# Patient Record
Sex: Female | Born: 1950
Health system: Southern US, Community
[De-identification: ages and names within clinical notes are randomized; demographics above are authoritative.]

## PROBLEM LIST (undated history)

## (undated) DIAGNOSIS — I340 Nonrheumatic mitral (valve) insufficiency: Secondary | ICD-10-CM

## (undated) DIAGNOSIS — J449 Chronic obstructive pulmonary disease, unspecified: Secondary | ICD-10-CM

## (undated) DIAGNOSIS — I10 Essential (primary) hypertension: Secondary | ICD-10-CM

## (undated) DIAGNOSIS — M25519 Pain in unspecified shoulder: Secondary | ICD-10-CM

## (undated) DIAGNOSIS — I358 Other nonrheumatic aortic valve disorders: Secondary | ICD-10-CM

## (undated) DIAGNOSIS — I6529 Occlusion and stenosis of unspecified carotid artery: Secondary | ICD-10-CM

## (undated) DIAGNOSIS — G479 Sleep disorder, unspecified: Secondary | ICD-10-CM

## (undated) DIAGNOSIS — E782 Mixed hyperlipidemia: Secondary | ICD-10-CM

## (undated) DIAGNOSIS — R0989 Other specified symptoms and signs involving the circulatory and respiratory systems: Secondary | ICD-10-CM

## (undated) DIAGNOSIS — F419 Anxiety disorder, unspecified: Secondary | ICD-10-CM

## (undated) HISTORY — DX: Sleep disorder, unspecified: G47.9

## (undated) HISTORY — DX: Essential (primary) hypertension: I10

## (undated) HISTORY — DX: Occlusion and stenosis of unspecified carotid artery: I65.29

## (undated) HISTORY — DX: Pain in unspecified shoulder: M25.519

## (undated) HISTORY — DX: Other specified symptoms and signs involving the circulatory and respiratory systems: R09.89

## (undated) HISTORY — DX: Anxiety disorder, unspecified: F41.9

## (undated) HISTORY — DX: Other nonrheumatic aortic valve disorders: I35.8

## (undated) HISTORY — DX: Mixed hyperlipidemia: E78.2

## (undated) HISTORY — PX: CARDIAC CATHETERIZATION: SHX172

## (undated) HISTORY — PX: EYE SURGERY: SHX253

## (undated) HISTORY — PX: CATARACT EXTRACTION: SUR2

## (undated) HISTORY — DX: Nonrheumatic mitral (valve) insufficiency: I34.0

## (undated) HISTORY — PX: CHOLECYSTECTOMY: SHX55

---

## 2004-09-18 ENCOUNTER — Ambulatory Visit: Payer: Self-pay

## 2007-12-12 ENCOUNTER — Ambulatory Visit: Payer: Self-pay | Admitting: Surgery

## 2008-07-29 ENCOUNTER — Emergency Department: Payer: Self-pay | Admitting: Emergency Medicine

## 2009-01-04 ENCOUNTER — Encounter: Payer: Self-pay | Admitting: General Practice

## 2009-01-27 ENCOUNTER — Encounter: Payer: Self-pay | Admitting: General Practice

## 2009-11-03 ENCOUNTER — Ambulatory Visit: Payer: Self-pay | Admitting: Gastroenterology

## 2010-10-29 HISTORY — PX: COLONOSCOPY: SHX174

## 2014-08-11 DIAGNOSIS — R6889 Other general symptoms and signs: Secondary | ICD-10-CM | POA: Diagnosis not present

## 2014-08-11 DIAGNOSIS — F329 Major depressive disorder, single episode, unspecified: Secondary | ICD-10-CM | POA: Diagnosis not present

## 2014-08-11 DIAGNOSIS — Z23 Encounter for immunization: Secondary | ICD-10-CM | POA: Diagnosis not present

## 2014-08-11 DIAGNOSIS — F419 Anxiety disorder, unspecified: Secondary | ICD-10-CM | POA: Diagnosis not present

## 2014-08-11 DIAGNOSIS — Z1389 Encounter for screening for other disorder: Secondary | ICD-10-CM | POA: Diagnosis not present

## 2014-08-11 DIAGNOSIS — G479 Sleep disorder, unspecified: Secondary | ICD-10-CM | POA: Diagnosis not present

## 2014-08-11 DIAGNOSIS — J309 Allergic rhinitis, unspecified: Secondary | ICD-10-CM | POA: Diagnosis not present

## 2014-08-11 DIAGNOSIS — Z9181 History of falling: Secondary | ICD-10-CM | POA: Diagnosis not present

## 2014-09-01 DIAGNOSIS — Z1389 Encounter for screening for other disorder: Secondary | ICD-10-CM | POA: Diagnosis not present

## 2014-09-01 DIAGNOSIS — R6889 Other general symptoms and signs: Secondary | ICD-10-CM | POA: Diagnosis not present

## 2014-09-01 DIAGNOSIS — M25511 Pain in right shoulder: Secondary | ICD-10-CM | POA: Diagnosis not present

## 2014-09-01 DIAGNOSIS — F419 Anxiety disorder, unspecified: Secondary | ICD-10-CM | POA: Diagnosis not present

## 2014-09-01 DIAGNOSIS — F329 Major depressive disorder, single episode, unspecified: Secondary | ICD-10-CM | POA: Diagnosis not present

## 2014-09-01 DIAGNOSIS — I10 Essential (primary) hypertension: Secondary | ICD-10-CM | POA: Diagnosis not present

## 2014-09-01 DIAGNOSIS — G479 Sleep disorder, unspecified: Secondary | ICD-10-CM | POA: Diagnosis not present

## 2014-09-01 DIAGNOSIS — J069 Acute upper respiratory infection, unspecified: Secondary | ICD-10-CM | POA: Diagnosis not present

## 2014-09-21 DIAGNOSIS — R0989 Other specified symptoms and signs involving the circulatory and respiratory systems: Secondary | ICD-10-CM | POA: Diagnosis not present

## 2014-09-21 DIAGNOSIS — G479 Sleep disorder, unspecified: Secondary | ICD-10-CM | POA: Diagnosis not present

## 2014-09-21 DIAGNOSIS — F329 Major depressive disorder, single episode, unspecified: Secondary | ICD-10-CM | POA: Diagnosis not present

## 2014-09-21 DIAGNOSIS — M47812 Spondylosis without myelopathy or radiculopathy, cervical region: Secondary | ICD-10-CM | POA: Diagnosis not present

## 2014-09-21 DIAGNOSIS — I10 Essential (primary) hypertension: Secondary | ICD-10-CM | POA: Diagnosis not present

## 2014-09-21 DIAGNOSIS — K121 Other forms of stomatitis: Secondary | ICD-10-CM | POA: Diagnosis not present

## 2014-09-21 DIAGNOSIS — F419 Anxiety disorder, unspecified: Secondary | ICD-10-CM | POA: Diagnosis not present

## 2014-11-09 DIAGNOSIS — K121 Other forms of stomatitis: Secondary | ICD-10-CM | POA: Diagnosis not present

## 2014-11-09 DIAGNOSIS — I10 Essential (primary) hypertension: Secondary | ICD-10-CM | POA: Diagnosis not present

## 2014-11-16 DIAGNOSIS — M25511 Pain in right shoulder: Secondary | ICD-10-CM | POA: Diagnosis not present

## 2014-11-16 DIAGNOSIS — E782 Mixed hyperlipidemia: Secondary | ICD-10-CM | POA: Diagnosis not present

## 2014-11-16 DIAGNOSIS — F329 Major depressive disorder, single episode, unspecified: Secondary | ICD-10-CM | POA: Diagnosis not present

## 2014-11-16 DIAGNOSIS — F419 Anxiety disorder, unspecified: Secondary | ICD-10-CM | POA: Diagnosis not present

## 2014-11-16 DIAGNOSIS — I10 Essential (primary) hypertension: Secondary | ICD-10-CM | POA: Diagnosis not present

## 2014-11-16 DIAGNOSIS — R0989 Other specified symptoms and signs involving the circulatory and respiratory systems: Secondary | ICD-10-CM | POA: Diagnosis not present

## 2014-11-16 DIAGNOSIS — G479 Sleep disorder, unspecified: Secondary | ICD-10-CM | POA: Diagnosis not present

## 2014-11-16 DIAGNOSIS — Z1389 Encounter for screening for other disorder: Secondary | ICD-10-CM | POA: Diagnosis not present

## 2014-12-14 DIAGNOSIS — R300 Dysuria: Secondary | ICD-10-CM | POA: Diagnosis not present

## 2015-01-19 DIAGNOSIS — F419 Anxiety disorder, unspecified: Secondary | ICD-10-CM | POA: Diagnosis not present

## 2015-01-19 DIAGNOSIS — M62830 Muscle spasm of back: Secondary | ICD-10-CM | POA: Diagnosis not present

## 2015-01-19 DIAGNOSIS — E782 Mixed hyperlipidemia: Secondary | ICD-10-CM | POA: Diagnosis not present

## 2015-01-19 DIAGNOSIS — G479 Sleep disorder, unspecified: Secondary | ICD-10-CM | POA: Diagnosis not present

## 2015-01-19 DIAGNOSIS — I10 Essential (primary) hypertension: Secondary | ICD-10-CM | POA: Diagnosis not present

## 2015-01-19 DIAGNOSIS — Z9181 History of falling: Secondary | ICD-10-CM | POA: Diagnosis not present

## 2015-01-19 DIAGNOSIS — F329 Major depressive disorder, single episode, unspecified: Secondary | ICD-10-CM | POA: Diagnosis not present

## 2015-01-19 DIAGNOSIS — J309 Allergic rhinitis, unspecified: Secondary | ICD-10-CM | POA: Diagnosis not present

## 2015-04-13 ENCOUNTER — Other Ambulatory Visit: Payer: Self-pay | Admitting: Family Medicine

## 2015-04-13 MED ORDER — IBUPROFEN 600 MG PO TABS: 600.0000 mg | ORAL_TABLET | Freq: Four times a day (QID) | ORAL | Status: DC | PRN

## 2015-04-13 MED ORDER — SERTRALINE HCL 50 MG PO TABS: 50.0000 mg | ORAL_TABLET | Freq: Every day | ORAL | Status: DC

## 2015-04-13 MED ORDER — METOPROLOL TARTRATE 25 MG PO TABS: 25.0000 mg | ORAL_TABLET | Freq: Two times a day (BID) | ORAL | Status: DC

## 2015-05-31 ENCOUNTER — Other Ambulatory Visit: Payer: Self-pay | Admitting: Family Medicine

## 2015-05-31 MED ORDER — SERTRALINE HCL 50 MG PO TABS: ORAL_TABLET | ORAL | Status: DC

## 2015-06-12 ENCOUNTER — Other Ambulatory Visit: Payer: Self-pay | Admitting: Family Medicine

## 2015-07-07 ENCOUNTER — Encounter: Payer: Self-pay | Admitting: *Deleted

## 2015-07-07 ENCOUNTER — Other Ambulatory Visit: Payer: Self-pay | Admitting: *Deleted

## 2015-07-08 ENCOUNTER — Ambulatory Visit (INDEPENDENT_AMBULATORY_CARE_PROVIDER_SITE_OTHER): Payer: Commercial Managed Care - HMO | Admitting: Family Medicine

## 2015-07-08 ENCOUNTER — Encounter: Payer: Self-pay | Admitting: Family Medicine

## 2015-07-08 ENCOUNTER — Encounter (INDEPENDENT_AMBULATORY_CARE_PROVIDER_SITE_OTHER): Payer: Self-pay

## 2015-07-08 VITALS — BP 160/70 | HR 54 | Temp 98.2°F | Resp 16 | Ht 67.0 in | Wt 148.8 lb

## 2015-07-08 DIAGNOSIS — F329 Major depressive disorder, single episode, unspecified: Secondary | ICD-10-CM

## 2015-07-08 DIAGNOSIS — E785 Hyperlipidemia, unspecified: Secondary | ICD-10-CM

## 2015-07-08 DIAGNOSIS — I10 Essential (primary) hypertension: Secondary | ICD-10-CM | POA: Diagnosis not present

## 2015-07-08 DIAGNOSIS — J302 Other seasonal allergic rhinitis: Secondary | ICD-10-CM | POA: Diagnosis not present

## 2015-07-08 DIAGNOSIS — F32A Depression, unspecified: Secondary | ICD-10-CM

## 2015-07-08 MED ORDER — FLUTICASONE PROPIONATE 50 MCG/ACT NA SUSP: 1.0000 | Freq: Every day | NASAL | Status: DC

## 2015-07-08 MED ORDER — SERTRALINE HCL 100 MG PO TABS: 100.0000 mg | ORAL_TABLET | Freq: Every day | ORAL | Status: DC

## 2015-07-08 MED ORDER — LOSARTAN POTASSIUM 100 MG PO TABS: 100.0000 mg | ORAL_TABLET | Freq: Every day | ORAL | Status: DC

## 2015-07-08 NOTE — Progress Notes (Signed)
Name: Kristin Parker   MRN: [id number]    DOB: 1950/12/17   Date:07/08/2015       Progress Note  Subjective  Chief Complaint  Chief Complaint  Patient presents with  . Hypertension    HPI  Here to f/u HBP.  C/o tongue tingling in the evening that she relates to Simvastatin.  Feels that depression is  Not as well controlled as in the past.  Wishes higher dose.  Feels 6-7/10 overall.  Esp. Has trouble sleeping falling asleep.  Needs refill of  Flonase NS.  No problem-specific assessment & plan notes found for this encounter.   Past Medical History  Diagnosis Date  . Depression   . Anxiety   . Mixed hyperlipidemia   . Shoulder pain   . Bilateral carotid bruits   . Sleeping difficulties     Social History  Substance Use Topics  . Smoking status: Current Every Day Smoker -- 0.50 packs/day for 30 years    Types: Cigarettes  . Smokeless tobacco: Never Used  . Alcohol Use: No     Current outpatient prescriptions:  .  baclofen (LIORESAL) 10 MG tablet, Take 10 mg by mouth as needed., Disp: , Rfl: 0 .  fluticasone (FLONASE) 50 MCG/ACT nasal spray, Place 1 spray into both nostrils daily., Disp: , Rfl:  .  ibuprofen (ADVIL,MOTRIN) 600 MG tablet, Take 1 tablet (600 mg total) by mouth every 6 (six) hours as needed., Disp: 120 tablet, Rfl: 3 .  losartan (COZAAR) 50 MG tablet, Take 50 mg by mouth daily., Disp: , Rfl: 0 .  metoprolol tartrate (LOPRESSOR) 25 MG tablet, Take 1 tablet (25 mg total) by mouth 2 (two) times daily., Disp: 180 tablet, Rfl: 3 .  sertraline (ZOLOFT) 50 MG tablet, Take  1.5 tablets daily, Disp: 60 tablet, Rfl: 0 .  simvastatin (ZOCOR) 20 MG tablet, take 1 tablet by mouth at bedtime, Disp: 30 tablet, Rfl: 6  Allergies  Allergen Reactions  . Aleve [Naproxen Sodium] Swelling    Lips     Review of Systems  Constitutional: Positive for malaise/fatigue. Negative for fever and chills.  HENT: Negative for hearing loss.   Eyes: Negative for blurred vision and  double vision.  Respiratory: Negative for cough, sputum production, shortness of breath and wheezing.   Cardiovascular: Negative for chest pain, palpitations, orthopnea and leg swelling.  Gastrointestinal: Negative for heartburn, abdominal pain and blood in stool.  Genitourinary: Negative for dysuria, urgency and frequency.  Musculoskeletal: Negative for myalgias and joint pain.  Skin: Negative for rash.  Neurological: Negative for dizziness, sensory change, focal weakness and headaches.  Psychiatric/Behavioral: Positive for depression.      Objective  Filed Vitals:   07/08/15 0948  BP: 166/73  Pulse: 53  Temp: 98.2 F (36.8 C)  TempSrc: Oral  Resp: 16  Height: 5\' 7"  (1.702 m)  Weight: 148 lb 12.8 oz (67.495 kg)     Physical Exam  Constitutional: She is oriented to person, place, and time and well-developed, well-nourished, and in no distress. No distress.  HENT:  Head: Normocephalic and atraumatic.  Eyes: Conjunctivae and EOM are normal. Pupils are equal, round, and reactive to light. No scleral icterus.  Neck: Normal range of motion. Neck supple. Carotid bruit is not present. No thyromegaly present.  Cardiovascular: Normal rate, regular rhythm, normal heart sounds and intact distal pulses.  Exam reveals no gallop and no friction rub.   No murmur heard. Pulmonary/Chest: Effort normal and breath sounds normal. No respiratory  distress. She has no wheezes. She has no rales.  Abdominal: Soft. Bowel sounds are normal. She exhibits no distension, no abdominal bruit and no mass. There is no tenderness.  Musculoskeletal: She exhibits no edema.  Lymphadenopathy:    She has no cervical adenopathy.  Neurological: She is alert and oriented to person, place, and time.  Psychiatric:  Affect sl. Depressed/  Vitals reviewed.     No results found for this or any previous visit (from the past 2160 hour(s)).   Assessment & Plan  1. Essential hypertension  - losartan (COZAAR) 100  MG tablet; Take 1 tablet (100 mg total) by mouth daily.  Dispense: 30 tablet; Refill: 12  2. Depression  - sertraline (ZOLOFT) 100 MG tablet; Take 1 tablet (100 mg total) by mouth daily.  Dispense: 30 tablet; Refill: 6  3. Hyperlipidemia -stop Simvastatin for 1-2 weeks and then restart to see if tingling of tongue returns  4. Seasonal allergies  - fluticasone (FLONASE) 50 MCG/ACT nasal spray; Place 1 spray into both nostrils daily.  Dispense: 16 g; Refill: 12

## 2015-07-08 NOTE — Patient Instructions (Signed)
Patient declines flu shot.  Patient declines mammogram.  In crease dose of meds as indicated and con\t. other meds at currret doses.

## 2015-08-10 ENCOUNTER — Other Ambulatory Visit: Payer: Self-pay | Admitting: Family Medicine

## 2015-08-18 ENCOUNTER — Ambulatory Visit (INDEPENDENT_AMBULATORY_CARE_PROVIDER_SITE_OTHER): Payer: Commercial Managed Care - HMO | Admitting: Family Medicine

## 2015-08-18 ENCOUNTER — Encounter: Payer: Self-pay | Admitting: Family Medicine

## 2015-08-18 VITALS — BP 135/65 | HR 57 | Temp 98.1°F | Resp 16 | Ht 67.0 in | Wt 152.2 lb

## 2015-08-18 DIAGNOSIS — I10 Essential (primary) hypertension: Secondary | ICD-10-CM | POA: Diagnosis not present

## 2015-08-18 DIAGNOSIS — R0989 Other specified symptoms and signs involving the circulatory and respiratory systems: Secondary | ICD-10-CM | POA: Diagnosis not present

## 2015-08-18 DIAGNOSIS — F419 Anxiety disorder, unspecified: Secondary | ICD-10-CM

## 2015-08-18 DIAGNOSIS — E785 Hyperlipidemia, unspecified: Secondary | ICD-10-CM | POA: Diagnosis not present

## 2015-08-18 DIAGNOSIS — J302 Other seasonal allergic rhinitis: Secondary | ICD-10-CM | POA: Diagnosis not present

## 2015-08-18 NOTE — Progress Notes (Signed)
Name: Kristin Parker   MRN: [id number]    DOB: 1951/10/26   Date:08/18/2015       Progress Note  Subjective  Chief Complaint  Chief Complaint  Patient presents with  . Hypertension    HPI Here to f/u HBP.  Taking meds. Has restarted lipid meds.  Feeling pretty good overall.  Stiull having some burning of tongue and salty taaste in mouth.  No problem-specific assessment & plan notes found for this encounter.   Past Medical History  Diagnosis Date  . Depression   . Anxiety   . Mixed hyperlipidemia   . Shoulder pain   . Bilateral carotid bruits   . Sleeping difficulties     Social History  Substance Use Topics  . Smoking status: Current Every Day Smoker -- 0.50 packs/day for 30 years    Types: Cigarettes  . Smokeless tobacco: Never Used  . Alcohol Use: No     Current outpatient prescriptions:  .  baclofen (LIORESAL) 10 MG tablet, take 1 tablet by mouth three times a day if needed, Disp: 60 tablet, Rfl: 0 .  fluticasone (FLONASE) 50 MCG/ACT nasal spray, Place 1 spray into both nostrils daily., Disp: 16 g, Rfl: 12 .  ibuprofen (ADVIL,MOTRIN) 600 MG tablet, take 1 tablet by mouth every 6 hours if needed, Disp: 120 tablet, Rfl: 0 .  losartan (COZAAR) 100 MG tablet, Take 1 tablet (100 mg total) by mouth daily., Disp: 30 tablet, Rfl: 12 .  metoprolol tartrate (LOPRESSOR) 25 MG tablet, Take 1 tablet (25 mg total) by mouth 2 (two) times daily., Disp: 180 tablet, Rfl: 3 .  sertraline (ZOLOFT) 100 MG tablet, Take 1 tablet (100 mg total) by mouth daily., Disp: 30 tablet, Rfl: 6 .  simvastatin (ZOCOR) 20 MG tablet, take 1 tablet by mouth at bedtime, Disp: 30 tablet, Rfl: 6  Allergies  Allergen Reactions  . Aleve [Naproxen Sodium] Swelling    Lips     Review of Systems  Constitutional: Negative for fever, chills, weight loss and malaise/fatigue.  HENT: Negative for hearing loss.   Eyes: Negative for blurred vision and double vision.  Respiratory: Negative for cough, shortness  of breath and wheezing.   Cardiovascular: Negative for chest pain, palpitations, orthopnea and leg swelling.  Gastrointestinal: Negative for heartburn, abdominal pain and blood in stool.  Genitourinary: Positive for dysuria, urgency and frequency.  Neurological: Negative for weakness and headaches.      Objective  Filed Vitals:   08/18/15 1049  BP: 151/80  Pulse: 57  Temp: 98.1 F (36.7 C)  TempSrc: Oral  Resp: 16  Height: 5\' 7"  (1.702 m)  Weight: 152 lb 3.2 oz (69.037 kg)     Physical Exam  Constitutional: She is oriented to person, place, and time and well-developed, well-nourished, and in no distress. No distress.  HENT:  Head: Normocephalic and atraumatic.  Eyes: Conjunctivae and EOM are normal. Pupils are equal, round, and reactive to light. No scleral icterus.  Neck: Normal range of motion. Neck supple. Carotid bruit is present (bilateral mild bruits). No thyromegaly present.  Cardiovascular: Normal rate and regular rhythm.  Exam reveals no gallop and no friction rub.   Murmur heard.  Systolic murmur is present with a grade of 2/6  URSB  Pulmonary/Chest: Effort normal and breath sounds normal. No respiratory distress. She has no wheezes. She has no rales.  Abdominal: Soft. Bowel sounds are normal. She exhibits no distension and no mass. There is no tenderness.  Musculoskeletal: She exhibits  no edema.  Lymphadenopathy:    She has no cervical adenopathy.  Neurological: She is alert and oriented to person, place, and time.  Vitals reviewed.     No results found for this or any previous visit (from the past 2160 hour(s)).   Assessment & Plan  1. Essential hypertension   2. Hyperlipidemia   3. Chronic anxiety   4. Seasonal allergies   5. Bilateral carotid bruits

## 2015-08-18 NOTE — Patient Instructions (Addendum)
Plan CBC, CMP. Lipid panel and TSH on return.  Continue all current meds.  Patient declines flu shot.

## 2015-08-23 ENCOUNTER — Other Ambulatory Visit: Payer: Self-pay | Admitting: Family Medicine

## 2015-12-22 ENCOUNTER — Encounter: Payer: Self-pay | Admitting: Family Medicine

## 2015-12-22 ENCOUNTER — Ambulatory Visit (INDEPENDENT_AMBULATORY_CARE_PROVIDER_SITE_OTHER): Payer: Medicare HMO | Admitting: Family Medicine

## 2015-12-22 VITALS — BP 124/63 | HR 58 | Resp 16 | Ht 72.0 in | Wt 153.6 lb

## 2015-12-22 DIAGNOSIS — R350 Frequency of micturition: Secondary | ICD-10-CM

## 2015-12-22 DIAGNOSIS — N3000 Acute cystitis without hematuria: Secondary | ICD-10-CM | POA: Insufficient documentation

## 2015-12-22 LAB — POCT URINALYSIS DIPSTICK
Bilirubin, UA: NEGATIVE
Glucose, UA: NEGATIVE
KETONES UA: NEGATIVE
Nitrite, UA: POSITIVE
PH UA: 6.5
PROTEIN UA: NEGATIVE
SPEC GRAV UA: 1.025
UROBILINOGEN UA: 0.2

## 2015-12-22 MED ORDER — SULFAMETHOXAZOLE-TRIMETHOPRIM 800-160 MG PO TABS: 1.0000 | ORAL_TABLET | Freq: Two times a day (BID) | ORAL | Status: AC

## 2015-12-22 NOTE — Progress Notes (Signed)
Name: Kristin Parker   MRN: [id number]    DOB: 1951/02/09   Date:12/22/2015       Progress Note  Subjective  Chief Complaint  Chief Complaint  Patient presents with  . Nocturia  . Hypertension    HPI C/o urinary frequency and incomplete emptying.  No dysuria.  Sx. X 2 weeks.   No feverf, abdominal pain, N/V.  No problem-specific assessment & plan notes found for this encounter.   Past Medical History  Diagnosis Date  . Depression   . Anxiety   . Mixed hyperlipidemia   . Shoulder pain   . Bilateral carotid bruits   . Sleeping difficulties     Social History  Substance Use Topics  . Smoking status: Current Every Day Smoker -- 0.50 packs/day for 30 years    Types: Cigarettes  . Smokeless tobacco: Never Used  . Alcohol Use: No     Current outpatient prescriptions:  .  baclofen (LIORESAL) 10 MG tablet, take 1 tablet by mouth three times a day if needed, Disp: 60 tablet, Rfl: 3 .  fluticasone (FLONASE) 50 MCG/ACT nasal spray, Place 1 spray into both nostrils daily., Disp: 16 g, Rfl: 12 .  ibuprofen (ADVIL,MOTRIN) 600 MG tablet, take 1 tablet by mouth every 6 hours if needed, Disp: 120 tablet, Rfl: 3 .  losartan (COZAAR) 100 MG tablet, Take 1 tablet (100 mg total) by mouth daily., Disp: 30 tablet, Rfl: 12 .  metoprolol tartrate (LOPRESSOR) 25 MG tablet, Take 1 tablet (25 mg total) by mouth 2 (two) times daily., Disp: 180 tablet, Rfl: 3 .  sertraline (ZOLOFT) 100 MG tablet, Take 1 tablet (100 mg total) by mouth daily., Disp: 30 tablet, Rfl: 6 .  simvastatin (ZOCOR) 20 MG tablet, take 1 tablet by mouth at bedtime, Disp: 30 tablet, Rfl: 6 .  sulfamethoxazole-trimethoprim (BACTRIM DS,SEPTRA DS) 800-160 MG tablet, Take 1 tablet by mouth 2 (two) times daily., Disp: 14 tablet, Rfl: 0  Allergies  Allergen Reactions  . Aleve [Naproxen Sodium] Swelling    Lips     Review of Systems  Constitutional: Negative for fever, chills, weight loss and malaise/fatigue.  HENT: Negative for  hearing loss.   Eyes: Negative for blurred vision and double vision.  Respiratory: Negative for cough, shortness of breath and wheezing.   Cardiovascular: Negative for chest pain, palpitations and leg swelling.  Gastrointestinal: Negative for heartburn, abdominal pain and blood in stool.  Genitourinary: Positive for urgency and frequency. Negative for dysuria.  Musculoskeletal: Negative for myalgias and joint pain.  Skin: Negative for rash.  Neurological: Negative for weakness and headaches.      Objective  Filed Vitals:   12/22/15 1121  BP: 124/63  Pulse: 58  Resp: 16  Height: 6' (1.829 m)  Weight: 153 lb 9.6 oz (69.673 kg)  SpO2: 95%     Physical Exam  Constitutional: She is oriented to person, place, and time and well-developed, well-nourished, and in no distress. No distress.  HENT:  Head: Normocephalic and atraumatic.  Abdominal: Soft. Bowel sounds are normal. She exhibits no distension and no mass. There is no tenderness. There is no rebound and no guarding.  No CVA tenderness  Neurological: She is alert and oriented to person, place, and time.  Vitals reviewed.     Recent Results (from the past 2160 hour(s))  POCT urinalysis dipstick     Status: Abnormal   Collection Time: 12/22/15 11:16 AM  Result Value Ref Range   Color, UA yellow  Clarity, UA cloudy    Glucose, UA neg    Bilirubin, UA neg    Ketones, UA neg    Spec Grav, UA 1.025    Blood, UA smalll    pH, UA 6.5    Protein, UA neg    Urobilinogen, UA 0.2    Nitrite, UA pos    Leukocytes, UA moderate (2+) (A) Negative     Assessment & Plan  1. Urine frequency  - POCT urinalysis dipstick-++ leukocytes and mod Nitrites 2. Acute cystitis without hematuria  - sulfamethoxazole-trimethoprim (BACTRIM DS,SEPTRA DS) 800-160 MG tablet; Take 1 tablet by mouth 2 (two) times daily.  Dispense: 14 tablet; Refill: 0

## 2016-01-09 ENCOUNTER — Other Ambulatory Visit: Payer: Self-pay | Admitting: Family Medicine

## 2016-01-09 DIAGNOSIS — F32A Depression, unspecified: Secondary | ICD-10-CM

## 2016-01-09 DIAGNOSIS — F329 Major depressive disorder, single episode, unspecified: Secondary | ICD-10-CM

## 2016-01-09 MED ORDER — SERTRALINE HCL 100 MG PO TABS: 100.0000 mg | ORAL_TABLET | Freq: Every day | ORAL | Status: DC

## 2016-01-09 MED ORDER — SIMVASTATIN 20 MG PO TABS: 20.0000 mg | ORAL_TABLET | Freq: Every day | ORAL | Status: DC

## 2016-01-09 MED ORDER — IBUPROFEN 600 MG PO TABS: 600.0000 mg | ORAL_TABLET | Freq: Three times a day (TID) | ORAL | Status: DC

## 2016-01-10 ENCOUNTER — Ambulatory Visit (INDEPENDENT_AMBULATORY_CARE_PROVIDER_SITE_OTHER): Payer: Commercial Managed Care - HMO | Admitting: Family Medicine

## 2016-01-10 ENCOUNTER — Encounter: Payer: Self-pay | Admitting: Family Medicine

## 2016-01-10 VITALS — BP 124/76 | HR 51 | Temp 98.1°F | Resp 16 | Ht 67.0 in | Wt 156.0 lb

## 2016-01-10 DIAGNOSIS — R350 Frequency of micturition: Secondary | ICD-10-CM

## 2016-01-10 LAB — POCT URINALYSIS DIPSTICK
BILIRUBIN UA: NEGATIVE
Glucose, UA: NEGATIVE
KETONES UA: NEGATIVE
LEUKOCYTES UA: NEGATIVE
Nitrite, UA: NEGATIVE
PH UA: 6.5
PROTEIN UA: NEGATIVE
Urobilinogen, UA: NEGATIVE

## 2016-01-10 MED ORDER — CIPROFLOXACIN HCL 500 MG PO TABS: 500.0000 mg | ORAL_TABLET | Freq: Two times a day (BID) | ORAL | Status: DC

## 2016-01-10 NOTE — Addendum Note (Signed)
Addended by: Frederich Cha D on: 01/10/2016 11:00 AM   Modules accepted: Miquel Dunn

## 2016-01-10 NOTE — Assessment & Plan Note (Signed)
UA dipstick not overwhelming for infection, however given incomplete resolution of symptoms will treat with Cipro. Culture urine. Alarm symptoms reviewed. Consider urology referral for microscopic hematuria if not resolved.

## 2016-01-10 NOTE — Progress Notes (Signed)
Subjective:    Patient ID: Kristin Parker, female    DOB: June 21, 1951, 65 y.o.   MRN: [id number]  HPI: Kristin Parker is a 65 y.o. female presenting on 01/10/2016 for Urinary Tract Infection   HPI  Pt presents for possible UTI. Seen on 2/23 for possible UTI. Treated with bactrim x 7 days. Symptoms at that time were urinary frequency. Symptoms never resolved with abx. Now reporting low back pain radiating to front 4/10. No blood in the urine.  Still having urinary frequency- not completely emptying. Burning after she voids. No vaginal discharge or bleeding. Occasional nausea. No fever. No chills. No hematuria. Urine culture was not done with previous UTI.   Past Medical History  Diagnosis Date  . Depression   . Anxiety   . Mixed hyperlipidemia   . Shoulder pain   . Bilateral carotid bruits   . Sleeping difficulties     Current Outpatient Prescriptions on File Prior to Visit  Medication Sig  . baclofen (LIORESAL) 10 MG tablet take 1 tablet by mouth three times a day if needed  . fluticasone (FLONASE) 50 MCG/ACT nasal spray Place 1 spray into both nostrils daily.  Marland Kitchen ibuprofen (ADVIL,MOTRIN) 600 MG tablet Take 1 tablet (600 mg total) by mouth 3 (three) times daily.  Marland Kitchen losartan (COZAAR) 100 MG tablet Take 1 tablet (100 mg total) by mouth daily.  . metoprolol tartrate (LOPRESSOR) 25 MG tablet Take 1 tablet (25 mg total) by mouth 2 (two) times daily.  . sertraline (ZOLOFT) 100 MG tablet Take 1 tablet (100 mg total) by mouth daily.  . simvastatin (ZOCOR) 20 MG tablet Take 1 tablet (20 mg total) by mouth at bedtime.   No current facility-administered medications on file prior to visit.    Review of Systems  Constitutional: Negative for fever and chills.  HENT: Negative.   Respiratory: Negative for cough, chest tightness and wheezing.   Cardiovascular: Negative for chest pain and leg swelling.  Gastrointestinal: Positive for nausea. Negative for vomiting, abdominal pain, diarrhea  and constipation.  Endocrine: Negative.  Negative for cold intolerance, heat intolerance, polydipsia, polyphagia and polyuria.  Genitourinary: Positive for dysuria, frequency, flank pain and pelvic pain. Negative for urgency, hematuria, decreased urine volume, vaginal bleeding, vaginal discharge, difficulty urinating and vaginal pain.  Musculoskeletal: Negative.   Neurological: Negative for dizziness, light-headedness and numbness.  Psychiatric/Behavioral: Negative.    Per HPI unless specifically indicated above     Objective:    BP 124/76 mmHg  Pulse 51  Temp(Src) 98.1 F (36.7 C) (Oral)  Resp 16  Ht 5\' 7"  (1.702 m)  Wt 156 lb (70.761 kg)  BMI 24.43 kg/m2  Wt Readings from Last 3 Encounters:  01/10/16 156 lb (70.761 kg)  12/22/15 153 lb 9.6 oz (69.673 kg)  08/18/15 152 lb 3.2 oz (69.037 kg)    Physical Exam  Constitutional: She is oriented to person, place, and time. She appears well-developed and well-nourished. No distress.  HENT:  Head: Normocephalic and atraumatic.  Cardiovascular: Normal rate and regular rhythm.  Exam reveals no gallop and no friction rub.   No murmur heard. Pulmonary/Chest: Effort normal and breath sounds normal. No respiratory distress.  Abdominal: Soft. Normal appearance. There is no hepatosplenomegaly, splenomegaly or hepatomegaly. There is tenderness in the suprapubic area. There is no CVA tenderness.  Neurological: She is alert and oriented to person, place, and time. No cranial nerve deficit. Coordination normal.  Skin: She is not diaphoretic.  Psychiatric: Her behavior is normal.  Results for orders placed or performed in visit on 01/10/16  POCT Urinalysis Dipstick  Result Value Ref Range   Color, UA yellow    Clarity, UA clear    Glucose, UA neg    Bilirubin, UA neg    Ketones, UA neg    Spec Grav, UA <=1.005    Blood, UA trace    pH, UA 6.5    Protein, UA neg    Urobilinogen, UA negative    Nitrite, UA neg    Leukocytes, UA Negative  Negative      Assessment & Plan:   Problem List Items Addressed This Visit      Other   Urine frequency - Primary    UA dipstick not overwhelming for infection, however given incomplete resolution of symptoms will treat with Cipro. Culture urine. Alarm symptoms reviewed. Consider urology referral for microscopic hematuria if not resolved.       Relevant Medications   ciprofloxacin (CIPRO) 500 MG tablet   Other Relevant Orders   POCT Urinalysis Dipstick (Completed)   CULTURE, URINE COMPREHENSIVE      Meds ordered this encounter  Medications  . ciprofloxacin (CIPRO) 500 MG tablet    Sig: Take 1 tablet (500 mg total) by mouth 2 (two) times daily.    Dispense:  10 tablet    Refill:  0    Order Specific Question:  Supervising Provider    Answer:  Arlis Porta [id number]      Follow up plan: Return if symptoms worsen or fail to improve.

## 2016-01-10 NOTE — Patient Instructions (Signed)
I think your UTI never completely resolved. We will treat for a urinary tract infection today. Please take your antibiotic as directed. If you develop severe flank pain, blood in the urine, fever, nausea or vomiting, please seek immediate medical attention in the ER.   Please let us know if you symptoms don't resolve.

## 2016-01-12 ENCOUNTER — Other Ambulatory Visit: Payer: Self-pay

## 2016-01-12 DIAGNOSIS — I10 Essential (primary) hypertension: Secondary | ICD-10-CM

## 2016-01-12 MED ORDER — METOPROLOL TARTRATE 25 MG PO TABS: 25.0000 mg | ORAL_TABLET | Freq: Two times a day (BID) | ORAL | Status: DC

## 2016-01-13 LAB — CULTURE, URINE COMPREHENSIVE

## 2016-03-09 ENCOUNTER — Other Ambulatory Visit: Payer: Self-pay | Admitting: Family Medicine

## 2016-03-09 DIAGNOSIS — F32A Depression, unspecified: Secondary | ICD-10-CM

## 2016-03-09 DIAGNOSIS — F329 Major depressive disorder, single episode, unspecified: Secondary | ICD-10-CM

## 2016-03-09 MED ORDER — SERTRALINE HCL 100 MG PO TABS: 100.0000 mg | ORAL_TABLET | Freq: Every day | ORAL | Status: DC

## 2016-03-09 MED ORDER — SIMVASTATIN 20 MG PO TABS: 20.0000 mg | ORAL_TABLET | Freq: Every day | ORAL | Status: DC

## 2016-03-09 MED ORDER — IBUPROFEN 600 MG PO TABS: 600.0000 mg | ORAL_TABLET | Freq: Three times a day (TID) | ORAL | Status: DC

## 2016-03-23 DIAGNOSIS — Z01818 Encounter for other preprocedural examination: Secondary | ICD-10-CM | POA: Diagnosis not present

## 2016-03-23 DIAGNOSIS — H2512 Age-related nuclear cataract, left eye: Secondary | ICD-10-CM | POA: Diagnosis not present

## 2016-03-23 DIAGNOSIS — H2511 Age-related nuclear cataract, right eye: Secondary | ICD-10-CM | POA: Diagnosis not present

## 2016-04-05 DIAGNOSIS — H2512 Age-related nuclear cataract, left eye: Secondary | ICD-10-CM | POA: Diagnosis not present

## 2016-04-05 DIAGNOSIS — Z888 Allergy status to other drugs, medicaments and biological substances status: Secondary | ICD-10-CM | POA: Diagnosis not present

## 2016-04-05 DIAGNOSIS — F17200 Nicotine dependence, unspecified, uncomplicated: Secondary | ICD-10-CM | POA: Diagnosis not present

## 2016-05-08 ENCOUNTER — Other Ambulatory Visit: Payer: Self-pay | Admitting: Family Medicine

## 2016-05-15 MED ORDER — IBUPROFEN 600 MG PO TABS: 600.0000 mg | ORAL_TABLET | Freq: Three times a day (TID) | ORAL | Status: DC

## 2016-06-12 ENCOUNTER — Ambulatory Visit
Admission: RE | Admit: 2016-06-12 | Discharge: 2016-06-12 | Disposition: A | Discharge status: Home / Self Care | Payer: Commercial Managed Care - HMO | Source: Ambulatory Visit | Attending: Family Medicine | Admitting: Family Medicine

## 2016-06-12 ENCOUNTER — Encounter: Payer: Self-pay | Admitting: Family Medicine

## 2016-06-12 ENCOUNTER — Ambulatory Visit (INDEPENDENT_AMBULATORY_CARE_PROVIDER_SITE_OTHER): Payer: Medicare HMO | Admitting: Family Medicine

## 2016-06-12 DIAGNOSIS — R229 Localized swelling, mass and lump, unspecified: Secondary | ICD-10-CM | POA: Insufficient documentation

## 2016-06-12 DIAGNOSIS — M179 Osteoarthritis of knee, unspecified: Secondary | ICD-10-CM | POA: Diagnosis not present

## 2016-06-12 DIAGNOSIS — M1712 Unilateral primary osteoarthritis, left knee: Secondary | ICD-10-CM | POA: Diagnosis not present

## 2016-06-12 DIAGNOSIS — M25562 Pain in left knee: Secondary | ICD-10-CM | POA: Diagnosis not present

## 2016-06-12 MED ORDER — MELOXICAM 15 MG PO TABS: 15.0000 mg | ORAL_TABLET | Freq: Every day | ORAL | 1 refills | Status: DC

## 2016-06-12 NOTE — Progress Notes (Signed)
Name: Kristin Parker   MRN: [id number]    DOB: [birth year]   Date:06/12/2016       Progress Note  Subjective  Chief Complaint  Chief Complaint  Patient presents with  . Leg Pain    also swelling x 1 day.    HPI C/o sudden onset of L leg pain yesterday that starts in post knee and hurts around to Lateral and inf knee area.  Also some pain in L calf.  Knee itself is not tender or swollen.  Has a superficial "knot" in L lateral knee area that is tender. Feels about 80% better today, but has been off it for past 24 hrs.  No problem-specific Assessment & Plan notes found for this encounter.   Past Medical History:  Diagnosis Date  . Anxiety   . Bilateral carotid bruits   . Depression   . Mixed hyperlipidemia   . Shoulder pain   . Sleeping difficulties     Social History  Substance Use Topics  . Smoking status: Current Every Day Smoker    Packs/day: 0.50    Years: 30.00    Types: Cigarettes  . Smokeless tobacco: Never Used  . Alcohol use No     Current Outpatient Prescriptions:  .  baclofen (LIORESAL) 10 MG tablet, take 1 tablet by mouth three times a day if needed, Disp: 60 tablet, Rfl: 3 .  fluticasone (FLONASE) 50 MCG/ACT nasal spray, Place 1 spray into both nostrils daily., Disp: 16 g, Rfl: 12 .  ibuprofen (ADVIL,MOTRIN) 600 MG tablet, Take 1 tablet (600 mg total) by mouth 3 (three) times daily., Disp: 120 tablet, Rfl: 1 .  losartan (COZAAR) 100 MG tablet, Take 1 tablet (100 mg total) by mouth daily., Disp: 30 tablet, Rfl: 12 .  metoprolol tartrate (LOPRESSOR) 25 MG tablet, Take 1 tablet (25 mg total) by mouth 2 (two) times daily., Disp: 180 tablet, Rfl: 3 .  sertraline (ZOLOFT) 100 MG tablet, Take 1 tablet (100 mg total) by mouth daily., Disp: 30 tablet, Rfl: 5 .  simvastatin (ZOCOR) 20 MG tablet, Take 1 tablet (20 mg total) by mouth at bedtime., Disp: 30 tablet, Rfl: 5  Allergies  Allergen Reactions  . Aleve [Naproxen Sodium] Swelling    Lips     Review of  Systems  Constitutional: Negative for chills, fever, malaise/fatigue and weight loss.  HENT: Negative.   Eyes: Negative.   Cardiovascular: Negative.   Gastrointestinal: Negative.   Genitourinary: Negative.   Musculoskeletal: Positive for joint pain and myalgias (L calf).  Skin: Negative.   Neurological: Negative.  Negative for weakness.      Objective  Vitals:   06/12/16 1038  BP: (!) 153/73  Pulse: (!) 59  Resp: 16  Temp: 98.7 F (37.1 C)  TempSrc: Oral  Weight: 150 lb (68 kg)  Height: 5\' 7"  (1.702 m)     Physical Exam  Constitutional: She is oriented to person, place, and time and well-developed, well-nourished, and in no distress. No distress.  HENT:  Head: Normocephalic and atraumatic.  Musculoskeletal:  L knee sl. Swollen compared to R.  Tenderness along medial and lateral joint lines and into bony structures.  Superficial tender nodule under skin of Lat L knee area.   OBTW-Has mass on R ant. Lower thigh .  Soft and non-tender.  Neurological: She is alert and oriented to person, place, and time.  Vitals reviewed.     No results found for this or any previous visit (from the past 2160  hour(s)).   Assessment & Plan  1. Left knee pain  - DG Knee Complete 4 Views Left; Future - meloxicam (MOBIC) 15 MG tablet; Take 1 tablet (15 mg total) by mouth daily.  Dispense: 30 tablet; Refill: 1  2. Skin mass  - Ambulatory referral to General Surgery

## 2016-06-15 ENCOUNTER — Encounter: Payer: Self-pay | Admitting: *Deleted

## 2016-06-28 ENCOUNTER — Ambulatory Visit (INDEPENDENT_AMBULATORY_CARE_PROVIDER_SITE_OTHER): Payer: Commercial Managed Care - HMO | Admitting: General Surgery

## 2016-06-28 ENCOUNTER — Encounter: Payer: Self-pay | Admitting: General Surgery

## 2016-06-28 VITALS — BP 142/82 | HR 72 | Resp 12 | Ht 67.0 in | Wt 152.0 lb

## 2016-06-28 DIAGNOSIS — D1739 Benign lipomatous neoplasm of skin and subcutaneous tissue of other sites: Secondary | ICD-10-CM | POA: Diagnosis not present

## 2016-06-28 DIAGNOSIS — L729 Follicular cyst of the skin and subcutaneous tissue, unspecified: Secondary | ICD-10-CM | POA: Diagnosis not present

## 2016-06-28 DIAGNOSIS — D1720 Benign lipomatous neoplasm of skin and subcutaneous tissue of unspecified limb: Secondary | ICD-10-CM

## 2016-06-28 NOTE — Patient Instructions (Signed)
The patient is aware to call back for any questions or concerns.  

## 2016-06-28 NOTE — Progress Notes (Signed)
Patient ID: Kristin Parker, female   DOB: 02-14-1951, 66 y.o.   MRN: [id number]  Chief Complaint  Patient presents with  . Mass    left leg    HPI Kristin Parker is a 65 y.o. female.  Here for evaluation of a mass on her left leg behind the knee. She states it has been there about 5 months. She states it is tender to touch "like a briar". She also has a knot on top of her right knee that has been there 20 years and states it is getting larger. 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 HPI  Past Medical History:  Diagnosis Date  . Anxiety   . Bilateral carotid bruits   . Depression   . Mixed hyperlipidemia   . Shoulder pain   . Sleeping difficulties     Past Surgical History:  Procedure Laterality Date  . CATARACT EXTRACTION Left    2017  . COLONOSCOPY  2012   Dr Bluford Kaufmann    Family History  Problem Relation Age of Onset  . Cancer Mother     bladder cancer  . Cancer Father     lung    Social History Social History  Substance Use Topics  . Smoking status: Current Every Day Smoker    Packs/day: 1.00    Years: 30.00    Types: Cigarettes  . Smokeless tobacco: Never Used  . Alcohol use No    Allergies  Allergen Reactions  . Aleve [Naproxen Sodium] Swelling    Lips. But patient can take Ibuprofen without problems    Current Outpatient Prescriptions  Medication Sig Dispense Refill  . diphenhydrAMINE (BENADRYL) 25 MG tablet Take 25 mg by mouth daily.    . fluticasone (FLONASE) 50 MCG/ACT nasal spray Place 1 spray into both nostrils daily. 16 g 12  . ibuprofen (ADVIL,MOTRIN) 600 MG tablet Take 1 tablet (600 mg total) by mouth 3 (three) times daily. 120 tablet 1  . losartan (COZAAR) 100 MG tablet Take 1 tablet (100 mg total) by mouth daily. 30 tablet 12  . meloxicam (MOBIC) 15 MG tablet Take 1 tablet (15 mg total) by mouth daily. 30 tablet 1  . metoprolol tartrate (LOPRESSOR) 25 MG tablet Take 1 tablet (25 mg total) by mouth 2 (two) times daily. 180 tablet 3  . sertraline  (ZOLOFT) 100 MG tablet Take 1 tablet (100 mg total) by mouth daily. 30 tablet 5  . simvastatin (ZOCOR) 20 MG tablet Take 1 tablet (20 mg total) by mouth at bedtime. 30 tablet 5   No current facility-administered medications for this visit.     Review of Systems Review of Systems  Constitutional: Negative.   Respiratory: Negative.   Cardiovascular: Negative.     Blood pressure (!) 142/82, pulse 72, resp. rate 12, height 5\' 7"  (1.702 m), weight 152 lb (68.9 kg).  Physical Exam Physical Exam  Constitutional: She is oriented to person, place, and time. She appears well-developed and well-nourished.  Eyes: Conjunctivae are normal. No scleral icterus.  Neurological: She is alert and oriented to person, place, and time.  Skin: Skin is warm and dry.  6 mm skin cyst lateral left knee. 4 x 3 cm lipoma anterior superior to the right knee. Skin cyst on her back that are not inflamed.  Psychiatric: Her behavior is normal.    Data Reviewed  Progress notes.  Assessment    Left knee skin cyst and right knee lipoma-both with off and on symptoms. Excision recommended  Plan    Recommend excision right thigh lipoma and left skin cyst at her convenience.Pt agreeable.   The patient is aware to call back for any questions or concerns.      This information has been scribed by Dorathy Daft RN, BSN,BC.   Francenia Chimenti G 06/28/2016, 10:52 AM

## 2016-07-05 ENCOUNTER — Other Ambulatory Visit: Payer: Self-pay | Admitting: Family Medicine

## 2016-07-05 DIAGNOSIS — I10 Essential (primary) hypertension: Secondary | ICD-10-CM

## 2016-07-12 ENCOUNTER — Ambulatory Visit (INDEPENDENT_AMBULATORY_CARE_PROVIDER_SITE_OTHER): Payer: Commercial Managed Care - HMO | Admitting: General Surgery

## 2016-07-12 ENCOUNTER — Encounter: Payer: Self-pay | Admitting: General Surgery

## 2016-07-12 VITALS — BP 138/78 | HR 76 | Resp 12 | Ht 67.0 in | Wt 152.0 lb

## 2016-07-12 DIAGNOSIS — D3613 Benign neoplasm of peripheral nerves and autonomic nervous system of lower limb, including hip: Secondary | ICD-10-CM | POA: Diagnosis not present

## 2016-07-12 DIAGNOSIS — D2122 Benign neoplasm of connective and other soft tissue of left lower limb, including hip: Secondary | ICD-10-CM

## 2016-07-12 DIAGNOSIS — R2242 Localized swelling, mass and lump, left lower limb: Secondary | ICD-10-CM

## 2016-07-12 DIAGNOSIS — L729 Follicular cyst of the skin and subcutaneous tissue, unspecified: Secondary | ICD-10-CM

## 2016-07-12 DIAGNOSIS — D1720 Benign lipomatous neoplasm of skin and subcutaneous tissue of unspecified limb: Secondary | ICD-10-CM

## 2016-07-12 DIAGNOSIS — R2241 Localized swelling, mass and lump, right lower limb: Secondary | ICD-10-CM

## 2016-07-12 DIAGNOSIS — D2121 Benign neoplasm of connective and other soft tissue of right lower limb, including hip: Secondary | ICD-10-CM | POA: Diagnosis not present

## 2016-07-12 DIAGNOSIS — L720 Epidermal cyst: Secondary | ICD-10-CM | POA: Diagnosis not present

## 2016-07-12 NOTE — Patient Instructions (Signed)
Return in one week nurse 

## 2016-07-12 NOTE — Progress Notes (Signed)
Patient ID: Kristin Parker, female   DOB: 30-Dec-1950, 65 y.o.   MRN: [id number]  Chief Complaint  Patient presents with  . Procedure    rigth thiugh and left knee excision    HPI Kristin Parker is a 65 y.o. female here today for a rigth knee mass and left thigh excision HPI  Past Medical History:  Diagnosis Date  . Anxiety   . Bilateral carotid bruits   . Depression   . Mixed hyperlipidemia   . Shoulder pain   . Sleeping difficulties     Past Surgical History:  Procedure Laterality Date  . CATARACT EXTRACTION Left    2017  . COLONOSCOPY  2012   Dr Bluford Kaufmann    Family History  Problem Relation Age of Onset  . Cancer Mother     bladder cancer  . Cancer Father     lung    Social History Social History  Substance Use Topics  . Smoking status: Current Every Day Smoker    Packs/day: 1.00    Years: 30.00    Types: Cigarettes  . Smokeless tobacco: Never Used  . Alcohol use No    Allergies  Allergen Reactions  . Aleve [Naproxen Sodium] Swelling    Lips. But patient can take Ibuprofen without problems    Current Outpatient Prescriptions  Medication Sig Dispense Refill  . diphenhydrAMINE (BENADRYL) 25 MG tablet Take 25 mg by mouth daily.    . fluticasone (FLONASE) 50 MCG/ACT nasal spray Place 1 spray into both nostrils daily. 16 g 12  . ibuprofen (ADVIL,MOTRIN) 600 MG tablet take 1 tablet by mouth three times a day 120 tablet 1  . losartan (COZAAR) 100 MG tablet take 1 tablet by mouth once daily 30 tablet 12  . meloxicam (MOBIC) 15 MG tablet Take 1 tablet (15 mg total) by mouth daily. 30 tablet 1  . metoprolol tartrate (LOPRESSOR) 25 MG tablet Take 1 tablet (25 mg total) by mouth 2 (two) times daily. 180 tablet 3  . sertraline (ZOLOFT) 100 MG tablet Take 1 tablet (100 mg total) by mouth daily. 30 tablet 5  . simvastatin (ZOCOR) 20 MG tablet Take 1 tablet (20 mg total) by mouth at bedtime. 30 tablet 5   No current facility-administered medications for this visit.      Review of Systems Review of Systems  Constitutional: Negative.   Respiratory: Negative.   Cardiovascular: Negative.     Blood pressure 138/78, pulse 76, resp. rate 12, height 5\' 7"  (1.702 m), weight 152 lb (68.9 kg).  Physical Exam Physical Exam  Data Reviewed Prior note  Assessment    Cutaneous cysts, right knee and left lower thigh    Plan   Procedure note  Procedure: Excision of cutaneous cysts of right knee and left lower lateral thigh with skin closure  Anesthetic: Mixture of 0.5% marcaine and 1% xylocaine, 10 ml on the right and 4 ml on the left  Prep: Skin was cleansed with chloroprep, the area was prepped and draped in sterile fashion  Description: Cutaneous cyst of right knee measuring 3.5-4 cm was excised by making a transverse incision in the center of the mass. Depth of the incision was made to the subcutaneous  tissue.  A bilobed cyst was revealed and excised by freeing from the subcutaneous skin and fat. The incision was closed in a subcuticular fashion using 3-0 vicryl sutures following with 5, simple-interrupted 4-0 nylon sutures. The cyst on the left lower lateral thigh measuring 1 cm  in size was excised by making an elliptical incision about the mass and then freeing it from the subcutaneous skin and fat. The incision was closed with 2, 4-0 nylon simple-interrupted sutures. No immediate problems from procedure. Advised pt on wound care. Rx given -Tramadol 50mg   #10. One po q6h prn  Return in two week-suture removal    This information has been scribed by Ples Specter CMA.    Gurvir Schrom G 07/12/2016, 2:41 PM

## 2016-07-17 ENCOUNTER — Ambulatory Visit (INDEPENDENT_AMBULATORY_CARE_PROVIDER_SITE_OTHER): Payer: Medicare HMO | Admitting: Family Medicine

## 2016-07-17 ENCOUNTER — Encounter: Payer: Self-pay | Admitting: Family Medicine

## 2016-07-17 VITALS — BP 160/70 | HR 55 | Temp 98.2°F | Ht 67.0 in | Wt 150.0 lb

## 2016-07-17 DIAGNOSIS — R229 Localized swelling, mass and lump, unspecified: Secondary | ICD-10-CM | POA: Diagnosis not present

## 2016-07-17 DIAGNOSIS — I10 Essential (primary) hypertension: Secondary | ICD-10-CM

## 2016-07-17 DIAGNOSIS — M25562 Pain in left knee: Secondary | ICD-10-CM | POA: Diagnosis not present

## 2016-07-17 NOTE — Progress Notes (Signed)
Name: Kristin Parker   MRN: [id number]    DOB: 1951-05-01   Date:07/17/2016       Progress Note  Subjective  Chief Complaint  Chief Complaint  Patient presents with  . Knee Pain    left f/u    HPI  Here for f/u of HBP.  Had cysts and lipoma removed from legs and knee pain has resolved.   Takes meds as directed.  Reports BP readings in 130s when checked at other offices and at home. No problem-specific Assessment & Plan notes found for this encounter.   Past Medical History:  Diagnosis Date  . Anxiety   . Bilateral carotid bruits   . Depression   . Mixed hyperlipidemia   . Shoulder pain   . Sleeping difficulties     Past Surgical History:  Procedure Laterality Date  . CATARACT EXTRACTION Left    2017  . COLONOSCOPY  2012   Dr Candace Cruise    Family History  Problem Relation Age of Onset  . Cancer Mother     bladder cancer  . Cancer Father     lung    Social History   Social History  . Marital status: Divorced    Spouse name: N/A  . Number of children: N/A  . Years of education: N/A   Occupational History  . Not on file.   Social History Main Topics  . Smoking status: Current Every Day Smoker    Packs/day: 1.00    Years: 30.00    Types: Cigarettes  . Smokeless tobacco: Never Used  . Alcohol use No  . Drug use: No  . Sexual activity: Not on file   Other Topics Concern  . Not on file   Social History Narrative  . No narrative on file     Current Outpatient Prescriptions:  .  diphenhydrAMINE (BENADRYL) 25 MG tablet, Take 25 mg by mouth daily., Disp: , Rfl:  .  fluticasone (FLONASE) 50 MCG/ACT nasal spray, Place 1 spray into both nostrils daily., Disp: 16 g, Rfl: 12 .  ibuprofen (ADVIL,MOTRIN) 600 MG tablet, take 1 tablet by mouth three times a day (Patient taking differently: take 1 tablet by mouth three times a day prn), Disp: 120 tablet, Rfl: 1 .  losartan (COZAAR) 100 MG tablet, take 1 tablet by mouth once daily, Disp: 30 tablet, Rfl: 12 .   meloxicam (MOBIC) 15 MG tablet, Take 1 tablet (15 mg total) by mouth daily. (Patient taking differently: Take 15 mg by mouth daily as needed. ), Disp: 30 tablet, Rfl: 1 .  metoprolol tartrate (LOPRESSOR) 25 MG tablet, Take 1 tablet (25 mg total) by mouth 2 (two) times daily., Disp: 180 tablet, Rfl: 3 .  sertraline (ZOLOFT) 100 MG tablet, Take 1 tablet (100 mg total) by mouth daily., Disp: 30 tablet, Rfl: 5 .  simvastatin (ZOCOR) 20 MG tablet, Take 1 tablet (20 mg total) by mouth at bedtime., Disp: 30 tablet, Rfl: 5 .  traMADol (ULTRAM) 50 MG tablet, Take 50 mg by mouth as needed., Disp: , Rfl: 0  Allergies  Allergen Reactions  . Aleve [Naproxen Sodium] Swelling    Lips. But patient can take Ibuprofen without problems     Review of Systems  Constitutional: Negative for chills, fever, malaise/fatigue and weight loss.  HENT: Negative for hearing loss.   Eyes: Negative for blurred vision and double vision.  Respiratory: Negative for cough, shortness of breath and wheezing.   Cardiovascular: Negative for chest pain, palpitations and leg  swelling.  Gastrointestinal: Negative for abdominal pain, blood in stool and heartburn.  Genitourinary: Negative for dysuria, frequency and urgency.  Musculoskeletal: Negative for joint pain and myalgias.  Skin: Negative for rash.  Neurological: Negative for dizziness, tremors, weakness and headaches.      Objective  Vitals:   07/17/16 1110 07/17/16 1143  BP: (!) 169/77 (!) 160/70  Pulse: (!) 55   Temp: 98.2 F (36.8 C)   TempSrc: Oral   Weight: 150 lb (68 kg)   Height: 5\' 7"  (1.702 m)     Physical Exam  Constitutional: She is well-developed, well-nourished, and in no distress. No distress.  HENT:  Head: Normocephalic and atraumatic.  Eyes: Conjunctivae and EOM are normal. Pupils are equal, round, and reactive to light. No scleral icterus.  Neck: Normal range of motion. Neck supple. Carotid bruit is not present. No thyromegaly present.   Cardiovascular: Normal rate, regular rhythm and normal heart sounds.  Exam reveals no gallop and no friction rub.   No murmur heard. Pulmonary/Chest: Effort normal and breath sounds normal. No respiratory distress. She has no wheezes. She exhibits no tenderness.  Musculoskeletal: She exhibits no edema.  Lymphadenopathy:    She has no cervical adenopathy.  Skin:  Skin incision sites all healing well and not tender to palpation.  Vitals reviewed.      No results found for this or any previous visit (from the past 2160 hour(s)).   Assessment & Plan  Problem List Items Addressed This Visit      Cardiovascular and Mediastinum   HBP (high blood pressure) - Primary     Other   Left knee pain   Skin mass    Other Visit Diagnoses   None.     Meds ordered this encounter  Medications  . traMADol (ULTRAM) 50 MG tablet    Sig: Take 50 mg by mouth as needed.    Refill:  0   1. Essential hypertension Cont meds . Try to reduce smoking.  2. Left knee pain   3. Skin mass

## 2016-07-17 NOTE — Patient Instructions (Signed)
Patient declines flu shot

## 2016-07-19 ENCOUNTER — Telehealth: Payer: Self-pay | Admitting: *Deleted

## 2016-07-19 NOTE — Telephone Encounter (Signed)
Notified patient as instructed, patient pleased. Discussed follow-up appointments, patient agrees  

## 2016-07-19 NOTE — Telephone Encounter (Signed)
-----   Message from Christene Lye, MD sent at 07/19/2016 10:36 AM EDT ----- Rosann Auerbach, please let pt pt know the pathology was normal.

## 2016-07-25 ENCOUNTER — Encounter: Payer: Self-pay | Admitting: General Surgery

## 2016-07-25 ENCOUNTER — Ambulatory Visit (INDEPENDENT_AMBULATORY_CARE_PROVIDER_SITE_OTHER): Payer: Commercial Managed Care - HMO | Admitting: General Surgery

## 2016-07-25 VITALS — BP 128/74 | HR 82 | Resp 14 | Ht 67.0 in | Wt 150.0 lb

## 2016-07-25 DIAGNOSIS — D3613 Benign neoplasm of peripheral nerves and autonomic nervous system of lower limb, including hip: Secondary | ICD-10-CM

## 2016-07-25 DIAGNOSIS — D2122 Benign neoplasm of connective and other soft tissue of left lower limb, including hip: Secondary | ICD-10-CM

## 2016-07-25 DIAGNOSIS — L729 Follicular cyst of the skin and subcutaneous tissue, unspecified: Secondary | ICD-10-CM

## 2016-07-25 NOTE — Progress Notes (Signed)
Patient ID: Kristin Parker, female   DOB: 1951-02-07, 65 y.o.   MRN: [id number]  Chief Complaint  Patient presents with  . Follow-up    HPI Kristin Parker is a 65 y.o. female here today for follow up and suture removal post excision right thigh and left thigh masses. Denies any complaints. No redness or drainage reported at incision sites.  I have reviewed the history of present illness with the patient.  HPI  Past Medical History:  Diagnosis Date  . Anxiety   . Bilateral carotid bruits   . Depression   . Mixed hyperlipidemia   . Shoulder pain   . Sleeping difficulties     Past Surgical History:  Procedure Laterality Date  . CATARACT EXTRACTION Left    2017  . COLONOSCOPY  2012   Dr Candace Cruise    Family History  Problem Relation Age of Onset  . Cancer Mother     bladder cancer  . Cancer Father     lung    Social History Social History  Substance Use Topics  . Smoking status: Current Every Day Smoker    Packs/day: 1.00    Years: 30.00    Types: Cigarettes  . Smokeless tobacco: Never Used  . Alcohol use No    Allergies  Allergen Reactions  . Aleve [Naproxen Sodium] Swelling    Lips. But patient can take Ibuprofen without problems    Current Outpatient Prescriptions  Medication Sig Dispense Refill  . diphenhydrAMINE (BENADRYL) 25 MG tablet Take 25 mg by mouth daily.    . fluticasone (FLONASE) 50 MCG/ACT nasal spray Place 1 spray into both nostrils daily. 16 g 12  . ibuprofen (ADVIL,MOTRIN) 600 MG tablet take 1 tablet by mouth three times a day (Patient taking differently: take 1 tablet by mouth three times a day prn) 120 tablet 1  . losartan (COZAAR) 100 MG tablet take 1 tablet by mouth once daily 30 tablet 12  . meloxicam (MOBIC) 15 MG tablet Take 1 tablet (15 mg total) by mouth daily. (Patient taking differently: Take 15 mg by mouth daily as needed. ) 30 tablet 1  . metoprolol tartrate (LOPRESSOR) 25 MG tablet Take 1 tablet (25 mg total) by mouth 2 (two)  times daily. 180 tablet 3  . sertraline (ZOLOFT) 100 MG tablet Take 1 tablet (100 mg total) by mouth daily. 30 tablet 5  . simvastatin (ZOCOR) 20 MG tablet Take 1 tablet (20 mg total) by mouth at bedtime. 30 tablet 5  . traMADol (ULTRAM) 50 MG tablet Take 50 mg by mouth as needed.  0   No current facility-administered medications for this visit.     Review of Systems Review of Systems  Constitutional: Negative.   Respiratory: Negative.   Cardiovascular: Negative.     Blood pressure 128/74, pulse 82, resp. rate 14, height 5\' 7"  (1.702 m), weight 150 lb (68 kg).  Physical Exam Physical Exam  Constitutional: She is oriented to person, place, and time. She appears well-developed and well-nourished.  Musculoskeletal:       Legs: Neurological: She is alert and oriented to person, place, and time.  Skin: Skin is warm and dry.  Psychiatric: Her behavior is normal.    Data Reviewed Progress notes Pathology - Right thigh benign epidermal cyst. Left thigh neuroma  Assessment Right thigh - benign epidermal cyst Left thigh - neuroma  Incision sites well healed without evidence of infection. Patient denies any complaints.  Plan   Sutures removed. Follow up  as needed.      This information has been scribed by Karie Fetch RN, BSN,BC. The patient is aware to call back for any questions or concerns.  Amya Hlad G 07/25/2016, 3:29 PM

## 2016-07-25 NOTE — Patient Instructions (Signed)
The patient is aware to call back for any questions or concerns.  

## 2016-08-14 ENCOUNTER — Ambulatory Visit (INDEPENDENT_AMBULATORY_CARE_PROVIDER_SITE_OTHER): Payer: Medicare HMO | Admitting: Family Medicine

## 2016-08-14 ENCOUNTER — Encounter: Payer: Self-pay | Admitting: Family Medicine

## 2016-08-14 VITALS — BP 160/80 | HR 60 | Temp 98.2°F | Resp 16 | Ht 67.0 in | Wt 152.0 lb

## 2016-08-14 DIAGNOSIS — G8929 Other chronic pain: Secondary | ICD-10-CM | POA: Diagnosis not present

## 2016-08-14 DIAGNOSIS — I10 Essential (primary) hypertension: Secondary | ICD-10-CM | POA: Diagnosis not present

## 2016-08-14 DIAGNOSIS — F419 Anxiety disorder, unspecified: Secondary | ICD-10-CM | POA: Diagnosis not present

## 2016-08-14 DIAGNOSIS — M25562 Pain in left knee: Secondary | ICD-10-CM

## 2016-08-14 DIAGNOSIS — Z720 Tobacco use: Secondary | ICD-10-CM

## 2016-08-14 DIAGNOSIS — E785 Hyperlipidemia, unspecified: Secondary | ICD-10-CM | POA: Diagnosis not present

## 2016-08-14 MED ORDER — CHLORTHALIDONE 25 MG PO TABS: 25.0000 mg | ORAL_TABLET | Freq: Every day | ORAL | 6 refills | Status: DC

## 2016-08-14 NOTE — Patient Instructions (Signed)
Patient refuses flu shot today.

## 2016-08-14 NOTE — Progress Notes (Signed)
Name: Kristin Parker   MRN: [id number]    DOB: 10/16/1951   Date:08/14/2016       Progress Note  Subjective  Chief Complaint  Chief Complaint  Patient presents with  . Hypertension    HPI Here for f/u of HBP.  She still smokes.  Taking her meds.  She c/o some vivid dreams, but no nightmares.  No problem-specific Assessment & Plan notes found for this encounter.   Past Medical History:  Diagnosis Date  . Anxiety   . Bilateral carotid bruits   . Depression   . Mixed hyperlipidemia   . Shoulder pain   . Sleeping difficulties     Past Surgical History:  Procedure Laterality Date  . CATARACT EXTRACTION Left    2017  . COLONOSCOPY  2012   Dr Candace Cruise    Family History  Problem Relation Age of Onset  . Cancer Mother     bladder cancer  . Cancer Father     lung    Social History   Social History  . Marital status: Divorced    Spouse name: N/A  . Number of children: N/A  . Years of education: N/A   Occupational History  . Not on file.   Social History Main Topics  . Smoking status: Current Every Day Smoker    Packs/day: 1.00    Years: 30.00    Types: Cigarettes  . Smokeless tobacco: Never Used  . Alcohol use No  . Drug use: No  . Sexual activity: Not on file   Other Topics Concern  . Not on file   Social History Narrative  . No narrative on file     Current Outpatient Prescriptions:  .  diphenhydrAMINE (BENADRYL) 25 MG tablet, Take 25 mg by mouth daily., Disp: , Rfl:  .  fluticasone (FLONASE) 50 MCG/ACT nasal spray, Place 1 spray into both nostrils daily., Disp: 16 g, Rfl: 12 .  ibuprofen (ADVIL,MOTRIN) 600 MG tablet, take 1 tablet by mouth three times a day (Patient taking differently: take 1 tablet by mouth three times a day prn), Disp: 120 tablet, Rfl: 1 .  losartan (COZAAR) 100 MG tablet, take 1 tablet by mouth once daily, Disp: 30 tablet, Rfl: 12 .  meloxicam (MOBIC) 15 MG tablet, Take 1 tablet (15 mg total) by mouth daily. (Patient taking  differently: Take 15 mg by mouth daily as needed. ), Disp: 30 tablet, Rfl: 1 .  metoprolol tartrate (LOPRESSOR) 25 MG tablet, Take 1 tablet (25 mg total) by mouth 2 (two) times daily., Disp: 180 tablet, Rfl: 3 .  sertraline (ZOLOFT) 100 MG tablet, Take 1 tablet (100 mg total) by mouth daily., Disp: 30 tablet, Rfl: 5 .  simvastatin (ZOCOR) 20 MG tablet, Take 1 tablet (20 mg total) by mouth at bedtime., Disp: 30 tablet, Rfl: 5 .  traMADol (ULTRAM) 50 MG tablet, Take 50 mg by mouth as needed., Disp: , Rfl: 0 .  chlorthalidone (HYGROTON) 25 MG tablet, Take 1 tablet (25 mg total) by mouth daily., Disp: 30 tablet, Rfl: 6  Allergies  Allergen Reactions  . Aleve [Naproxen Sodium] Swelling    Lips. But patient can take Ibuprofen without problems     Review of Systems  Constitutional: Negative for chills, fever, malaise/fatigue and weight loss.  HENT: Negative for hearing loss.   Eyes: Negative for blurred vision and double vision.  Respiratory: Positive for cough and sputum production. Negative for shortness of breath and wheezing.   Cardiovascular: Negative for chest  pain, palpitations, leg swelling and PND.  Gastrointestinal: Negative for abdominal pain, blood in stool and heartburn.  Genitourinary: Negative for dysuria, frequency and urgency.  Musculoskeletal: Positive for joint pain. Negative for myalgias.  Skin: Negative for rash.  Neurological: Negative for dizziness, tremors, weakness and headaches.      Objective  Vitals:   08/14/16 1319 08/14/16 1346 08/14/16 1352  BP: (!) 166/72 (!) 160/80   Pulse: 60  60  Resp: 16    Temp: 98.2 F (36.8 C)    TempSrc: Oral    Weight: 152 lb (68.9 kg)    Height: 5\' 7"  (1.702 m)      Physical Exam  Constitutional: She is oriented to person, place, and time and well-developed, well-nourished, and in no distress. No distress.  HENT:  Head: Normocephalic and atraumatic.  Eyes: Conjunctivae and EOM are normal. Pupils are equal, round, and  reactive to light. No scleral icterus.  Neck: Normal range of motion. Neck supple. Carotid bruit is not present. No thyromegaly present.  Cardiovascular: Normal rate, regular rhythm and normal heart sounds.  Exam reveals no gallop and no friction rub.   No murmur heard. Pulmonary/Chest: Effort normal and breath sounds normal. No respiratory distress. She has no wheezes. She has no rales.  Abdominal: Soft. Bowel sounds are normal. She exhibits no distension and no mass. There is no tenderness.  Musculoskeletal: She exhibits no edema.  Lymphadenopathy:    She has no cervical adenopathy.  Neurological: She is alert and oriented to person, place, and time.  Vitals reviewed.      No results found for this or any previous visit (from the past 2160 hour(s)).   Assessment & Plan  Problem List Items Addressed This Visit      Cardiovascular and Mediastinum   HBP (high blood pressure) - Primary   Relevant Medications   chlorthalidone (HYGROTON) 25 MG tablet   Other Relevant Orders   COMPLETE METABOLIC PANEL WITH GFR   CBC with Differential     Other   Hyperlipidemia   Relevant Medications   chlorthalidone (HYGROTON) 25 MG tablet   Other Relevant Orders   Lipid Profile   Chronic anxiety   Left knee pain   Tobacco abuse    Other Visit Diagnoses   None.     Meds ordered this encounter  Medications  . chlorthalidone (HYGROTON) 25 MG tablet    Sig: Take 1 tablet (25 mg total) by mouth daily.    Dispense:  30 tablet    Refill:  6   1. Essential hypertension Cont Losartan and Metoprolol - COMPLETE METABOLIC PANEL WITH GFR - CBC with Differential - chlorthalidone (HYGROTON) 25 MG tablet; Take 1 tablet (25 mg total) by mouth daily.  Dispense: 30 tablet; Refill: 6  2. Chronic anxiety cont Zoloft  3. Tobacco abuse Discussed stopping 4. Hyperlipidemia, unspecified hyperlipidemia type Cont Zocor - Lipid Profile  5. Chronic pain of left knee

## 2016-08-15 ENCOUNTER — Other Ambulatory Visit: Payer: Medicare HMO

## 2016-08-16 LAB — COMPLETE METABOLIC PANEL WITH GFR
ALT: 8 U/L (ref 6–29)
AST: 12 U/L (ref 10–35)
Albumin: 3.9 g/dL (ref 3.6–5.1)
Alkaline Phosphatase: 98 U/L (ref 33–130)
BUN: 16 mg/dL (ref 7–25)
CALCIUM: 9 mg/dL (ref 8.6–10.4)
CHLORIDE: 108 mmol/L (ref 98–110)
CO2: 26 mmol/L (ref 20–31)
Creat: 0.83 mg/dL (ref 0.50–0.99)
GFR, EST AFRICAN AMERICAN: 84 mL/min (ref >=60)
GFR, Est Non African American: 73 mL/min (ref >=60)
Glucose, Bld: 95 mg/dL (ref 65–99)
POTASSIUM: 4.3 mmol/L (ref 3.5–5.3)
Sodium: 142 mmol/L (ref 135–146)
Total Bilirubin: 0.3 mg/dL (ref 0.2–1.2)
Total Protein: 6.5 g/dL (ref 6.1–8.1)

## 2016-08-16 LAB — CBC WITH DIFFERENTIAL/PLATELET
BASOS ABS: 0 {cells}/uL (ref 0–200)
Basophils Relative: 0 %
EOS ABS: 73 {cells}/uL (ref 15–500)
Eosinophils Relative: 1 %
HCT: 43 % (ref 35.0–45.0)
Hemoglobin: 14.6 g/dL (ref 11.7–15.5)
LYMPHS PCT: 39 %
Lymphs Abs: 2847 cells/uL (ref 850–3900)
MCH: 30.9 pg (ref 27.0–33.0)
MCHC: 34 g/dL (ref 32.0–36.0)
MCV: 90.9 fL (ref 80.0–100.0)
MONOS PCT: 8 %
MPV: 9.8 fL (ref 7.5–12.5)
Monocytes Absolute: 584 cells/uL (ref 200–950)
Neutro Abs: 3796 cells/uL (ref 1500–7800)
Neutrophils Relative %: 52 %
PLATELETS: 260 10*3/uL (ref 140–400)
RBC: 4.73 MIL/uL (ref 3.80–5.10)
RDW: 13.9 % (ref 11.0–15.0)
WBC: 7.3 10*3/uL (ref 3.8–10.8)

## 2016-08-16 LAB — LIPID PANEL
CHOL/HDL RATIO: 6.8 ratio — AB (ref <=5.0)
CHOLESTEROL: 212 mg/dL — AB (ref 125–200)
HDL: 31 mg/dL — ABNORMAL LOW (ref >=46)
LDL Cholesterol: 126 mg/dL (ref <130)
TRIGLYCERIDES: 275 mg/dL — AB (ref <150)
VLDL: 55 mg/dL — AB (ref <30)

## 2016-08-17 MED ORDER — SIMVASTATIN 40 MG PO TABS: 40.0000 mg | ORAL_TABLET | Freq: Every day | ORAL | 3 refills | Status: DC

## 2016-08-17 NOTE — Addendum Note (Signed)
Addended by: Devona Konig on: 08/17/2016 10:52 AM   Modules accepted: Orders

## 2016-08-28 ENCOUNTER — Telehealth: Payer: Self-pay | Admitting: General Surgery

## 2016-08-28 NOTE — Telephone Encounter (Signed)
08-28-16 L/M FOR PT TO CALL & SCHEDULE AN APPOINTMENT WITH DR Jamal Collin REF DR J HAWKINS FOR SKIN MASS ON LT LEG.PT HAS SEEN DR Jamal Collin 2017.HAS [facility] THN(NEED REF!!!)

## 2016-09-04 ENCOUNTER — Other Ambulatory Visit: Payer: Self-pay | Admitting: Family Medicine

## 2016-09-04 DIAGNOSIS — F32A Depression, unspecified: Secondary | ICD-10-CM

## 2016-09-04 DIAGNOSIS — F329 Major depressive disorder, single episode, unspecified: Secondary | ICD-10-CM

## 2016-09-04 MED ORDER — SIMVASTATIN 40 MG PO TABS: 40.0000 mg | ORAL_TABLET | Freq: Every day | ORAL | 3 refills | Status: DC

## 2016-09-04 MED ORDER — SERTRALINE HCL 100 MG PO TABS: 100.0000 mg | ORAL_TABLET | Freq: Every day | ORAL | 3 refills | Status: DC

## 2016-09-17 ENCOUNTER — Other Ambulatory Visit: Payer: Self-pay | Admitting: *Deleted

## 2016-09-18 ENCOUNTER — Ambulatory Visit (INDEPENDENT_AMBULATORY_CARE_PROVIDER_SITE_OTHER): Payer: Medicare HMO | Admitting: Family Medicine

## 2016-09-18 ENCOUNTER — Encounter: Payer: Self-pay | Admitting: Family Medicine

## 2016-09-18 ENCOUNTER — Other Ambulatory Visit: Payer: Self-pay | Admitting: *Deleted

## 2016-09-18 VITALS — BP 124/69 | HR 61 | Temp 98.8°F | Resp 16 | Ht 67.0 in | Wt 157.0 lb

## 2016-09-18 DIAGNOSIS — I10 Essential (primary) hypertension: Secondary | ICD-10-CM | POA: Diagnosis not present

## 2016-09-18 DIAGNOSIS — E7849 Other hyperlipidemia: Secondary | ICD-10-CM

## 2016-09-18 DIAGNOSIS — E784 Other hyperlipidemia: Secondary | ICD-10-CM | POA: Diagnosis not present

## 2016-09-18 DIAGNOSIS — F419 Anxiety disorder, unspecified: Secondary | ICD-10-CM | POA: Diagnosis not present

## 2016-09-18 NOTE — Progress Notes (Signed)
Name: Kristin Parker   MRN: [id number]    DOB: Jun 09, 1951   Date:09/18/2016       Progress Note  Subjective  Chief Complaint  Chief Complaint  Patient presents with  . Follow-up    hyperlipidemia anixiety, and hypertension     HPI Here to follow up on BP and depression/anxiety, elevated lipids.  She feels well on Zoloft. But still having some problems going to sleep because her brain is going to fast.  No problem-specific Assessment & Plan notes found for this encounter.   Past Medical History:  Diagnosis Date  . Anxiety   . Bilateral carotid bruits   . Depression   . Mixed hyperlipidemia   . Shoulder pain   . Sleeping difficulties     Past Surgical History:  Procedure Laterality Date  . CATARACT EXTRACTION Left    2017  . COLONOSCOPY  2012   Dr Candace Cruise    Family History  Problem Relation Age of Onset  . Cancer Mother     bladder cancer  . Cancer Father     lung    Social History   Social History  . Marital status: Divorced    Spouse name: N/A  . Number of children: N/A  . Years of education: N/A   Occupational History  . Not on file.   Social History Main Topics  . Smoking status: Current Every Day Smoker    Packs/day: 1.00    Years: 30.00    Types: Cigarettes  . Smokeless tobacco: Never Used     Comment: 8 cigarettes day   . Alcohol use No  . Drug use: No  . Sexual activity: Not on file   Other Topics Concern  . Not on file   Social History Narrative  . No narrative on file     Current Outpatient Prescriptions:  .  chlorthalidone (HYGROTON) 25 MG tablet, Take 1 tablet (25 mg total) by mouth daily., Disp: 30 tablet, Rfl: 6 .  diphenhydrAMINE (BENADRYL) 25 MG tablet, Take 25 mg by mouth daily., Disp: , Rfl:  .  fluticasone (FLONASE) 50 MCG/ACT nasal spray, Place 1 spray into both nostrils daily., Disp: 16 g, Rfl: 12 .  losartan (COZAAR) 100 MG tablet, take 1 tablet by mouth once daily, Disp: 30 tablet, Rfl: 12 .  meloxicam (MOBIC) 15 MG  tablet, Take 1 tablet (15 mg total) by mouth daily. (Patient taking differently: Take 15 mg by mouth daily as needed. ), Disp: 30 tablet, Rfl: 1 .  metoprolol tartrate (LOPRESSOR) 25 MG tablet, Take 1 tablet (25 mg total) by mouth 2 (two) times daily., Disp: 180 tablet, Rfl: 3 .  sertraline (ZOLOFT) 100 MG tablet, Take 1 tablet (100 mg total) by mouth daily., Disp: 90 tablet, Rfl: 3 .  simvastatin (ZOCOR) 40 MG tablet, Take 1 tablet (40 mg total) by mouth at bedtime., Disp: 90 tablet, Rfl: 3 .  ibuprofen (ADVIL,MOTRIN) 600 MG tablet, take 1 tablet by mouth three times a day (Patient not taking: Reported on 09/18/2016), Disp: 120 tablet, Rfl: 1 .  traMADol (ULTRAM) 50 MG tablet, Take 50 mg by mouth as needed., Disp: , Rfl: 0  Allergies  Allergen Reactions  . Aleve [Naproxen Sodium] Swelling    Lips. But patient can take Ibuprofen without problems     Review of Systems  Constitutional: Negative for chills, fever, malaise/fatigue and weight loss.  HENT: Negative for hearing loss and tinnitus.   Eyes: Negative for blurred vision and double vision.  Respiratory: Negative for cough, shortness of breath and wheezing.   Cardiovascular: Negative for chest pain, palpitations and leg swelling.  Gastrointestinal: Negative for abdominal pain, blood in stool and heartburn.  Genitourinary: Negative for dysuria, frequency and urgency.  Musculoskeletal: Negative for joint pain and myalgias.  Skin: Negative for rash.  Neurological: Negative for dizziness, tingling, tremors, weakness and headaches.      Objective  Vitals:   09/18/16 1327  BP: 124/69  Pulse: 61  Resp: 16  Temp: 98.8 F (37.1 C)  TempSrc: Oral  Weight: 71.2 kg (157 lb)  Height: '5\' 7"'$  (1.702 m)    Physical Exam  Constitutional: She is oriented to person, place, and time and well-developed, well-nourished, and in no distress. No distress.  HENT:  Head: Normocephalic and atraumatic.  Eyes: Conjunctivae and EOM are normal.  Pupils are equal, round, and reactive to light. No scleral icterus.  Neck: Normal range of motion. Neck supple. Carotid bruit is not present. No thyromegaly present.  Cardiovascular: Normal rate, regular rhythm and normal heart sounds.  Exam reveals no gallop and no friction rub.   No murmur heard. Pulmonary/Chest: Effort normal and breath sounds normal. No respiratory distress. She has no wheezes. She has no rales.  Musculoskeletal: She exhibits no edema.  Lymphadenopathy:    She has no cervical adenopathy.  Neurological: She is alert and oriented to person, place, and time.  Psychiatric: Mood, memory, affect and judgment normal.  Vitals reviewed.      Recent Results (from the past 2160 hour(s))  COMPLETE METABOLIC PANEL WITH GFR     Status: None   Collection Time: 08/14/16  8:05 AM  Result Value Ref Range   Sodium 142 135 - 146 mmol/L   Potassium 4.3 3.5 - 5.3 mmol/L   Chloride 108 98 - 110 mmol/L   CO2 26 20 - 31 mmol/L   Glucose, Bld 95 65 - 99 mg/dL   BUN 16 7 - 25 mg/dL   Creat 0.83 0.50 - 0.99 mg/dL    Comment:   For patients > or = 67 years of age: The upper reference limit for Creatinine is approximately 13% higher for people identified as African-American.      Total Bilirubin 0.3 0.2 - 1.2 mg/dL   Alkaline Phosphatase 98 33 - 130 U/L   AST 12 10 - 35 U/L   ALT 8 6 - 29 U/L   Total Protein 6.5 6.1 - 8.1 g/dL   Albumin 3.9 3.6 - 5.1 g/dL   Calcium 9.0 8.6 - 10.4 mg/dL   GFR, Est African American 84 >=60 mL/min   GFR, Est Non African American 73 >=60 mL/min  Lipid Profile     Status: Abnormal   Collection Time: 08/14/16  8:05 AM  Result Value Ref Range   Cholesterol 212 (H) 125 - 200 mg/dL   Triglycerides 275 (H) <150 mg/dL   HDL 31 (L) >=46 mg/dL   Total CHOL/HDL Ratio 6.8 (H) <=5.0 Ratio   VLDL 55 (H) <30 mg/dL   LDL Cholesterol 126 <130 mg/dL    Comment:   Total Cholesterol/HDL Ratio:CHD Risk                        Coronary Heart Disease Risk Table  Men       Women          1/2 Average Risk              3.4        3.3              Average Risk              5.0        4.4           2X Average Risk              9.6        7.1           3X Average Risk             23.4       11.0 Use the calculated Patient Ratio above and the CHD Risk table  to determine the patient's CHD Risk.   CBC with Differential     Status: None   Collection Time: 08/14/16  8:05 AM  Result Value Ref Range   WBC 7.3 3.8 - 10.8 K/uL   RBC 4.73 3.80 - 5.10 MIL/uL   Hemoglobin 14.6 11.7 - 15.5 g/dL   HCT 43.0 35.0 - 45.0 %   MCV 90.9 80.0 - 100.0 fL   MCH 30.9 27.0 - 33.0 pg   MCHC 34.0 32.0 - 36.0 g/dL   RDW 13.9 11.0 - 15.0 %   Platelets 260 140 - 400 K/uL   MPV 9.8 7.5 - 12.5 fL   Neutro Abs 3,796 1,500 - 7,800 cells/uL   Lymphs Abs 2,847 850 - 3,900 cells/uL   Monocytes Absolute 584 200 - 950 cells/uL   Eosinophils Absolute 73 15 - 500 cells/uL   Basophils Absolute 0 0 - 200 cells/uL   Neutrophils Relative % 52 %   Lymphocytes Relative 39 %   Monocytes Relative 8 %   Eosinophils Relative 1 %   Basophils Relative 0 %   Smear Review Criteria for review not met      Assessment & Plan  Problem List Items Addressed This Visit      Cardiovascular and Mediastinum   HBP (high blood pressure) - Primary     Other   Hyperlipidemia   Chronic anxiety      No orders of the defined types were placed in this encounter.  1. Essential hypertension Cont Chlorthaladone, Losartan, and Metoprolol  2. Chronic anxiety Cont Zoloft  3. Other hyperlipidemia Cont Simvasatin

## 2016-10-01 ENCOUNTER — Other Ambulatory Visit: Payer: Self-pay | Admitting: Family Medicine

## 2016-10-03 ENCOUNTER — Other Ambulatory Visit: Payer: Self-pay | Admitting: Family Medicine

## 2016-12-18 ENCOUNTER — Ambulatory Visit (INDEPENDENT_AMBULATORY_CARE_PROVIDER_SITE_OTHER): Payer: Medicare HMO | Admitting: Family Medicine

## 2016-12-18 ENCOUNTER — Encounter: Payer: Self-pay | Admitting: Family Medicine

## 2016-12-18 VITALS — BP 115/55 | HR 60 | Temp 98.3°F | Resp 16 | Ht 67.0 in | Wt 158.0 lb

## 2016-12-18 DIAGNOSIS — R0989 Other specified symptoms and signs involving the circulatory and respiratory systems: Secondary | ICD-10-CM

## 2016-12-18 DIAGNOSIS — F419 Anxiety disorder, unspecified: Secondary | ICD-10-CM | POA: Diagnosis not present

## 2016-12-18 DIAGNOSIS — I10 Essential (primary) hypertension: Secondary | ICD-10-CM | POA: Diagnosis not present

## 2016-12-18 DIAGNOSIS — Z720 Tobacco use: Secondary | ICD-10-CM | POA: Diagnosis not present

## 2016-12-18 DIAGNOSIS — J0140 Acute pansinusitis, unspecified: Secondary | ICD-10-CM

## 2016-12-18 MED ORDER — AMOXICILLIN-POT CLAVULANATE 875-125 MG PO TABS: 1.0000 | ORAL_TABLET | Freq: Two times a day (BID) | ORAL | 0 refills | Status: AC

## 2016-12-18 MED ORDER — METOPROLOL TARTRATE 25 MG PO TABS: ORAL_TABLET | ORAL | 3 refills | Status: DC

## 2016-12-18 NOTE — Progress Notes (Signed)
Name: Kristin Parker   MRN: [id number]    DOB: 09-Jun-1951   Date:12/18/2016       Progress Note  Subjective  Chief Complaint  Chief Complaint  Patient presents with  . Hypertension  . Sinusitis    HPI Here for f/u of HBP.  She reports feeling fatigued a lot. She had a cold several weeks ago.  Now with extra pressure in face.  Lots of pnd.  She feels stopped up "all the time".  Mucus seems clear most of the time.   No problem-specific Assessment & Plan notes found for this encounter.   Past Medical History:  Diagnosis Date  . Anxiety   . Bilateral carotid bruits   . Depression   . Mixed hyperlipidemia   . Shoulder pain   . Sleeping difficulties     Past Surgical History:  Procedure Laterality Date  . CATARACT EXTRACTION Left    2017  . COLONOSCOPY  2012   Dr Kristin Parker    Family History  Problem Relation Age of Onset  . Cancer Mother     bladder cancer  . Cancer Father     lung    Social History   Social History  . Marital status: Divorced    Spouse name: N/A  . Number of children: N/A  . Years of education: N/A   Occupational History  . Not on file.   Social History Main Topics  . Smoking status: Current Every Day Smoker    Packs/day: 1.00    Years: 30.00    Types: Cigarettes  . Smokeless tobacco: Never Used     Comment: 8 cigarettes day   . Alcohol use No  . Drug use: No  . Sexual activity: Not on file   Other Topics Concern  . Not on file   Social History Narrative  . No narrative on file     Current Outpatient Prescriptions:  .  chlorthalidone (HYGROTON) 25 MG tablet, Take 1 tablet (25 mg total) by mouth daily., Disp: 30 tablet, Rfl: 6 .  diphenhydrAMINE (BENADRYL) 25 MG tablet, Take 25 mg by mouth at bedtime. , Disp: , Rfl:  .  fluticasone (FLONASE) 50 MCG/ACT nasal spray, Place 1 spray into both nostrils daily., Disp: 16 g, Rfl: 12 .  ibuprofen (ADVIL,MOTRIN) 600 MG tablet, take 1 tablet by mouth three times a day, Disp: 120 tablet, Rfl:  6 .  losartan (COZAAR) 100 MG tablet, take 1 tablet by mouth once daily, Disp: 30 tablet, Rfl: 12 .  meloxicam (MOBIC) 15 MG tablet, Take 1 tablet (15 mg total) by mouth daily. (Patient taking differently: Take 15 mg by mouth daily as needed. ), Disp: 30 tablet, Rfl: 1 .  metoprolol tartrate (LOPRESSOR) 25 MG tablet, Take 1/2 tablet twice a day, Disp: 90 tablet, Rfl: 3 .  sertraline (ZOLOFT) 100 MG tablet, Take 1 tablet (100 mg total) by mouth daily., Disp: 90 tablet, Rfl: 3 .  simvastatin (ZOCOR) 40 MG tablet, Take 1 tablet (40 mg total) by mouth at bedtime., Disp: 90 tablet, Rfl: 3 .  amoxicillin-clavulanate (AUGMENTIN) 875-125 MG tablet, Take 1 tablet by mouth 2 (two) times daily. Take for 10 days., Disp: 20 tablet, Rfl: 0  Allergies  Allergen Reactions  . Aleve [Naproxen Sodium] Swelling    Lips. But patient can take Ibuprofen without problems     Review of Systems  Constitutional: Positive for malaise/fatigue. Negative for chills, fever and weight loss.  HENT: Positive for congestion and sinus pain.  Negative for hearing loss and tinnitus.   Eyes: Negative for blurred vision and double vision.  Respiratory: Negative for cough, shortness of breath and wheezing.   Cardiovascular: Negative for chest pain, palpitations and leg swelling.  Gastrointestinal: Negative for abdominal pain, blood in stool and heartburn.  Genitourinary: Negative for dysuria, frequency and urgency.  Skin: Negative for rash.  Neurological: Positive for headaches. Negative for dizziness, tingling, tremors and weakness.      Objective  Vitals:   12/18/16 1334 12/18/16 1412  BP: (!) 114/45 (!) 115/55  Pulse: (!) 56 60  Resp: 16   Temp: 98.3 F (36.8 C)   TempSrc: Oral   Weight: 158 lb (71.7 kg)   Height: 5\' 7"  (1.702 m)     Physical Exam  Constitutional: She is oriented to person, place, and time and well-developed, well-nourished, and in no distress. No distress.  HENT:  Head: Normocephalic and  atraumatic.  Right Ear: External ear normal.  Left Ear: External ear normal.  Nose: Mucosal edema and rhinorrhea present. Right sinus exhibits maxillary sinus tenderness and frontal sinus tenderness. Left sinus exhibits maxillary sinus tenderness and frontal sinus tenderness.  Mouth/Throat: Oropharynx is clear and moist.  Eyes: Conjunctivae and EOM are normal. Pupils are equal, round, and reactive to light. No scleral icterus.  Neck: Normal range of motion. Neck supple. Carotid bruit is present (faint L sided bruit). No thyromegaly present.  Cardiovascular: Normal rate, regular rhythm and normal heart sounds.  Exam reveals no gallop and no friction rub.   No murmur heard. Pulmonary/Chest: Effort normal and breath sounds normal. No respiratory distress. She has no wheezes. She has no rales.  Musculoskeletal: She exhibits no edema.  Lymphadenopathy:    She has no cervical adenopathy.  Neurological: She is alert and oriented to person, place, and time.  Vitals reviewed.      No results found for this or any previous visit (from the past 2160 hour(s)).   Assessment & Plan  Problem List Items Addressed This Visit      Cardiovascular and Mediastinum   HBP (high blood pressure) - Primary   Relevant Medications   metoprolol tartrate (LOPRESSOR) 25 MG tablet     Other   Chronic anxiety   Carotid bruit present   Tobacco abuse    Other Visit Diagnoses    Acute pansinusitis, recurrence not specified       Relevant Medications   amoxicillin-clavulanate (AUGMENTIN) 875-125 MG tablet   Other Relevant Orders   Ambulatory referral to ENT      Meds ordered this encounter  Medications  . metoprolol tartrate (LOPRESSOR) 25 MG tablet    Sig: Take 1/2 tablet twice a day    Dispense:  90 tablet    Refill:  3  . amoxicillin-clavulanate (AUGMENTIN) 875-125 MG tablet    Sig: Take 1 tablet by mouth 2 (two) times daily. Take for 10 days.    Dispense:  20 tablet    Refill:  0   1.  Essential hypertension Cont chlorthalidone and Losartan. - metoprolol tartrate (LOPRESSOR) 25 MG tablet; Take 1/2 tablet twice a day  Dispense: 90 tablet; Refill: 3- decreased from 1 tablet twice a day. 2. Chronic anxiety Cont Zoloft  3. Bilateral carotid bruits   4. Tobacco abuse Discussed stopping smoking  5. Acute pansinusitis, recurrence not specified  - amoxicillin-clavulanate (AUGMENTIN) 875-125 MG tablet; Take 1 tablet by mouth 2 (two) times daily. Take for 10 days.  Dispense: 20 tablet; Refill: 0 - Ambulatory  referral to ENT

## 2017-01-15 ENCOUNTER — Ambulatory Visit (INDEPENDENT_AMBULATORY_CARE_PROVIDER_SITE_OTHER): Payer: Medicare HMO | Admitting: Family Medicine

## 2017-01-15 ENCOUNTER — Encounter: Payer: Self-pay | Admitting: Family Medicine

## 2017-01-15 VITALS — BP 125/40 | HR 66 | Temp 98.2°F | Resp 16 | Ht 67.0 in | Wt 159.0 lb

## 2017-01-15 DIAGNOSIS — F419 Anxiety disorder, unspecified: Secondary | ICD-10-CM | POA: Diagnosis not present

## 2017-01-15 DIAGNOSIS — I10 Essential (primary) hypertension: Secondary | ICD-10-CM

## 2017-01-15 DIAGNOSIS — R0989 Other specified symptoms and signs involving the circulatory and respiratory systems: Secondary | ICD-10-CM

## 2017-01-15 MED ORDER — HYDROCHLOROTHIAZIDE 12.5 MG PO TABS: 12.5000 mg | ORAL_TABLET | Freq: Every day | ORAL | 3 refills | Status: DC

## 2017-01-15 NOTE — Progress Notes (Signed)
Name: Kristin Parker   MRN: [id number]    DOB: January 23, 1951   Date:01/15/2017       Progress Note  Subjective  Chief Complaint  Chief Complaint  Patient presents with  . Hypertension    HPI Here for f/u of HBP and depression.  Feels that she is very tired and gives out easily.  No motivation.  This has been going on for several months.  Chemistries and CBC are wnl.  Her lipids  Were elevated, but she is on higher dose of statin now.  No problem-specific Assessment & Plan notes found for this encounter.   Past Medical History:  Diagnosis Date  . Anxiety   . Bilateral carotid bruits   . Depression   . Mixed hyperlipidemia   . Shoulder pain   . Sleeping difficulties     Past Surgical History:  Procedure Laterality Date  . CATARACT EXTRACTION Left    2017  . COLONOSCOPY  2012   Dr Candace Cruise    Family History  Problem Relation Age of Onset  . Cancer Mother     bladder cancer  . Cancer Father     lung    Social History   Social History  . Marital status: Divorced    Spouse name: N/A  . Number of children: N/A  . Years of education: N/A   Occupational History  . Not on file.   Social History Main Topics  . Smoking status: Current Every Day Smoker    Packs/day: 1.00    Years: 30.00    Types: Cigarettes  . Smokeless tobacco: Never Used     Comment: 8 cigarettes day   . Alcohol use No  . Drug use: No  . Sexual activity: Not on file   Other Topics Concern  . Not on file   Social History Narrative  . No narrative on file     Current Outpatient Prescriptions:  .  diphenhydrAMINE (BENADRYL) 25 MG tablet, Take 25 mg by mouth at bedtime. , Disp: , Rfl:  .  fluticasone (FLONASE) 50 MCG/ACT nasal spray, Place 1 spray into both nostrils daily., Disp: 16 g, Rfl: 12 .  hydrochlorothiazide (HYDRODIURIL) 12.5 MG tablet, Take 1 tablet (12.5 mg total) by mouth daily., Disp: 90 tablet, Rfl: 3 .  ibuprofen (ADVIL,MOTRIN) 600 MG tablet, take 1 tablet by mouth three times a  day, Disp: 120 tablet, Rfl: 6 .  losartan (COZAAR) 100 MG tablet, take 1 tablet by mouth once daily, Disp: 30 tablet, Rfl: 12 .  meloxicam (MOBIC) 15 MG tablet, Take 1 tablet (15 mg total) by mouth daily. (Patient taking differently: Take 15 mg by mouth daily as needed. ), Disp: 30 tablet, Rfl: 1 .  metoprolol tartrate (LOPRESSOR) 25 MG tablet, Take 1/2 tablet twice a day, Disp: 90 tablet, Rfl: 3 .  sertraline (ZOLOFT) 100 MG tablet, Take 1 tablet (100 mg total) by mouth daily., Disp: 90 tablet, Rfl: 3 .  simvastatin (ZOCOR) 40 MG tablet, Take 1 tablet (40 mg total) by mouth at bedtime., Disp: 90 tablet, Rfl: 3  Allergies  Allergen Reactions  . Aleve [Naproxen Sodium] Swelling    Lips. But patient can take Ibuprofen without problems     Review of Systems  Constitutional: Negative for fever, malaise/fatigue and weight loss.  HENT: Negative for hearing loss and tinnitus.   Eyes: Negative for blurred vision and double vision.  Respiratory: Negative for cough, shortness of breath and wheezing.   Cardiovascular: Negative for chest pain,  palpitations and leg swelling.  Gastrointestinal: Negative for heartburn and nausea.  Genitourinary: Negative for dysuria, frequency and urgency.  Musculoskeletal: Negative for joint pain and myalgias.  Skin: Negative for rash.  Neurological: Negative for dizziness, tingling, tremors, weakness and headaches.      Objective  Vitals:   01/15/17 1518 01/15/17 1557  BP: (!) 125/44 (!) 125/40  Pulse: 66   Resp: 16   Temp: 98.2 F (36.8 C)   TempSrc: Oral   SpO2: 96%   Weight: 159 lb (72.1 kg)   Height: 5\' 7"  (1.702 m)     Physical Exam  Constitutional: She is oriented to person, place, and time and well-developed, well-nourished, and in no distress. No distress.  HENT:  Head: Normocephalic and atraumatic.  Eyes: Conjunctivae and EOM are normal. Pupils are equal, round, and reactive to light. No scleral icterus.  Neck: Normal range of motion.  Neck supple. Carotid bruit is present (bilateral). No thyromegaly present.  Cardiovascular: Normal rate and regular rhythm.  Exam reveals no gallop and no friction rub.   Murmur heard.  Systolic murmur is present with a grade of 2/6  throughout  Pulmonary/Chest: Effort normal and breath sounds normal. No respiratory distress. She has no wheezes. She has no rales.  Abdominal: Soft. Bowel sounds are normal. She exhibits no distension, no abdominal bruit and no mass. There is no tenderness. There is no rebound.  Musculoskeletal: She exhibits no edema.  Lymphadenopathy:    She has no cervical adenopathy.  Neurological: She is alert and oriented to person, place, and time.  Vitals reviewed.      No results found for this or any previous visit (from the past 2160 hour(s)).   Assessment & Plan  Problem List Items Addressed This Visit      Cardiovascular and Mediastinum   HBP (high blood pressure) - Primary   Relevant Medications   hydrochlorothiazide (HYDRODIURIL) 12.5 MG tablet     Other   Chronic anxiety   Carotid bruit present   Relevant Orders   Ambulatory referral to Cardiology      Meds ordered this encounter  Medications  . hydrochlorothiazide (HYDRODIURIL) 12.5 MG tablet    Sig: Take 1 tablet (12.5 mg total) by mouth daily.    Dispense:  90 tablet    Refill:  3   1. Essential hypertension Cont Metoprolol - hydrochlorothiazide (HYDRODIURIL) 12.5 MG tablet; Take 1 tablet (12.5 mg total) by mouth daily.  Dispense: 90 tablet; Refill: 3 Stop Chlorthalodine   2. Bilateral carotid bruits - Ambulatory referral to Cardiology  3. Chronic anxiety Cont Zoloft

## 2017-01-24 ENCOUNTER — Encounter: Payer: Self-pay | Admitting: Internal Medicine

## 2017-01-24 ENCOUNTER — Ambulatory Visit (INDEPENDENT_AMBULATORY_CARE_PROVIDER_SITE_OTHER): Payer: Medicare HMO | Admitting: Internal Medicine

## 2017-01-24 VITALS — BP 148/80 | HR 74 | Ht 67.0 in | Wt 162.2 lb

## 2017-01-24 DIAGNOSIS — R0989 Other specified symptoms and signs involving the circulatory and respiratory systems: Secondary | ICD-10-CM

## 2017-01-24 DIAGNOSIS — I10 Essential (primary) hypertension: Secondary | ICD-10-CM

## 2017-01-24 DIAGNOSIS — R0609 Other forms of dyspnea: Secondary | ICD-10-CM

## 2017-01-24 DIAGNOSIS — R011 Cardiac murmur, unspecified: Secondary | ICD-10-CM | POA: Diagnosis not present

## 2017-01-24 DIAGNOSIS — E785 Hyperlipidemia, unspecified: Secondary | ICD-10-CM

## 2017-01-24 DIAGNOSIS — Z720 Tobacco use: Secondary | ICD-10-CM | POA: Diagnosis not present

## 2017-01-24 NOTE — Progress Notes (Signed)
New Outpatient Visit Date: 01/24/2017  Referring Provider: Arlis Porta., MD [address], [place] [address]  Chief Complaint: Heart murmur and carotid bruits  HPI:  Kristin Parker is a 66 y.o. year-old female with history of hypertension, hyperlipidemia, depression, and anxiety, who has been referred by Dr. Luan Pulling for evaluation of carotid bruits and heart murmur noted recently on exam. The patient reports exertional dyspnea for the last several months, most pronounced when she walks up a hill. She does not have any symptoms at rest. She denies chest pain, orthopnea, PND, leg edema, and claudication. She has rare palpitations that she describes as a skipped beat. She has also had very sporadic episodes of "wooziness" that she attributes to medications. She was recently found to be bradycardic with a heart rate in the 40s, prompting de-escalation of metoprolol. With this change, her fatigue and wooziness have improved. She notes occasional tingling in both hands, predominantly at night. She otherwise denies paresthesias and focal weakness, as well as vision changes including amaurosis fugax.  The patient denies a prior history of cardiovascular disease. She has not undergone cardiac testing in the past..  --------------------------------------------------------------------------------------------------  Cardiovascular History & Procedures: Cardiovascular Problems:  Dyspnea on exertion  Heart murmur  Carotid bruit  Risk Factors:  Hypertension, hyperlipidemia, tobacco use, and age greater than 55  Cath/PCI:  None  CV Surgery:  None  EP Procedures and Devices:  None  Non-Invasive Evaluation(s):  None  Recent CV Pertinent Labs: Lab Results  Component Value Date   CHOL 212 (H) 08/14/2016   HDL 31 (L) 08/14/2016   LDLCALC 126 08/14/2016   TRIG 275 (H) 08/14/2016   CHOLHDL 6.8 (H) 08/14/2016   K 4.3 08/14/2016   BUN 16 08/14/2016   CREATININE 0.83 08/14/2016     --------------------------------------------------------------------------------------------------  Past Medical History:  Diagnosis Date  . Anxiety   . Bilateral carotid bruits   . Depression   . Mixed hyperlipidemia   . Shoulder pain   . Sleeping difficulties     Past Surgical History:  Procedure Laterality Date  . CATARACT EXTRACTION Left    2017  . COLONOSCOPY  2012   Dr Candace Cruise    Outpatient Encounter Prescriptions as of 01/24/2017  Medication Sig  . diphenhydrAMINE (BENADRYL) 25 MG tablet Take 25 mg by mouth at bedtime.   . fluticasone (FLONASE) 50 MCG/ACT nasal spray Place 1 spray into both nostrils daily.  . hydrochlorothiazide (HYDRODIURIL) 12.5 MG tablet Take 1 tablet (12.5 mg total) by mouth daily.  Marland Kitchen ibuprofen (ADVIL,MOTRIN) 600 MG tablet take 1 tablet by mouth three times a day  . losartan (COZAAR) 100 MG tablet take 1 tablet by mouth once daily  . meloxicam (MOBIC) 15 MG tablet Take 1 tablet (15 mg total) by mouth daily. (Patient taking differently: Take 15 mg by mouth daily as needed. )  . metoprolol tartrate (LOPRESSOR) 25 MG tablet Take 1/2 tablet twice a day  . sertraline (ZOLOFT) 100 MG tablet Take 1 tablet (100 mg total) by mouth daily.  . simvastatin (ZOCOR) 40 MG tablet Take 1 tablet (40 mg total) by mouth at bedtime.   No facility-administered encounter medications on file as of 01/24/2017.     Allergies: Aleve [naproxen sodium]  Social History   Social History  . Marital status: Divorced    Spouse name: N/A  . Number of children: N/A  . Years of education: N/A   Occupational History  . Not on file.   Social  History Main Topics  . Smoking status: Current Every Day Smoker    Packs/day: 1.00    Years: 30.00    Types: Cigarettes  . Smokeless tobacco: Never Used     Comment: 8 cigarettes day   . Alcohol use No  . Drug use: No  . Sexual activity: Not on file   Other Topics Concern  . Not on file   Social History Narrative  . No narrative  on file    Family History  Problem Relation Age of Onset  . Cancer Mother     bladder cancer  . Cancer Father     lung    Review of Systems: A 12-system review of systems was performed and was negative except as noted in the HPI.  --------------------------------------------------------------------------------------------------  Physical Exam: BP (!) 148/80 (BP Location: Right Arm, Patient Position: Sitting, Cuff Size: Normal)   Pulse 74   Ht 5\' 7"  (1.702 m)   Wt 162 lb 4 oz (73.6 kg)   BMI 25.41 kg/m   General:  Well-developed, well-nourished woman, seated comfortably in the exam room. HEENT: No conjunctival pallor or scleral icterus.  Moist mucous membranes.  OP clear. Neck: Supple without lymphadenopathy, thyromegaly, JVD, or HJR.  Left carotid bruit noted. Lungs: Normal work of breathing.  Clear to auscultation bilaterally without wheezes or crackles. Heart: Regular rate and rhythm S1 and S2 appreciated. 2/6 crescendo-decrescendo systolic murmur loudest at the right upper sternal border. No rubs or gallops.  Non-displaced PMI. Abd: Bowel sounds present.  Soft, NT/ND without hepatosplenomegaly Ext: No lower extremity edema.  Radial, PT, and DP pulses are 2+ bilaterally. Trace right DP Skin: warm and dry without rash Neuro: CNIII-XII intact.  Strength and fine-touch sensation intact in upper and lower extremities bilaterally. Psych: Normal mood and affect.  EKG:  Normal sinus rhythm with nonspecific ST changes. No prior tracing available for comparison.  Lab Results  Component Value Date   WBC 7.3 08/14/2016   HGB 14.6 08/14/2016   HCT 43.0 08/14/2016   MCV 90.9 08/14/2016   PLT 260 08/14/2016    Lab Results  Component Value Date   NA 142 08/14/2016   K 4.3 08/14/2016   CL 108 08/14/2016   CO2 26 08/14/2016   BUN 16 08/14/2016   CREATININE 0.83 08/14/2016   GLUCOSE 95 08/14/2016   ALT 8 08/14/2016    Lab Results  Component Value Date   CHOL 212 (H)  08/14/2016   HDL 31 (L) 08/14/2016   LDLCALC 126 08/14/2016   TRIG 275 (H) 08/14/2016   CHOLHDL 6.8 (H) 08/14/2016    --------------------------------------------------------------------------------------------------  ASSESSMENT AND PLAN: Dyspnea on exertion This is likely multifactorial, including long history of tobacco use and possible underlying heart disease. Exam today is notable for a 2/6 systolic murmur. The patient appears euvolemic and well compensated with NYHA class II symptoms. She does not have any chest pain. EKG demonstrates only nonspecific ST changes. We have agreed to obtain a transthoracic echocardiogram for further characterization. If no significant structural abnormalities are identified to explain her exertional dyspnea, we will consider further noninvasive testing to exclude underlying ischemia.  Heart murmur Murmur most suggestive of aortic sclerosis or stenosis. S2 is audible; I therefore do not believe this represents critical aortic stenosis. We will proceed with transthoracic echocardiogram for further characterization.  Carotid bruit Left carotid bruit is appreciated on exam today, though Dr. Luan Pulling has appreciated bilateral bruits in the past. The patient does not have any neurologic symptoms of  stroke or TIA. Bilateral hand paresthesias are most likely due to carpal tunnel syndrome. We will obtain bilateral carotid artery Doppler studies for further characterization.  Hypertension Blood pressure modestly elevated today. We will defer making any changes at this time pending aforementioned workup.  Dyslipidemia Lipid panel in 07/2016 noted below for borderline LDL, elevated triglycerides, and low HDL. Lifestyle modifications discussed with the patient. Continue statin therapy.  Tobacco use Importance of smoking cessation discussed with the patient. She is trying to cut down.  Follow-up: Return to clinic in 6 weeks.   [name], MD 01/26/2017 11:41  AM

## 2017-01-24 NOTE — Patient Instructions (Addendum)
Testing/Procedures: Your physician has requested that you have an echocardiogram. Echocardiography is a painless test that uses sound waves to create images of your heart. It provides your doctor with information about the size and shape of your heart and how well your heart's chambers and valves are working. This procedure takes approximately one hour. There are no restrictions for this procedure.  Your physician has requested that you have a carotid duplex. This test is an ultrasound of the carotid arteries in your neck. It looks at blood flow through these arteries that supply the brain with blood. Allow one hour for this exam. There are no restrictions or special instructions.    Follow-Up: Your physician recommends that you schedule a follow-up appointment in: 6 weeks with Dr. Saunders Revel.  It was a pleasure seeing you today here in the office. Please do not hesitate to give [place] a call back if you have any further questions. [place], BSN    Echocardiogram An echocardiogram, or echocardiography, uses sound waves (ultrasound) to produce an image of your heart. The echocardiogram is simple, painless, obtained within a short period of time, and offers valuable information to your health care provider. The images from an echocardiogram can provide information such as:  Evidence of coronary artery disease (CAD).  Heart size.  Heart muscle function.  Heart valve function.  Aneurysm detection.  Evidence of a past heart attack.  Fluid buildup around the heart.  Heart muscle thickening.  Assess heart valve function. Tell a health care provider about:  Any allergies you have.  All medicines you are taking, including vitamins, herbs, eye drops, creams, and over-the-counter medicines.  Any problems you or family members have had with anesthetic medicines.  Any blood disorders you have.  Any surgeries you have had.  Any medical conditions you have.  Whether you are  pregnant or may be pregnant. What happens before the procedure? No special preparation is needed. Eat and drink normally. What happens during the procedure?  In order to produce an image of your heart, gel will be applied to your chest and a wand-like tool (transducer) will be moved over your chest. The gel will help transmit the sound waves from the transducer. The sound waves will harmlessly bounce off your heart to allow the heart images to be captured in real-time motion. These images will then be recorded.  You may need an IV to receive a medicine that improves the quality of the pictures. What happens after the procedure? You may return to your normal schedule including diet, activities, and medicines, unless your health care provider tells you otherwise. This information is not intended to replace advice given to you by your health care provider. Make sure you discuss any questions you have with your health care provider. Document Released: 10/12/2000 Document Revised: 06/02/2016 Document Reviewed: 06/22/2013 Elsevier Interactive Patient Education  2017 [employer]  Carotid Artery Disease The carotid arteries are arteries on both sides of the neck. They carry blood to the brain. Carotid artery disease is when the arteries get smaller (narrow) or get blocked. If these arteries get smaller or get blocked, you are more likely to have a stroke or warning stroke (transient ischemic attack). Follow these instructions at home:  Take medicines as told by your doctor. Make sure you understand all your medicine instructions. Do not stop your medicines without talking to your doctor first.  Follow your doctor's diet instructions. It is important to eat a healthy diet that includes  plenty of:  Fresh fruits.  Vegetables.  Lean meats.  Avoid:  High-fat foods.  High-sodium foods.  Foods that are fried, overly processed, or have poor nutritional value.  Stay a healthy weight.  Stay  active. Get at least 30 minutes of activity every day.  Do not smoke.  Limit alcohol use to:  No more than 2 drinks a day for men.  No more than 1 drink a day for women who are not pregnant.  Do not use illegal drugs.  Keep all doctor visits as told. Get help right away if:  You have sudden weakness or loss of feeling (numbness) on one side of the body, such as the face, arm, or leg.  You have sudden confusion.  You have trouble speaking (aphasia) or understanding.  You have sudden trouble seeing out of one or both eyes.  You have sudden trouble walking.  You have dizziness or feel like you might pass out (faint).  You have a loss of balance or your movements are not steady (uncoordinated).  You have a sudden, severe headache with no known cause.  You have trouble swallowing (dysphagia). Call your local emergency services (911 in [place] Do notdrive yourself to the clinic or hospital. This information is not intended to replace advice given to you by your health care provider. Make sure you discuss any questions you have with your health care provider. Document Released: 10/01/2012 Document Revised: 03/22/2016 Document Reviewed: 04/15/2013 Elsevier Interactive Patient Education  2017 [employer].

## 2017-01-26 DIAGNOSIS — R0600 Dyspnea, unspecified: Secondary | ICD-10-CM | POA: Insufficient documentation

## 2017-01-26 DIAGNOSIS — R0609 Other forms of dyspnea: Principal | ICD-10-CM

## 2017-01-26 DIAGNOSIS — R0602 Shortness of breath: Secondary | ICD-10-CM | POA: Insufficient documentation

## 2017-01-26 DIAGNOSIS — R011 Cardiac murmur, unspecified: Secondary | ICD-10-CM | POA: Insufficient documentation

## 2017-01-27 ENCOUNTER — Emergency Department
Admission: EM | Admit: 2017-01-27 | Discharge: 2017-01-27 | Disposition: A | Discharge status: Home / Self Care | Payer: Medicare HMO | Attending: Emergency Medicine | Admitting: Emergency Medicine

## 2017-01-27 ENCOUNTER — Encounter: Payer: Self-pay | Admitting: Emergency Medicine

## 2017-01-27 DIAGNOSIS — F17210 Nicotine dependence, cigarettes, uncomplicated: Secondary | ICD-10-CM | POA: Diagnosis not present

## 2017-01-27 DIAGNOSIS — Z791 Long term (current) use of non-steroidal anti-inflammatories (NSAID): Secondary | ICD-10-CM | POA: Diagnosis not present

## 2017-01-27 DIAGNOSIS — I10 Essential (primary) hypertension: Secondary | ICD-10-CM | POA: Diagnosis not present

## 2017-01-27 DIAGNOSIS — R600 Localized edema: Secondary | ICD-10-CM | POA: Diagnosis not present

## 2017-01-27 DIAGNOSIS — R0602 Shortness of breath: Secondary | ICD-10-CM | POA: Diagnosis not present

## 2017-01-27 DIAGNOSIS — Z79899 Other long term (current) drug therapy: Secondary | ICD-10-CM | POA: Insufficient documentation

## 2017-01-27 DIAGNOSIS — R609 Edema, unspecified: Secondary | ICD-10-CM | POA: Diagnosis not present

## 2017-01-27 DIAGNOSIS — M7989 Other specified soft tissue disorders: Secondary | ICD-10-CM | POA: Diagnosis present

## 2017-01-27 LAB — BASIC METABOLIC PANEL
Anion gap: 10 (ref 5–15)
BUN: 15 mg/dL (ref 6–20)
CALCIUM: 9.3 mg/dL (ref 8.9–10.3)
CO2: 26 mmol/L (ref 22–32)
Chloride: 104 mmol/L (ref 101–111)
Creatinine, Ser: 0.86 mg/dL (ref 0.44–1.00)
GLUCOSE: 107 mg/dL — AB (ref 65–99)
POTASSIUM: 3.5 mmol/L (ref 3.5–5.1)
Sodium: 140 mmol/L (ref 135–145)

## 2017-01-27 LAB — CBC
HEMATOCRIT: 39.5 % (ref 35.0–47.0)
Hemoglobin: 13.9 g/dL (ref 12.0–16.0)
MCH: 31.7 pg (ref 26.0–34.0)
MCHC: 35.3 g/dL (ref 32.0–36.0)
MCV: 89.8 fL (ref 80.0–100.0)
PLATELETS: 277 10*3/uL (ref 150–440)
RBC: 4.39 MIL/uL (ref 3.80–5.20)
RDW: 12.6 % (ref 11.5–14.5)
WBC: 9.5 10*3/uL (ref 3.6–11.0)

## 2017-01-27 LAB — BRAIN NATRIURETIC PEPTIDE: B Natriuretic Peptide: 53 pg/mL (ref 0.0–100.0)

## 2017-01-27 LAB — TROPONIN I

## 2017-01-27 MED ORDER — FUROSEMIDE 20 MG PO TABS: 20.0000 mg | ORAL_TABLET | Freq: Every day | ORAL | 0 refills | Status: DC

## 2017-01-27 NOTE — ED Provider Notes (Signed)
[facility] Emergency Department Provider Note  Time seen: 6:55 PM  I have reviewed the triage vital signs and the nursing notes.   HISTORY  Chief Complaint Leg Swelling    HPI Kristin Parker is a 66 y.o. female with a past medical history of anxiety, hypertension, presents to the emergency department for peripheral edema. According to the patient actually 2 weeks ago her chlorthalidone was discontinued and instead replaced with 12.5 mg of hydrochlorothiazide. She states since that time she has noticed intermittent peripheral edema. She states is most pronounced when she is up on her feet throughout the day but appears to go down most evenings while lying flat. Patient saw her cardiologist 2 days ago and he has her scheduled for an echocardiogram. Patient denies any chest pain or shortness of breath.   Past Medical History:  Diagnosis Date  . Anxiety   . Bilateral carotid bruits   . Depression   . Hypertension   . Mixed hyperlipidemia   . Shoulder pain   . Sleeping difficulties     Patient Active Problem List   Diagnosis Date Noted  . Murmur 01/26/2017  . Dyspnea on exertion 01/26/2017  . Tobacco abuse 08/14/2016  . Left knee pain 06/12/2016  . Skin mass 06/12/2016  . Acute cystitis without hematuria 12/22/2015  . Urine frequency 12/22/2015  . HBP (high blood pressure) 08/18/2015  . Hyperlipidemia 08/18/2015  . Chronic anxiety 08/18/2015  . Seasonal allergies 08/18/2015  . Carotid bruit present 08/18/2015    Past Surgical History:  Procedure Laterality Date  . CATARACT EXTRACTION Left    2017  . COLONOSCOPY  2012   Dr Candace Cruise    Prior to Admission medications   Medication Sig Start Date End Date Taking? Authorizing Provider  diphenhydrAMINE (BENADRYL) 25 MG tablet Take 25 mg by mouth at bedtime.     Historical Provider, MD  fluticasone (FLONASE) 50 MCG/ACT nasal spray Place 1 spray into both nostrils daily. 07/08/15   Arlis Porta., MD   hydrochlorothiazide (HYDRODIURIL) 12.5 MG tablet Take 1 tablet (12.5 mg total) by mouth daily. 01/15/17   Arlis Porta., MD  ibuprofen (ADVIL,MOTRIN) 600 MG tablet take 1 tablet by mouth three times a day Patient not taking: Reported on 01/24/2017 10/02/16   Arlis Porta., MD  losartan (COZAAR) 100 MG tablet take 1 tablet by mouth once daily 07/05/16   Arlis Porta., MD  meloxicam (MOBIC) 15 MG tablet Take 1 tablet (15 mg total) by mouth daily. Patient taking differently: Take 15 mg by mouth daily as needed.  06/12/16   Arlis Porta., MD  metoprolol tartrate (LOPRESSOR) 25 MG tablet Take 1/2 tablet twice a day 12/18/16   Arlis Porta., MD  sertraline (ZOLOFT) 100 MG tablet Take 1 tablet (100 mg total) by mouth daily. 09/04/16   Arlis Porta., MD  simvastatin (ZOCOR) 40 MG tablet Take 1 tablet (40 mg total) by mouth at bedtime. 09/04/16   Arlis Porta., MD    Allergies  Allergen Reactions  . Aleve [Naproxen Sodium] Swelling    Lips. But patient can take Ibuprofen without problems    Family History  Problem Relation Age of Onset  . Cancer Mother     bladder cancer  . Heart disease Mother 59    Pacemaker  . Cancer Father     lung  . Multiple sclerosis Sister   . Valvular heart disease Brother  18    s/p bioprosthetic valve replacement at [facility]    Social History Social History  Substance Use Topics  . Smoking status: Current Every Day Smoker    Packs/day: 0.75    Years: 30.00    Types: Cigarettes  . Smokeless tobacco: Never Used  . Alcohol use No    Review of Systems Constitutional: Negative for fever. Cardiovascular: Negative for chest pain. Respiratory: Negative for shortness of breath. Gastrointestinal: Negative for abdominal pain Neurological: Negative for headaches, focal weakness or numbness. 10-point ROS otherwise negative.  ____________________________________________   PHYSICAL EXAM:  VITAL SIGNS: ED Triage Vitals  Enc  Vitals Group     BP 01/27/17 1449 118/62     Pulse Rate 01/27/17 1449 63     Resp 01/27/17 1449 18     Temp 01/27/17 1449 98.4 F (36.9 C)     Temp Source 01/27/17 1449 Oral     SpO2 01/27/17 1449 95 %     Weight 01/27/17 1450 159 lb 8 oz (72.3 kg)     Height 01/27/17 1450 5\' 7"  (1.702 m)     Head Circumference --      Peak Flow --      Pain Score --      Pain Loc --      Pain Edu? --      Excl. in [place]? --     Constitutional: Alert and oriented. Well appearing and in no distress. Eyes: Normal exam ENT   Head: Normocephalic and atraumatic.   Mouth/Throat: Mucous membranes are moist. Cardiovascular: Normal rate, regular rhythm. No murmur Respiratory: Normal respiratory effort without tachypnea nor retractions. Breath sounds are clear  Gastrointestinal: Soft and nontender. No distention. Musculoskeletal: Nontender with normal range of motion in all extremities. Minimal pedal edema equal bilaterally. No pitting edema over the tibia. No calf tenderness. Neurologic:  Normal speech and language. No gross focal neurologic deficits  Skin:  Skin is warm, dry and intact.  Psychiatric: Mood and affect are normal  ____________________________________________   INITIAL IMPRESSION / ASSESSMENT AND PLAN / ED COURSE  Pertinent labs & imaging results that were available during my care of the patient were reviewed by me and considered in my medical decision making (see chart for details).  The patient presents to the emergency department intermittent peripheral edema over the past 2 weeks since stopping chlorthalidone. Overall the patient appears well, labs are normal including BNP and troponin. Patient denies any chest pain or shortness of breath now or at any time. Given the intermittent peripheral edema I discussed starting the patient on a very low-dose of Lasix to be taken as needed for no more than 3 days in a row. Patient will follow-up with her cardiologist as scheduled for her  echocardiogram. patient is asking to be discharged home.  ____________________________________________   FINAL CLINICAL IMPRESSION(S) / ED DIAGNOSES  Peripheral edema    Harvest Dark, MD 01/27/17 1858

## 2017-01-27 NOTE — ED Triage Notes (Signed)
Has seen her cardiologist for fluid in her stomach, states has appts for echo, carotid US, etc to find the cause per pt. Medicines changed to hctz. Does not take lasix. States bp has been low with her new medications.

## 2017-01-27 NOTE — ED Notes (Signed)
AAOx3.  Skin warm and dry.  No SOB/ DOE.  Continue to monitor.

## 2017-01-30 DIAGNOSIS — J329 Chronic sinusitis, unspecified: Secondary | ICD-10-CM | POA: Diagnosis not present

## 2017-01-30 DIAGNOSIS — M950 Acquired deformity of nose: Secondary | ICD-10-CM | POA: Diagnosis not present

## 2017-01-30 DIAGNOSIS — J343 Hypertrophy of nasal turbinates: Secondary | ICD-10-CM | POA: Diagnosis not present

## 2017-01-30 DIAGNOSIS — J342 Deviated nasal septum: Secondary | ICD-10-CM | POA: Diagnosis not present

## 2017-02-12 DIAGNOSIS — J342 Deviated nasal septum: Secondary | ICD-10-CM | POA: Diagnosis not present

## 2017-02-19 ENCOUNTER — Ambulatory Visit (INDEPENDENT_AMBULATORY_CARE_PROVIDER_SITE_OTHER): Payer: Medicare HMO | Admitting: Family Medicine

## 2017-02-19 ENCOUNTER — Encounter: Payer: Self-pay | Admitting: Family Medicine

## 2017-02-19 VITALS — BP 112/50 | HR 66 | Temp 98.4°F | Resp 16 | Ht 67.0 in | Wt 162.0 lb

## 2017-02-19 DIAGNOSIS — I10 Essential (primary) hypertension: Secondary | ICD-10-CM | POA: Diagnosis not present

## 2017-02-19 NOTE — Patient Instructions (Signed)
Thank you for coming in to clinic today.  1. Your BP is in normal range today. Lower number is 40-50 which is lower, pulse is appropriate. - I think there is a chance we can stop one of your BP medications, either the HCTZ 12.5mg  or Metoprolol 25 (Half tab 12.5mg ) twice daily. - As discussed, I would defer this decision until AFTER you have your heart and vascular testing done with ECHO and Carotids, and discuss this further with Dr End (Cardiology)  If you get symptoms of low blood pressure, dizziness, lightheadedness, nearly passing out or passing out, then may need to stop one or both meds immediately and may contact our office or Dr Darnelle Bos office or go to the hospital ED for further evaluation  Please schedule a follow-up appointment with Dr. Parks Ranger in 3 months for HTN  If you have any other questions or concerns, please feel free to call the clinic or send a message through [place]. You may also schedule an earlier appointment if necessary.  Nobie Putnam, DO [place]

## 2017-02-19 NOTE — Progress Notes (Signed)
Subjective:    Patient ID: Kristin Parker, female    DOB: Jul 16, 1951, 66 y.o.   MRN: [id number]  Kristin Parker is a 66 y.o. female presenting on 02/19/2017 for Hypertension   HPI   CHRONIC HTN: Reports has had difficult to control BP for over 1 year. She states previously never had dx HTN in past, but did not follow-up regularly with doctor before. Recent changes with visit in 11/2016 PCP reduced Metoprolol from 25mg  BID to half tab 12.5mg  BID, and last visit 12/2016, PCP changed from Chlorthalidone 25mg  to HCTZ 12.5mg  due to concerns of BP too low.  Martin Majestic to ED 01/27/17 with concern of peripheral edema, given Lasix but never filled this rx, does not have any further problems with swelling in LE Reports good compliance, took meds today. Tolerating well, w/o complaints. Lifestyle - limited exercise, has had problem with exertional dyspnea, see below - Additionally states she voided better before taking fluid pills, hard to describe feels like emptied bladder better, but also admits concern maybe not staying as well hydrated now Denies CP, dyspnea, HA, edema, dizziness / lightheadedness  Additional history - Followed by [facility] Cardiology Dr Harrell Gave End, last saw 01/24/17 for exertional dyspnea and episodes of lightheadedness among other symptoms attributed to BP meds. She is scheduled for upcoming ECHO and Carotid US, after referred to Cardiology with heart murmur and carotid bruit. Awaiting tests and follow-up within 1-2 weeks.  Social History  Substance Use Topics  . Smoking status: Current Every Day Smoker    Packs/day: 0.75    Years: 30.00    Types: Cigarettes  . Smokeless tobacco: Never Used  . Alcohol use No    Review of Systems Per HPI unless specifically indicated above     Objective:    BP (!) 112/50   Pulse 66   Temp 98.4 F (36.9 C) (Oral)   Resp 16   Ht 5\' 7"  (1.702 m)   Wt 162 lb (73.5 kg)   SpO2 96%   BMI 25.37 kg/m   Wt Readings from Last 3 Encounters:    02/19/17 162 lb (73.5 kg)  01/27/17 159 lb 8 oz (72.3 kg)  01/24/17 162 lb 4 oz (73.6 kg)    Physical Exam  Constitutional: She is oriented to person, place, and time. She appears well-developed and well-nourished. No distress.  Well-appearing, comfortable, cooperative  HENT:  Head: Normocephalic and atraumatic.  Mouth/Throat: Oropharynx is clear and moist.  Neck: Normal range of motion. Neck supple. No thyromegaly present.  Bilateral carotid bruits  Cardiovascular: Normal rate, regular rhythm and intact distal pulses.   Murmur (2/6 systolic murmur R sternal border) heard. Pulmonary/Chest: Breath sounds normal. No respiratory distress. She has no wheezes. She has no rales.  Musculoskeletal: She exhibits no edema (Resolved).  Lymphadenopathy:    She has no cervical adenopathy.  Neurological: She is alert and oriented to person, place, and time.  Skin: Skin is warm and dry. No rash noted. She is not diaphoretic. No erythema.  Psychiatric: She has a normal mood and affect. Her behavior is normal.  Well groomed, good eye contact, normal speech and thoughts  Nursing note and vitals reviewed.   Results for orders placed or performed during the hospital encounter of 01/27/17  CBC  Result Value Ref Range   WBC 9.5 3.6 - 11.0 K/uL   RBC 4.39 3.80 - 5.20 MIL/uL   Hemoglobin 13.9 12.0 - 16.0 g/dL   HCT 39.5 35.0 - 47.0 %  MCV 89.8 80.0 - 100.0 fL   MCH 31.7 26.0 - 34.0 pg   MCHC 35.3 32.0 - 36.0 g/dL   RDW 12.6 11.5 - 14.5 %   Platelets 277 150 - 440 K/uL  Basic metabolic panel  Result Value Ref Range   Sodium 140 135 - 145 mmol/L   Potassium 3.5 3.5 - 5.1 mmol/L   Chloride 104 101 - 111 mmol/L   CO2 26 22 - 32 mmol/L   Glucose, Bld 107 (H) 65 - 99 mg/dL   BUN 15 6 - 20 mg/dL   Creatinine, Ser 0.86 0.44 - 1.00 mg/dL   Calcium 9.3 8.9 - 10.3 mg/dL   GFR calc non Af Amer >60 >60 mL/min   GFR calc Af Amer >60 >60 mL/min   Anion gap 10 5 - 15  Brain natriuretic peptide (order  ONLY if patient c/o SOB)  Result Value Ref Range   B Natriuretic Peptide 53.0 0.0 - 100.0 pg/mL  Troponin I  Result Value Ref Range   Troponin I <0.03 <0.03 ng/mL      Assessment & Plan:   Problem List Items Addressed This Visit    Essential hypertension - Primary    BP improved today on lower BP med regimen, low normal with DBP 50. Pulse is 66, normal range. Clinically asymptomatic recently. Murmur/carotid bruits noted.  Plan: 1. No change to anti-HTN meds today. Continue Metoprolol 12.5mg  (Half 25 tabs) BID, HCTZ 12.5mg  for now 2. Monitor BP outside office, and keep track of symptomatic episodes 3. Will defer decision on changing anti-HTN meds for now until after Cardiology testing is complete. Also will appreciate input from Cardiology regarding if patient needs BB based on cardiac testing, or can consider discontinuing one of her meds, or even both and switch to alternative agent such as ACEi/ARB 4. Follow-up as needed within 6 weeks to 3 months after Cardiology         No orders of the defined types were placed in this encounter.     Follow up plan: Return in about 3 months (around 05/21/2017) for blood pressure.  Nobie Putnam, [facility] 02/20/2017, 6:24 AM

## 2017-02-20 NOTE — Assessment & Plan Note (Signed)
BP improved today on lower BP med regimen, low normal with DBP 50. Pulse is 66, normal range. Clinically asymptomatic recently. Murmur/carotid bruits noted.  Plan: 1. No change to anti-HTN meds today. Continue Metoprolol 12.5mg  (Half 25 tabs) BID, HCTZ 12.5mg  for now 2. Monitor BP outside office, and keep track of symptomatic episodes 3. Will defer decision on changing anti-HTN meds for now until after Cardiology testing is complete. Also will appreciate input from Cardiology regarding if patient needs BB based on cardiac testing, or can consider discontinuing one of her meds, or even both and switch to alternative agent such as ACEi/ARB 4. Follow-up as needed within 6 weeks to 3 months after Cardiology

## 2017-02-21 DIAGNOSIS — J342 Deviated nasal septum: Secondary | ICD-10-CM | POA: Diagnosis not present

## 2017-02-21 DIAGNOSIS — J3489 Other specified disorders of nose and nasal sinuses: Secondary | ICD-10-CM | POA: Diagnosis not present

## 2017-02-21 DIAGNOSIS — J329 Chronic sinusitis, unspecified: Secondary | ICD-10-CM | POA: Diagnosis not present

## 2017-03-01 ENCOUNTER — Ambulatory Visit (INDEPENDENT_AMBULATORY_CARE_PROVIDER_SITE_OTHER): Payer: Medicare HMO

## 2017-03-01 ENCOUNTER — Ambulatory Visit: Payer: Medicare HMO

## 2017-03-01 ENCOUNTER — Other Ambulatory Visit: Payer: Self-pay | Admitting: Internal Medicine

## 2017-03-01 ENCOUNTER — Other Ambulatory Visit: Payer: Self-pay

## 2017-03-01 DIAGNOSIS — R011 Cardiac murmur, unspecified: Secondary | ICD-10-CM

## 2017-03-01 DIAGNOSIS — R0989 Other specified symptoms and signs involving the circulatory and respiratory systems: Secondary | ICD-10-CM

## 2017-03-01 DIAGNOSIS — R0609 Other forms of dyspnea: Secondary | ICD-10-CM | POA: Diagnosis not present

## 2017-03-01 LAB — VAS US CAROTID
LCCAPSYS: 87 cm/s
LEFT ECA DIAS: -18 cm/s
LEFT VERTEBRAL DIAS: -17 cm/s
LICADDIAS: -18 cm/s
LICAPSYS: -109 cm/s
Left CCA dist dias: -19 cm/s
Left CCA dist sys: -67 cm/s
Left CCA prox dias: 22 cm/s
Left ICA dist sys: -113 cm/s
Left ICA prox dias: -38 cm/s
RIGHT ECA DIAS: 12 cm/s
RIGHT VERTEBRAL DIAS: -10 cm/s
Right CCA prox dias: 10 cm/s
Right CCA prox sys: 82 cm/s
Right cca dist sys: 148 cm/s

## 2017-03-05 ENCOUNTER — Ambulatory Visit (INDEPENDENT_AMBULATORY_CARE_PROVIDER_SITE_OTHER): Payer: Medicare HMO | Admitting: Family Medicine

## 2017-03-05 ENCOUNTER — Ambulatory Visit: Payer: Medicare HMO | Admitting: Family Medicine

## 2017-03-05 ENCOUNTER — Encounter: Payer: Self-pay | Admitting: Family Medicine

## 2017-03-05 VITALS — BP 123/63 | HR 68 | Temp 98.6°F | Resp 16 | Ht 67.0 in | Wt 157.0 lb

## 2017-03-05 DIAGNOSIS — L239 Allergic contact dermatitis, unspecified cause: Secondary | ICD-10-CM

## 2017-03-05 DIAGNOSIS — J3089 Other allergic rhinitis: Secondary | ICD-10-CM

## 2017-03-05 MED ORDER — FLUTICASONE PROPIONATE 50 MCG/ACT NA SUSP: 2.0000 | Freq: Every day | NASAL | 5 refills | Status: DC

## 2017-03-05 MED ORDER — FEXOFENADINE HCL 180 MG PO TABS: 180.0000 mg | ORAL_TABLET | Freq: Every day | ORAL | 11 refills | Status: DC

## 2017-03-05 MED ORDER — HYDROXYZINE HCL 10 MG PO TABS: 10.0000 mg | ORAL_TABLET | Freq: Three times a day (TID) | ORAL | 1 refills | Status: DC | PRN

## 2017-03-05 MED ORDER — TRIAMCINOLONE ACETONIDE 0.5 % EX CREA: 1.0000 "application " | TOPICAL_CREAM | Freq: Two times a day (BID) | CUTANEOUS | 1 refills | Status: DC

## 2017-03-05 MED ORDER — PREDNISONE 20 MG PO TABS: ORAL_TABLET | ORAL | 0 refills | Status: DC

## 2017-03-05 NOTE — Progress Notes (Signed)
Subjective:    Patient ID: Kristin Parker, female    DOB: 06-10-1951, 66 y.o.   MRN: [id number]  Kristin Parker is a 66 y.o. female presenting on 03/05/2017 for Rash (onset 4 days )  Patient presents for a same day appointment.  HPI   ALLERGIC REACTION / PRURITIC RASH: Reports symptoms started about 1-3 weeks ago with generalized itching (trunk and mid body to upper extremities, less involving legs), she often tried scratching it but no improvement, seems that itching is not improving and is significantly worsened if goes outside due to sun and outdoor exposure with known seasonal allergens. She does admit to taking Benadryl OTC 1 pill (thinks 25mg ) nightly for about 1 year, sometimes does not always take it, did not use any other topicals or treatments for past 1-2 weeks, but did try last night CEVA ointment for dry skin OTC. - Now worsening with raised red rash, seemed to worsening in past 24 hours, previously just had dry irritated skin without a distinct rash. No prior similar rash - Normally does not have problems with dry skin or flaking irritation. Has never followed with Dermatology. Has not used other OTC moisturizers or anti itch creams. Not tried OTC hydrocortisone - Denies any pustules or drainage of pus, bleeding, throat swelling, dyspnea, nausea, vomiting, abdominal pain, headaches, fevers, chills sweats  Social History  Substance Use Topics  . Smoking status: Current Every Day Smoker    Packs/day: 0.75    Years: 30.00    Types: Cigarettes  . Smokeless tobacco: Never Used  . Alcohol use No    Review of Systems Per HPI unless specifically indicated above     Objective:    BP 123/63   Pulse 68   Temp 98.6 F (37 C) (Oral)   Resp 16   Ht 5\' 7"  (1.702 m)   Wt 157 lb (71.2 kg)   BMI 24.59 kg/m   Wt Readings from Last 3 Encounters:  03/05/17 157 lb (71.2 kg)  02/19/17 162 lb (73.5 kg)  01/27/17 159 lb 8 oz (72.3 kg)    Physical Exam  Constitutional: She is  oriented to person, place, and time. She appears well-developed and well-nourished. No distress.  Well-appearing, comfortable, cooperative  HENT:  Head: Normocephalic and atraumatic.  Mouth/Throat: Oropharynx is clear and moist.  No oral lesions or ulceration  Eyes: Conjunctivae are normal. Right eye exhibits no discharge. Left eye exhibits no discharge.  Cardiovascular: Normal rate, regular rhythm, normal heart sounds and intact distal pulses.   No murmur heard. Pulmonary/Chest: Effort normal and breath sounds normal. No respiratory distress. She has no wheezes. She has no rales.  Good air movement  Musculoskeletal: She exhibits no edema.  Neurological: She is alert and oriented to person, place, and time.  Skin: Skin is warm and dry. Rash (Maculopapular rash bilateral arms and upper trunk and scalp, some evidence of linear scratching and scabs) noted. She is not diaphoretic. No erythema.  No significant extending erythema or swelling, no focal induration or fluctuance.  Significant chronic sun damage to skin with tan dry some cracking and fissuring appearance to skin overall.  Psychiatric: She has a normal mood and affect. Her behavior is normal.  Nursing note and vitals reviewed.    Bilateral forearms   Right Neck/Clavicular region       Assessment & Plan:   Problem List Items Addressed This Visit    Seasonal allergies    Likely contributing to current allergic response -  Refill Flonase - Start Fexofenadine 180mg  daily, had not been on regular anti-histamine, prior claritin / zyrtec failure reported in past      Relevant Medications   fluticasone (FLONASE) 50 MCG/ACT nasal spray   Allergic dermatitis - Primary    Recent itching few weeks now to worsening extensive allergic dermatitis rash arms, upper body/back, unclear etiology since no medication or exposure changes. - Hemodynamically stable, no respiratory symptoms, no concern for anaphylaxis >few weeks out  now  Plan: 1. Trial on oral prednisone taper over 7 days for itching, rash, allergy 2. Rx Hydroxyzine 10mg  TID PRN for itching 3. New rx Triamcinolone 0.5% cream - BID up to 1-2 weeks then PRN, use on arms and key areas, not all over entire rash due to extensive area, not use on face, may help heal and help itch 4. Treat underlying allergies - Fexofenadine, Flonase 5. If significant worsening, return criteria reviewed may go to Hospital ED if needed, or future consider allergy referral vs derm, most likely will take time to resolve rash may be several days to weeks      Relevant Medications   triamcinolone cream (KENALOG) 0.5 %   hydrOXYzine (ATARAX/VISTARIL) 10 MG tablet   predniSONE (DELTASONE) 20 MG tablet    Other Visit Diagnoses    Environmental and seasonal allergies       Relevant Medications   fexofenadine (ALLEGRA) 180 MG tablet      Meds ordered this encounter  Medications  . triamcinolone cream (KENALOG) 0.5 %    Sig: Apply 1 application topically 2 (two) times daily. To affected areas, for up to 2 weeks. Do not use on face.    Dispense:  30 g    Refill:  1  . hydrOXYzine (ATARAX/VISTARIL) 10 MG tablet    Sig: Take 1 tablet (10 mg total) by mouth 3 (three) times daily as needed for itching.    Dispense:  30 tablet    Refill:  1  . predniSONE (DELTASONE) 20 MG tablet    Sig: Take daily with food. Start with 60mg  (3 pills) x 2 days, then reduce to 40mg  (2 pills) x 2 days, then 20mg  (1 pill) x 3 days    Dispense:  13 tablet    Refill:  0  . fluticasone (FLONASE) 50 MCG/ACT nasal spray    Sig: Place 2 sprays into both nostrils daily.    Dispense:  16 g    Refill:  5  . fexofenadine (ALLEGRA) 180 MG tablet    Sig: Take 1 tablet (180 mg total) by mouth daily.    Dispense:  30 tablet    Refill:  11    Follow up plan: Return in about 3 weeks (around 03/26/2017), or if symptoms worsen or fail to improve, for allergic reaction / rash.  Nobie Putnam, [facility] 03/05/2017, 10:33 PM

## 2017-03-05 NOTE — Patient Instructions (Signed)
Thank you for coming to the clinic today.  1. I am not exactly sure cause of your symptoms, but it seems more like significant allergic reaction, possibly environmental, and with some chronic dry skin   Now with the allergic reaction, I am concerned that this is becoming more severe and extensive.  - Start taking Prednisone tablets daily with food, reduce dose every few days per bottle, completed in 7 days  - Also sent new rx Triamcinolone cream, this is topical steroid, DO NOT use on face   - Start Hydroxyzine anti itch pill, take as needed up to 3 times daily - if absolutely needed can take 2 pills at bedtime  STOP benadryl for now  Sent rx Fexofenadine (generic Allegra) may not be covered, start taking 180mg  daily after your symptoms improve within 1-2 weeks  The rash looks most consistent with eczema / allergic dermatitis, this can flare up and get worse due to a variety of factors (excessive dry skin from bathing/showering, soaps, cold weather / indoor heaters, outdoor exposures).  Use the topical steroid creams twice a day for up to 1 week, maximum duration of use per one flare is 10 to 14 days, then STOP using it and allow skin to recover. Caution with over-use may cause lightening of the skin.   For baths/showers, limit bathing to every other day if you can (max 1 x daily)  Use a gentle, unscented soap and lukewarm water (hot water is most irritating to skin) Never scrub skin with too much pressure, this causes more irritation. Pat skin dry, then leave it slightly damp. DO NOT scrub it dry. Apply steroid cream to skin and rub in all the way, wait 15 min, then apply a daily moisturizer (Vaseline, Eucerin, Aveeno). Continue daily moisturizer every day of the year (even after flare is resolved)  If develops redness, honey colored crust oozing, drainage of pus, bleeding, or redness / swelling, pain, please return for re-evaluation, may have become infected after scratching.  If  fever, sweats, chills, worsening rash, difficulty breathing, nausea, vomiting, can't take medicine, may need to go directly to hospital ED for evaluation  Please schedule a follow-up appointment with Dr. Parks Ranger in 1-2 weeks as needed for allergic reaction  If you have any other questions or concerns, please feel free to call the clinic or send a message through [place]. You may also schedule an earlier appointment if necessary.  Nobie Putnam, DO [place]

## 2017-03-05 NOTE — Assessment & Plan Note (Signed)
Recent itching few weeks now to worsening extensive allergic dermatitis rash arms, upper body/back, unclear etiology since no medication or exposure changes. - Hemodynamically stable, no respiratory symptoms, no concern for anaphylaxis >few weeks out now  Plan: 1. Trial on oral prednisone taper over 7 days for itching, rash, allergy 2. Rx Hydroxyzine 10mg  TID PRN for itching 3. New rx Triamcinolone 0.5% cream - BID up to 1-2 weeks then PRN, use on arms and key areas, not all over entire rash due to extensive area, not use on face, may help heal and help itch 4. Treat underlying allergies - Fexofenadine, Flonase 5. If significant worsening, return criteria reviewed may go to Hospital ED if needed, or future consider allergy referral vs derm, most likely will take time to resolve rash may be several days to weeks

## 2017-03-05 NOTE — Assessment & Plan Note (Addendum)
Likely contributing to current allergic response - Refill Flonase - Start Fexofenadine 180mg  daily, had not been on regular anti-histamine, prior claritin / zyrtec failure reported in past

## 2017-03-06 ENCOUNTER — Other Ambulatory Visit: Payer: Self-pay | Admitting: *Deleted

## 2017-03-06 DIAGNOSIS — I6529 Occlusion and stenosis of unspecified carotid artery: Secondary | ICD-10-CM

## 2017-03-06 DIAGNOSIS — Z79899 Other long term (current) drug therapy: Secondary | ICD-10-CM

## 2017-03-06 DIAGNOSIS — E7849 Other hyperlipidemia: Secondary | ICD-10-CM

## 2017-03-08 ENCOUNTER — Other Ambulatory Visit
Admission: RE | Admit: 2017-03-08 | Discharge: 2017-03-08 | Disposition: A | Discharge status: Home / Self Care | Payer: Medicare HMO | Source: Ambulatory Visit | Attending: Internal Medicine | Admitting: Internal Medicine

## 2017-03-08 DIAGNOSIS — E784 Other hyperlipidemia: Secondary | ICD-10-CM | POA: Insufficient documentation

## 2017-03-08 DIAGNOSIS — E7849 Other hyperlipidemia: Secondary | ICD-10-CM

## 2017-03-08 DIAGNOSIS — Z79899 Other long term (current) drug therapy: Secondary | ICD-10-CM | POA: Insufficient documentation

## 2017-03-08 LAB — LIPID PANEL
CHOLESTEROL: 204 mg/dL — AB (ref 0–200)
HDL: 40 mg/dL — AB (ref >40)
LDL Cholesterol: 125 mg/dL — ABNORMAL HIGH (ref 0–99)
TRIGLYCERIDES: 196 mg/dL — AB (ref <150)
Total CHOL/HDL Ratio: 5.1 RATIO
VLDL: 39 mg/dL (ref 0–40)

## 2017-03-12 ENCOUNTER — Ambulatory Visit (INDEPENDENT_AMBULATORY_CARE_PROVIDER_SITE_OTHER): Payer: Medicare HMO | Admitting: Internal Medicine

## 2017-03-12 ENCOUNTER — Encounter: Payer: Self-pay | Admitting: Internal Medicine

## 2017-03-12 VITALS — BP 126/60 | HR 62 | Ht 67.0 in | Wt 161.8 lb

## 2017-03-12 DIAGNOSIS — E782 Mixed hyperlipidemia: Secondary | ICD-10-CM | POA: Diagnosis not present

## 2017-03-12 DIAGNOSIS — R5383 Other fatigue: Secondary | ICD-10-CM

## 2017-03-12 DIAGNOSIS — I6523 Occlusion and stenosis of bilateral carotid arteries: Secondary | ICD-10-CM

## 2017-03-12 DIAGNOSIS — Z79899 Other long term (current) drug therapy: Secondary | ICD-10-CM | POA: Diagnosis not present

## 2017-03-12 DIAGNOSIS — R0609 Other forms of dyspnea: Secondary | ICD-10-CM | POA: Diagnosis not present

## 2017-03-12 MED ORDER — ASPIRIN EC 81 MG PO TBEC: 81.0000 mg | DELAYED_RELEASE_TABLET | Freq: Every day | ORAL | Status: AC

## 2017-03-12 MED ORDER — ROSUVASTATIN CALCIUM 40 MG PO TABS: 40.0000 mg | ORAL_TABLET | Freq: Every day | ORAL | 3 refills | Status: DC

## 2017-03-12 NOTE — Patient Instructions (Addendum)
Medication Instructions:  Your physician has recommended you make the following change in your medication:  1- STOP taking Simvastatin. 2- START taking Rosuvastatin 40 mg (1 tablet) by mouth once a day.   Labwork: Your physician recommends that you return for lab work in: 2 MONTHS (LIPID, ALT).   - Week of May 13, 2017. - You will need to be fasting. DO NOT EAT OR DRINK after midnight the morning of your lab work. - Please go to the [facility]. You will check in at the front desk to the right as you walk into the atrium. Valet Parking is offered if needed.     Testing/Procedures: none  Follow-Up: Your physician recommends that you schedule a follow-up appointment in: 3 MONTHS WITH DR END.    If you need a refill on your cardiac medications before your next appointment, please call your pharmacy.    Preventing High Cholesterol Cholesterol is a waxy, fat-like substance that your body needs in small amounts. Your liver makes all the cholesterol that your body needs. Having high cholesterol (hypercholesterolemia) increases your risk for heart disease and stroke. Extra (excess) cholesterol comes from the food you eat, such as animal-based fat (saturated fat) from meat and some dairy products. High cholesterol can often be prevented with diet and lifestyle changes. If you already have high cholesterol, you can control it with diet and lifestyle changes, as well as medicine. What nutrition changes can be made?  Eat less saturated fat. Foods that contain saturated fat include red meat and some dairy products.  Avoid processed meats, like bacon and lunch meats.  Avoid trans fats, which are found in margarine and some baked goods.  Avoid foods and beverages that have added sugars.  Eat more fruits, vegetables, and whole grains.  Choose healthy sources of protein, such as fish, poultry, and nuts.  Choose healthy sources of fat, such as:  Nuts.  Vegetable oils, especially  olive oil.  Fish that have healthy fats (omega-3 fatty acids), such as mackerel or salmon. What lifestyle changes can be made?  Lose weight if you are overweight. Losing 5-10 lb (2.3-4.5 kg) can help prevent or control high cholesterol and reduce your risk for diabetes and high blood pressure. Ask your health care provider to help you with a diet and exercise plan to safely lose weight.  Get enough exercise. Do at least 150 minutes of moderate-intensity exercise each week.  You could do this in short exercise sessions several times a day, or you could do longer exercise sessions a few times a week. For example, you could take a brisk 10-minute walk or bike ride, 3 times a day, for 5 days a week.  Do not smoke. If you need help quitting, ask your health care provider.  Limit your alcohol intake. If you drink alcohol, limit alcohol intake to no more than 1 drink a day for nonpregnant women and 2 drinks a day for men. One drink equals 12 oz of beer, 5 oz of wine, or 1 oz of hard liquor. Why are these changes important? If you have high cholesterol, deposits (plaques) may build up on the walls of your blood vessels. Plaques make the arteries narrower and stiffer, which can restrict or block blood flow and cause blood clots to form. This greatly increases your risk for heart attack and stroke. Making diet and lifestyle changes can reduce your risk for these life-threatening conditions. What can I do to lower my risk?  Manage your risk  factors for high cholesterol. Talk with your health care provider about all of your risk factors and how to lower your risk.  Manage other conditions that you have, such as diabetes or high blood pressure (hypertension).  Have your cholesterol checked at regular intervals.  Keep all follow-up visits as told by your health care provider. This is important. How is this treated? In addition to diet and lifestyle changes, your health care provider may recommend  medicines to help lower cholesterol, such as a medicine to reduce the amount of cholesterol made in your liver. You may need medicine if:  Diet and lifestyle changes do not lower your cholesterol enough.  You have high cholesterol and other risk factors for heart disease or stroke. Take over-the-counter and prescription medicines only as told by your health care provider. Where to find more information:  American Heart Association: ThisTune.com.pt.jsp  National Heart, Lung, and Blood Institute: FrenchToiletries.com.cy Summary  High cholesterol increases your risk for heart disease and stroke. By keeping your cholesterol level low, you can reduce your risk for these conditions.  Diet and lifestyle changes are the most important steps in preventing high cholesterol.  Work with your health care provider to manage your risk factors, and have your blood tested regularly. This information is not intended to replace advice given to you by your health care provider. Make sure you discuss any questions you have with your health care provider. Document Released: 10/30/2015 Document Revised: 06/23/2016 Document Reviewed: 06/23/2016 Elsevier Interactive Patient Education  2017 [place] DASH stands for "Dietary Approaches to Stop Hypertension." The DASH eating plan is a healthy eating plan that has been shown to reduce high blood pressure (hypertension). It may also reduce your risk for type 2 diabetes, heart disease, and stroke. The DASH eating plan may also help with weight loss. What are tips for following this plan? General guidelines   Avoid eating more than 2,300 mg (milligrams) of salt (sodium) a day. If you have hypertension, you may need to reduce your sodium intake to 1,500 mg a day.  Limit alcohol intake to no more than 1 drink a day for nonpregnant  women and 2 drinks a day for men. One drink equals 12 oz of beer, 5 oz of wine, or 1 oz of hard liquor.  Work with your health care provider to maintain a healthy body weight or to lose weight. Ask what an ideal weight is for you.  Get at least 30 minutes of exercise that causes your heart to beat faster (aerobic exercise) most days of the week. Activities may include walking, swimming, or biking.  Work with your health care provider or diet and nutrition specialist (dietitian) to adjust your eating plan to your individual calorie needs. Reading food labels   Check food labels for the amount of sodium per serving. Choose foods with less than 5 percent of the Daily Value of sodium. Generally, foods with less than 300 mg of sodium per serving fit into this eating plan.  To find whole grains, look for the word "whole" as the first word in the ingredient list. Shopping   Buy products labeled as "low-sodium" or "no salt added."  Buy fresh foods. Avoid canned foods and premade or frozen meals. Cooking   Avoid adding salt when cooking. Use salt-free seasonings or herbs instead of table salt or sea salt. Check with your health care provider or pharmacist before using salt substitutes.  Do not fry foods. Cook foods using  healthy methods such as baking, boiling, grilling, and broiling instead.  Cook with heart-healthy oils, such as olive, canola, soybean, or sunflower oil. Meal planning    Eat a balanced diet that includes:  5 or more servings of fruits and vegetables each day. At each meal, try to fill half of your plate with fruits and vegetables.  Up to 6-8 servings of whole grains each day.  Less than 6 oz of lean meat, poultry, or fish each day. A 3-oz serving of meat is about the same size as a deck of cards. One egg equals 1 oz.  2 servings of low-fat dairy each day.  A serving of nuts, seeds, or beans 5 times each week.  Heart-healthy fats. Healthy fats called Omega-3 fatty  acids are found in foods such as flaxseeds and coldwater fish, like sardines, salmon, and mackerel.  Limit how much you eat of the following:  Canned or prepackaged foods.  Food that is high in trans fat, such as fried foods.  Food that is high in saturated fat, such as fatty meat.  Sweets, desserts, sugary drinks, and other foods with added sugar.  Full-fat dairy products.  Do not salt foods before eating.  Try to eat at least 2 vegetarian meals each week.  Eat more home-cooked food and less restaurant, buffet, and fast food.  When eating at a restaurant, ask that your food be prepared with less salt or no salt, if possible. What foods are recommended? The items listed may not be a complete list. Talk with your dietitian about what dietary choices are best for you. Grains  Whole-grain or whole-wheat bread. Whole-grain or whole-wheat pasta. Brown rice. [place]. Bulgur. Whole-grain and low-sodium cereals. Pita bread. Low-fat, low-sodium crackers. Whole-wheat flour tortillas. Vegetables  Fresh or frozen vegetables (raw, steamed, roasted, or grilled). Low-sodium or reduced-sodium tomato and vegetable juice. Low-sodium or reduced-sodium tomato sauce and tomato paste. Low-sodium or reduced-sodium canned vegetables. Fruits  All fresh, dried, or frozen fruit. Canned fruit in natural juice (without added sugar). Meat and other protein foods  Skinless chicken or [place]. Ground chicken or [place]. Pork with fat trimmed off. Fish and seafood. Egg whites. Dried beans, peas, or lentils. Unsalted nuts, nut butters, and seeds. Unsalted canned beans. Lean cuts of beef with fat trimmed off. Low-sodium, lean deli meat. Dairy  Low-fat (1%) or fat-free (skim) milk. Fat-free, low-fat, or reduced-fat cheeses. Nonfat, low-sodium ricotta or cottage cheese. Low-fat or nonfat yogurt. Low-fat, low-sodium cheese. Fats and oils  Soft margarine without trans fats. Vegetable oil. Low-fat, reduced-fat, or  light mayonnaise and salad dressings (reduced-sodium). Canola, safflower, olive, soybean, and sunflower oils. Avocado. Seasoning and other foods  Herbs. Spices. Seasoning mixes without salt. Unsalted popcorn and pretzels. Fat-free sweets. What foods are not recommended? The items listed may not be a complete list. Talk with your dietitian about what dietary choices are best for you. Grains  Baked goods made with fat, such as croissants, muffins, or some breads. Dry pasta or rice meal packs. Vegetables  Creamed or fried vegetables. Vegetables in a cheese sauce. Regular canned vegetables (not low-sodium or reduced-sodium). Regular canned tomato sauce and paste (not low-sodium or reduced-sodium). Regular tomato and vegetable juice (not low-sodium or reduced-sodium). Angie Fava. Olives. Fruits  Canned fruit in a light or heavy syrup. Fried fruit. Fruit in cream or butter sauce. Meat and other protein foods  Fatty cuts of meat. Ribs. Fried meat. Berniece Salines. Sausage. Bologna and other processed lunch meats. Salami. Fatback. Hotdogs. Bratwurst. Salted  nuts and seeds. Canned beans with added salt. Canned or smoked fish. Whole eggs or egg yolks. Chicken or [place] with skin. Dairy  Whole or 2% milk, cream, and half-and-half. Whole or full-fat cream cheese. Whole-fat or sweetened yogurt. Full-fat cheese. Nondairy creamers. Whipped toppings. Processed cheese and cheese spreads. Fats and oils  Butter. Stick margarine. Lard. Shortening. Ghee. Bacon fat. Tropical oils, such as coconut, palm kernel, or palm oil. Seasoning and other foods  Salted popcorn and pretzels. Onion salt, garlic salt, seasoned salt, table salt, and sea salt. Worcestershire sauce. Tartar sauce. Barbecue sauce. Teriyaki sauce. Soy sauce, including reduced-sodium. Steak sauce. Canned and packaged gravies. Fish sauce. Oyster sauce. Cocktail sauce. Horseradish that you find on the shelf. Ketchup. Mustard. Meat flavorings and tenderizers. Bouillon cubes.  Hot sauce and Tabasco sauce. Premade or packaged marinades. Premade or packaged taco seasonings. Relishes. Regular salad dressings. Where to find more information:  National Heart, Lung, and [place]: https://wilson-eaton.com/  American Heart Association: www.heart.org Summary  The DASH eating plan is a healthy eating plan that has been shown to reduce high blood pressure (hypertension). It may also reduce your risk for type 2 diabetes, heart disease, and stroke.  With the DASH eating plan, you should limit salt (sodium) intake to 2,300 mg a day. If you have hypertension, you may need to reduce your sodium intake to 1,500 mg a day.  When on the DASH eating plan, aim to eat more fresh fruits and vegetables, whole grains, lean proteins, low-fat dairy, and heart-healthy fats.  Work with your health care provider or diet and nutrition specialist (dietitian) to adjust your eating plan to your individual calorie needs. This information is not intended to replace advice given to you by your health care provider. Make sure you discuss any questions you have with your health care provider. Document Released: 10/04/2011 Document Revised: 10/08/2016 Document Reviewed: 10/08/2016 Elsevier Interactive Patient Education  2017 [employer].

## 2017-03-12 NOTE — Progress Notes (Signed)
Follow-up Outpatient Visit Date: 03/12/2017  Primary Care Provider: [place] [address]  Chief Complaint: Fatigue and rash  HPI:  Kristin Parker is a 66 y.o. year-old female with history of  hypertension, hyperlipidemia, depression, and anxiety, who presents for follow-up. I last saw her on 01/24/17, at which time she noted exertional dyspnea for several months. She had been found to have a heart murmur as well as carotid bruits by her PCP, prompting referral to [place]. Subsequent echo showed aortic sclerosis and mild MR. Carotid Doppler showed mild to moderate bilateral carotid artery stenosis. Since our last visit, Kristin Parker's exertional fatigue has been unchanged. She typically feels well in the morning but becomes "run down" in the afternoon. She denies chest pain, shortness of breath at rest, orthopnea, PND, edema, and claudication. She has not had any palpitations since our last visit. She notes a pruritic rash involving her arms, neck, and torso a few weeks ago, for which she took prednisone and antihistamines with some relief. There were no recent medication changes or new exposures to explain the development of the rash. Overall, it is improving, though she continues to have some itching and "prickly" sensation.  --------------------------------------------------------------------------------------------------  Cardiovascular History & Procedures: Cardiovascular Problems:  Aortic sclerosis  Carotid artery stenosis (mild to moderate)  Risk Factors:  Cerebrovascular disease, hypertension, hyperlipidemia, tobacco use, and age greater than 45  Cath/PCI:  None  CV Surgery:  None  EP Procedures and Devices:  None  Non-Invasive Evaluation(s):  Carotid artery Duplex (03/01/17): Smooth plaque, bilaterally. 40-59% RICA stenosis. 3-54% LICA stenosis. Patent vertebral arteries with antegrade flow. Normal subclavian arteries, bilaterally.  TTE  (03/01/17): Normal LV size and function with LVEF 60-65% and grade 1 diastolic dysfunction. Mild MR. Normal RV size and function. Normal PA pressure.  Recent CV Pertinent Labs: Lab Results  Component Value Date   CHOL 204 (H) 03/08/2017   HDL 40 (L) 03/08/2017   LDLCALC 125 (H) 03/08/2017   TRIG 196 (H) 03/08/2017   CHOLHDL 5.1 03/08/2017   BNP 53.0 01/27/2017   K 3.5 01/27/2017   BUN 15 01/27/2017   CREATININE 0.86 01/27/2017   CREATININE 0.83 08/14/2016    Past medical and surgical history were reviewed and updated in EPIC.  Outpatient Encounter Prescriptions as of 03/12/2017  Medication Sig  . diphenhydrAMINE (BENADRYL) 25 MG tablet Take 25 mg by mouth at bedtime.   . fexofenadine (ALLEGRA) 180 MG tablet Take 1 tablet (180 mg total) by mouth daily.  . fluticasone (FLONASE) 50 MCG/ACT nasal spray Place 2 sprays into both nostrils daily.  . hydrochlorothiazide (HYDRODIURIL) 12.5 MG tablet Take 1 tablet (12.5 mg total) by mouth daily.  . hydrOXYzine (ATARAX/VISTARIL) 10 MG tablet Take 1 tablet (10 mg total) by mouth 3 (three) times daily as needed for itching.  Marland Kitchen ibuprofen (ADVIL,MOTRIN) 600 MG tablet take 1 tablet by mouth three times a day  . losartan (COZAAR) 100 MG tablet take 1 tablet by mouth once daily  . meloxicam (MOBIC) 15 MG tablet Take 1 tablet (15 mg total) by mouth daily. (Patient taking differently: Take 15 mg by mouth daily as needed. )  . metoprolol tartrate (LOPRESSOR) 25 MG tablet Take 1/2 tablet twice a day  . sertraline (ZOLOFT) 100 MG tablet Take 1 tablet (100 mg total) by mouth daily.  . simvastatin (ZOCOR) 40 MG tablet Take 1 tablet (40 mg total) by mouth at bedtime.  . triamcinolone cream (KENALOG) 0.5 %  Apply 1 application topically 2 (two) times daily. To affected areas, for up to 2 weeks. Do not use on face.  . [DISCONTINUED] predniSONE (DELTASONE) 20 MG tablet Take daily with food. Start with 60mg  (3 pills) x 2 days, then reduce to 40mg  (2 pills) x 2 days,  then 20mg  (1 pill) x 3 days (Patient not taking: Reported on 03/12/2017)   No facility-administered encounter medications on file as of 03/12/2017.     Allergies: Aleve [naproxen sodium]  Social History   Social History  . Marital status: Divorced    Spouse name: N/A  . Number of children: N/A  . Years of education: N/A   Occupational History  . Not on file.   Social History Main Topics  . Smoking status: Current Every Day Smoker    Packs/day: 0.75    Years: 30.00    Types: Cigarettes  . Smokeless tobacco: Never Used  . Alcohol use No  . Drug use: No  . Sexual activity: Not on file   Other Topics Concern  . Not on file   Social History Narrative  . No narrative on file    Family History  Problem Relation Age of Onset  . Cancer Mother        bladder cancer  . Heart disease Mother 28       Pacemaker  . Cancer Father        lung  . Multiple sclerosis Sister   . Valvular heart disease Brother 42       s/p bioprosthetic valve replacement at [facility]    Review of Systems: A 12-system review of systems was performed and was negative except as noted in the HPI.  --------------------------------------------------------------------------------------------------  Physical Exam: BP 126/60 (BP Location: Left Arm, Patient Position: Sitting, Cuff Size: Normal)   Pulse 62   Ht 5\' 7"  (1.702 m)   Wt 161 lb 12 oz (73.4 kg)   BMI 25.33 kg/m   General:  Overweight woman, seated comfortably in the exam room. HEENT: No conjunctival pallor or scleral icterus.  Moist mucous membranes.  OP clear. Neck: Supple without lymphadenopathy, thyromegaly, JVD, or HJR. Lungs: Normal work of breathing.  Clear to auscultation bilaterally without wheezes or crackles. Heart: Regular rate and rhythm with 2/6 systolic murmur loudest at the RUSB, No rubs or gallops.  Non-displaced PMI. Abd: Bowel sounds present.  Soft, NT/ND without hepatosplenomegaly Ext: No lower extremity edema.  Radial, PT, and  DP pulses are 2+ bilaterally. Skin: Excoriations and mild erythema on both upper extremities.  Lab Results  Component Value Date   WBC 9.5 01/27/2017   HGB 13.9 01/27/2017   HCT 39.5 01/27/2017   MCV 89.8 01/27/2017   PLT 277 01/27/2017    Lab Results  Component Value Date   NA 140 01/27/2017   K 3.5 01/27/2017   CL 104 01/27/2017   CO2 26 01/27/2017   BUN 15 01/27/2017   CREATININE 0.86 01/27/2017   GLUCOSE 107 (H) 01/27/2017   ALT 8 08/14/2016    Lab Results  Component Value Date   CHOL 204 (H) 03/08/2017   HDL 40 (L) 03/08/2017   LDLCALC 125 (H) 03/08/2017   TRIG 196 (H) 03/08/2017   CHOLHDL 5.1 03/08/2017    --------------------------------------------------------------------------------------------------  ASSESSMENT AND PLAN: Fatigue and dyspnea on exertion Symptoms are likely multifactorial. She does not have angina to suggest significant coronary insufficiency, though she has multiple risk factors. We discussed ischemia evaluation versus aggressive primary prevention and other conservative measures, including  smoking cessation, weight loss, and exercise. We have agreed to the latter. I have encouraged the patient to begin taking ASA 81 mg daily.  Carotid artery stenosis Mild left and moderate right internal carotid artery stenosis noted. The patient does not have any symptoms of stroke/TIA. We will add ASA 81 mg daily. We will also switch simvastatin to rosuvastatin 40 mg daily for improved lipid control. We will recheck a fasting lipid panel and ALT in ~2 months.  Hypertension Blood pressure is well-controlled today. We will not make any medication changes at this time.  Mixed hyperlipidemia Lifestyle modifications discussed at length with Kristin Parker. We will also switch simvastatin to rosuvastatin, as above.  Follow-up: Return to clinic in 3 months.  [name], MD 03/12/2017 8:20 PM

## 2017-05-07 ENCOUNTER — Encounter: Payer: Self-pay | Admitting: Family Medicine

## 2017-05-07 ENCOUNTER — Ambulatory Visit (INDEPENDENT_AMBULATORY_CARE_PROVIDER_SITE_OTHER): Payer: Medicare HMO | Admitting: Family Medicine

## 2017-05-07 VITALS — BP 139/60 | HR 61 | Temp 98.2°F | Resp 16 | Ht 67.0 in | Wt 160.0 lb

## 2017-05-07 DIAGNOSIS — J3089 Other allergic rhinitis: Secondary | ICD-10-CM

## 2017-05-07 DIAGNOSIS — L089 Local infection of the skin and subcutaneous tissue, unspecified: Secondary | ICD-10-CM

## 2017-05-07 DIAGNOSIS — L723 Sebaceous cyst: Secondary | ICD-10-CM

## 2017-05-07 DIAGNOSIS — I10 Essential (primary) hypertension: Secondary | ICD-10-CM | POA: Diagnosis not present

## 2017-05-07 DIAGNOSIS — L239 Allergic contact dermatitis, unspecified cause: Secondary | ICD-10-CM | POA: Diagnosis not present

## 2017-05-07 MED ORDER — TRIAMCINOLONE ACETONIDE 0.5 % EX CREA: 1.0000 "application " | TOPICAL_CREAM | Freq: Two times a day (BID) | CUTANEOUS | 1 refills | Status: DC

## 2017-05-07 MED ORDER — HYDROXYZINE HCL 10 MG PO TABS: 10.0000 mg | ORAL_TABLET | Freq: Three times a day (TID) | ORAL | 1 refills | Status: DC | PRN

## 2017-05-07 MED ORDER — FLUTICASONE PROPIONATE 50 MCG/ACT NA SUSP: 2.0000 | Freq: Every day | NASAL | 5 refills | Status: DC

## 2017-05-07 MED ORDER — AMOXICILLIN-POT CLAVULANATE 875-125 MG PO TABS: 1.0000 | ORAL_TABLET | Freq: Two times a day (BID) | ORAL | 0 refills | Status: DC

## 2017-05-07 NOTE — Assessment & Plan Note (Signed)
Stop HCTZ concern for potential allergic rash reaction, has been on for 4 months now Do not add back Chlorthalidone yet Monitor BP at home, return protocol given If needed in future consider half of Chlorthalidone 12.5mg  or adjust meds

## 2017-05-07 NOTE — Patient Instructions (Addendum)
Thank you for coming to the clinic today.  1. For Rash on Arms - STOP Hydrochlorothiazide - see if rash improves - Check BP regularly, write down, if < 140/90 stay off med, if higher then notify office we can try lower dose of previous med Chlorthalidone  Refilled itch medicine and cream  Referral to Dermatology call them in 2-3 weeks to confirm apt  [facility]   [place],  [id number] Hours: 8AM-5PM Phone: [phone]  Sarina Ser, MD Brendolyn Patty, MD  2. For back you have an infected sebaceous cyst  Try warm compresses to help it drain and heal. Not consistent with drainage today, will re-check next week  After 2 days then start antibiotic Augmentin  If develops redness, honey colored crust oozing, drainage of pus, bleeding, or redness / swelling, pain, please return for re-evaluation, may have become infected after scratching.  If fever, sweats, chills, worsening rash, difficulty breathing, nausea, vomiting, can't take medicine, may need to go directly to hospital ED for evaluation  Follow-up as scheduled  If you have any other questions or concerns, please feel free to call the clinic or send a message through [place]. You may also schedule an earlier appointment if necessary.  Kristin Putnam, DO [place]

## 2017-05-07 NOTE — Assessment & Plan Note (Signed)
Refilled Flonase

## 2017-05-07 NOTE — Assessment & Plan Note (Signed)
Interval improvement on prednisone then worsening again with persistent allergic dermatitis rash arms, upper body/back. Suspected possible trigger HCTZ thiazide, based on timeline  Plan: 1. STOP HCTZ - discussed will defer adding back other thiazide chlorthalidone that she previously took, concern that may be too potent, will have her monitor BP off HCTZ for now instead and follow-up as advised if BP uncontrolled 2. Refilled Hydroxyzine 10mg  TID PRN for itching 3. Refilled Triamcinolone 0.5% cream - BID up to 1-2 weeks then PRN 4. Continue to treat underlying allergies - Fexofenadine, Flonase 5. Referral to Dermatology Dr [facility] as requested by patient for further evaluation - additional counseling that likely worse with heat, sun exposure, chronic smoker all factors can worse dry itchy skin contribute to dermatitis

## 2017-05-07 NOTE — Progress Notes (Signed)
Subjective:    Patient ID: Kristin Parker, female    DOB: Sep 24, 1951, 66 y.o.   MRN: [id number]  Kristin Parker is a 66 y.o. female presenting on 05/07/2017 for Rash (as per pt thinks side effect of fluid pills onset month obtw has cyst on back onset week)  Patient presents for a same day appointment.  HPI   FOLLOW-UP ALLERGIC REACTION / PRURITIC RASH: - Last visit with me 03/05/17, for same problem initial visit allergic dermatitis, treated with oral prednisone, hydroxyzine and triamcinolone. See prior notes for background information. - Interval update with significant improvement early on with prednisone taper over 7 days, but did not completely resolve, only 70% better then intermittent worsening episodes over past 2 months, worse with heat and sun exposure. - Today patient reports still concern with same rash, same distribution, still using topical triamcinolone with mild relief, was taking Hydroxyzine with PRn itch relief, request refill. Now she is concerned that thinks it may be allergy to HCTZ BP med given to her by prior PCP 12/2016 was switched off Chlorthalidone 25mg  put on lower HCTZ 12.5mg , thinks this may be triggering rash based on her reported timeline. - Denies any new exposure, other rash, throat swelling, dyspnea, n/v  INFECTED SEBACEOUS CYST: - Additional complaint today, has cyst on back that thinks may be infected, onset within 1 week, not improving. Causing some pain and tenderness, some redness, but has not drained any pus. Has had prior cyst removed by general surgery before R lower leg / knee, some recurrence. This one has not been removed before. - Denies any fever/chills, sweats, spreading redness   Social History  Substance Use Topics  . Smoking status: Current Every Day Smoker    Packs/day: 0.75    Years: 30.00    Types: Cigarettes  . Smokeless tobacco: Current User  . Alcohol use No    Review of Systems Per HPI unless specifically indicated above     Objective:    BP 139/60   Pulse 61   Temp 98.2 F (36.8 C) (Oral)   Resp 16   Ht 5\' 7"  (1.702 m)   Wt 160 lb (72.6 kg)   BMI 25.06 kg/m   Wt Readings from Last 3 Encounters:  05/07/17 160 lb (72.6 kg)  03/12/17 161 lb 12 oz (73.4 kg)  03/05/17 157 lb (71.2 kg)    Physical Exam  Constitutional: She is oriented to person, place, and time. She appears well-developed and well-nourished. No distress.  Well-appearing, comfortable, cooperative  HENT:  Head: Normocephalic and atraumatic.  Mouth/Throat: Oropharynx is clear and moist.  No oral lesions or ulceration  Eyes: Conjunctivae are normal. Right eye exhibits no discharge. Left eye exhibits no discharge.  Cardiovascular: Normal rate, regular rhythm, normal heart sounds and intact distal pulses.   No murmur heard. Pulmonary/Chest: Effort normal and breath sounds normal. No respiratory distress. She has no wheezes. She has no rales.  Good air movement  Musculoskeletal: She exhibits no edema.  Neurological: She is alert and oriented to person, place, and time.  Skin: Skin is warm and dry. Rash (Essentially unchanged maculopapular rash bilateral arms and upper trunk and scalp, less evidence of linear scratching or excoriation.) noted. She is not diaphoretic. No erythema.  No significant extending erythema or swelling, no focal induration or fluctuance.  Significant chronic sun damage to skin with tan dry some cracking and fissuring appearance to skin overall.  Back: Mid thoracic, Right of midline with moderate sized palpable  area of induration with localized erythema 2 x 2 cm, without appreciable fluctuance on exam, no open ulceration or drainage. No purulence expressed. Mild tender. No extending erythema  Psychiatric: She has a normal mood and affect. Her behavior is normal.  Nursing note and vitals reviewed.    Bilateral forearms    Back         Assessment & Plan:   Problem List Items Addressed This Visit     Seasonal allergies    Refilled Flonase      Relevant Medications   fluticasone (FLONASE) 50 MCG/ACT nasal spray   Essential hypertension    Stop HCTZ concern for potential allergic rash reaction, has been on for 4 months now Do not add back Chlorthalidone yet Monitor BP at home, return protocol given If needed in future consider half of Chlorthalidone 12.5mg  or adjust meds      Allergic dermatitis - Primary    Interval improvement on prednisone then worsening again with persistent allergic dermatitis rash arms, upper body/back. Suspected possible trigger HCTZ thiazide, based on timeline  Plan: 1. STOP HCTZ - discussed will defer adding back other thiazide chlorthalidone that she previously took, concern that may be too potent, will have her monitor BP off HCTZ for now instead and follow-up as advised if BP uncontrolled 2. Refilled Hydroxyzine 10mg  TID PRN for itching 3. Refilled Triamcinolone 0.5% cream - BID up to 1-2 weeks then PRN 4. Continue to treat underlying allergies - Fexofenadine, Flonase 5. Referral to Dermatology Dr [facility] as requested by patient for further evaluation - additional counseling that likely worse with heat, sun exposure, chronic smoker all factors can worse dry itchy skin contribute to dermatitis      Relevant Medications   triamcinolone cream (KENALOG) 0.5 %   hydrOXYzine (ATARAX/VISTARIL) 10 MG tablet   Other Relevant Orders   Ambulatory referral to Dermatology    Other Visit Diagnoses    Infected sebaceous cyst      Consistent with new acute R mid back infected sebaceous cyst vs abscess with firm induration without surrounding cellulitis or any systemic symptoms, no evidence of open ulceration or drainage, no fluctuance. No history of recurrent abcesses in this or other areas, not consistent with chronic suppurativa hidradenitis. No recent antibiotics.  Plan: 1. Decision to not offer I&D today based on exam, feels mostly firm  induration and suspect deeper sebaceous cyst, instead will cover with antibiotics Augmentin BID x 10 days, warm compresses, may spontaneously drain or may resolve. 2. Re-check in office as scheduled for wellness within 1 week or if can wait until end of month apt with me, otherwise if worsening or not improving, would recommend referral to General Surgery or can follow-up with Dermatology for complete excision of sebaceous cyst to limit recurrence, advised I&D is highly likely to have recurrence without complete removal. 3. Return precautions given if worsening    Relevant Medications   amoxicillin-clavulanate (AUGMENTIN) 875-125 MG tablet      Meds ordered this encounter  Medications  . amoxicillin-clavulanate (AUGMENTIN) 875-125 MG tablet    Sig: Take 1 tablet by mouth 2 (two) times daily. Wait 2 days before starting, then take for 10 days    Dispense:  20 tablet    Refill:  0  . triamcinolone cream (KENALOG) 0.5 %    Sig: Apply 1 application topically 2 (two) times daily. To affected areas, for up to 2 weeks. Do not use on face.    Dispense:  30  g    Refill:  1  . hydrOXYzine (ATARAX/VISTARIL) 10 MG tablet    Sig: Take 1 tablet (10 mg total) by mouth 3 (three) times daily as needed for itching.    Dispense:  30 tablet    Refill:  1  . fluticasone (FLONASE) 50 MCG/ACT nasal spray    Sig: Place 2 sprays into both nostrils daily.    Dispense:  16 g    Refill:  5    Follow up plan: Return for already scheduled.  Nobie Putnam, [facility] 05/07/2017, 3:44 PM

## 2017-05-14 ENCOUNTER — Ambulatory Visit (INDEPENDENT_AMBULATORY_CARE_PROVIDER_SITE_OTHER): Payer: Medicare HMO

## 2017-05-14 VITALS — BP 128/64 | HR 82 | Temp 98.8°F | Resp 16 | Ht 67.0 in | Wt 158.2 lb

## 2017-05-14 DIAGNOSIS — Z0000 Encounter for general adult medical examination without abnormal findings: Secondary | ICD-10-CM | POA: Diagnosis not present

## 2017-05-14 NOTE — Progress Notes (Signed)
Subjective:   Kristin Parker is a 66 y.o. female who presents for Medicare Annual (Subsequent) preventive examination.  Review of Systems:   Cardiac Risk Factors include: advanced age (>90men, >40 women);smoking/ tobacco exposure;hypertension;dyslipidemia     Objective:     Vitals: BP 128/64 (BP Location: Left Arm, Patient Position: Sitting)   Pulse 82   Temp 98.8 F (37.1 C)   Resp 16   Ht 5\' 7"  (1.702 m)   Wt 158 lb 3.2 oz (71.8 kg)   BMI 24.78 kg/m   Body mass index is 24.78 kg/m.   Tobacco History  Smoking Status  . Current Every Day Smoker  . Packs/day: 0.75  . Years: 30.00  . Types: Cigarettes  Smokeless Tobacco  . Current User     Ready to quit: Yes Counseling given: Yes   Past Medical History:  Diagnosis Date  . Anxiety   . Aortic valve sclerosis   . Bilateral carotid bruits   . Carotid artery stenosis   . Depression   . Hypertension   . Mitral regurgitation   . Mixed hyperlipidemia   . Shoulder pain   . Sleeping difficulties    Past Surgical History:  Procedure Laterality Date  . CATARACT EXTRACTION Left    2017  . COLONOSCOPY  2012   Dr Candace Cruise   Family History  Problem Relation Age of Onset  . Cancer Mother        bladder cancer  . Heart disease Mother 41       Pacemaker  . Cancer Father        lung  . Multiple sclerosis Sister   . Valvular heart disease Brother 73       s/p bioprosthetic valve replacement at [facility]   History  Sexual Activity  . Sexual activity: Not on file    Outpatient Encounter Prescriptions as of 05/14/2017  Medication Sig  . amoxicillin-clavulanate (AUGMENTIN) 875-125 MG tablet Take 1 tablet by mouth 2 (two) times daily. Wait 2 days before starting, then take for 10 days  . aspirin EC 81 MG tablet Take 1 tablet (81 mg total) by mouth daily.  . cetirizine (ZYRTEC) 10 MG tablet Take 10 mg by mouth daily.  . diphenhydrAMINE (BENADRYL) 25 MG tablet Take 25 mg by mouth at bedtime.   . fluticasone (FLONASE) 50  MCG/ACT nasal spray Place 2 sprays into both nostrils daily.  Marland Kitchen losartan (COZAAR) 100 MG tablet take 1 tablet by mouth once daily  . meloxicam (MOBIC) 15 MG tablet Take 1 tablet (15 mg total) by mouth daily. (Patient taking differently: Take 15 mg by mouth daily as needed. )  . metoprolol tartrate (LOPRESSOR) 25 MG tablet Take 1/2 tablet twice a day  . rosuvastatin (CRESTOR) 40 MG tablet Take 1 tablet (40 mg total) by mouth daily.  . sertraline (ZOLOFT) 100 MG tablet Take 1 tablet (100 mg total) by mouth daily.  Marland Kitchen triamcinolone cream (KENALOG) 0.5 % Apply 1 application topically 2 (two) times daily. To affected areas, for up to 2 weeks. Do not use on face.  . hydrochlorothiazide (HYDRODIURIL) 12.5 MG tablet   . hydrOXYzine (ATARAX/VISTARIL) 10 MG tablet Take 1 tablet (10 mg total) by mouth 3 (three) times daily as needed for itching. (Patient not taking: Reported on 05/14/2017)  . ibuprofen (ADVIL,MOTRIN) 600 MG tablet take 1 tablet by mouth three times a day (Patient not taking: Reported on 05/07/2017)  . [DISCONTINUED] fexofenadine (ALLEGRA) 180 MG tablet Take 1 tablet (180 mg  total) by mouth daily. (Patient not taking: Reported on 05/14/2017)   No facility-administered encounter medications on file as of 05/14/2017.     Activities of Daily Living In your present state of health, do you have any difficulty performing the following activities: 05/14/2017 03/05/2017  Hearing? N N  Vision? N N  Difficulty concentrating or making decisions? N N  Walking or climbing stairs? N N  Dressing or bathing? N N  Doing errands, shopping? N N  Preparing Food and eating ? N -  Using the Toilet? N -  In the past six months, have you accidently leaked urine? N -  Do you have problems with loss of bowel control? N -  Managing your Medications? N -  Housekeeping or managing your Housekeeping? N -  Some recent data might be hidden    Patient Care Team: Olin Hauser, DO as PCP - General (Family  Medicine) Christene Lye, MD (General Surgery)    Assessment:     Exercise Activities and Dietary recommendations Current Exercise Habits: The patient does not participate in regular exercise at present, Exercise limited by: None identified  Goals    . Quit smoking / using tobacco          Smoking cessation discussed      Fall Risk Fall Risk  05/14/2017 12/18/2016 09/18/2016 06/12/2016 12/22/2015  Falls in the past year? No No No No No   Depression Screen PHQ 2/9 Scores 05/14/2017 12/18/2016 09/18/2016 06/12/2016  PHQ - 2 Score 0 0 1 0     Cognitive Function     6CIT Screen 05/14/2017  What Year? 0 points  What month? 0 points  What time? 0 points  Count back from 20 0 points  Months in reverse 0 points  Repeat phrase 0 points  Total Score 0     There is no immunization history on file for this patient. Screening Tests Health Maintenance  Topic Date Due  . DEXA SCAN  06/12/2017 (Originally 04/03/2014)  . Hepatitis C Screening  06/12/2017 (Originally 01-04-1951)  . PNA vac Low Risk Adult (1 of 2 - PCV13) 10/29/2017 (Originally 04/03/2014)  . MAMMOGRAM  06/12/2018 (Originally 04/03/2013)  . INFLUENZA VACCINE  05/29/2017  . TETANUS/TDAP  03/29/2021  . COLONOSCOPY  04/04/2021      Plan:  I have personally reviewed and addressed the Medicare Annual Wellness questionnaire and have noted the following in the patient's chart:  A. Medical and social history B. Use of alcohol, tobacco or illicit drugs  C. Current medications and supplements D. Functional ability and status E.  Nutritional status F.  Physical activity G. Advance directives H. List of other physicians I.  Hospitalizations, surgeries, and ER visits in previous 12 months J.  [place] such as hearing and vision if needed, cognitive and depression L. Referrals and appointments  In addition, I have reviewed and discussed with patient certain preventive protocols, quality metrics, and best  practice recommendations. A written personalized care plan for preventive services as well as general preventive health recommendations were provided to patient.   Signed,  Tyler Aas, LPN Nurse Health Advisor   MD Recommendations:none

## 2017-05-14 NOTE — Patient Instructions (Addendum)
Ms. Gasner , Thank you for taking time to come for your Medicare Wellness Visit. I appreciate your ongoing commitment to your health goals. Please review the following plan we discussed and let me know if I can assist you in the future.   Screening recommendations/referrals: Colonoscopy: Completed 04/05/2011 Mammogram: Completed 04/04/2011,declined Bone Density: due now-declined Recommended yearly ophthalmology/optometry visit for glaucoma screening and checkup Recommended yearly dental visit for hygiene and checkup  Vaccinations: Influenza vaccine: due 06/2017 Pneumococcal vaccine: due now-declined  Tdap vaccine: up to date Shingles vaccine: due now, check with your insurance company for coverage.  - declined   Advanced directives: Advance directive discussed with you today. I have provided a copy for you to complete at home and have notarized. Once this is complete please bring a copy in to our office so we can scan it into your chart.  Conditions/risks identified: smoking cessation discussed   Next appointment: Follow up with Dr.karamaelgos on 05/28/2017 at 2:20pm. Follow up in one year for your annual wellness exam.   Preventive Care 68 Years and Older, Female Preventive care refers to lifestyle choices and visits with your health care provider that can promote health and wellness. What does preventive care include?  A yearly physical exam. This is also called an annual well check.  Dental exams once or twice a year.  Routine eye exams. Ask your health care provider how often you should have your eyes checked.  Personal lifestyle choices, including:  Daily care of your teeth and gums.  Regular physical activity.  Eating a healthy diet.  Avoiding tobacco and drug use.  Limiting alcohol use.  Practicing safe sex.  Taking low-dose aspirin every day.  Taking vitamin and mineral supplements as recommended by your health care provider. What happens during an annual well  check? The services and screenings done by your health care provider during your annual well check will depend on your age, overall health, lifestyle risk factors, and family history of disease. Counseling  Your health care provider may ask you questions about your:  Alcohol use.  Tobacco use.  Drug use.  Emotional well-being.  Home and relationship well-being.  Sexual activity.  Eating habits.  History of falls.  Memory and ability to understand (cognition).  Work and work Statistician.  Reproductive health. Screening  You may have the following tests or measurements:  Height, weight, and BMI.  Blood pressure.  Lipid and cholesterol levels. These may be checked every 5 years, or more frequently if you are over 13 years old.  Skin check.  Lung cancer screening. You may have this screening every year starting at age 43 if you have a 30-pack-year history of smoking and currently smoke or have quit within the past 15 years.  Fecal occult blood test (FOBT) of the stool. You may have this test every year starting at age 84.  Flexible sigmoidoscopy or colonoscopy. You may have a sigmoidoscopy every 5 years or a colonoscopy every 10 years starting at age 6.  Hepatitis C blood test.  Hepatitis B blood test.  Sexually transmitted disease (STD) testing.  Diabetes screening. This is done by checking your blood sugar (glucose) after you have not eaten for a while (fasting). You may have this done every 1-3 years.  Bone density scan. This is done to screen for osteoporosis. You may have this done starting at age 71.  Mammogram. This may be done every 1-2 years. Talk to your health care provider about how often you should have  regular mammograms. Talk with your health care provider about your test results, treatment options, and if necessary, the need for more tests. Vaccines  Your health care provider may recommend certain vaccines, such as:  Influenza vaccine. This is  recommended every year.  Tetanus, diphtheria, and acellular pertussis (Tdap, Td) vaccine. You may need a Td booster every 10 years.  Zoster vaccine. You may need this after age 28.  Pneumococcal 13-valent conjugate (PCV13) vaccine. One dose is recommended after age 55.  Pneumococcal polysaccharide (PPSV23) vaccine. One dose is recommended after age 57. Talk to your health care provider about which screenings and vaccines you need and how often you need them. This information is not intended to replace advice given to you by your health care provider. Make sure you discuss any questions you have with your health care provider. Document Released: 11/11/2015 Document Revised: 07/04/2016 Document Reviewed: 08/16/2015 Elsevier Interactive Patient Education  2017 [place] Prevention in the Home Falls can cause injuries. They can happen to people of all ages. There are many things you can do to make your home safe and to help prevent falls. What can I do on the outside of my home?  Regularly fix the edges of walkways and driveways and fix any cracks.  Remove anything that might make you trip as you walk through a door, such as a raised step or threshold.  Trim any bushes or trees on the path to your home.  Use bright outdoor lighting.  Clear any walking paths of anything that might make someone trip, such as rocks or tools.  Regularly check to see if handrails are loose or broken. Make sure that both sides of any steps have handrails.  Any raised decks and porches should have guardrails on the edges.  Have any leaves, snow, or ice cleared regularly.  Use sand or salt on walking paths during winter.  Clean up any spills in your garage right away. This includes oil or grease spills. What can I do in the bathroom?  Use night lights.  Install grab bars by the toilet and in the tub and shower. Do not use towel bars as grab bars.  Use non-skid mats or decals in the tub or  shower.  If you need to sit down in the shower, use a plastic, non-slip stool.  Keep the floor dry. Clean up any water that spills on the floor as soon as it happens.  Remove soap buildup in the tub or shower regularly.  Attach bath mats securely with double-sided non-slip rug tape.  Do not have throw rugs and other things on the floor that can make you trip. What can I do in the bedroom?  Use night lights.  Make sure that you have a light by your bed that is easy to reach.  Do not use any sheets or blankets that are too big for your bed. They should not hang down onto the floor.  Have a firm chair that has side arms. You can use this for support while you get dressed.  Do not have throw rugs and other things on the floor that can make you trip. What can I do in the kitchen?  Clean up any spills right away.  Avoid walking on wet floors.  Keep items that you use a lot in easy-to-reach places.  If you need to reach something above you, use a strong step stool that has a grab bar.  Keep electrical cords out of the  way.  Do not use floor polish or wax that makes floors slippery. If you must use wax, use non-skid floor wax.  Do not have throw rugs and other things on the floor that can make you trip. What can I do with my stairs?  Do not leave any items on the stairs.  Make sure that there are handrails on both sides of the stairs and use them. Fix handrails that are broken or loose. Make sure that handrails are as long as the stairways.  Check any carpeting to make sure that it is firmly attached to the stairs. Fix any carpet that is loose or worn.  Avoid having throw rugs at the top or bottom of the stairs. If you do have throw rugs, attach them to the floor with carpet tape.  Make sure that you have a light switch at the top of the stairs and the bottom of the stairs. If you do not have them, ask someone to add them for you. What else can I do to help prevent  falls?  Wear shoes that:  Do not have high heels.  Have rubber bottoms.  Are comfortable and fit you well.  Are closed at the toe. Do not wear sandals.  If you use a stepladder:  Make sure that it is fully opened. Do not climb a closed stepladder.  Make sure that both sides of the stepladder are locked into place.  Ask someone to hold it for you, if possible.  Clearly mark and make sure that you can see:  Any grab bars or handrails.  First and last steps.  Where the edge of each step is.  Use tools that help you move around (mobility aids) if they are needed. These include:  Canes.  Walkers.  Scooters.  Crutches.  Turn on the lights when you go into a dark area. Replace any light bulbs as soon as they burn out.  Set up your furniture so you have a clear path. Avoid moving your furniture around.  If any of your floors are uneven, fix them.  If there are any pets around you, be aware of where they are.  Review your medicines with your doctor. Some medicines can make you feel dizzy. This can increase your chance of falling. Ask your doctor what other things that you can do to help prevent falls. This information is not intended to replace advice given to you by your health care provider. Make sure you discuss any questions you have with your health care provider. Document Released: 08/11/2009 Document Revised: 03/22/2016 Document Reviewed: 11/19/2014 Elsevier Interactive Patient Education  2017 [employer].   Steps to Quit Smoking Smoking tobacco can be bad for your health. It can also affect almost every organ in your body. Smoking puts you and people around you at risk for many serious long-lasting (chronic) diseases. Quitting smoking is hard, but it is one of the best things that you can do for your health. It is never too late to quit. What are the benefits of quitting smoking? When you quit smoking, you lower your risk for getting serious diseases and  conditions. They can include:  Lung cancer or lung disease.  Heart disease.  Stroke.  Heart attack.  Not being able to have children (infertility).  Weak bones (osteoporosis) and broken bones (fractures).  If you have coughing, wheezing, and shortness of breath, those symptoms may get better when you quit. You may also get sick less often. If you are  pregnant, quitting smoking can help to lower your chances of having a baby of low birth weight. What can I do to help me quit smoking? Talk with your doctor about what can help you quit smoking. Some things you can do (strategies) include:  Quitting smoking totally, instead of slowly cutting back how much you smoke over a period of time.  Going to in-person counseling. You are more likely to quit if you go to many counseling sessions.  Using resources and support systems, such as: ? Database administrator with a Social worker. ? Phone quitlines. ? Careers information officer. ? Support groups or group counseling. ? Text messaging programs. ? Mobile phone apps or applications.  Taking medicines. Some of these medicines may have nicotine in them. If you are pregnant or breastfeeding, do not take any medicines to quit smoking unless your doctor says it is okay. Talk with your doctor about counseling or other things that can help you.  Talk with your doctor about using more than one strategy at the same time, such as taking medicines while you are also going to in-person counseling. This can help make quitting easier. What things can I do to make it easier to quit? Quitting smoking might feel very hard at first, but there is a lot that you can do to make it easier. Take these steps:  Talk to your family and friends. Ask them to support and encourage you.  Call phone quitlines, reach out to support groups, or work with a Social worker.  Ask people who smoke to not smoke around you.  Avoid places that make you want (trigger) to smoke, such  as: ? Bars. ? Parties. ? Smoke-break areas at work.  Spend time with people who do not smoke.  Lower the stress in your life. Stress can make you want to smoke. Try these things to help your stress: ? Getting regular exercise. ? Deep-breathing exercises. ? Yoga. ? Meditating. ? Doing a body scan. To do this, close your eyes, focus on one area of your body at a time from head to toe, and notice which parts of your body are tense. Try to relax the muscles in those areas.  Download or buy apps on your mobile phone or tablet that can help you stick to your quit plan. There are many free apps, such as QuitGuide from the [employer] Office manager for Disease Control and Prevention). You can find more support from smokefree.gov and other websites.  This information is not intended to replace advice given to you by your health care provider. Make sure you discuss any questions you have with your health care provider. Document Released: 08/11/2009 Document Revised: 06/12/2016 Document Reviewed: 03/01/2015 Elsevier Interactive Patient Education  2018 [employer].

## 2017-05-21 ENCOUNTER — Telehealth: Payer: Self-pay | Admitting: *Deleted

## 2017-05-21 NOTE — Telephone Encounter (Signed)
Patient due for repeat fasting lipid profile and ALT. Patient reminded and verbalized understanding to be fasting and go to [facility] this week for lab work.

## 2017-05-28 ENCOUNTER — Other Ambulatory Visit: Payer: Self-pay | Admitting: *Deleted

## 2017-05-28 ENCOUNTER — Ambulatory Visit (INDEPENDENT_AMBULATORY_CARE_PROVIDER_SITE_OTHER): Payer: Medicare HMO | Admitting: Family Medicine

## 2017-05-28 ENCOUNTER — Other Ambulatory Visit
Admission: RE | Admit: 2017-05-28 | Discharge: 2017-05-28 | Disposition: A | Discharge status: Home / Self Care | Payer: Medicare HMO | Source: Ambulatory Visit | Attending: Internal Medicine | Admitting: Internal Medicine

## 2017-05-28 ENCOUNTER — Encounter: Payer: Self-pay | Admitting: Family Medicine

## 2017-05-28 VITALS — BP 133/56 | HR 57 | Temp 98.6°F | Resp 16 | Ht 67.0 in | Wt 158.0 lb

## 2017-05-28 DIAGNOSIS — F329 Major depressive disorder, single episode, unspecified: Secondary | ICD-10-CM

## 2017-05-28 DIAGNOSIS — E782 Mixed hyperlipidemia: Secondary | ICD-10-CM | POA: Insufficient documentation

## 2017-05-28 DIAGNOSIS — E785 Hyperlipidemia, unspecified: Secondary | ICD-10-CM

## 2017-05-28 DIAGNOSIS — L239 Allergic contact dermatitis, unspecified cause: Secondary | ICD-10-CM | POA: Diagnosis not present

## 2017-05-28 DIAGNOSIS — Z720 Tobacco use: Secondary | ICD-10-CM

## 2017-05-28 DIAGNOSIS — M25562 Pain in left knee: Secondary | ICD-10-CM

## 2017-05-28 DIAGNOSIS — Z79899 Other long term (current) drug therapy: Secondary | ICD-10-CM | POA: Insufficient documentation

## 2017-05-28 DIAGNOSIS — I10 Essential (primary) hypertension: Secondary | ICD-10-CM | POA: Diagnosis not present

## 2017-05-28 DIAGNOSIS — F32A Depression, unspecified: Secondary | ICD-10-CM

## 2017-05-28 DIAGNOSIS — G8929 Other chronic pain: Secondary | ICD-10-CM | POA: Diagnosis not present

## 2017-05-28 LAB — ALT: ALT: 16 U/L (ref 14–54)

## 2017-05-28 LAB — LIPID PANEL
CHOLESTEROL: 147 mg/dL (ref 0–200)
HDL: 32 mg/dL — ABNORMAL LOW (ref >40)
TRIGLYCERIDES: 417 mg/dL — AB (ref <150)
Total CHOL/HDL Ratio: 4.6 RATIO

## 2017-05-28 MED ORDER — BUPROPION HCL ER (SR) 150 MG PO TB12: ORAL_TABLET | ORAL | 2 refills | Status: DC

## 2017-05-28 MED ORDER — SERTRALINE HCL 100 MG PO TABS: 100.0000 mg | ORAL_TABLET | Freq: Every day | ORAL | 3 refills | Status: DC

## 2017-05-28 MED ORDER — MELOXICAM 15 MG PO TABS: 15.0000 mg | ORAL_TABLET | Freq: Every day | ORAL | 2 refills | Status: DC | PRN

## 2017-05-28 MED ORDER — HYDROCHLOROTHIAZIDE 12.5 MG PO TABS: 12.5000 mg | ORAL_TABLET | Freq: Every day | ORAL | 3 refills | Status: DC

## 2017-05-28 MED ORDER — LOSARTAN POTASSIUM 100 MG PO TABS: 100.0000 mg | ORAL_TABLET | Freq: Every day | ORAL | 3 refills | Status: DC

## 2017-05-28 NOTE — Progress Notes (Signed)
Subjective:    Patient ID: Kristin Parker, female    DOB: [birth year], 66 y.o.   MRN: [id number]  Kristin Parker is a 66 y.o. female presenting on 05/28/2017 for Hypertension   HPI   CHRONIC HTN: - Last visit with me 02/19/17, for initial visit for same problem HTN, no changes made continued recently lowered BP regimen (prior PCP lowered metoprolol to half tabs), discussion at that time of possible can DC either HCTZ or BB due to history of low BP. see prior notes for background information. - Interval update, She tried home trial over past few weeks of stopping HCTZ 12.5mg  and did not tolerate this had SBP up to 160-170 by report, then improved when back on it - Today patient reports no new concerns - Meds: HCTZ 12.5mg  daily, Metoprolol 12.5mg  BID (Half of 25mg  tabs), Losartan 100mg  - Followed by Cardiology, has next apt in about 2 weeks, no known CAD or other indication for BB Lifestyle: - Diet: limited salt - Exercise: limited due to chronic dyspnea, smoker - Admits improved edema Denies CP, dyspnea, HA, edema, dizziness / lightheadedness  FOLLOW-UP ALLERGIC REACTION / PRURITIC RASH: - Has not seen Dermatology yet, waiting for apt at [facility] her rash has much improved if out of sun and wearing sun screen as advised - Still would like to see Derm, she will call for apt  Tobacco Abuse - She is asking about possible help with quitting smoking - Quit for 3 years ago, cold [place], started again, around others who smoked - Currently not ready to quit - Now cut down now to half pdd, from 3/4 to 1ppd, smokes with stressors during the day, if active and staying busy won't smoke as much, if idle she smokes more - Does not want to take Chantix due to potential side effects. Interested in [place] - refilled Sertraline, may take Bupropion for smoking while on sertraline  Social History  Substance Use Topics  . Smoking status:  Current Every Day Smoker    Packs/day: 0.50    Years: 30.00    Types: Cigarettes  . Smokeless tobacco: Current User  . Alcohol use No    Review of Systems Per HPI unless specifically indicated above     Objective:    BP (!) 133/56   Pulse (!) 57   Temp 98.6 F (37 C) (Oral)   Resp 16   Ht 5\' 7"  (1.702 m)   Wt 158 lb (71.7 kg)   BMI 24.75 kg/m   Wt Readings from Last 3 Encounters:  05/28/17 158 lb (71.7 kg)  05/14/17 158 lb 3.2 oz (71.8 kg)  05/07/17 160 lb (72.6 kg)    Physical Exam  Constitutional: She is oriented to person, place, and time. She appears well-developed and well-nourished. No distress.  Well-appearing, comfortable, cooperative  HENT:  Head: Normocephalic and atraumatic.  Mouth/Throat: Oropharynx is clear and moist.  Neck: Normal range of motion. Neck supple. No thyromegaly present.  Cardiovascular: Normal rate, regular rhythm and intact distal pulses.   Murmur (2/6 systolic murmur R sternal border) heard. Pulmonary/Chest: Effort normal and breath sounds normal. No respiratory distress. She has no wheezes. She has no rales.  Musculoskeletal: She exhibits no edema (Resolved).  Lymphadenopathy:    She has no cervical adenopathy.  Neurological: She is alert and oriented to person, place, and time.  Skin: Skin is warm and dry. No rash noted. She is not diaphoretic. No erythema.  Psychiatric: She has a normal mood and affect. Her behavior is normal.  Well groomed, good eye contact, normal speech and thoughts  Nursing note and vitals reviewed.   Results for orders placed or performed during the hospital encounter of 05/28/17  Lipid panel  Result Value Ref Range   Cholesterol 147 0 - 200 mg/dL   Triglycerides 417 (H) <150 mg/dL   HDL 32 (L) >40 mg/dL   Total CHOL/HDL Ratio 4.6 RATIO   VLDL NOT CALCULATED 0 - 40 mg/dL   LDL Cholesterol NOT CALCULATED 0 - 99 mg/dL  ALT  Result Value Ref Range   ALT 16 14 - 54 U/L      Assessment & Plan:   Problem  List Items Addressed This Visit    Tobacco abuse    Chronic tobacco abuse >30 years, self tapering down but nearly ready to quit with assistance  Plan: 1. Start Wellbutrin SR 150mg  - start daily in AM for 3 days, then BID (8 hrs after 1st dose) avoid late afternoon/evening dosing to reduce insomnia / dry mouth. Given 1 month supply. Set quit date within 1-2 weeks, can continue smoking for now. Counseled on risks/benefits side effects of medication including mood disruptions and resulting behavior, advised to stop if any significant mood side effect. 2. Follow-up 4 weeks, monitor progress, if successful can continue up to max 12 weeks, if no improvement after 7 weeks will discontinue. Consider NRT patches vs gum.  Discussion today >5 minutes (<10 minutes) specifically on counseling on risks of tobacco use, complications, treatment, smoking cessation.      Relevant Medications   buPROPion (WELLBUTRIN SR) 150 MG 12 hr tablet   Left knee pain    Refill Meloxicam, take PRN      Relevant Medications   meloxicam (MOBIC) 15 MG tablet   Essential hypertension - Primary    Well-controlled HTN - Home BP readings improved  Complication with mild-mod carotid disease PAD   Plan:  1. Continue current BP regimen HCTZ 12.5mg  daily, Losartan 100mg , Metoprolol 12.5mg  BID (Half tab 25) - discussion on may not need metoprolol since low dose and if no indication for it, has low HR and concern may be causing her fatigue. 2. Encourage improved lifestyle - low sodium diet, regular exercise 3. Continue monitor BP outside office, bring readings to next visit, if persistently >140/90 or new symptoms notify office sooner 4. Follow-up with Cardiology Dr End - discussion options if need BB still or can adjust HTN regimen      Relevant Medications   hydrochlorothiazide (HYDRODIURIL) 12.5 MG tablet   losartan (COZAAR) 100 MG tablet   Allergic dermatitis    Improved with less sunlight and sunscreen Follow-up with  [facility] Confirmed apt in 06/2017       Other Visit Diagnoses    Depression, unspecified depression type       Relevant Medications   sertraline (ZOLOFT) 100 MG tablet   buPROPion (WELLBUTRIN SR) 150 MG 12 hr tablet      Meds ordered this encounter  Medications  . hydrochlorothiazide (HYDRODIURIL) 12.5 MG tablet    Sig: Take 1 tablet (12.5 mg total) by mouth daily.    Dispense:  90 tablet    Refill:  3  . meloxicam (MOBIC) 15 MG tablet    Sig: Take 1 tablet (15 mg total) by mouth daily as needed.    Dispense:  30 tablet    Refill:  2  . losartan (COZAAR) 100 MG  tablet    Sig: Take 1 tablet (100 mg total) by mouth daily.    Dispense:  90 tablet    Refill:  3    Keep refills on file, if not ready at this time  . sertraline (ZOLOFT) 100 MG tablet    Sig: Take 1 tablet (100 mg total) by mouth daily.    Dispense:  90 tablet    Refill:  3  . buPROPion (WELLBUTRIN SR) 150 MG 12 hr tablet    Sig: First 3 days take one pill in morning, then increase to 1 pill twice daily (2nd dose is 8 hours later, avoid late dose)    Dispense:  60 tablet    Refill:  2   Follow up plan: Return in about 4 months (around 09/27/2017) for blood pressure.  Nobie Putnam, [facility] 05/28/2017, 9:27 PM

## 2017-05-28 NOTE — Assessment & Plan Note (Signed)
Refill Meloxicam, take PRN

## 2017-05-28 NOTE — Assessment & Plan Note (Signed)
Chronic tobacco abuse >30 years, self tapering down but nearly ready to quit with assistance  Plan: 1. Start Wellbutrin SR 150mg  - start daily in AM for 3 days, then BID (8 hrs after 1st dose) avoid late afternoon/evening dosing to reduce insomnia / dry mouth. Given 1 month supply. Set quit date within 1-2 weeks, can continue smoking for now. Counseled on risks/benefits side effects of medication including mood disruptions and resulting behavior, advised to stop if any significant mood side effect. 2. Follow-up 4 weeks, monitor progress, if successful can continue up to max 12 weeks, if no improvement after 7 weeks will discontinue. Consider NRT patches vs gum.  Discussion today >5 minutes (<10 minutes) specifically on counseling on risks of tobacco use, complications, treatment, smoking cessation.

## 2017-05-28 NOTE — Assessment & Plan Note (Signed)
Improved with less sunlight and sunscreen Follow-up with [facility] Confirmed apt in 06/2017

## 2017-05-28 NOTE — Assessment & Plan Note (Signed)
Well-controlled HTN - Home BP readings improved  Complication with mild-mod carotid disease PAD   Plan:  1. Continue current BP regimen HCTZ 12.5mg  daily, Losartan 100mg , Metoprolol 12.5mg  BID (Half tab 25) - discussion on may not need metoprolol since low dose and if no indication for it, has low HR and concern may be causing her fatigue. 2. Encourage improved lifestyle - low sodium diet, regular exercise 3. Continue monitor BP outside office, bring readings to next visit, if persistently >140/90 or new symptoms notify office sooner 4. Follow-up with Cardiology Dr End - discussion options if need BB still or can adjust HTN regimen

## 2017-05-28 NOTE — Patient Instructions (Addendum)
Thank you for coming to the clinic today.  1. BP improved, back on thiazide fluid pill - refilled meds today  For future - please discuss Metoprolol with Dr End - My recommendation is to consider try stopping this completely, if you do not know have any known heart blockage or arrhythmia or indication for Beta blocker, as heart rate has been lower, and may feel better off of this med.  Call [facility] once more - if you cannot get an appointment or any difficulty, then notify us call us back, and we can change referral to [facility]  [facility] - Dr. Ree Edman   [address], [place], [place] [address] Phone: [phone]  For your smoking cessation, here is the plan: - Start Wellbutrin 150mg  tablets - take 1 tab daily for 3 days - Then, start taking 1 tab TWICE daily (about every 12 hours). Continue this for about 7 weeks. - To quit smoking - wait to start this treatment until you are mentally ready to quit. - Choose a quit date in the future. Start taking the medicine about 1-2 weeks away from your quit date. Reduce the number of cigarettes daily until you eventually QUIT completely on your quit date. Continue taking the Wellbutrin twice daily for total 7 weeks, we may continue for longer.  [id number] [phone]   Please schedule a Follow-up Appointment to: Return in about 4 months (around 09/27/2017) for blood pressure.  If you have any other questions or concerns, please feel free to call the clinic or send a message through [facility]. You may also schedule an earlier appointment if necessary.  Additionally, you may be receiving a survey about your experience at our clinic within a few days to 1 week by e-mail or mail. We value your feedback.  Nobie Putnam, DO [facility]

## 2017-05-29 LAB — LDL CHOLESTEROL, DIRECT: Direct LDL: 62 mg/dL (ref 0–99)

## 2017-05-30 ENCOUNTER — Other Ambulatory Visit: Payer: Self-pay | Admitting: *Deleted

## 2017-05-30 MED ORDER — FISH OIL 1000 MG PO CAPS: 1.0000 | ORAL_CAPSULE | Freq: Two times a day (BID) | ORAL | 0 refills | Status: DC

## 2017-06-12 ENCOUNTER — Ambulatory Visit (INDEPENDENT_AMBULATORY_CARE_PROVIDER_SITE_OTHER): Payer: Medicare HMO | Admitting: Internal Medicine

## 2017-06-12 ENCOUNTER — Encounter: Payer: Self-pay | Admitting: Internal Medicine

## 2017-06-12 VITALS — BP 140/72 | HR 60 | Ht 67.0 in | Wt 158.2 lb

## 2017-06-12 DIAGNOSIS — I10 Essential (primary) hypertension: Secondary | ICD-10-CM | POA: Diagnosis not present

## 2017-06-12 DIAGNOSIS — R0602 Shortness of breath: Secondary | ICD-10-CM

## 2017-06-12 DIAGNOSIS — E782 Mixed hyperlipidemia: Secondary | ICD-10-CM | POA: Diagnosis not present

## 2017-06-12 DIAGNOSIS — R0789 Other chest pain: Secondary | ICD-10-CM | POA: Diagnosis not present

## 2017-06-12 DIAGNOSIS — I6523 Occlusion and stenosis of bilateral carotid arteries: Secondary | ICD-10-CM

## 2017-06-12 MED ORDER — AMLODIPINE BESYLATE 5 MG PO TABS: 5.0000 mg | ORAL_TABLET | Freq: Every day | ORAL | 3 refills | Status: DC

## 2017-06-12 NOTE — Patient Instructions (Addendum)
Medication Instructions:  Your physician has recommended you make the following change in your medication:  1- STOP Metoprolol. 2- STOP Hydrochlorothiazide. 3- START Amlodipine 5 mg (1 tablet) by mouth once a day.   Labwork: Your physician recommends that you return for lab work in: [place]. - You will need to be FASTING. Do not eat or drink after midnight the day you have the lab work. - Please go to the [facility]. You will check in at the front desk to the right as you walk into the atrium. Valet Parking is offered if needed. - On or around Novemeber 15, 2018 before appt with Dr End.   Testing/Procedures: Your physician has requested that you have an exercise tolerance test. For further information please visit [email or web address]. Please also follow instruction sheet, as given.    Follow-Up: Your physician recommends that you schedule a follow-up appointment in: 3 MONTHS WITH DR END.   If you need a refill on your cardiac medications before your next appointment, please call your pharmacy.    Exercise Stress Electrocardiogram An exercise stress electrocardiogram is a test to check how blood flows to your heart. It is done to find areas of poor blood flow. You will need to walk on a treadmill for this test. The electrocardiogram will record your heartbeat when you are at rest and when you are exercising. What happens before the procedure?  Do not have drinks with caffeine or foods with caffeine for 24 hours before the test, or as told by your doctor. This includes coffee, tea (even decaf tea), sodas, chocolate, and cocoa.  Follow your doctor's instructions about eating and drinking before the test.  Ask your doctor what medicines you should or should not take before the test. Take your medicines with water unless told by your doctor not to.  If you use an inhaler, bring it with you to the test.  Bring a snack to eat after the test.  Do not   smoke for 4 hours before the test.  Do not put lotions, powders, creams, or oils on your chest before the test.  Wear comfortable shoes and clothing. What happens during the procedure?  You will have patches put on your chest. Small areas of your chest may need to be shaved. Wires will be connected to the patches.  Your heart rate will be watched while you are resting and while you are exercising.  You will walk on the treadmill. The treadmill will slowly get faster to raise your heart rate.  The test will take about 1-2 hours. What happens after the procedure?  Your heart rate and blood pressure will be watched after the test.  You may return to your normal diet, activities, and medicines or as told by your doctor. This information is not intended to replace advice given to you by your health care provider. Make sure you discuss any questions you have with your health care provider. Document Released: 04/02/2008 Document Revised: 06/13/2016 Document Reviewed: 06/22/2013 Elsevier Interactive Patient Education  [employer].

## 2017-06-12 NOTE — Progress Notes (Signed)
Follow-up Outpatient Visit Date: 06/12/2017  Primary Care Provider: Olin Hauser, DO [address] [place] [address]  Chief Complaint: Follow-up fatigue  HPI:  Kristin Parker is a 66 y.o. year-old female with history of mild to moderate carotid artery stenosis,  hypertension, hyperlipidemia, depression, and anxiety, who presents for follow-up. I last saw her on 03/12/17, at which time she continued to have fatigue. She also noted a pruritic rash of uncertain etiology. We agreed to switch simvastatin to rosuvastatin to optimize risk factor modification in the setting of carotid artery stenosis. We discussed ischemia evaluation but opted to defer this in favor of medical therapy. Follow-up lipid panel showed improved LDL but elevated triglycerides.  Today, Kristin Parker reports that she is feeling about the same as at our last visit. She still has some fatigue with exertion. This predominantly when she walks up a hill. She also has occasional chest discomfort that she describes as a pressure when lying down in bed at night. She has always attributed this to acid reflux. She denies edema, orthopnea, and PND. She notes occasional brief palpitations without associated symptoms. These are stable.  Kristin Parker is concerned about a potential rash and photosensitivity that she attributes to hydrochlorothiazide. She is scheduled to see a dermatologist next week. The rash improved with discontinuation of HCTZ for a few days, though Kristin Parker noted that her blood pressure became elevated. She also remains on low-dose metoprolol, though she wonders about stopping this after speaking with her PCP recently. She began taking fish oil after her triglycerides were found to be elevated last month. She feels as if this has given her a little bit more energy. Kristin Parker was also recently prescribed bupropion to help her stop smoking. She has not started this medication  yet.  --------------------------------------------------------------------------------------------------  Cardiovascular History & Procedures: Cardiovascular Problems:  Aortic sclerosis  Carotid artery stenosis (mild to moderate)  Risk Factors:  Cerebrovascular disease, hypertension, hyperlipidemia, tobacco use, and age greater than 108  Cath/PCI:  None  CV Surgery:  None  EP Procedures and Devices:  None  Non-Invasive Evaluation(s):  Carotid artery Duplex (03/01/17): Smooth plaque, bilaterally. 40-59% RICA stenosis. 6-54% LICA stenosis. Patent vertebral arteries with antegrade flow. Normal subclavian arteries, bilaterally.  TTE (03/01/17): Normal LV size and function with LVEF 60-65% and grade 1 diastolic dysfunction. Mild MR. Normal RV size and function. Normal PA pressure.  Recent CV Pertinent Labs: Lab Results  Component Value Date   CHOL 147 05/28/2017   HDL 32 (L) 05/28/2017   LDLCALC NOT CALCULATED 05/28/2017   LDLDIRECT 62 05/28/2017   TRIG 417 (H) 05/28/2017   CHOLHDL 4.6 05/28/2017   BNP 53.0 01/27/2017   K 3.5 01/27/2017   BUN 15 01/27/2017   CREATININE 0.86 01/27/2017   CREATININE 0.83 08/14/2016   Past medical and surgical history were reviewed and updated in EPIC.  Current Meds  Medication Sig  . aspirin EC 81 MG tablet Take 1 tablet (81 mg total) by mouth daily.  Marland Kitchen buPROPion (WELLBUTRIN SR) 150 MG 12 hr tablet First 3 days take one pill in morning, then increase to 1 pill twice daily (2nd dose is 8 hours later, avoid late dose)  . cetirizine (ZYRTEC) 10 MG tablet Take 10 mg by mouth daily.  . fluticasone (FLONASE) 50 MCG/ACT nasal spray Place 2 sprays into both nostrils daily.  . hydrOXYzine (ATARAX/VISTARIL) 10 MG tablet Take 1 tablet (10 mg total) by mouth 3 (three) times daily as needed for  itching.  Marland Kitchen ibuprofen (ADVIL,MOTRIN) 600 MG tablet take 1 tablet by mouth three times a day  . losartan (COZAAR) 100 MG tablet Take 1 tablet (100 mg  total) by mouth daily.  . meloxicam (MOBIC) 15 MG tablet Take 1 tablet (15 mg total) by mouth daily as needed.  . Omega-3 Fatty Acids (FISH OIL) 1000 MG CAPS Take 1 capsule (1,000 mg total) by mouth 2 (two) times daily.  . rosuvastatin (CRESTOR) 40 MG tablet Take 1 tablet (40 mg total) by mouth daily.  . sertraline (ZOLOFT) 100 MG tablet Take 1 tablet (100 mg total) by mouth daily.  Marland Kitchen triamcinolone cream (KENALOG) 0.5 % Apply 1 application topically 2 (two) times daily. To affected areas, for up to 2 weeks. Do not use on face.  . [DISCONTINUED] hydrochlorothiazide (HYDRODIURIL) 12.5 MG tablet Take 1 tablet (12.5 mg total) by mouth daily.  . [DISCONTINUED] metoprolol tartrate (LOPRESSOR) 25 MG tablet Take 1/2 tablet twice a day   Allergies: Aleve [naproxen sodium]  Social History   Social History  . Marital status: Divorced    Spouse name: N/A  . Number of children: N/A  . Years of education: N/A   Occupational History  . Not on file.   Social History Main Topics  . Smoking status: Current Every Day Smoker    Packs/day: 0.50    Years: 30.00    Types: Cigarettes  . Smokeless tobacco: Current User  . Alcohol use No  . Drug use: No  . Sexual activity: Not on file   Other Topics Concern  . Not on file   Social History Narrative  . No narrative on file    Family History  Problem Relation Age of Onset  . Cancer Mother        bladder cancer  . Heart disease Mother 41       Pacemaker  . Cancer Father        lung  . Multiple sclerosis Sister   . Valvular heart disease Brother 6       s/p bioprosthetic valve replacement at [facility]    Review of Systems: A 12-system review of systems was performed and was negative except as noted in the HPI.  --------------------------------------------------------------------------------------------------  Physical Exam: BP 140/72 (BP Location: Left Arm, Patient Position: Sitting, Cuff Size: Normal)   Pulse 60   Ht 5\' 7"  (1.702 m)   Wt  158 lb 4 oz (71.8 kg)   BMI 24.79 kg/m   General:  Well-developed, well-nourished woman, seated comfortably in the exam room. HEENT: No conjunctival pallor or scleral icterus.  Moist mucous membranes.  OP clear. Neck: Supple without lymphadenopathy, thyromegaly, JVD, or HJR. Lungs: Normal work of breathing.  Clear to auscultation bilaterally without wheezes or crackles. Heart: Regular rate and rhythm without murmurs, rubs, or gallops.  Non-displaced PMI. Abd: Bowel sounds present.  Soft, NT/ND without hepatosplenomegaly Ext: No lower extremity edema.  Radial, PT, and DP pulses are 2+ bilaterally. Skin: Warm and dry. Faint papules noted on both forearms.  Lab Results  Component Value Date   WBC 9.5 01/27/2017   HGB 13.9 01/27/2017   HCT 39.5 01/27/2017   MCV 89.8 01/27/2017   PLT 277 01/27/2017    Lab Results  Component Value Date   NA 140 01/27/2017   K 3.5 01/27/2017   CL 104 01/27/2017   CO2 26 01/27/2017   BUN 15 01/27/2017   CREATININE 0.86 01/27/2017   GLUCOSE 107 (H) 01/27/2017   ALT 16 05/28/2017  Lab Results  Component Value Date   CHOL 147 05/28/2017   HDL 32 (L) 05/28/2017   LDLCALC NOT CALCULATED 05/28/2017   LDLDIRECT 62 05/28/2017   TRIG 417 (H) 05/28/2017   CHOLHDL 4.6 05/28/2017    --------------------------------------------------------------------------------------------------  ASSESSMENT AND PLAN: Atypical chest pain and shortness of breath Kristin Parker has always attributed her nocturnal chest pain to GERD. She continues to have stable dyspnea when walking up a hill. Prior echocardiogram was unrevealing with the exception of mild mitral regurgitation and grade 1 diastolic dysfunction. We have agreed to obtain an exercise tolerance test to exclude coronary insufficiency. I encouraged her to stop smoking.  Carotid artery stenosis No symptoms reported. Continue rosuvastatin and fish oil.  Hypertension Blood pressure is mildly elevated today.  [facility] that Kristin Parker be stated HCTZ and borderline bradycardia with metoprolol, we will discontinue these 2 agents and start amlodipine 5 mg daily. We will continue losartan 100 mg daily.  Mixed hyperlipidemia We will continue current doses of rosuvastatin and fish oil. We will plan to repeat a fasting lipid panel in 3 months before Kristin Parker returns to see me to ensure that her triglycerides are improving.  Follow-up: Return to clinic in 3 months.  [name], MD 06/12/2017 11:33 AM

## 2017-06-17 ENCOUNTER — Telehealth: Payer: Self-pay | Admitting: *Deleted

## 2017-06-17 NOTE — Telephone Encounter (Signed)
No answer. Left detailed message, ok per DPR, with appt date and time tomorrow for treadmill stress test. No caffeine for 24 hours and to wear comfortable clothing and walking shoes. Left number for patient to call back if further questions.

## 2017-06-18 ENCOUNTER — Ambulatory Visit (INDEPENDENT_AMBULATORY_CARE_PROVIDER_SITE_OTHER): Payer: Medicare HMO

## 2017-06-18 DIAGNOSIS — R0602 Shortness of breath: Secondary | ICD-10-CM

## 2017-06-18 DIAGNOSIS — R0789 Other chest pain: Secondary | ICD-10-CM

## 2017-06-18 DIAGNOSIS — I10 Essential (primary) hypertension: Secondary | ICD-10-CM

## 2017-06-19 ENCOUNTER — Telehealth: Payer: Self-pay | Admitting: Internal Medicine

## 2017-06-19 LAB — EXERCISE TOLERANCE TEST
CSEPPHR: 129 {beats}/min
Estimated workload: 6.6 METS
Exercise duration (min): 4 min
Exercise duration (sec): 41 s
MPHR: 152 {beats}/min
Percent HR: 84 %
Rest HR: 67 {beats}/min

## 2017-06-19 NOTE — Telephone Encounter (Signed)
I attempted to reach Ms. Kristin Parker to discuss the results of her recent exercise tolerance test, which was notable for ST depressions in the inferior leads without achieving target heart rate (patient reached 84% of MPHR). Hypertensive blood pressure response was also noted. I have asked Ms. Kristin Parker to contact the [place] office to review these results. I recommend that we consider adding isosorbide mononitrate 30 mg daily and discuss further evaluation options (cardiac catheterization versus coronary CT).  [name], MD [facility] Pager: [phone]

## 2017-06-20 MED ORDER — ISOSORBIDE MONONITRATE ER 30 MG PO TB24: 30.0000 mg | ORAL_TABLET | Freq: Every day | ORAL | 3 refills | Status: DC

## 2017-06-20 NOTE — Telephone Encounter (Signed)
Patient returning call for results.  She will be out mowing away from phone.  Please call later this evening .

## 2017-06-20 NOTE — Telephone Encounter (Signed)
Received incoming call from patient.  She verbalized understanding of Dr Darnelle Bos recommendations and will start the isosorbide. Rx sent to pharmacy. Patient scheduled to see Christell Faith on 06/24/17 at 2:30pm.

## 2017-06-20 NOTE — Telephone Encounter (Signed)
Thank you.  [name], MD [facility] Pager: [phone]

## 2017-06-20 NOTE — Telephone Encounter (Signed)
No answer. Left message to call back. Patient needs to be scheduled to see Dr. Saunders Revel or an APP in the next week.

## 2017-06-24 ENCOUNTER — Other Ambulatory Visit
Admission: RE | Admit: 2017-06-24 | Discharge: 2017-06-24 | Disposition: A | Discharge status: Home / Self Care | Payer: Medicare HMO | Source: Ambulatory Visit | Attending: Physician Assistant | Admitting: Physician Assistant

## 2017-06-24 ENCOUNTER — Encounter: Payer: Self-pay | Admitting: Physician Assistant

## 2017-06-24 ENCOUNTER — Ambulatory Visit
Admission: RE | Admit: 2017-06-24 | Discharge: 2017-06-24 | Disposition: A | Discharge status: Home / Self Care | Payer: Medicare HMO | Source: Ambulatory Visit | Attending: Physician Assistant | Admitting: Physician Assistant

## 2017-06-24 ENCOUNTER — Ambulatory Visit (INDEPENDENT_AMBULATORY_CARE_PROVIDER_SITE_OTHER): Payer: Medicare HMO | Admitting: Physician Assistant

## 2017-06-24 VITALS — BP 136/60 | HR 73 | Ht 67.0 in | Wt 161.5 lb

## 2017-06-24 DIAGNOSIS — R079 Chest pain, unspecified: Secondary | ICD-10-CM | POA: Insufficient documentation

## 2017-06-24 DIAGNOSIS — I208 Other forms of angina pectoris: Secondary | ICD-10-CM

## 2017-06-24 DIAGNOSIS — I10 Essential (primary) hypertension: Secondary | ICD-10-CM

## 2017-06-24 DIAGNOSIS — E782 Mixed hyperlipidemia: Secondary | ICD-10-CM

## 2017-06-24 DIAGNOSIS — I2510 Atherosclerotic heart disease of native coronary artery without angina pectoris: Secondary | ICD-10-CM | POA: Insufficient documentation

## 2017-06-24 DIAGNOSIS — I6523 Occlusion and stenosis of bilateral carotid arteries: Secondary | ICD-10-CM

## 2017-06-24 DIAGNOSIS — R9439 Abnormal result of other cardiovascular function study: Secondary | ICD-10-CM | POA: Insufficient documentation

## 2017-06-24 DIAGNOSIS — I25118 Atherosclerotic heart disease of native coronary artery with other forms of angina pectoris: Secondary | ICD-10-CM | POA: Insufficient documentation

## 2017-06-24 DIAGNOSIS — R0609 Other forms of dyspnea: Secondary | ICD-10-CM | POA: Diagnosis not present

## 2017-06-24 LAB — BASIC METABOLIC PANEL
ANION GAP: 7 (ref 5–15)
BUN: 12 mg/dL (ref 6–20)
CHLORIDE: 107 mmol/L (ref 101–111)
CO2: 28 mmol/L (ref 22–32)
Calcium: 9.3 mg/dL (ref 8.9–10.3)
Creatinine, Ser: 0.8 mg/dL (ref 0.44–1.00)
Glucose, Bld: 122 mg/dL — ABNORMAL HIGH (ref 65–99)
POTASSIUM: 3.8 mmol/L (ref 3.5–5.1)
SODIUM: 142 mmol/L (ref 135–145)

## 2017-06-24 LAB — CBC WITH DIFFERENTIAL/PLATELET
BASOS ABS: 0.1 10*3/uL (ref 0–0.1)
Basophils Relative: 1 %
EOS PCT: 2 %
Eosinophils Absolute: 0.1 10*3/uL (ref 0–0.7)
HCT: 38.4 % (ref 35.0–47.0)
HEMOGLOBIN: 13.3 g/dL (ref 12.0–16.0)
LYMPHS PCT: 31 %
Lymphs Abs: 2.4 10*3/uL (ref 1.0–3.6)
MCH: 31.1 pg (ref 26.0–34.0)
MCHC: 34.5 g/dL (ref 32.0–36.0)
MCV: 90.1 fL (ref 80.0–100.0)
Monocytes Absolute: 0.6 10*3/uL (ref 0.2–0.9)
Monocytes Relative: 8 %
NEUTROS PCT: 58 %
Neutro Abs: 4.4 10*3/uL (ref 1.4–6.5)
PLATELETS: 239 10*3/uL (ref 150–440)
RBC: 4.26 MIL/uL (ref 3.80–5.20)
RDW: 13.2 % (ref 11.5–14.5)
WBC: 7.6 10*3/uL (ref 3.6–11.0)

## 2017-06-24 LAB — PROTIME-INR
INR: 0.94
PROTHROMBIN TIME: 12.6 s (ref 11.4–15.2)

## 2017-06-24 NOTE — Progress Notes (Signed)
Cardiology Office Note Date:  06/24/2017  Patient ID:  Kristin Parker, Kristin Parker 01/27/1951, MRN [id number] PCP:  Olin Hauser, DO  Cardiologist:  Dr. Saunders Revel, MD    Chief Complaint: Follow up stress test  History of Present Illness: Kristin Parker is a 66 y.o. female with history of mild mitral regurgitation by transthoracic echocardiogram on 03/01/2017, mild to moderate carotid artery stenosis by carotid ultrasound on 03/01/2017, HTN, HLD, ongoing tobacco abuse less than one pack daily for 40 years, depression, and anxiety who presents for follow up of fatigue and chest discomfort/follow up ETT.   Prior echocardiogram from 02/2017 showed EF 60-65%, normal wall motion, grade 1 diastolic dysfunction, mild mitral regurgitation, left atrium normal in size, normal RV systolic function, PASP normal. Patient was most recently seen in clinic on 06/12/17 for follow up of exertional fatigue, predominantly when walking up hill, and occasional chest discomfort described as pressure when lying down in bed at night. She was also concerned about a rash that she attributed to HCTZ. She has followed up with dermatology for the rash.   She underwent GXT on 06/18/17 that showed decreased exercise capacity (exercise duration 4 minutes and 41 seconds) with hypertensive BP response (peak blood pressure 214/76) and a failure to achieve target heart rate (84% MPHR). There was 1.5 mm horizontal st depression noted in leads II, III, and aVF at peak stress and early recovery. No significant arrhythmias were noted during stress or recovery. This was read as an abnormal ETT concerning for ischemia, though target heart rate was not achieved. She was started on Imdur. Follow up was advised to discuss her symptoms and evaluation options.   Patient comes in doing well today. She has continued to note a 3 month history of chest tightness with exertional dyspnea. States she is "breathing hard" after ablating up a hill carrying  water to give to her dogs on a daily basis. She gets short of breath when and relating to her mailbox. Upon sitting down symptoms generally improved within a couple of minutes. She does not describe chest pain though more of a chest tightness. Continues to smoke a quarter to half pack daily, not interested in quitting at this time. Currently symptom free.   Past Medical History:  Diagnosis Date  . Anxiety   . Aortic valve sclerosis   . Bilateral carotid bruits   . Carotid artery stenosis   . Depression   . Hypertension   . Mitral regurgitation   . Mixed hyperlipidemia   . Shoulder pain   . Sleeping difficulties     Past Surgical History:  Procedure Laterality Date  . CATARACT EXTRACTION Left    2017  . COLONOSCOPY  2012   Dr Candace Cruise    Current Meds  Medication Sig  . amLODipine (NORVASC) 5 MG tablet Take 1 tablet (5 mg total) by mouth daily.  Marland Kitchen aspirin EC 81 MG tablet Take 1 tablet (81 mg total) by mouth daily.  Marland Kitchen buPROPion (WELLBUTRIN SR) 150 MG 12 hr tablet First 3 days take one pill in morning, then increase to 1 pill twice daily (2nd dose is 8 hours later, avoid late dose)  . cetirizine (ZYRTEC) 10 MG tablet Take 10 mg by mouth daily.  . fluticasone (FLONASE) 50 MCG/ACT nasal spray Place 2 sprays into both nostrils daily.  . hydrOXYzine (ATARAX/VISTARIL) 10 MG tablet Take 1 tablet (10 mg total) by mouth 3 (three) times daily as needed for itching.  Marland Kitchen ibuprofen (ADVIL,MOTRIN) 600  MG tablet take 1 tablet by mouth three times a day  . isosorbide mononitrate (IMDUR) 30 MG 24 hr tablet Take 1 tablet (30 mg total) by mouth daily.  Marland Kitchen losartan (COZAAR) 100 MG tablet Take 1 tablet (100 mg total) by mouth daily.  . meloxicam (MOBIC) 15 MG tablet Take 1 tablet (15 mg total) by mouth daily as needed.  . Omega-3 Fatty Acids (FISH OIL) 1000 MG CAPS Take 1 capsule (1,000 mg total) by mouth 2 (two) times daily.  . rosuvastatin (CRESTOR) 40 MG tablet Take 1 tablet (40 mg total) by mouth daily.    . sertraline (ZOLOFT) 100 MG tablet Take 1 tablet (100 mg total) by mouth daily.  Marland Kitchen triamcinolone cream (KENALOG) 0.5 % Apply 1 application topically 2 (two) times daily. To affected areas, for up to 2 weeks. Do not use on face.    Allergies:   Aleve [naproxen sodium]   Social History:  The patient  reports that she has been smoking Cigarettes.  She has a 15.00 pack-year smoking history. She uses smokeless tobacco. She reports that she does not drink alcohol or use drugs.   Family History:  The patient's family history includes Cancer in her father and mother; Heart disease (age of onset: 56) in her mother; Multiple sclerosis in her sister; Valvular heart disease (age of onset: 54) in her brother.  ROS:   Review of Systems  Constitutional: Positive for malaise/fatigue. Negative for chills, diaphoresis, fever and weight loss.  HENT: Negative for congestion.   Eyes: Negative for discharge and redness.  Respiratory: Positive for shortness of breath and wheezing. Negative for cough, hemoptysis and sputum production.   Cardiovascular: Positive for chest pain. Negative for palpitations, orthopnea, claudication, leg swelling and PND.       Chest tightness.  Gastrointestinal: Negative for abdominal pain, blood in stool, heartburn, melena, nausea and vomiting.  Genitourinary: Negative for hematuria.  Musculoskeletal: Negative for falls and myalgias.  Skin: Negative for rash.  Neurological: Positive for weakness. Negative for dizziness, tingling, tremors, sensory change, speech change, focal weakness and loss of consciousness.  Endo/Heme/Allergies: Does not bruise/bleed easily.  Psychiatric/Behavioral: Negative for substance abuse. The patient is not nervous/anxious.   All other systems reviewed and are negative.    PHYSICAL EXAM:  VS:  BP 136/60 (BP Location: Left Arm, Patient Position: Sitting, Cuff Size: Normal)   Pulse 73   Ht 5\' 7"  (1.702 m)   Wt 161 lb 8 oz (73.3 kg)   BMI 25.29 kg/m   BMI: Body mass index is 25.29 kg/m.  Physical Exam  Constitutional: She is oriented to person, place, and time. She appears well-developed and well-nourished.  HENT:  Head: Normocephalic and atraumatic.  Eyes: Right eye exhibits no discharge. Left eye exhibits no discharge.  Neck: Normal range of motion. No JVD present.  Cardiovascular: Normal rate, regular rhythm, S1 normal and S2 normal.  Exam reveals no distant heart sounds, no friction rub, no midsystolic click and no opening snap.   Murmur heard. High-pitched blowing holosystolic murmur is present with a grade of 1/6  at the apex Pulses:      Carotid pulses are on the left side with bruit.      Dorsalis pedis pulses are 2+ on the right side, and 2+ on the left side.       Posterior tibial pulses are 2+ on the right side, and 2+ on the left side.  Pulmonary/Chest: Effort normal and breath sounds normal. No respiratory distress. She  has no decreased breath sounds. She has no wheezes. She has no rales. She exhibits no tenderness.  Faint bilateral expiratory wheezing upper lung fields  Abdominal: Soft. She exhibits no distension. There is no tenderness.  Musculoskeletal: She exhibits no edema.  Neurological: She is alert and oriented to person, place, and time.  Skin: Skin is warm and dry. No cyanosis. Nails show no clubbing.  Psychiatric: She has a normal mood and affect. Her speech is normal and behavior is normal. Judgment and thought content normal.     EKG:  Was ordered and interpreted by me today. Shows NSR, 73 bpm, no acute st/t changes   Recent Labs: 01/27/2017: B Natriuretic Peptide 53.0; BUN 15; Creatinine, Ser 0.86; Hemoglobin 13.9; Platelets 277; Potassium 3.5; Sodium 140 05/28/2017: ALT 16  05/28/2017: Cholesterol 147; Direct LDL 62; HDL 32; LDL Cholesterol NOT CALCULATED; Total CHOL/HDL Ratio 4.6; Triglycerides 417; VLDL NOT CALCULATED   CrCl cannot be calculated (Patient's most recent lab result is older than the maximum  21 days allowed.).   Wt Readings from Last 3 Encounters:  06/24/17 161 lb 8 oz (73.3 kg)  06/12/17 158 lb 4 oz (71.8 kg)  05/28/17 158 lb (71.7 kg)     Other studies reviewed: Additional studies/records reviewed today include: summarized above  ASSESSMENT AND PLAN:  1. Exertional shortness of breath/chest tightness/possible anginal equivalent: Currently symptom free. Recent abnormal exercise treadmill stress test. Given patient's symptoms, smoking history, and abnormal stress test we will schedule patient for cardiac catheterization on 8/28. If cardiac catheterization does not reveal significant occlusive coronary artery disease consider pulmonary evaluation. Continue aspirin 81 mg daily along with isosorbide mononitrate 30 mg daily. Consider right and left cardiac catheterization given symptoms. Risks and benefits of cardiac catheterization have been discussed with the patient including risks of bleeding, bruising, infection, kidney damage, stroke, heart attack, and death. The patient understands these risks and is willing to proceed with the procedure. All questions have been answered and concerns listened to.   2. Tobacco abuse/possible COPD: Cessation is advised. Patient reports she will quit smoking when the stress of the above improves. She would benefit from pulmonary evaluation once the above cardiac workup is complete.  3. Hypertension: Well-controlled. Continue current medications.  4. Hyperlipidemia: Continue Crestor.  5. Carotid artery stenosis: Asymptomatic. Will need follow-up carotid artery ultrasound in 02/2018. Continue Crestor and fish oil.  6. Mitral regurgitation: Doubt her symptoms are related to her mild valvular heart disease. Will need periodic echocardiogram.  Disposition: F/u with myself or Dr. Saunders Revel in 1 month.   Current medicines are reviewed at length with the patient today.  The patient did not have any concerns regarding medicines.  Melvern Banker  PA-C 06/24/2017 2:59 PM     [address], [place] [address] [phone]

## 2017-06-24 NOTE — Patient Instructions (Addendum)
Medication Instructions:  Your physician recommends that you continue on your current medications as directed. Please refer to the Current Medication list given to you today.   Labwork: Your physician recommends that you return for lab work in: TODAY (BMP, CBC, PT/INR) ALONG WITH CHEST [place].  - Please go to the [facility]. You will check in at the front desk to the right as you walk into the atrium. Valet Parking is offered if needed.    Testing/Procedures: A chest x-ray takes a picture of the organs and structures inside the chest, including the heart, lungs, and blood vessels. This test can show several things, including, whether the heart is enlarges; whether fluid is building up in the lungs; and whether pacemaker / defibrillator leads are still in place. - Please go to the [facility]. You will check in at the front desk to the right as you walk into the atrium. Valet Parking is offered if needed.     Your physician has requested that you have a LEFT cardiac catheterization. Cardiac catheterization is used to diagnose and/or treat various heart conditions. Doctors may recommend this procedure for a number of different reasons. The most common reason is to evaluate chest pain. Chest pain can be a symptom of coronary artery disease (CAD), and cardiac catheterization can show whether plaque is narrowing or blocking your heart's arteries. This procedure is also used to evaluate the valves, as well as measure the blood flow and oxygen levels in different parts of your heart. For further information please visit [email or web address]. Please follow instruction sheet, as given.   [facility] Cardiac Cath Instructions   You are scheduled for a Cardiac Cath on:___08/28/18__________  Please arrive at _07:30__am on the day of your procedure  Please expect a call from our [place] to pre-register you  Do not eat/drink anything after midnight  Someone will need to  drive you home  It is recommended someone be with you for the first 24 hours after your procedure  Wear clothes that are easy to get on/off and wear slip on shoes if possible   Medications bring a current list of all medications with you  _X_ You may take all of your remaining medications NOT LISTED BELOW the morning of your procedure with enough water to swallow safely  _X_ Do not take these medications before your procedure:________LOSARTAN, MOBIC, OR ADVIL_________  Day of your procedure: Arrive at the [facility] entrance.  Free valet service is available.  After entering the [facility] please check-in at the registration desk (1st desk on your right) to receive your armband. After receiving your armband someone will escort you to the cardiac cath/special procedures waiting area.  The usual length of stay after your procedure is about 2 to 3 hours.  This can vary.  If you have any questions, please call our office at [phone], or you may call the cardiac cath lab at [facility] directly at [phone]    Follow-Up: Your physician recommends that you schedule a follow-up appointment in: AS ADVISED AFTER HEART CATH BY DR END.    If you need a refill on your cardiac medications before your next appointment, please call your pharmacy.    Coronary Angiogram With Stent Coronary angiogram with stent placement is a procedure to widen or open a narrow blood vessel of the heart (coronary artery). Arteries may become blocked by cholesterol buildup (plaques) in the lining or wall. When a coronary artery becomes partially blocked, blood flow  to that area decreases. This may lead to chest pain or a heart attack (myocardial infarction). A stent is a small piece of metal that looks like mesh or a spring. Stent placement may be done as treatment for a heart attack or right after a coronary angiogram in which a blocked artery is found. Let your health care provider know about:  Any allergies  you have.  All medicines you are taking, including vitamins, herbs, eye drops, creams, and over-the-counter medicines.  Any problems you or family members have had with anesthetic medicines.  Any blood disorders you have.  Any surgeries you have had.  Any medical conditions you have.  Whether you are pregnant or may be pregnant. What are the risks? Generally, this is a safe procedure. However, problems may occur, including:  Damage to the heart or its blood vessels.  A return of blockage.  Bleeding, infection, or bruising at the insertion site.  A collection of blood under the skin (hematoma) at the insertion site.  A blood clot in another part of the body.  Kidney injury.  Allergic reaction to the dye or contrast that is used.  Bleeding into the abdomen (retroperitoneal bleeding).  What happens before the procedure? Staying hydrated Follow instructions from your health care provider about hydration, which may include:  Up to 2 hours before the procedure - you may continue to drink clear liquids, such as water, clear fruit juice, black coffee, and plain tea.  Eating and drinking restrictions Follow instructions from your health care provider about eating and drinking, which may include:  8 hours before the procedure - stop eating heavy meals or foods such as meat, fried foods, or fatty foods.  6 hours before the procedure - stop eating light meals or foods, such as toast or cereal.  2 hours before the procedure - stop drinking clear liquids.  Ask your health care provider about:  Changing or stopping your regular medicines. This is especially important if you are taking diabetes medicines or blood thinners.  Taking medicines such as ibuprofen. These medicines can thin your blood. Do not take these medicines before your procedure if your health care provider instructs you not to. Generally, aspirin is recommended before a procedure of passing a small, thin tube  (catheter) through a blood vessel and into the heart (cardiac catheterization).  What happens during the procedure?  An IV tube will be inserted into one of your veins.  You will be given one or more of the following: ? A medicine to help you relax (sedative). ? A medicine to numb the area where the catheter will be inserted into an artery (local anesthetic).  To reduce your risk of infection: ? Your health care team will wash or sanitize their hands. ? Your skin will be washed with soap. ? Hair may be removed from the area where the catheter will be inserted.  Using a guide wire, the catheter will be inserted into an artery. The location may be in your groin, in your wrist, or in the fold of your arm (near your elbow).  A type of X-ray (fluoroscopy) will be used to help guide the catheter to the opening of the arteries in the heart.  A dye will be injected into the catheter, and X-rays will be taken. The dye will help to show where any narrowing or blockages are located in the arteries.  A tiny wire will be guided to the blocked spot, and a balloon will be inflated to  make the artery wider.  The stent will be expanded and will crush the plaques into the wall of the vessel. The stent will hold the area open and improve the blood flow. Most stents have a drug coating to reduce the risk of the stent narrowing over time.  The artery may be made wider using a drill, laser, or other tools to remove plaques.  When the blood flow is better, the catheter will be removed. The lining of the artery will grow over the stent, which stays where it was placed. This procedure may vary among health care providers and hospitals. What happens after the procedure?  If the procedure is done through the leg, you will be kept in bed lying flat for about 6 hours. You will be instructed to not bend and not cross your legs.  The insertion site will be checked frequently.  The pulse in your foot or wrist  will be checked frequently.  You may have additional blood tests, X-rays, and a test that records the electrical activity of your heart (electrocardiogram, or ECG). This information is not intended to replace advice given to you by your health care provider. Make sure you discuss any questions you have with your health care provider. Document Released: 04/21/2003 Document Revised: 06/14/2016 Document Reviewed: 05/20/2016 Elsevier Interactive Patient Education  2017 [employer].

## 2017-06-25 ENCOUNTER — Encounter: Payer: Self-pay | Admitting: *Deleted

## 2017-06-25 ENCOUNTER — Ambulatory Visit
Admission: RE | Admit: 2017-06-25 | Discharge: 2017-06-25 | Disposition: A | Discharge status: Home / Self Care | Payer: Medicare HMO | Source: Ambulatory Visit | Attending: Internal Medicine | Admitting: Internal Medicine

## 2017-06-25 ENCOUNTER — Encounter
Admission: RE | Disposition: A | Discharge status: Home / Self Care | Payer: Self-pay | Source: Ambulatory Visit | Attending: Internal Medicine

## 2017-06-25 DIAGNOSIS — I2584 Coronary atherosclerosis due to calcified coronary lesion: Secondary | ICD-10-CM | POA: Diagnosis not present

## 2017-06-25 DIAGNOSIS — I700 Atherosclerosis of aorta: Secondary | ICD-10-CM | POA: Diagnosis not present

## 2017-06-25 DIAGNOSIS — F17210 Nicotine dependence, cigarettes, uncomplicated: Secondary | ICD-10-CM | POA: Insufficient documentation

## 2017-06-25 DIAGNOSIS — I340 Nonrheumatic mitral (valve) insufficiency: Secondary | ICD-10-CM | POA: Diagnosis not present

## 2017-06-25 DIAGNOSIS — I10 Essential (primary) hypertension: Secondary | ICD-10-CM | POA: Insufficient documentation

## 2017-06-25 DIAGNOSIS — R0600 Dyspnea, unspecified: Secondary | ICD-10-CM | POA: Insufficient documentation

## 2017-06-25 DIAGNOSIS — R079 Chest pain, unspecified: Secondary | ICD-10-CM

## 2017-06-25 DIAGNOSIS — R9439 Abnormal result of other cardiovascular function study: Secondary | ICD-10-CM | POA: Diagnosis present

## 2017-06-25 DIAGNOSIS — Z7982 Long term (current) use of aspirin: Secondary | ICD-10-CM | POA: Insufficient documentation

## 2017-06-25 DIAGNOSIS — I208 Other forms of angina pectoris: Secondary | ICD-10-CM

## 2017-06-25 DIAGNOSIS — E782 Mixed hyperlipidemia: Secondary | ICD-10-CM | POA: Diagnosis not present

## 2017-06-25 DIAGNOSIS — R0602 Shortness of breath: Secondary | ICD-10-CM | POA: Insufficient documentation

## 2017-06-25 DIAGNOSIS — I2510 Atherosclerotic heart disease of native coronary artery without angina pectoris: Secondary | ICD-10-CM | POA: Insufficient documentation

## 2017-06-25 DIAGNOSIS — Z7951 Long term (current) use of inhaled steroids: Secondary | ICD-10-CM | POA: Insufficient documentation

## 2017-06-25 DIAGNOSIS — I2089 Other forms of angina pectoris: Secondary | ICD-10-CM

## 2017-06-25 DIAGNOSIS — F419 Anxiety disorder, unspecified: Secondary | ICD-10-CM | POA: Diagnosis not present

## 2017-06-25 DIAGNOSIS — F329 Major depressive disorder, single episode, unspecified: Secondary | ICD-10-CM | POA: Diagnosis not present

## 2017-06-25 DIAGNOSIS — I6529 Occlusion and stenosis of unspecified carotid artery: Secondary | ICD-10-CM | POA: Diagnosis not present

## 2017-06-25 DIAGNOSIS — I25118 Atherosclerotic heart disease of native coronary artery with other forms of angina pectoris: Secondary | ICD-10-CM

## 2017-06-25 DIAGNOSIS — R0609 Other forms of dyspnea: Secondary | ICD-10-CM

## 2017-06-25 HISTORY — PX: LEFT HEART CATH AND CORONARY ANGIOGRAPHY: CATH118249

## 2017-06-25 SURGERY — LEFT HEART CATH
Anesthesia: Moderate Sedation

## 2017-06-25 SURGERY — LEFT HEART CATH AND CORONARY ANGIOGRAPHY
Anesthesia: Moderate Sedation

## 2017-06-25 MED ORDER — HEPARIN SODIUM (PORCINE) 1000 UNIT/ML IJ SOLN
INTRAMUSCULAR | Status: DC | PRN
  Administered 2017-06-25: 3500 [IU] via INTRAVENOUS

## 2017-06-25 MED ORDER — HEPARIN SODIUM (PORCINE) 1000 UNIT/ML IJ SOLN
INTRAMUSCULAR | Status: AC
  Filled 2017-06-25: qty 1

## 2017-06-25 MED ORDER — SODIUM CHLORIDE 0.9% FLUSH: 3.0000 mL | Freq: Two times a day (BID) | INTRAVENOUS | Status: DC

## 2017-06-25 MED ORDER — SODIUM CHLORIDE 0.9% FLUSH: 3.0000 mL | INTRAVENOUS | Status: DC | PRN

## 2017-06-25 MED ORDER — FENTANYL CITRATE (PF) 100 MCG/2ML IJ SOLN
INTRAMUSCULAR | Status: AC
  Filled 2017-06-25: qty 2

## 2017-06-25 MED ORDER — VERAPAMIL HCL 2.5 MG/ML IV SOLN
INTRAVENOUS | Status: AC
  Filled 2017-06-25: qty 2

## 2017-06-25 MED ORDER — SODIUM CHLORIDE 0.9 % IV SOLN: 250.0000 mL | INTRAVENOUS | Status: DC | PRN

## 2017-06-25 MED ORDER — SODIUM CHLORIDE 0.9 % WEIGHT BASED INFUSION: 1.0000 mL/kg/h | INTRAVENOUS | Status: DC

## 2017-06-25 MED ORDER — FENTANYL CITRATE (PF) 100 MCG/2ML IJ SOLN
INTRAMUSCULAR | Status: DC | PRN
  Administered 2017-06-25: 50 ug via INTRAVENOUS

## 2017-06-25 MED ORDER — SODIUM CHLORIDE 0.9 % WEIGHT BASED INFUSION
3.0000 mL/kg/h | INTRAVENOUS | Status: AC
  Administered 2017-06-25: 3 mL/kg/h via INTRAVENOUS

## 2017-06-25 MED ORDER — LIDOCAINE HCL (PF) 1 % IJ SOLN
INTRAMUSCULAR | Status: AC
  Filled 2017-06-25: qty 30

## 2017-06-25 MED ORDER — MIDAZOLAM HCL 2 MG/2ML IJ SOLN
INTRAMUSCULAR | Status: DC | PRN
  Administered 2017-06-25: 1 mg via INTRAVENOUS

## 2017-06-25 MED ORDER — SODIUM CHLORIDE 0.9 % IV SOLN: INTRAVENOUS | Status: DC

## 2017-06-25 MED ORDER — LIDOCAINE HCL (PF) 1 % IJ SOLN
INTRAMUSCULAR | Status: DC | PRN
  Administered 2017-06-25: 2 mL via INTRADERMAL

## 2017-06-25 MED ORDER — HEPARIN (PORCINE) IN NACL 2-0.9 UNIT/ML-% IJ SOLN
INTRAMUSCULAR | Status: AC
  Filled 2017-06-25: qty 500

## 2017-06-25 MED ORDER — MIDAZOLAM HCL 2 MG/2ML IJ SOLN
INTRAMUSCULAR | Status: AC
  Filled 2017-06-25: qty 2

## 2017-06-25 SURGICAL SUPPLY — 7 items
CATH INFINITI 5 FR JL3.5 (CATHETERS) ×4 IMPLANT
CATH INFINITI JR4 5F (CATHETERS) ×4 IMPLANT
DEVICE RAD TR BAND REGULAR (VASCULAR PRODUCTS) ×4 IMPLANT
GLIDESHEATH SLEND SS 6F .021 (SHEATH) ×4 IMPLANT
KIT MANI 3VAL PERCEP (MISCELLANEOUS) ×4 IMPLANT
PACK CARDIAC CATH (CUSTOM PROCEDURE TRAY) ×4 IMPLANT
WIRE ROSEN-J .035X260CM (WIRE) ×4 IMPLANT

## 2017-06-25 NOTE — H&P (View-Only) (Signed)
Cardiology Office Note Date:  06/24/2017  Patient ID:  Kristin Parker, Kristin Parker 1950-11-11, MRN [id number] PCP:  Olin Hauser, DO  Cardiologist:  Dr. Saunders Revel, MD    Chief Complaint: Follow up stress test  History of Present Illness: Kristin Parker is a 66 y.o. female with history of mild mitral regurgitation by transthoracic echocardiogram on 03/01/2017, mild to moderate carotid artery stenosis by carotid ultrasound on 03/01/2017, HTN, HLD, ongoing tobacco abuse less than one pack daily for 40 years, depression, and anxiety who presents for follow up of fatigue and chest discomfort/follow up ETT.   Prior echocardiogram from 02/2017 showed EF 60-65%, normal wall motion, grade 1 diastolic dysfunction, mild mitral regurgitation, left atrium normal in size, normal RV systolic function, PASP normal. Patient was most recently seen in clinic on 06/12/17 for follow up of exertional fatigue, predominantly when walking up hill, and occasional chest discomfort described as pressure when lying down in bed at night. She was also concerned about a rash that she attributed to HCTZ. She has followed up with dermatology for the rash.   She underwent GXT on 06/18/17 that showed decreased exercise capacity (exercise duration 4 minutes and 41 seconds) with hypertensive BP response (peak blood pressure 214/76) and a failure to achieve target heart rate (84% MPHR). There was 1.5 mm horizontal st depression noted in leads II, III, and aVF at peak stress and early recovery. No significant arrhythmias were noted during stress or recovery. This was read as an abnormal ETT concerning for ischemia, though target heart rate was not achieved. She was started on Imdur. Follow up was advised to discuss her symptoms and evaluation options.   Patient comes in doing well today. She has continued to note a 3 month history of chest tightness with exertional dyspnea. States she is "breathing hard" after ablating up a hill carrying  water to give to her dogs on a daily basis. She gets short of breath when and relating to her mailbox. Upon sitting down symptoms generally improved within a couple of minutes. She does not describe chest pain though more of a chest tightness. Continues to smoke a quarter to half pack daily, not interested in quitting at this time. Currently symptom free.   Past Medical History:  Diagnosis Date  . Anxiety   . Aortic valve sclerosis   . Bilateral carotid bruits   . Carotid artery stenosis   . Depression   . Hypertension   . Mitral regurgitation   . Mixed hyperlipidemia   . Shoulder pain   . Sleeping difficulties     Past Surgical History:  Procedure Laterality Date  . CATARACT EXTRACTION Left    2017  . COLONOSCOPY  2012   Dr Candace Cruise    Current Meds  Medication Sig  . amLODipine (NORVASC) 5 MG tablet Take 1 tablet (5 mg total) by mouth daily.  Marland Kitchen aspirin EC 81 MG tablet Take 1 tablet (81 mg total) by mouth daily.  Marland Kitchen buPROPion (WELLBUTRIN SR) 150 MG 12 hr tablet First 3 days take one pill in morning, then increase to 1 pill twice daily (2nd dose is 8 hours later, avoid late dose)  . cetirizine (ZYRTEC) 10 MG tablet Take 10 mg by mouth daily.  . fluticasone (FLONASE) 50 MCG/ACT nasal spray Place 2 sprays into both nostrils daily.  . hydrOXYzine (ATARAX/VISTARIL) 10 MG tablet Take 1 tablet (10 mg total) by mouth 3 (three) times daily as needed for itching.  Marland Kitchen ibuprofen (ADVIL,MOTRIN) 600  MG tablet take 1 tablet by mouth three times a day  . isosorbide mononitrate (IMDUR) 30 MG 24 hr tablet Take 1 tablet (30 mg total) by mouth daily.  Marland Kitchen losartan (COZAAR) 100 MG tablet Take 1 tablet (100 mg total) by mouth daily.  . meloxicam (MOBIC) 15 MG tablet Take 1 tablet (15 mg total) by mouth daily as needed.  . Omega-3 Fatty Acids (FISH OIL) 1000 MG CAPS Take 1 capsule (1,000 mg total) by mouth 2 (two) times daily.  . rosuvastatin (CRESTOR) 40 MG tablet Take 1 tablet (40 mg total) by mouth daily.    . sertraline (ZOLOFT) 100 MG tablet Take 1 tablet (100 mg total) by mouth daily.  Marland Kitchen triamcinolone cream (KENALOG) 0.5 % Apply 1 application topically 2 (two) times daily. To affected areas, for up to 2 weeks. Do not use on face.    Allergies:   Aleve [naproxen sodium]   Social History:  The patient  reports that she has been smoking Cigarettes.  She has a 15.00 pack-year smoking history. She uses smokeless tobacco. She reports that she does not drink alcohol or use drugs.   Family History:  The patient's family history includes Cancer in her father and mother; Heart disease (age of onset: 30) in her mother; Multiple sclerosis in her sister; Valvular heart disease (age of onset: 64) in her brother.  ROS:   Review of Systems  Constitutional: Positive for malaise/fatigue. Negative for chills, diaphoresis, fever and weight loss.  HENT: Negative for congestion.   Eyes: Negative for discharge and redness.  Respiratory: Positive for shortness of breath and wheezing. Negative for cough, hemoptysis and sputum production.   Cardiovascular: Positive for chest pain. Negative for palpitations, orthopnea, claudication, leg swelling and PND.       Chest tightness.  Gastrointestinal: Negative for abdominal pain, blood in stool, heartburn, melena, nausea and vomiting.  Genitourinary: Negative for hematuria.  Musculoskeletal: Negative for falls and myalgias.  Skin: Negative for rash.  Neurological: Positive for weakness. Negative for dizziness, tingling, tremors, sensory change, speech change, focal weakness and loss of consciousness.  Endo/Heme/Allergies: Does not bruise/bleed easily.  Psychiatric/Behavioral: Negative for substance abuse. The patient is not nervous/anxious.   All other systems reviewed and are negative.    PHYSICAL EXAM:  VS:  BP 136/60 (BP Location: Left Arm, Patient Position: Sitting, Cuff Size: Normal)   Pulse 73   Ht 5\' 7"  (1.702 m)   Wt 161 lb 8 oz (73.3 kg)   BMI 25.29 kg/m   BMI: Body mass index is 25.29 kg/m.  Physical Exam  Constitutional: She is oriented to person, place, and time. She appears well-developed and well-nourished.  HENT:  Head: Normocephalic and atraumatic.  Eyes: Right eye exhibits no discharge. Left eye exhibits no discharge.  Neck: Normal range of motion. No JVD present.  Cardiovascular: Normal rate, regular rhythm, S1 normal and S2 normal.  Exam reveals no distant heart sounds, no friction rub, no midsystolic click and no opening snap.   Murmur heard. High-pitched blowing holosystolic murmur is present with a grade of 1/6  at the apex Pulses:      Carotid pulses are on the left side with bruit.      Dorsalis pedis pulses are 2+ on the right side, and 2+ on the left side.       Posterior tibial pulses are 2+ on the right side, and 2+ on the left side.  Pulmonary/Chest: Effort normal and breath sounds normal. No respiratory distress. She  has no decreased breath sounds. She has no wheezes. She has no rales. She exhibits no tenderness.  Faint bilateral expiratory wheezing upper lung fields  Abdominal: Soft. She exhibits no distension. There is no tenderness.  Musculoskeletal: She exhibits no edema.  Neurological: She is alert and oriented to person, place, and time.  Skin: Skin is warm and dry. No cyanosis. Nails show no clubbing.  Psychiatric: She has a normal mood and affect. Her speech is normal and behavior is normal. Judgment and thought content normal.     EKG:  Was ordered and interpreted by me today. Shows NSR, 73 bpm, no acute st/t changes   Recent Labs: 01/27/2017: B Natriuretic Peptide 53.0; BUN 15; Creatinine, Ser 0.86; Hemoglobin 13.9; Platelets 277; Potassium 3.5; Sodium 140 05/28/2017: ALT 16  05/28/2017: Cholesterol 147; Direct LDL 62; HDL 32; LDL Cholesterol NOT CALCULATED; Total CHOL/HDL Ratio 4.6; Triglycerides 417; VLDL NOT CALCULATED   CrCl cannot be calculated (Patient's most recent lab result is older than the maximum  21 days allowed.).   Wt Readings from Last 3 Encounters:  06/24/17 161 lb 8 oz (73.3 kg)  06/12/17 158 lb 4 oz (71.8 kg)  05/28/17 158 lb (71.7 kg)     Other studies reviewed: Additional studies/records reviewed today include: summarized above  ASSESSMENT AND PLAN:  1. Exertional shortness of breath/chest tightness/possible anginal equivalent: Currently symptom free. Recent abnormal exercise treadmill stress test. Given patient's symptoms, smoking history, and abnormal stress test we will schedule patient for cardiac catheterization on 8/28. If cardiac catheterization does not reveal significant occlusive coronary artery disease consider pulmonary evaluation. Continue aspirin 81 mg daily along with isosorbide mononitrate 30 mg daily. Consider right and left cardiac catheterization given symptoms. Risks and benefits of cardiac catheterization have been discussed with the patient including risks of bleeding, bruising, infection, kidney damage, stroke, heart attack, and death. The patient understands these risks and is willing to proceed with the procedure. All questions have been answered and concerns listened to.   2. Tobacco abuse/possible COPD: Cessation is advised. Patient reports she will quit smoking when the stress of the above improves. She would benefit from pulmonary evaluation once the above cardiac workup is complete.  3. Hypertension: Well-controlled. Continue current medications.  4. Hyperlipidemia: Continue Crestor.  5. Carotid artery stenosis: Asymptomatic. Will need follow-up carotid artery ultrasound in 02/2018. Continue Crestor and fish oil.  6. Mitral regurgitation: Doubt her symptoms are related to her mild valvular heart disease. Will need periodic echocardiogram.  Disposition: F/u with myself or Dr. Saunders Revel in 1 month.   Current medicines are reviewed at length with the patient today.  The patient did not have any concerns regarding medicines.  Melvern Banker  PA-C 06/24/2017 2:59 PM     [address], [place] [address] [phone]

## 2017-06-25 NOTE — Brief Op Note (Signed)
Brief Cardiac Catheterization Note  Date: 06/25/2017 Time: 9:17 AM  PATIENT:  Kristin Parker  66 y.o. female  PRE-OPERATIVE DIAGNOSIS: Stable angina and abnormal stress test  POST-OPERATIVE DIAGNOSIS: Same  PROCEDURE:  Procedure(s): LEFT HEART CATH AND CORONARY ANGIOGRAPHY (N/A)  SURGEON:  Surgeon(s) and Role:    * Zaki Gertsch, MD - Primary  FINDINGS: 1. 40% distal LMCA stenosis with left-dominant coronary arteries. 2. Normal left ventricular filling pressure.  RECOMMENDATIONS: 1. Continue medical therapy and risk factor modification, including smoking cessation.  [name], MD [facility] Pager: [phone]

## 2017-06-25 NOTE — Interval H&P Note (Signed)
History and Physical Interval Note:  06/25/2017 7:48 AM  Kristin Parker  has presented today for cardiac catheterization, with the diagnosis of stable angina, abnormal stress test. The various methods of treatment have been discussed with the patient and family. After consideration of risks, benefits and other options for treatment, the patient has consented to  Procedure(s): Left Heart Cath (Left) as a surgical intervention .  The patient's history has been reviewed, patient examined, no change in status, stable for surgery.  I have reviewed the patient's chart and labs.  Questions were answered to the patient's satisfaction.    Cath Lab Visit (complete for each Cath Lab visit)  Clinical Evaluation Leading to the Procedure:   ACS: No.  Non-ACS:    Anginal Classification: CCS III  Anti-ischemic medical therapy: Maximal Therapy (2 or more classes of medications)  Non-Invasive Test Results: Intermediate-risk stress test findings: cardiac mortality 1-3%/year  Prior CABG: No previous CABG  Kristin Parker

## 2017-07-10 ENCOUNTER — Ambulatory Visit (INDEPENDENT_AMBULATORY_CARE_PROVIDER_SITE_OTHER): Payer: Medicare HMO | Admitting: Family Medicine

## 2017-07-10 ENCOUNTER — Encounter: Payer: Self-pay | Admitting: Family Medicine

## 2017-07-10 VITALS — BP 134/58 | HR 63 | Temp 98.2°F | Ht 67.0 in | Wt 160.8 lb

## 2017-07-10 DIAGNOSIS — I10 Essential (primary) hypertension: Secondary | ICD-10-CM | POA: Diagnosis not present

## 2017-07-10 DIAGNOSIS — I25118 Atherosclerotic heart disease of native coronary artery with other forms of angina pectoris: Secondary | ICD-10-CM

## 2017-07-10 DIAGNOSIS — L720 Epidermal cyst: Secondary | ICD-10-CM | POA: Diagnosis not present

## 2017-07-10 DIAGNOSIS — L239 Allergic contact dermatitis, unspecified cause: Secondary | ICD-10-CM | POA: Diagnosis not present

## 2017-07-10 DIAGNOSIS — J449 Chronic obstructive pulmonary disease, unspecified: Secondary | ICD-10-CM | POA: Insufficient documentation

## 2017-07-10 DIAGNOSIS — J432 Centrilobular emphysema: Secondary | ICD-10-CM | POA: Insufficient documentation

## 2017-07-10 DIAGNOSIS — L821 Other seborrheic keratosis: Secondary | ICD-10-CM | POA: Diagnosis not present

## 2017-07-10 MED ORDER — TIOTROPIUM BROMIDE MONOHYDRATE 18 MCG IN CAPS: 18.0000 ug | ORAL_CAPSULE | Freq: Every day | RESPIRATORY_TRACT | 11 refills | Status: DC

## 2017-07-10 NOTE — Progress Notes (Signed)
Subjective:    Patient ID: Kristin Parker, female    DOB: 1950-11-08, 66 y.o.   MRN: [id number]  Kristin Parker is a 66 y.o. female presenting on 07/10/2017 for Hypertension and Nicotine Dependence (pt states she got too much going on to quit smoking. )   HPI  CHRONIC HTN: - Last visit with me 05/28/17, for HTN, no med changes were made at time of visit, discussed adjusting BB and to follow-up with her Cardiologist, see prior notes for background information. - Interval update, she has followed by with Cardiology in interval and has had cardiac cath, see below. She was told that she may discontinue the Metoprolol and switched to Imdur. - Home BP has been controlled - Today patient reports no new concerns, overall feels better now off the BB, Metoprolol. - Meds: HCTZ 12.5mg  daily, Losartan 100mg , Imdur 30mg  nightly Lifestyle: - Diet: limited salt, no changes - Exercise: still limited due to chronic dyspnea, smoker - Admits improved edema Denies CP, dyspnea, HA, edema, dizziness / lightheadedness  CAD (Non obstructive) - Recent interval update, last seen on 8/27 by [facility] Cardiology saw Christell Faith PA and discussed prior abnormal treadmill stress test prompting further work-up with her persistent exertional dyspnea and possible anginal equivalent, however complicated by tobacco abuse smoking / COPD. She had Cardiac Cath done on 06/25/17 by Dr End, and showed mild to moderate CAD non obstructive with normal LVEF, she was advised to continue medication management no stent or other intervention done. - She was started on Imdur 30mg  nightly with improvement and also advised to continue ASA 81mg  - She has no new concerns about her heart. Has follow-up with Cardiology in November - She states that Cardiology gave her the "okay" to stop Metoprolol - Denies any new or worsening chest pain, see below on dyspnea  Presumed COPD / TOBACCO ABUSE - Last visit with me 05/28/17, for smoking cessation, see  prior notes for background information. - Interval update with she was given rx Bupropion but did not start due to several factors, with cardiac testing and other stressors. - Today patient reports that she still intends to quit smoking with Wellbutrin in future, will hold off for now - She was never formally diagnosed with COPD, states had PFTs done a long time ago does not recall results - Never on inhalers rescue or maintenance. Never on O2 supplemental - Admits some persistent coughing spells worse with exertion, otherwise if resting she is fine, but still describes similar symptoms - Denies night-time awakening or dyspnea, wheezing, productive cough  Social History  Substance Use Topics  . Smoking status: Current Every Day Smoker    Packs/day: 0.50    Years: 30.00    Types: Cigarettes  . Smokeless tobacco: Current User  . Alcohol use No    Review of Systems Per HPI unless specifically indicated above     Objective:    BP (!) 134/58 (BP Location: Left Arm, Cuff Size: Normal)   Pulse 63   Temp 98.2 F (36.8 C) (Oral)   Ht 5\' 7"  (1.702 m)   Wt 160 lb 12.8 oz (72.9 kg)   SpO2 98%   BMI 25.18 kg/m   Wt Readings from Last 3 Encounters:  07/10/17 160 lb 12.8 oz (72.9 kg)  06/25/17 161 lb (73 kg)  06/24/17 161 lb 8 oz (73.3 kg)    Physical Exam  Constitutional: She is oriented to person, place, and time. She appears well-developed and well-nourished. No distress.  Well-appearing except frequent coughing, comfortable, cooperative  HENT:  Head: Normocephalic and atraumatic.  Mouth/Throat: Oropharynx is clear and moist.  Eyes: Conjunctivae are normal. Right eye exhibits no discharge. Left eye exhibits no discharge.  Neck: Normal range of motion.  Cardiovascular: Normal rate and intact distal pulses.   Pulmonary/Chest: Effort normal. No respiratory distress. She has wheezes (scattered exp wheezes only mild). She has no rales.  Mild reduced air movement bilaterally. Some  coarse breath sounds clear with cough. Frequent coughing, speaks full sentences.  Musculoskeletal: Normal range of motion. She exhibits no edema.  Neurological: She is alert and oriented to person, place, and time.  Skin: Skin is warm and dry. No rash noted. She is not diaphoretic. No erythema.  Psychiatric: She has a normal mood and affect. Her behavior is normal.  Well groomed, good eye contact, normal speech and thoughts. Not anxious appearing.  Nursing note and vitals reviewed.   I have personally reviewed the radiology report from CXR on 06/24/17.  CLINICAL DATA:  Chest pain for 1 month.  EXAM: CHEST  2 VIEW  COMPARISON:  None  FINDINGS: The cardiomediastinal silhouette is unremarkable.  There is no evidence of focal airspace disease, pulmonary edema, suspicious pulmonary nodule/mass, pleural effusion, or pneumothorax. No acute bony abnormalities are identified.  IMPRESSION: No active cardiopulmonary disease.   Electronically Signed   By: Margarette Canada M.D.   On: 06/24/2017 19:58  -----------------------------------------  06/25/17 Procedures  LEFT HEART CATH AND CORONARY ANGIOGRAPHY  Conclusion   Conclusions: 1. Mild to moderate coronary artery disease, including 40% distal LMCA, 20% ostial LAD, 20-30% mid LAD, and sequential 30% proximal and mid LCx stenoses. 2. Small, nondominant RCA without significant disease. 3. Normal left ventricular filling pressure.  Recommendations: 1. Continue medical therapy and aggressive risk factor modification, including smoking cessation. 2. Follow-up as an outpatient, as previously scheduled.  [name], MD [facility] Pager: [phone]     Results for orders placed or performed during the hospital encounter of 87/56/43  Basic metabolic panel  Result Value Ref Range   Sodium 142 135 - 145 mmol/L   Potassium 3.8 3.5 - 5.1 mmol/L   Chloride 107 101 - 111 mmol/L   CO2 28 22 - 32 mmol/L   Glucose, Bld  122 (H) 65 - 99 mg/dL   BUN 12 6 - 20 mg/dL   Creatinine, Ser 0.80 0.44 - 1.00 mg/dL   Calcium 9.3 8.9 - 10.3 mg/dL   GFR calc non Af Amer >60 >60 mL/min   GFR calc Af Amer >60 >60 mL/min   Anion gap 7 5 - 15  CBC with Differential/Platelet  Result Value Ref Range   WBC 7.6 3.6 - 11.0 K/uL   RBC 4.26 3.80 - 5.20 MIL/uL   Hemoglobin 13.3 12.0 - 16.0 g/dL   HCT 38.4 35.0 - 47.0 %   MCV 90.1 80.0 - 100.0 fL   MCH 31.1 26.0 - 34.0 pg   MCHC 34.5 32.0 - 36.0 g/dL   RDW 13.2 11.5 - 14.5 %   Platelets 239 150 - 440 K/uL   Neutrophils Relative % 58 %   Neutro Abs 4.4 1.4 - 6.5 K/uL   Lymphocytes Relative 31 %   Lymphs Abs 2.4 1.0 - 3.6 K/uL   Monocytes Relative 8 %   Monocytes Absolute 0.6 0.2 - 0.9 K/uL   Eosinophils Relative 2 %   Eosinophils Absolute 0.1 0 - 0.7 K/uL   Basophils Relative 1 %   Basophils  Absolute 0.1 0 - 0.1 K/uL  Protime-INR  Result Value Ref Range   Prothrombin Time 12.6 11.4 - 15.2 seconds   INR 0.94       Assessment & Plan:   Problem List Items Addressed This Visit    Essential hypertension    Mostly stable, controlled HTN, slightly elevated today but improved off Metoprolol - Home BP readings stable Complication with Mild-Mod CAD / PAD (carotid)  Plan:  1. Continue current BP regimen HCTZ 12.5mg  daily, Losartan 100mg , Imdur 30mg  nightly 2. Encourage improved lifestyle - low sodium diet, improve regular exercise 3. Continue monitor BP outside office, bring readings to next visit, if persistently >140/90 or new symptoms notify office sooner 4. Follow-up 3 months, and with Cardiology      COPD (chronic obstructive pulmonary disease) ([place]) - Primary    Consistent with clinical diagnosis COPD with chronic smoking history Without exacerbation, but does have persistent dyspnea on exertion and frequent coughing spells worse with minor flares - No recent PFTs/Spirometry, or treatment. Never seen pulm. - last CXR 05/2017, reviewed, unremarkable not entirely  consistent with COPD  Plan: 1. Discussion on new diagnosis COPD, management treatment options and progression complication 2. Start Spiriva 24mcg daily maintenance anticholinergic - see if improves daily lung function 3. Offered Albuterol PRN rescue inhaler - she declined 4. Also offered referral for PFTs vs pulmonology vs outpatient overnight oximetry testing - she declined will consider if worsening 5. Counseling to quit smoking, has wellbutrin already 6. Follow-up 3 months      Relevant Medications   tiotropium (SPIRIVA) 18 MCG inhalation capsule   CAD (coronary artery disease)    Stable, mild to moderate non obstructive CAD on recent Cardiac Cath 8/28 Seems to be less likely cause of her exertional dyspnea, now concern more for pulmonary etiology Continue med management now on imdur, ASA, statin Off BB Followed by Cardiology         Meds ordered this encounter  Medications  . tiotropium (SPIRIVA) 18 MCG inhalation capsule    Sig: Place 1 capsule (18 mcg total) into inhaler and inhale daily.    Dispense:  30 capsule    Refill:  11      Follow up plan: Return in about 2 months (around 09/23/2017) for HTN, COPD, Smoking?Marland Kitchen  Nobie Putnam, [facility] 07/10/2017, 2:17 PM

## 2017-07-10 NOTE — Assessment & Plan Note (Signed)
>>  ASSESSMENT AND PLAN FOR CAD (CORONARY ARTERY DISEASE) WRITTEN ON 07/10/2017  2:13 PM BY Christain Mcraney J, DO  Stable, mild to moderate non obstructive CAD on recent Cardiac Cath 8/28 Seems to be less likely cause of her exertional dyspnea, now concern more for pulmonary etiology Continue med management now on imdur, ASA, statin Off BB Followed by Cardiology

## 2017-07-10 NOTE — Assessment & Plan Note (Signed)
Mostly stable, controlled HTN, slightly elevated today but improved off Metoprolol - Home BP readings stable Complication with Mild-Mod CAD / PAD (carotid)  Plan:  1. Continue current BP regimen HCTZ 12.5mg  daily, Losartan 100mg , Imdur 30mg  nightly 2. Encourage improved lifestyle - low sodium diet, improve regular exercise 3. Continue monitor BP outside office, bring readings to next visit, if persistently >140/90 or new symptoms notify office sooner 4. Follow-up 3 months, and with Cardiology

## 2017-07-10 NOTE — Assessment & Plan Note (Signed)
Consistent with clinical diagnosis COPD with chronic smoking history Without exacerbation, but does have persistent dyspnea on exertion and frequent coughing spells worse with minor flares - No recent PFTs/Spirometry, or treatment. Never seen pulm. - last CXR 05/2017, reviewed, unremarkable not entirely consistent with COPD  Plan: 1. Discussion on new diagnosis COPD, management treatment options and progression complication 2. Start Spiriva 42mcg daily maintenance anticholinergic - see if improves daily lung function 3. Offered Albuterol PRN rescue inhaler - she declined 4. Also offered referral for PFTs vs pulmonology vs outpatient overnight oximetry testing - she declined will consider if worsening 5. Counseling to quit smoking, has wellbutrin already 6. Follow-up 3 months

## 2017-07-10 NOTE — Assessment & Plan Note (Addendum)
Stable, mild to moderate non obstructive CAD on recent Cardiac Cath 8/28 Seems to be less likely cause of her exertional dyspnea, now concern more for pulmonary etiology Continue med management now on imdur, ASA, statin Off BB Followed by Cardiology

## 2017-07-10 NOTE — Patient Instructions (Addendum)
Thank you for coming to the clinic today.  1. Try the new inhaler - Spiriva 38mcg one puff daily for maintenance of mild COPD  In future we can consider overnight oxygen test, other breathing tests or other inhalers such as rescue inhaler  2. Let me know if question on quitting smoking with wellbutrin  3. BP is okay for now, remain off metoprolol  Please schedule a Follow-up Appointment to: Return in about 2 months (around 09/23/2017) for HTN, COPD, Smoking?.  If you have any other questions or concerns, please feel free to call the clinic or send a message through [place]. You may also schedule an earlier appointment if necessary.  Additionally, you may be receiving a survey about your experience at our clinic within a few days to 1 week by e-mail or mail. We value your feedback.  Nobie Putnam, DO [place]

## 2017-07-16 DIAGNOSIS — L239 Allergic contact dermatitis, unspecified cause: Secondary | ICD-10-CM | POA: Diagnosis not present

## 2017-07-18 DIAGNOSIS — L239 Allergic contact dermatitis, unspecified cause: Secondary | ICD-10-CM | POA: Diagnosis not present

## 2017-07-23 DIAGNOSIS — L720 Epidermal cyst: Secondary | ICD-10-CM | POA: Diagnosis not present

## 2017-07-23 DIAGNOSIS — L239 Allergic contact dermatitis, unspecified cause: Secondary | ICD-10-CM | POA: Diagnosis not present

## 2017-07-23 DIAGNOSIS — L821 Other seborrheic keratosis: Secondary | ICD-10-CM | POA: Diagnosis not present

## 2017-09-10 DIAGNOSIS — L720 Epidermal cyst: Secondary | ICD-10-CM | POA: Diagnosis not present

## 2017-09-17 ENCOUNTER — Ambulatory Visit: Payer: Medicare HMO | Admitting: Internal Medicine

## 2017-09-17 DIAGNOSIS — L720 Epidermal cyst: Secondary | ICD-10-CM | POA: Diagnosis not present

## 2017-09-23 ENCOUNTER — Encounter: Payer: Self-pay | Admitting: Family Medicine

## 2017-09-23 ENCOUNTER — Ambulatory Visit (INDEPENDENT_AMBULATORY_CARE_PROVIDER_SITE_OTHER): Payer: Medicare HMO | Admitting: Family Medicine

## 2017-09-23 VITALS — BP 126/58 | HR 66 | Temp 98.4°F | Resp 16 | Ht 67.0 in | Wt 162.8 lb

## 2017-09-23 DIAGNOSIS — J432 Centrilobular emphysema: Secondary | ICD-10-CM

## 2017-09-23 DIAGNOSIS — Z1239 Encounter for other screening for malignant neoplasm of breast: Secondary | ICD-10-CM

## 2017-09-23 DIAGNOSIS — I10 Essential (primary) hypertension: Secondary | ICD-10-CM | POA: Diagnosis not present

## 2017-09-23 DIAGNOSIS — Z1231 Encounter for screening mammogram for malignant neoplasm of breast: Secondary | ICD-10-CM

## 2017-09-23 DIAGNOSIS — Z720 Tobacco use: Secondary | ICD-10-CM

## 2017-09-23 MED ORDER — ALBUTEROL SULFATE HFA 108 (90 BASE) MCG/ACT IN AERS: 2.0000 | INHALATION_SPRAY | RESPIRATORY_TRACT | 2 refills | Status: DC | PRN

## 2017-09-23 NOTE — Assessment & Plan Note (Signed)
Controlled HTN on current regimen - Home BP readings stable Complication with Mild-Mod CAD / PAD (carotid) OFF Thiazide, BB Followed by Cardiology  Plan:  1. Continue current BP regimen Amlodipine 5mg  daily, Losartan 100mg , Imdur 30mg  nightly - per chart review, patient to check bottles at home, she cannot recall med rec 2. Encourage improved lifestyle - low sodium diet, improve regular exercise 3. Continue monitor BP outside office, bring readings to next visit, if persistently >140/90 or new symptoms notify office sooner 4. Follow-up 6 months

## 2017-09-23 NOTE — Addendum Note (Signed)
Addended by: Olin Hauser on: 09/23/2017 10:52 PM   Modules accepted: Level of Service

## 2017-09-23 NOTE — Assessment & Plan Note (Signed)
Stable to improved breathing on maintenance anti-cholinergic Consistent with clinical diagnosis COPD with chronic smoking history Without exacerbation, less frequent dyspnea now - No recent PFTs/Spirometry, or treatment. Never seen pulm. - last CXR 05/2017, reviewed, unremarkable not entirely consistent with COPD  Plan: 1. Continue Spiriva 68mcg daily maintenance anticholinergic - encourage to adhere better daily use 2. Rx Albuterol rescue inhaler PRN  3. Future reconsider referral for PFTs vs pulmonology vs outpatient overnight oximetry testing - she declined will consider if worsening 4. Counseling to quit smoking, has failed wellbutrin - plan to switch to E Cig to help taper 5. Follow-up 3-6 mo

## 2017-09-23 NOTE — Assessment & Plan Note (Signed)
Active smoker, less amount of smoking same cigs >30 years Failed Wellbutrin x 1 month, decline to continue. NRT  Plan: 1. Counseling on tapering down, transition to [place], she will check if covered, may need rx 2. Follow-up progress within 6 months sooner if need

## 2017-09-23 NOTE — Patient Instructions (Addendum)
Thank you for coming to the clinic today.  1. Check BP pills at home  You should be taking: - Amlodipine 5mg  once daily for BP - Losartan 100mg  once daily for BP (also for heart)  Dr End - STOPPED the previous meds - Hydrochlorothiazide and Metoprolol last time in August, double check  2. Continue Spiriva DAILY - Written new rx Albuterol rescue inhaler as needed  3. You may try vaping and E-cigarettes - if cannot afford check to see if covered  For Mammogram screening for breast cancer  Call the [place] below anytime to schedule your own appointment now that order has been placed.  [facility] [place], [place] [address] Phone: [phone]  Please schedule a Follow-up Appointment to: Return in about 6 months (around 03/23/2018) for COPD, Smoking update, HTN.  If you have any other questions or concerns, please feel free to call the clinic or send a message through [place]. You may also schedule an earlier appointment if necessary.  Additionally, you may be receiving a survey about your experience at our clinic within a few days to 1 week by e-mail or mail. We value your feedback.  Nobie Putnam, DO [place]

## 2017-09-23 NOTE — Progress Notes (Signed)
Subjective:    Patient ID: Kristin Parker, female    DOB: 12-30-1950, 66 y.o.   MRN: [id number]  Kristin Parker is a 66 y.o. female presenting on 09/23/2017 for Hypertension   HPI   CHRONIC HTN: - Last visit with me 07/10/17, for HTN, no med changes were made at time, see prior notes for background information. - Home BP has been controlled - Today patient reports no new concerns, overall feels better now off the BB, Metoprolol, and thiazide now on CCB per Cardiology - Meds: Amlodipine 5mg  daily, Losartan 100mg , Imdur 30mg  nightly Lifestyle: - Diet: limited salt, no changes - Exercise: still limited due to chronic dyspnea, smoker - Admits improved edema Denies CP, dyspnea, HA, edema, dizziness / lightheadedness  Presumed COPD / TOBACCO ABUSE - Last visit with me 07/10/17, for smoking cessation on Wellbutrin and new rx Spiriva, see prior notes for background information. - Interval update she tried Wellbutrin x 1 month now remains off, due to not reducing her cravings. - Regarding breathing, today states it is much improved on Spiriva, using most days still skips some days forgets or not sure if needs. Would like to continue. Also requesting Albuterol rescue inhaler for PRN in future if need - Today she states has overall reduced AMOUNT smoked by reduced puffs on each cig and will throw away before finishing. No longer smoking in house. - She would like to try E Cigarette vaping to help quit and transition off, asking if covered will check - Admits to some night time wakening with "active mind" not anxiety or other concerns - Denies night-time awakening or dyspnea, wheezing, productive cough  Health Maintenance: Due for Flu Shot, declines today despite counseling on benefits Due for Mammogram, last done 2012, order placed for [facility] patient required to call to schedule.  Depression screen [facility] 2/9 09/23/2017 05/14/2017 12/18/2016  Decreased Interest 0 0 0  Down, Depressed,  Hopeless 0 0 0  PHQ - 2 Score 0 0 0  Altered sleeping 3 - -  Tired, decreased energy 0 - -  Change in appetite 0 - -  Feeling bad or failure about yourself  0 - -  Trouble concentrating 0 - -  Moving slowly or fidgety/restless 0 - -  Suicidal thoughts 0 - -  PHQ-9 Score 3 - -  Difficult doing work/chores Not difficult at all - -    Social History   Tobacco Use  . Smoking status: Current Every Day Smoker    Packs/day: 0.50    Years: 30.00    Pack years: 15.00    Types: Cigarettes  . Smokeless tobacco: Current User  Substance Use Topics  . Alcohol use: No    Alcohol/week: 0.0 oz  . Drug use: No    Review of Systems Per HPI unless specifically indicated above     Objective:    BP (!) 126/58   Pulse 66   Temp 98.4 F (36.9 C) (Oral)   Resp 16   Ht 5\' 7"  (1.702 m)   Wt 162 lb 12.8 oz (73.8 kg)   SpO2 97%   BMI 25.50 kg/m   Wt Readings from Last 3 Encounters:  09/23/17 162 lb 12.8 oz (73.8 kg)  07/10/17 160 lb 12.8 oz (72.9 kg)  06/25/17 161 lb (73 kg)    Physical Exam  Constitutional: She is oriented to person, place, and time. She appears well-developed and well-nourished. No distress.  Well-appearing, comfortable, cooperative  HENT:  Head: Normocephalic  and atraumatic.  Mouth/Throat: Oropharynx is clear and moist.  Eyes: Conjunctivae are normal. Right eye exhibits no discharge. Left eye exhibits no discharge.  Neck: Normal range of motion. Neck supple.  Cardiovascular: Normal rate, regular rhythm, normal heart sounds and intact distal pulses.  No murmur heard. Pulmonary/Chest: Effort normal and breath sounds normal. No respiratory distress. She has no wheezes. She has no rales.  Improved air movement overall, no wheezing, no crackles or rhonchi. Speaks full sentences. No coughing.  Musculoskeletal: Normal range of motion. She exhibits no edema.  Neurological: She is alert and oriented to person, place, and time.  Skin: Skin is warm and dry. No rash noted.  She is not diaphoretic. No erythema.  Psychiatric: She has a normal mood and affect. Her behavior is normal.  Well groomed, good eye contact, normal speech and thoughts. Not anxious appearing  Nursing note and vitals reviewed.    Results for orders placed or performed during the hospital encounter of 78/67/67  Basic metabolic panel  Result Value Ref Range   Sodium 142 135 - 145 mmol/L   Potassium 3.8 3.5 - 5.1 mmol/L   Chloride 107 101 - 111 mmol/L   CO2 28 22 - 32 mmol/L   Glucose, Bld 122 (H) 65 - 99 mg/dL   BUN 12 6 - 20 mg/dL   Creatinine, Ser 0.80 0.44 - 1.00 mg/dL   Calcium 9.3 8.9 - 10.3 mg/dL   GFR calc non Af Amer >60 >60 mL/min   GFR calc Af Amer >60 >60 mL/min   Anion gap 7 5 - 15  CBC with Differential/Platelet  Result Value Ref Range   WBC 7.6 3.6 - 11.0 K/uL   RBC 4.26 3.80 - 5.20 MIL/uL   Hemoglobin 13.3 12.0 - 16.0 g/dL   HCT 38.4 35.0 - 47.0 %   MCV 90.1 80.0 - 100.0 fL   MCH 31.1 26.0 - 34.0 pg   MCHC 34.5 32.0 - 36.0 g/dL   RDW 13.2 11.5 - 14.5 %   Platelets 239 150 - 440 K/uL   Neutrophils Relative % 58 %   Neutro Abs 4.4 1.4 - 6.5 K/uL   Lymphocytes Relative 31 %   Lymphs Abs 2.4 1.0 - 3.6 K/uL   Monocytes Relative 8 %   Monocytes Absolute 0.6 0.2 - 0.9 K/uL   Eosinophils Relative 2 %   Eosinophils Absolute 0.1 0 - 0.7 K/uL   Basophils Relative 1 %   Basophils Absolute 0.1 0 - 0.1 K/uL  Protime-INR  Result Value Ref Range   Prothrombin Time 12.6 11.4 - 15.2 seconds   INR 0.94       Assessment & Plan:   Problem List Items Addressed This Visit    COPD (chronic obstructive pulmonary disease) ([facility]) - Primary    Stable to improved breathing on maintenance anti-cholinergic Consistent with clinical diagnosis COPD with chronic smoking history Without exacerbation, less frequent dyspnea now - No recent PFTs/Spirometry, or treatment. Never seen pulm. - last CXR 05/2017, reviewed, unremarkable not entirely consistent with COPD  Plan: 1. Continue  Spiriva 21mcg daily maintenance anticholinergic - encourage to adhere better daily use 2. Rx Albuterol rescue inhaler PRN  3. Future reconsider referral for PFTs vs pulmonology vs outpatient overnight oximetry testing - she declined will consider if worsening 4. Counseling to quit smoking, has failed wellbutrin - plan to switch to E Cig to help taper 5. Follow-up 3-6 mo      Relevant Medications   albuterol (PROVENTIL HFA;VENTOLIN  HFA) 108 (90 Base) MCG/ACT inhaler   Essential hypertension    Controlled HTN on current regimen - Home BP readings stable Complication with Mild-Mod CAD / PAD (carotid) OFF Thiazide, BB Followed by Cardiology  Plan:  1. Continue current BP regimen Amlodipine 5mg  daily, Losartan 100mg , Imdur 30mg  nightly - per chart review, patient to check bottles at home, she cannot recall med rec 2. Encourage improved lifestyle - low sodium diet, improve regular exercise 3. Continue monitor BP outside office, bring readings to next visit, if persistently >140/90 or new symptoms notify office sooner 4. Follow-up 6 months      Relevant Medications   rosuvastatin (CRESTOR) 40 MG tablet   amLODipine (NORVASC) 5 MG tablet   Tobacco abuse    Active smoker, less amount of smoking same cigs >30 years Failed Wellbutrin x 1 month, decline to continue. NRT  Plan: 1. Counseling on tapering down, transition to [place], she will check if covered, may need rx 2. Follow-up progress within 6 months sooner if need       Other Visit Diagnoses    Screening for breast cancer       Relevant Orders   MM DIGITAL SCREENING BILATERAL      Meds ordered this encounter  Medications  . albuterol (PROVENTIL HFA;VENTOLIN HFA) 108 (90 Base) MCG/ACT inhaler    Sig: Inhale 2 puffs into the lungs every 4 (four) hours as needed for wheezing or shortness of breath (cough).    Dispense:  1 Inhaler    Refill:  2    Follow up plan: Return in about 6 months (around 03/23/2018) for COPD,  Smoking update, HTN.  Nobie Putnam, [facility] 09/23/2017, 10:52 PM

## 2017-09-24 DIAGNOSIS — L720 Epidermal cyst: Secondary | ICD-10-CM | POA: Diagnosis not present

## 2017-10-02 ENCOUNTER — Encounter: Payer: Self-pay | Admitting: Internal Medicine

## 2017-10-02 ENCOUNTER — Ambulatory Visit (INDEPENDENT_AMBULATORY_CARE_PROVIDER_SITE_OTHER): Payer: Medicare HMO | Admitting: Internal Medicine

## 2017-10-02 VITALS — BP 128/60 | HR 85 | Ht 67.0 in | Wt 159.5 lb

## 2017-10-02 DIAGNOSIS — I25118 Atherosclerotic heart disease of native coronary artery with other forms of angina pectoris: Secondary | ICD-10-CM | POA: Diagnosis not present

## 2017-10-02 DIAGNOSIS — Z720 Tobacco use: Secondary | ICD-10-CM | POA: Diagnosis not present

## 2017-10-02 DIAGNOSIS — I6523 Occlusion and stenosis of bilateral carotid arteries: Secondary | ICD-10-CM

## 2017-10-02 DIAGNOSIS — I10 Essential (primary) hypertension: Secondary | ICD-10-CM | POA: Diagnosis not present

## 2017-10-02 DIAGNOSIS — E782 Mixed hyperlipidemia: Secondary | ICD-10-CM

## 2017-10-02 NOTE — Patient Instructions (Addendum)
Medication Instructions:  Your physician recommends that you continue on your current medications as directed. Please refer to the Current Medication list given to you today.   Labwork: Your physician recommends that you return for lab work in: January 2019 at the [facility] (LIP[place], CM[place]). - You will need to be FASTING. DO NOT EAT OR DRINK after midnight the day of the labs. You may have water.  - No appointment needed.  - Please go to the [facility]. You will check in at the front desk to the right as you walk into the atrium. Valet Parking is offered if needed.    Testing/Procedures: NONE  Follow-Up: Your physician wants you to follow-up in: 6 MONTHS WITH DR END. You will receive a reminder letter in the mail two months in advance. If you don't receive a letter, please call our office to schedule the follow-up appointment.   If you need a refill on your cardiac medications before your next appointment, please call your pharmacy.    Steps to Quit Smoking Smoking tobacco can be bad for your health. It can also affect almost every organ in your body. Smoking puts you and people around you at risk for many serious long-lasting (chronic) diseases. Quitting smoking is hard, but it is one of the best things that you can do for your health. It is never too late to quit. What are the benefits of quitting smoking? When you quit smoking, you lower your risk for getting serious diseases and conditions. They can include:  Lung cancer or lung disease.  Heart disease.  Stroke.  Heart attack.  Not being able to have children (infertility).  Weak bones (osteoporosis) and broken bones (fractures).  If you have coughing, wheezing, and shortness of breath, those symptoms may get better when you quit. You may also get sick less often. If you are pregnant, quitting smoking can help to lower your chances of having a baby of low birth weight. What can I do to help me quit  smoking? Talk with your doctor about what can help you quit smoking. Some things you can do (strategies) include:  Quitting smoking totally, instead of slowly cutting back how much you smoke over a period of time.  Going to in-person counseling. You are more likely to quit if you go to many counseling sessions.  Using resources and support systems, such as: ? Database administrator with a Social worker. ? Phone quitlines. ? Careers information officer. ? Support groups or group counseling. ? Text messaging programs. ? Mobile phone apps or applications.  Taking medicines. Some of these medicines may have nicotine in them. If you are pregnant or breastfeeding, do not take any medicines to quit smoking unless your doctor says it is okay. Talk with your doctor about counseling or other things that can help you.  Talk with your doctor about using more than one strategy at the same time, such as taking medicines while you are also going to in-person counseling. This can help make quitting easier. What things can I do to make it easier to quit? Quitting smoking might feel very hard at first, but there is a lot that you can do to make it easier. Take these steps:  Talk to your family and friends. Ask them to support and encourage you.  Call phone quitlines, reach out to support groups, or work with a Social worker.  Ask people who smoke to not smoke around you.  Avoid places that make you want (trigger) to  smoke, such as: ? Bars. ? Parties. ? Smoke-break areas at work.  Spend time with people who do not smoke.  Lower the stress in your life. Stress can make you want to smoke. Try these things to help your stress: ? Getting regular exercise. ? Deep-breathing exercises. ? Yoga. ? Meditating. ? Doing a body scan. To do this, close your eyes, focus on one area of your body at a time from head to toe, and notice which parts of your body are tense. Try to relax the muscles in those areas.  Download or buy  apps on your mobile phone or tablet that can help you stick to your quit plan. There are many free apps, such as QuitGuide from the [employer] Office manager for Disease Control and Prevention). You can find more support from smokefree.gov and other websites.  This information is not intended to replace advice given to you by your health care provider. Make sure you discuss any questions you have with your health care provider. Document Released: 08/11/2009 Document Revised: 06/12/2016 Document Reviewed: 03/01/2015 Elsevier Interactive Patient Education  2018 [employer].

## 2017-10-02 NOTE — Progress Notes (Signed)
Follow-up Outpatient Visit Date: 10/02/2017  Primary Care Provider: Olin Hauser, DO [address] [address]  Chief Complaint: Shortness of breath  HPI:  Kristin Parker is a 66 y.o. year-old female with history of coronary artery disease (up to 40% distal LMCA by cath in 05/2017), carotid artery stenosis, hypertension, hyperlipidemia, depression, and anxiety, who presents for follow-up of fatigue.  Today, Kristin Parker actually reports that she feels better than at prior visits.  She is most encouraged by decreased weakness and myalgias after switching from simvastatin to rosuvastatin in May.  She reports minimal chest pain following catheterization in August.  Chronic exertional dyspnea remains intermittent (sometimes she is able to walk without limitations, other times she notes shortness of breath even at rest).  Frequency and severity of her dyspnea is unchanged.  Unfortunately, she continues to smoke but is trying to cut back.  She has not had any orthopnea, PND, or edema.  She endorses occasional brief fluttering in her chest without accompanying symptoms.  This happens a few times a month without clear precipitants.  It is most noticeable when she is lying in bed at night.  Kristin Parker has been tolerating isosorbide mononitrate without headaches.  --------------------------------------------------------------------------------------------------  Cardiovascular History & Procedures: Cardiovascular Problems:  Aortic sclerosis  Carotid artery stenosis (mild to moderate)  Risk Factors:  Cerebrovascular disease, hypertension, hyperlipidemia, tobacco use, and age greater than 18  Cath/PCI:  LHC (06/25/17): Mild to moderate CAD, including 40% distal LMCA, 20% ostial LAD, 20-30% mid LAD, and sequential 30% proximal and mid LCx stenoses.  Small, nondominant RCA without significant disease.  Normal left ventricular filling pressure.  CV Surgery:  None  EP Procedures and  Devices:  None  Non-Invasive Evaluation(s):  ETT (06/18/17): Abnormal exercise tolerance test, the target heart rate not achieved.  1-2 mm horizontal ST depressions noted in inferior leads.  Patient only achieved 84% MPHR due to shortness of breath and fatigue.  Carotid artery Duplex (03/01/17): Smooth plaque, bilaterally. 40-59% RICA stenosis. 6-04% LICA stenosis. Patent vertebral arteries with antegrade flow. Normal subclavian arteries, bilaterally.  TTE (03/01/17): Normal LV size and function with LVEF 60-65% and grade 1 diastolic dysfunction. Mild MR. Normal RV size and function. Normal PA pressure.  Recent CV Pertinent Labs: Lab Results  Component Value Date   CHOL 147 05/28/2017   HDL 32 (L) 05/28/2017   LDLCALC NOT CALCULATED 05/28/2017   LDLDIRECT 62 05/28/2017   TRIG 417 (H) 05/28/2017   CHOLHDL 4.6 05/28/2017   INR 0.94 06/24/2017   BNP 53.0 01/27/2017   K 3.8 06/24/2017   BUN 12 06/24/2017   CREATININE 0.80 06/24/2017   CREATININE 0.83 08/14/2016    Past medical and surgical history were reviewed and updated in EPIC.  Current Meds  Medication Sig  . albuterol (PROVENTIL HFA;VENTOLIN HFA) 108 (90 Base) MCG/ACT inhaler Inhale 2 puffs into the lungs every 4 (four) hours as needed for wheezing or shortness of breath (cough).  Marland Kitchen amLODipine (NORVASC) 5 MG tablet Take 1 tablet (5 mg total) by mouth daily.  Marland Kitchen aspirin EC 81 MG tablet Take 1 tablet (81 mg total) by mouth daily.  . cetirizine (ZYRTEC) 10 MG tablet Take 10 mg by mouth daily.  . fluticasone (FLONASE) 50 MCG/ACT nasal spray Place 2 sprays into both nostrils daily.  . hydrOXYzine (ATARAX/VISTARIL) 10 MG tablet Take 1 tablet (10 mg total) by mouth 3 (three) times daily as needed for itching.  . losartan (COZAAR) 100 MG tablet  Take 1 tablet (100 mg total) by mouth daily.  . meloxicam (MOBIC) 15 MG tablet Take 1 tablet (15 mg total) by mouth daily as needed.  . mometasone (ELOCON) 0.1 % cream APP AA ON THE SKIN BID  .  mupirocin ointment (BACTROBAN) 2 % APPLY TOPICALLY ON THE SKIN DAILY PRN  . Omega-3 Fatty Acids (FISH OIL) 1000 MG CAPS Take 1 capsule (1,000 mg total) by mouth 2 (two) times daily.  . rosuvastatin (CRESTOR) 40 MG tablet Take 1 tablet (40 mg total) by mouth daily.  . sertraline (ZOLOFT) 100 MG tablet Take 1 tablet (100 mg total) by mouth daily.  Marland Kitchen tiotropium (SPIRIVA) 18 MCG inhalation capsule Place 1 capsule (18 mcg total) into inhaler and inhale daily.  Marland Kitchen triamcinolone cream (KENALOG) 0.5 % Apply 1 application topically 2 (two) times daily. To affected areas, for up to 2 weeks. Do not use on face.    Allergies: Aleve [naproxen sodium]  Social History   Socioeconomic History  . Marital status: Divorced    Spouse name: Not on file  . Number of children: Not on file  . Years of education: Not on file  . Highest education level: Not on file  Social Needs  . Financial resource strain: Not on file  . Food insecurity - worry: Not on file  . Food insecurity - inability: Not on file  . Transportation needs - medical: Not on file  . Transportation needs - non-medical: Not on file  Occupational History  . Not on file  Tobacco Use  . Smoking status: Current Every Day Smoker    Packs/day: 0.50    Years: 30.00    Pack years: 15.00    Types: Cigarettes  . Smokeless tobacco: Current User  Substance and Sexual Activity  . Alcohol use: No    Alcohol/week: 0.0 oz  . Drug use: No  . Sexual activity: Not on file  Other Topics Concern  . Not on file  Social History Narrative  . Not on file    Family History  Problem Relation Age of Onset  . Cancer Mother        bladder cancer  . Heart disease Mother 71       Pacemaker  . Cancer Father        lung  . Multiple sclerosis Sister   . Valvular heart disease Brother 30       s/p bioprosthetic valve replacement at [facility]    Review of Systems: Kristin Parker notes numbness and tingling in the tips of her fingers, predominantly the index and  middle fingers in both hands.  She also has chronic right shoulder pain and popping for which she occasionally uses meloxicam.  Otherwise, a 12-system review of systems was performed and was negative except as noted in the HPI.  --------------------------------------------------------------------------------------------------  Physical Exam: BP 128/60 (BP Location: Left Arm, Patient Position: Sitting, Cuff Size: Normal)   Pulse 85   Ht 5\' 7"  (1.702 m)   Wt 159 lb 8 oz (72.3 kg)   BMI 24.98 kg/m   General: Well-developed, well-nourished woman, seated comfortably in the exam room. HEENT: No conjunctival pallor or scleral icterus. Moist mucous membranes.  OP clear. Neck: Supple without lymphadenopathy, thyromegaly, JVD, or HJR. Lungs: Normal work of breathing. Clear to auscultation bilaterally without wheezes or crackles. Heart: Regular rate and rhythm without murmurs, rubs, or gallops. Non-displaced PMI. Abd: Bowel sounds present. Soft, NT/ND without hepatosplenomegaly Ext: No lower extremity edema. Radial, PT, and DP pulses  are 2+ bilaterally.  Right radial arteriotomy site is well-healed. Skin: Warm and dry without rash.  EKG: Normal sinus rhythm with sinus arrhythmia.  No significant abnormalities.  Lab Results  Component Value Date   WBC 7.6 06/24/2017   HGB 13.3 06/24/2017   HCT 38.4 06/24/2017   MCV 90.1 06/24/2017   PLT 239 06/24/2017    Lab Results  Component Value Date   NA 142 06/24/2017   K 3.8 06/24/2017   CL 107 06/24/2017   CO2 28 06/24/2017   BUN 12 06/24/2017   CREATININE 0.80 06/24/2017   GLUCOSE 122 (H) 06/24/2017   ALT 16 05/28/2017    Lab Results  Component Value Date   CHOL 147 05/28/2017   HDL 32 (L) 05/28/2017   LDLCALC NOT CALCULATED 05/28/2017   LDLDIRECT 62 05/28/2017   TRIG 417 (H) 05/28/2017   CHOLHDL 4.6 05/28/2017    --------------------------------------------------------------------------------------------------  ASSESSMENT AND  PLAN: Coronary artery disease with stable angina Catheterization revealed mild to moderate CAD, with 40% distal LMCA stenosis.  Ms. Rathel has minimal chest pain, which is well controlled with isosorbide mononitrate.  We will not make any medication changes today.  She has been intolerant of beta-blockers in the past due to marked fatigue and borderline bradycardia.  I have encouraged Ms. Moger to remain active and to stop smoking.  If she has progression of CAD, her best revascularization option would be bypass.  Carotid artery stenosis No new neurologic symptoms.  We will continue with medical therapy and plan to repeat carotid Dopplers this spring.  Hypertension Blood pressure is adequately controlled today.  We will continue with amlodipine, losartan, and isosorbide mononitrate.  Mixed hyperlipidemia Ms. Munce is tolerating rosuvastatin well.  A lipid panel in July was notable for LDL of 62 (goal less than 70) but significant hypertriglyceridemia (417).  We will plan to repeat a fasting lipid panel in January, as well as a CMP.  Ms. Westra should continue current doses of fish oil and rosuvastatin.  Finger tingling Question carpal tunnel syndrome.  I encouraged Ms. Henrene Pastor to follow-up with her PCP for further assessment.  Tobacco use Tobacco cessation was encouraged.  Ms. Holstine is hesitant to try Chantix or other medications at this time.  Follow-up: Return to clinic in 6 months.  [name], MD 10/02/2017 11:31 AM

## 2017-10-08 ENCOUNTER — Encounter: Payer: Self-pay | Admitting: Family Medicine

## 2017-10-08 ENCOUNTER — Ambulatory Visit (INDEPENDENT_AMBULATORY_CARE_PROVIDER_SITE_OTHER): Payer: Medicare HMO | Admitting: Family Medicine

## 2017-10-08 VITALS — HR 93 | Temp 98.2°F | Resp 16 | Ht 67.0 in | Wt 163.0 lb

## 2017-10-08 DIAGNOSIS — J0110 Acute frontal sinusitis, unspecified: Secondary | ICD-10-CM | POA: Diagnosis not present

## 2017-10-08 DIAGNOSIS — J432 Centrilobular emphysema: Secondary | ICD-10-CM | POA: Diagnosis not present

## 2017-10-08 MED ORDER — BENZONATATE 100 MG PO CAPS: 100.0000 mg | ORAL_CAPSULE | Freq: Three times a day (TID) | ORAL | 0 refills | Status: DC | PRN

## 2017-10-08 MED ORDER — AMOXICILLIN-POT CLAVULANATE 875-125 MG PO TABS: 1.0000 | ORAL_TABLET | Freq: Two times a day (BID) | ORAL | 0 refills | Status: DC

## 2017-10-08 MED ORDER — IPRATROPIUM BROMIDE 0.06 % NA SOLN: 2.0000 | Freq: Four times a day (QID) | NASAL | 0 refills | Status: DC

## 2017-10-08 NOTE — Progress Notes (Signed)
Subjective:    Patient ID: Kristin Parker, female    DOB: January 26, 1951, 66 y.o.   MRN: [id number]  Kristin Parker is a 66 y.o. female presenting on 10/08/2017 for Sinus Problem (sneezing, HA, sinus pressure onset 4 days)  Patient presents for a same day appointment.  HPI   SINUSITIS Reports symptoms started about 3-4 days ago with sinus congestion, sneezing, and worsening sinus pressure and production with drainage. No known sick contacts. Has been a while since last URI infection. She states history of progression to COPD flare or PNA in past if worsening. Now has more drainage and feels like starting to get into her chest. - No improvement on OTC DayQuil. Using Flonase limited relief. - Has known COPD, not needed albuterol, without coughing or production sputum - Admits bilateral ear pressure and pain - Difficulty sleeping at night - Denies any fevers/chills, dyspnea, wheezing, nausea vomiting, body or muscle aches  Health Maintenance: Due for Flu Shot, declines today despite counseling on benefits  Depression screen [facility] 2/9 09/23/2017 05/14/2017 12/18/2016  Decreased Interest 0 0 0  Down, Depressed, Hopeless 0 0 0  PHQ - 2 Score 0 0 0  Altered sleeping 3 - -  Tired, decreased energy 0 - -  Change in appetite 0 - -  Feeling bad or failure about yourself  0 - -  Trouble concentrating 0 - -  Moving slowly or fidgety/restless 0 - -  Suicidal thoughts 0 - -  PHQ-9 Score 3 - -  Difficult doing work/chores Not difficult at all - -    Social History   Tobacco Use  . Smoking status: Current Every Day Smoker    Packs/day: 0.50    Years: 30.00    Pack years: 15.00    Types: Cigarettes  . Smokeless tobacco: Current User  Substance Use Topics  . Alcohol use: No    Alcohol/week: 0.0 oz  . Drug use: No    Review of Systems Per HPI unless specifically indicated above     Objective:    Pulse 93   Temp 98.2 F (36.8 C) (Oral)   Resp 16   Ht 5\' 7"  (1.702 m)   Wt 163  lb (73.9 kg)   SpO2 97%   BMI 25.53 kg/m   Wt Readings from Last 3 Encounters:  10/08/17 163 lb (73.9 kg)  10/02/17 159 lb 8 oz (72.3 kg)  09/23/17 162 lb 12.8 oz (73.8 kg)    Physical Exam  Constitutional: She is oriented to person, place, and time. She appears well-developed and well-nourished. No distress.  Mildly ill appearing, slightly uncomfortable due to sinus pressure, cooperative  HENT:  Head: Normocephalic and atraumatic.  Mouth/Throat: Oropharynx is clear and moist.  Frontal > maxillary sinuses tender. Nares with some mild turbinate edema and congestion without purulence. Bilateral TMs clear without erythema, effusion or bulging. Oropharynx with mild posterior pharyngeal drainage without erythema, exudates, edema or asymmetry.  Eyes: Conjunctivae are normal. Right eye exhibits no discharge. Left eye exhibits no discharge.  Neck: Normal range of motion. Neck supple.  Cardiovascular: Normal rate, regular rhythm, normal heart sounds and intact distal pulses.  No murmur heard. Pulmonary/Chest: Effort normal. No respiratory distress. She has no wheezes. She has no rales.  Mild reduced air movement at baseline. Speaks full sentences. No coughing. No focal abnormal breath sounds.  Musculoskeletal: She exhibits no edema.  Lymphadenopathy:    She has no cervical adenopathy.  Neurological: She is alert and oriented to  person, place, and time.  Skin: Skin is warm and dry. No rash noted. She is not diaphoretic. No erythema.  Psychiatric: Her behavior is normal.  Nursing note and vitals reviewed.  Results for orders placed or performed during the hospital encounter of 52/84/13  Basic metabolic panel  Result Value Ref Range   Sodium 142 135 - 145 mmol/L   Potassium 3.8 3.5 - 5.1 mmol/L   Chloride 107 101 - 111 mmol/L   CO2 28 22 - 32 mmol/L   Glucose, Bld 122 (H) 65 - 99 mg/dL   BUN 12 6 - 20 mg/dL   Creatinine, Ser 0.80 0.44 - 1.00 mg/dL   Calcium 9.3 8.9 - 10.3 mg/dL   GFR  calc non Af Amer >60 >60 mL/min   GFR calc Af Amer >60 >60 mL/min   Anion gap 7 5 - 15  CBC with Differential/Platelet  Result Value Ref Range   WBC 7.6 3.6 - 11.0 K/uL   RBC 4.26 3.80 - 5.20 MIL/uL   Hemoglobin 13.3 12.0 - 16.0 g/dL   HCT 38.4 35.0 - 47.0 %   MCV 90.1 80.0 - 100.0 fL   MCH 31.1 26.0 - 34.0 pg   MCHC 34.5 32.0 - 36.0 g/dL   RDW 13.2 11.5 - 14.5 %   Platelets 239 150 - 440 K/uL   Neutrophils Relative % 58 %   Neutro Abs 4.4 1.4 - 6.5 K/uL   Lymphocytes Relative 31 %   Lymphs Abs 2.4 1.0 - 3.6 K/uL   Monocytes Relative 8 %   Monocytes Absolute 0.6 0.2 - 0.9 K/uL   Eosinophils Relative 2 %   Eosinophils Absolute 0.1 0 - 0.7 K/uL   Basophils Relative 1 %   Basophils Absolute 0.1 0 - 0.1 K/uL  Protime-INR  Result Value Ref Range   Prothrombin Time 12.6 11.4 - 15.2 seconds   INR 0.94       Assessment & Plan:   Problem List Items Addressed This Visit    None    Visit Diagnoses    Acute non-recurrent frontal sinusitis    -  Primary  # COPD uncomplicated without exacerbation  Consistent with acute frontal sinusitis, likely initially viral URI vs allergic rhinitis component with worsening concern for bacterial infection now with predominant sinus pain pressure, and concern with high risk patient may have potential progression to COPD exac if untreated sinusitis  Plan: 1. Start Augmentin 875-125mg  PO BID x 10 days - Start Atrovent nasal spray decongestant 2 sprays in each nostril up to 4 times daily for 7 days - Start Tessalon Perls take 1 capsule up to 3 times a day as needed for cough. Patient requesting Tussionex cough syrup rx has worked for her in past, discussed concerns with codeine and controlled substance prescribing, agree to hold for now, if progression of cough vs COPD consider rx Prednisone - Continue Flonase - Start OTC Mucinex sinus for 3-5 days, may consider regular mucinex after, caution high BP - Improve hydration - May use albuterol  PRN  Return criteria reviewed    Relevant Medications   ipratropium (ATROVENT) 0.06 % nasal spray   amoxicillin-clavulanate (AUGMENTIN) 875-125 MG tablet   benzonatate (TESSALON) 100 MG capsule      Meds ordered this encounter  Medications  . ipratropium (ATROVENT) 0.06 % nasal spray    Sig: Place 2 sprays into both nostrils 4 (four) times daily. For up to 5-7 days then stop.    Dispense:  15 mL  Refill:  0  . amoxicillin-clavulanate (AUGMENTIN) 875-125 MG tablet    Sig: Take 1 tablet by mouth 2 (two) times daily.    Dispense:  20 tablet    Refill:  0  . benzonatate (TESSALON) 100 MG capsule    Sig: Take 1 capsule (100 mg total) by mouth 3 (three) times daily as needed for cough.    Dispense:  30 capsule    Refill:  0    Follow up plan: Return in about 2 weeks (around 10/22/2017), or if symptoms worsen or fail to improve, for sinusitis.  Nobie Putnam, [facility] 10/08/2017, 11:23 AM

## 2017-10-08 NOTE — Patient Instructions (Addendum)
Thank you for coming to the clinic today.  1. It sounds like you have a Sinusitis (Bacterial Infection) - this most likely started as an Upper Respiratory Virus that has settled into an infection. Allergies can also cause this. - Start Augmentin 1 pill twice daily (breakfast and dinner, with food and plenty of water) for 10 days, complete entire course, do not stop early even if feeling better  Start Atrovent nasal spray decongestant 2 sprays in each nostril up to 4 times daily for 7 days  Continue Flonase  - Recommend to start keep using Nasal Saline spray multiple times a day to help flush out congestion and clear sinuses  - Improve hydration by drinking plenty of clear fluids (water, gatorade) to reduce secretions and thin congestion  - Congestion draining down throat can cause irritation. May try warm herbal tea with honey, cough drops  - Can take Tylenol or Ibuprofen as needed for fevers  May take OTC Mucinex Sinus for 3-5 days, cautious with taking longer than 1 week  May continue over the counter cold medicine as you are, I would not use any decongestant or mucinex longer than 7 days.  If you develop persistent fever >101F for at least 3 consecutive days, headaches with sinus pain or pressure or persistent earache, please schedule a follow-up evaluation within next few days to week.  Please schedule a Follow-up Appointment to: Return in about 2 weeks (around 10/22/2017), or if symptoms worsen or fail to improve, for sinusitis.  If you have any other questions or concerns, please feel free to call the clinic or send a message through [place]. You may also schedule an earlier appointment if necessary.  Additionally, you may be receiving a survey about your experience at our clinic within a few days to 1 week by e-mail or mail. We value your feedback.  Nobie Putnam, DO [place]

## 2017-10-23 ENCOUNTER — Other Ambulatory Visit: Payer: Self-pay | Admitting: Family Medicine

## 2017-10-23 DIAGNOSIS — G8929 Other chronic pain: Secondary | ICD-10-CM

## 2017-10-23 DIAGNOSIS — M25562 Pain in left knee: Principal | ICD-10-CM

## 2017-12-09 ENCOUNTER — Other Ambulatory Visit: Payer: Self-pay | Admitting: Family Medicine

## 2017-12-09 DIAGNOSIS — J0110 Acute frontal sinusitis, unspecified: Secondary | ICD-10-CM

## 2017-12-20 ENCOUNTER — Other Ambulatory Visit: Payer: Self-pay | Admitting: Family Medicine

## 2017-12-20 DIAGNOSIS — J3089 Other allergic rhinitis: Secondary | ICD-10-CM

## 2017-12-20 MED ORDER — FLUTICASONE PROPIONATE 50 MCG/ACT NA SUSP: 2.0000 | Freq: Every day | NASAL | 5 refills | Status: DC

## 2017-12-20 NOTE — Telephone Encounter (Addendum)
Pt stopped by requesting a refill on fluticasone called into  [facility]

## 2018-01-01 ENCOUNTER — Encounter: Payer: Self-pay | Admitting: Family Medicine

## 2018-01-01 ENCOUNTER — Ambulatory Visit
Admission: RE | Admit: 2018-01-01 | Discharge: 2018-01-01 | Disposition: A | Discharge status: Home / Self Care | Payer: Medicare HMO | Source: Ambulatory Visit | Attending: Family Medicine | Admitting: Family Medicine

## 2018-01-01 ENCOUNTER — Ambulatory Visit (INDEPENDENT_AMBULATORY_CARE_PROVIDER_SITE_OTHER): Payer: Medicare HMO | Admitting: Family Medicine

## 2018-01-01 VITALS — BP 117/58 | HR 66 | Temp 98.3°F | Resp 16 | Ht 67.0 in | Wt 160.0 lb

## 2018-01-01 DIAGNOSIS — G8929 Other chronic pain: Secondary | ICD-10-CM | POA: Insufficient documentation

## 2018-01-01 DIAGNOSIS — J3089 Other allergic rhinitis: Secondary | ICD-10-CM | POA: Diagnosis not present

## 2018-01-01 DIAGNOSIS — M25511 Pain in right shoulder: Principal | ICD-10-CM

## 2018-01-01 DIAGNOSIS — M19011 Primary osteoarthritis, right shoulder: Secondary | ICD-10-CM | POA: Insufficient documentation

## 2018-01-01 MED ORDER — MELOXICAM 15 MG PO TABS: 15.0000 mg | ORAL_TABLET | Freq: Every day | ORAL | 5 refills | Status: DC | PRN

## 2018-01-01 MED ORDER — LIDOCAINE HCL (PF) 1 % IJ SOLN
4.0000 mL | Freq: Once | INTRAMUSCULAR | Status: AC
  Administered 2018-01-01: 4 mL

## 2018-01-01 MED ORDER — METHYLPREDNISOLONE ACETATE 40 MG/ML IJ SUSP
40.0000 mg | Freq: Once | INTRAMUSCULAR | Status: AC
  Administered 2018-01-01: 40 mg via INTRA_ARTICULAR

## 2018-01-01 MED ORDER — FLUTICASONE PROPIONATE 50 MCG/ACT NA SUSP: 2.0000 | Freq: Every day | NASAL | 5 refills | Status: DC

## 2018-01-01 NOTE — Patient Instructions (Addendum)
Thank you for coming to the office today.  1.  You received a Right Shoulder Joint steroid injection today. - Lidocaine numbing medicine may ease the pain initially for a few hours until it wears off - As discussed, you may experience a "steroid flare" this evening or within 24-48 hours, anytime medicine is injected into an inflamed joint it can cause the pain to get worse temporarily - Everyone responds differently to these injections, it depends on the patient and the severity of the joint problem, it may provide anywhere from days to weeks, to months of relief. Ideal response is >6 months relief - Try to take it easy for next 1-2 days, avoid over activity and strain on joint (limit lifting for shoulder) - Recommend the following:   - For swelling - rest, compression sleeve / ACE wrap, elevation, and ice packs as needed for first few days   - For pain in future may use heating pad or moist heat as needed  Medication Continue meloxicam - try to take 1-2 weeks at a time then skip sometimes  Recommend to start taking Tylenol Extra Strength 500mg  tabs - take 1 to 2 tabs per dose (max 1000mg ) every 6-8 hours for pain (take regularly, don't skip a dose for next 7 days), max 24 hour daily dose is 6 tablets or 3000mg . In the future you can repeat the same everyday Tylenol course for 1-2 weeks at a time.   Cannot take ibuprofen advil aleve naproxen mobic meloxicam - but TYLENOL is safe  If not improving as expected over next several weeks, please follow-up sooner for re-evaluation.   Please schedule a Follow-up Appointment to: Return in about 4 weeks (around 01/29/2018) for R shoulder pain.    If you have any other questions or concerns, please feel free to call the office or send a message through [place]. You may also schedule an earlier appointment if necessary.  Additionally, you may be receiving a survey about your experience at our office within a few days to 1 week by e-mail or mail. We value  your feedback.  Nobie Putnam, DO [place]

## 2018-01-01 NOTE — Progress Notes (Signed)
Subjective:    Patient ID: Kristin Parker, female    DOB: 05-Aug-1951, 67 y.o.   MRN: [id number]  Kristin Parker is a 67 y.o. 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 She has had a chronic aching R shoulder, seems to be gradually worsening, now over past >3 months she has had significant worsening in R shoulder pain. Previously pain is more constant and moderate, now with worsening up to 7 out of 10 pain. Worse with lifting shoulder, feels a painful "catch" midway through ROM but then can lift it, but her range has reduced overall. - No history of fracture or dislocation of shoulder - Taking Meloxicam 15mg  most days, recently out of rx needs refill, significant relief - Not taking Tylenol, no particular reason - Tried bio freeze, hot packs without relief - No prior X-ray or imaging - History of arthritis in other joints including knees - admits knees seem improved - Denies injury, trauma, numbness weakness tingling, fall redness or swelling, other joint worsening or pain  Depression screen [facility] 2/9 09/23/2017 05/14/2017 12/18/2016  Decreased Interest 0 0 0  Down, Depressed, Hopeless 0 0 0  PHQ - 2 Score 0 0 0  Altered sleeping 3 - -  Tired, decreased energy 0 - -  Change in appetite 0 - -  Feeling bad or failure about yourself  0 - -  Trouble concentrating 0 - -  Moving slowly or fidgety/restless 0 - -  Suicidal thoughts 0 - -  PHQ-9 Score 3 - -  Difficult doing work/chores Not difficult at all - -    Social History    Tobacco Use  . Smoking status: Current Every Day Smoker    Packs/day: 0.50    Years: 30.00    Pack years: 15.00    Types: Cigarettes  . Smokeless tobacco: Current User  Substance Use Topics  . Alcohol use: No    Alcohol/week: 0.0 oz  . Drug use: No    Review of Systems Per HPI unless specifically indicated above     Objective:    BP (!) 117/58   Pulse 66   Temp 98.3 F (36.8 C) (Oral)   Resp 16   Ht 5\' 7"  (1.702 m)   Wt 160 lb (72.6 kg)   SpO2 95%   BMI 25.06 kg/m   Wt Readings from Last 3 Encounters:  01/01/18 160 lb (72.6 kg)  10/08/17 163 lb (73.9 kg)  10/02/17 159 lb 8 oz (72.3 kg)    Physical Exam  Constitutional: She is oriented to person, place, and time. She appears well-developed and well-nourished. No distress.  Well-appearing, slightly uncomfortable w/ R shoulder, cooperative  HENT:  Head: Normocephalic and atraumatic.  Mouth/Throat: Oropharynx is clear and moist.  Eyes: Conjunctivae are normal. Right eye exhibits no discharge. Left eye exhibits no discharge.  Cardiovascular: Normal rate.  Pulmonary/Chest: Effort normal.  Musculoskeletal: She exhibits no edema.  Right Shoulder Inspection: Normal appearance bilateral symmetrical Palpation: Mild tender to palpation over anterior,  lateral shoulder  ROM: Significantly limited active ROM - forward flex can get above shoulder with significantly painful arch >90* then improved, abduction is limited to < shoulder level, and unable to do internal rotation. Asymmetrical compared to L shoulder Special Testing: Rotator cuff testing negative for weakness with supraspinatus full can and empty can test, some reproduced pain but without weakness, Hawkin's AC impingement positive for pain R shoulder Strength: Normal strength 5/5 flex/ext, ext rot / int rot, grip, rotator cuff str testing. Neurovascular: Distally intact pulses, sensation to light touch  Neurological: She is alert and oriented to person, place, and  time.  Skin: Skin is warm and dry. No rash noted. She is not diaphoretic. No erythema.  Psychiatric: She has a normal mood and affect. Her behavior is normal.  Well groomed, good eye contact, normal speech and thoughts  Nursing note and vitals reviewed.  ________________________________________________________ PROCEDURE NOTE Date: 01/01/18 Right Shoulder subacromial steroid injection Discussed benefits and risks (including pain, bleeding, infection, steroid flare). Verbal consent given by patient. Medication:  1 cc Depo-medrol 40mg  and 4 cc Lidocaine 1% without epi Time Out taken  Landmarks identified. Area cleansed with alcohol wipes.Using 21 gauge and 1, 1/2 inch needle, Right subacromial bursa space was injected (with above listed medication) via posterior approach. Cold spray used for superficial anesthetic.Sterile bandage placed.Patient tolerated procedure well without bleeding or paresthesias.No complications.  Results for orders placed or performed during the hospital encounter of 74/12/87  Basic metabolic panel  Result Value Ref Range   Sodium 142 135 - 145 mmol/L   Potassium 3.8 3.5 - 5.1 mmol/L   Chloride 107 101 - 111 mmol/L   CO2 28 22 - 32 mmol/L   Glucose, Bld 122 (H) 65 - 99 mg/dL   BUN 12 6 - 20 mg/dL   Creatinine, Ser 0.80 0.44 - 1.00 mg/dL   Calcium 9.3 8.9 - 10.3 mg/dL   GFR calc non Af Amer >60 >60 mL/min   GFR calc Af Amer >60 >60 mL/min   Anion gap 7 5 - 15  CBC with Differential/Platelet  Result Value Ref Range   WBC 7.6 3.6 - 11.0 K/uL   RBC 4.26 3.80 - 5.20 MIL/uL   Hemoglobin 13.3 12.0 - 16.0 g/dL   HCT 38.4 35.0 - 47.0 %   MCV 90.1 80.0 - 100.0 fL   MCH 31.1 26.0 - 34.0 pg   MCHC 34.5 32.0 - 36.0 g/dL   RDW 13.2 11.5 - 14.5 %   Platelets 239 150 - 440 K/uL   Neutrophils Relative % 58 %   Neutro Abs 4.4 1.4 - 6.5 K/uL   Lymphocytes Relative 31 %   Lymphs Abs 2.4 1.0 - 3.6 K/uL   Monocytes Relative 8 %   Monocytes Absolute 0.6 0.2 - 0.9 K/uL    Eosinophils Relative 2 %   Eosinophils Absolute 0.1 0 - 0.7 K/uL   Basophils Relative 1 %   Basophils Absolute 0.1 0 - 0.1 K/uL  Protime-INR  Result Value Ref Range   Prothrombin Time 12.6 11.4 - 15.2 seconds   INR 0.94       Assessment & Plan:   Problem List Items Addressed This Visit    Chronic right shoulder pain - Primary  Consistent with subacute on chronic R-shoulder pain likely bursitis without evidence of rotator cuff tendinopathy - Known repetitive overhead/strenuous activity as likely etiology, in 67 yr old patient with known underlying arthritis in other joints - No imaging on chart for shoulder  Plan: X-ray R shoulder today Right shoulder subacromial steroid injection performed today, see procedure note for details.  1. Start rx Meloxicam NSAID 15mg  once daily (with food) for 1-2 weeks, then as needed 2. Recommended to take Tylenol Ex Str 1-2 q 6 hr PRN 3. Relative rest but keep shoulder mobile, demonstrated ROM exercises, avoid heavy lifting 4. May try heating pad PRN 5. Follow-up 4-6 weeks if not improved for re-evaluation, consider referral to Physical Therapy, and possibly other medicine options such as muscle relaxant - concern may need refer ortho if chronic issue >years    Relevant Medications   lidocaine (PF) (XYLOCAINE) 1 % injection 4 mL (Completed)   methylPREDNISolone acetate (DEPO-MEDROL) injection 40 mg (Completed)   meloxicam (MOBIC) 15 MG tablet   Other Relevant Orders   DG Shoulder Right    Other Visit Diagnoses    Seasonal allergic rhinitis due to other allergic trigger       Relevant Medications   fluticasone (FLONASE) 50 MCG/ACT nasal spray      Meds ordered this encounter  Medications  . lidocaine (PF) (XYLOCAINE) 1 % injection 4 mL  . methylPREDNISolone acetate (DEPO-MEDROL) injection 40 mg  . meloxicam (MOBIC) 15 MG tablet    Sig: Take 1 tablet (15 mg total) by mouth daily as needed for pain. May take for 1-2 weeks at a time then as  needed in future    Dispense:  30 tablet    Refill:  5  . fluticasone (FLONASE) 50 MCG/ACT nasal spray    Sig: Place 2 sprays into both nostrils daily.    Dispense:  16 g    Refill:  5     Follow up plan: Return in about 4 weeks (around 01/29/2018) for R shoulder pain.  Nobie Putnam, [facility] 01/01/2018, 11:27 PM

## 2018-01-25 ENCOUNTER — Other Ambulatory Visit: Payer: Self-pay | Admitting: Family Medicine

## 2018-01-25 DIAGNOSIS — J0110 Acute frontal sinusitis, unspecified: Secondary | ICD-10-CM

## 2018-03-12 ENCOUNTER — Other Ambulatory Visit: Payer: Self-pay

## 2018-03-12 ENCOUNTER — Other Ambulatory Visit: Payer: Self-pay | Admitting: Family Medicine

## 2018-03-12 DIAGNOSIS — I10 Essential (primary) hypertension: Secondary | ICD-10-CM

## 2018-03-12 DIAGNOSIS — J432 Centrilobular emphysema: Secondary | ICD-10-CM

## 2018-03-12 MED ORDER — ROSUVASTATIN CALCIUM 40 MG PO TABS: 40.0000 mg | ORAL_TABLET | Freq: Every day | ORAL | 3 refills | Status: DC

## 2018-05-07 ENCOUNTER — Telehealth: Payer: Self-pay | Admitting: Internal Medicine

## 2018-05-07 NOTE — Telephone Encounter (Signed)
Lmov for patient to call and schedule Carotid   Will try again at a later time

## 2018-05-09 NOTE — Telephone Encounter (Signed)
Lmov for patient to call and schedule Carotid   Will try again at a later time

## 2018-05-15 NOTE — Telephone Encounter (Signed)
Lmov for patient to call and schedule  Will await call back from patient

## 2018-05-16 ENCOUNTER — Encounter: Payer: Self-pay | Admitting: Internal Medicine

## 2018-05-16 NOTE — Telephone Encounter (Signed)
Sent letter

## 2018-05-28 ENCOUNTER — Telehealth: Payer: Self-pay | Admitting: Family Medicine

## 2018-05-28 NOTE — Telephone Encounter (Signed)
Called to schedule Medicare Annual Wellness Visit with Nurse Health Advisor. If patient returns call, please schedule AWV with NHA any date  °Thank you! °For any questions please contact: °Kathryn Brown [phone]  °Or Skype me at: [email or web address]  ° ° °

## 2018-05-30 ENCOUNTER — Encounter: Payer: Self-pay | Admitting: Family Medicine

## 2018-05-30 ENCOUNTER — Other Ambulatory Visit: Payer: Self-pay | Admitting: Family Medicine

## 2018-05-30 ENCOUNTER — Ambulatory Visit (INDEPENDENT_AMBULATORY_CARE_PROVIDER_SITE_OTHER): Payer: Medicare HMO | Admitting: Family Medicine

## 2018-05-30 VITALS — BP 122/51 | HR 69 | Temp 99.0°F | Resp 16 | Ht 67.0 in | Wt 156.0 lb

## 2018-05-30 DIAGNOSIS — M25511 Pain in right shoulder: Secondary | ICD-10-CM

## 2018-05-30 DIAGNOSIS — M19011 Primary osteoarthritis, right shoulder: Secondary | ICD-10-CM

## 2018-05-30 DIAGNOSIS — F419 Anxiety disorder, unspecified: Secondary | ICD-10-CM

## 2018-05-30 DIAGNOSIS — G5603 Carpal tunnel syndrome, bilateral upper limbs: Secondary | ICD-10-CM | POA: Diagnosis not present

## 2018-05-30 DIAGNOSIS — G8929 Other chronic pain: Secondary | ICD-10-CM

## 2018-05-30 DIAGNOSIS — I10 Essential (primary) hypertension: Secondary | ICD-10-CM

## 2018-05-30 DIAGNOSIS — Z1159 Encounter for screening for other viral diseases: Secondary | ICD-10-CM

## 2018-05-30 DIAGNOSIS — R7309 Other abnormal glucose: Secondary | ICD-10-CM

## 2018-05-30 DIAGNOSIS — I25118 Atherosclerotic heart disease of native coronary artery with other forms of angina pectoris: Secondary | ICD-10-CM

## 2018-05-30 DIAGNOSIS — Z0000 Encounter for general adult medical examination without abnormal findings: Secondary | ICD-10-CM

## 2018-05-30 DIAGNOSIS — J432 Centrilobular emphysema: Secondary | ICD-10-CM

## 2018-05-30 DIAGNOSIS — E782 Mixed hyperlipidemia: Secondary | ICD-10-CM

## 2018-05-30 MED ORDER — METHYLPREDNISOLONE ACETATE 40 MG/ML IJ SUSP
40.0000 mg | Freq: Once | INTRAMUSCULAR | Status: AC
  Administered 2018-05-30: 40 mg via INTRA_ARTICULAR

## 2018-05-30 MED ORDER — LIDOCAINE HCL (PF) 1 % IJ SOLN
4.0000 mL | Freq: Once | INTRAMUSCULAR | Status: AC
  Administered 2018-05-30: 4 mL

## 2018-05-30 NOTE — Patient Instructions (Addendum)
Thank you for coming to the office today.  You received a Right Shoulder Joint steroid injection today. - Lidocaine numbing medicine may ease the pain initially for a few hours until it wears off - As discussed, you may experience a "steroid flare" this evening or within 24-48 hours, anytime medicine is injected into an inflamed joint it can cause the pain to get worse temporarily - Everyone responds differently to these injections, it depends on the patient and the severity of the joint problem, it may provide anywhere from days to weeks, to months of relief. Ideal response is >6 months relief - Try to take it easy for next 1-2 days, avoid over activity and strain on joint (limit lifting for shoulder) - Recommend the following:   - For swelling - rest, compression sleeve / ACE wrap, elevation, and ice packs as needed for first few days   - For pain in future may use heating pad or moist heat as needed  Medication - Keep taking Meloxicam  Referral has already been placed to Orthopedics, see below.  Prefer to schedule with them in about 4-6 weeks or longer if you can wait - if shot is helping - and they can determine next steps with possible deeper shoulder joint injection or possibly an MRI  [facility] [place], [place]  [address] Phone: [phone]   DUE for FASTING BLOOD WORK (no food or drink after midnight before the lab appointment, only water or coffee without cream/sugar on the morning of)  SCHEDULE "Lab Only" visit in the morning at the clinic for lab draw in 3 weeks  - Make sure Lab Only appointment is at about 1 week before your next appointment, so that results will be available  For Lab Results, once available within 2-3 days of blood draw, you can can log in to MyChart online to view your results and a brief explanation. Also, we can discuss results at next follow-up visit.   Please schedule a Follow-up  Appointment to: Return in about 4 weeks (around 06/24/2018) for keep scheduled f/u for annual physical / AMW on 8/27.  If you have any other questions or concerns, please feel free to call the office or send a message through [place]. You may also schedule an earlier appointment if necessary.  Additionally, you may be receiving a survey about your experience at our office within a few days to 1 week by e-mail or mail. We value your feedback.  Nobie Putnam, DO [place]

## 2018-05-30 NOTE — Progress Notes (Signed)
Subjective:    Patient ID: Kristin Parker, female    DOB: August 09, 1951, 67 y.o.   MRN: [id number]  Kristin Parker is a 67 y.o. female presenting on 05/30/2018 for Shoulder Pain (Right side onset month)   HPI   Right Shoulder Pain, Chronic - Last visit with me 12/2017, for initial visit for same problem R shoulder pain, treated with x-ray showed moderate DJD glenohumeral and AC joint, she was given steroid subacromial injection and rx meloxicam, see prior notes for background information. - Interval update with improvement initially, took few days for shot to start working, had about 60% improvement in pain overall for 2-3 months with improved motion, not waking up at night due to pain, she took meloxicam with improvement sometimes up to 90% improved, helped her sleep better at night, still taking as needed, not every day. Tylenol helped some initially but less now.  - Today patient reports pain has gradually worsened again as shot wore off, now about 5 months later has similar if not slightly worse pain and symptoms with R shoulder. Describes pain worse in evening when laying down and can wake her up at night. Even has pain just with arm resting at her side due to gravity at times. Worse with activities, limited in her range of motion still. She is doing some stretching exercises. Does not want to do physical therapy. Not seen Orthopedics - Interested in repeat injection - Last x-ray done 12/2017 - History of arthritis in other joints including knees - admits knees seem improved - Denies injury, trauma, weakness tingling, fall redness or swelling, other joint worsening or pain  Additional complaint today: - Bilateral Carpal Tunnel Syndrome - reports chronic history of some carpal tunnel symptoms previously diagnosed, she has R worse than L, has some numbness and burning pain at times in hand/fingers, no significant weakness, difficult to tell because of shoulder limitation. She has not tried  treatment but it was improved some on meloxicam.   Depression screen [facility] 2/9 05/30/2018 09/23/2017 05/14/2017  Decreased Interest 0 0 0  Down, Depressed, Hopeless 0 0 0  PHQ - 2 Score 0 0 0  Altered sleeping - 3 -  Tired, decreased energy - 0 -  Change in appetite - 0 -  Feeling bad or failure about yourself  - 0 -  Trouble concentrating - 0 -  Moving slowly or fidgety/restless - 0 -  Suicidal thoughts - 0 -  PHQ-9 Score - 3 -  Difficult doing work/chores - Not difficult at all -    Social History   Tobacco Use  . Smoking status: Current Every Day Smoker    Packs/day: 0.50    Years: 30.00    Pack years: 15.00    Types: Cigarettes  . Smokeless tobacco: Current User  Substance Use Topics  . Alcohol use: No    Alcohol/week: 0.0 oz  . Drug use: No    Review of Systems Per HPI unless specifically indicated above     Objective:    BP (!) 122/51   Pulse 69   Temp 99 F (37.2 C) (Oral)   Resp 16   Ht 5\' 7"  (1.702 m)   Wt 156 lb (70.8 kg)   BMI 24.43 kg/m   Wt Readings from Last 3 Encounters:  05/30/18 156 lb (70.8 kg)  01/01/18 160 lb (72.6 kg)  10/08/17 163 lb (73.9 kg)    Physical Exam  Constitutional: She is oriented to person, place, and time.  She appears well-developed and well-nourished. No distress.  Well-appearing, mostly comfortable except movement of R shoulder, cooperative  HENT:  Head: Normocephalic and atraumatic.  Mouth/Throat: Oropharynx is clear and moist.  Eyes: Conjunctivae are normal. Right eye exhibits no discharge. Left eye exhibits no discharge.  Cardiovascular: Normal rate.  Pulmonary/Chest: Effort normal.  Musculoskeletal: She exhibits no edema.  Right Shoulder Inspection: Normal appearance bilateral symmetrical Palpation: Still mild tender to palpation over anterior, lateral shoulder  ROM: Significantly limited active ROM - forward flex now < 90* forward flex or abduction, needs assistance with other arm above 90* due to pain. Reduce  internal rotation. Able to move shoulder not passively restricted but painful. Asymmetrical compared to L shoulder Special Testing: testing limited today due to pain but rotator cuff testing negative for weakness with supraspinatus full can and empty can test, some reproduced pain but without weakness, Hawkin's AC impingement still significantly positive for pain R shoulder Strength: Normal strength 5/5 flex/ext, ext rot / int rot, grip, rotator cuff str testing. Neurovascular: Distally intact pulses - notable reduced distal sensation to light touch Right hand 3-4-5th digits palmar  Bilateral Hand/Wrist Inspection: Mild bulky appearance, symmetrical, no edema or erythema. Palpation: Non tender hand / wrist, carpal bones, including MCP, base of thumb. No distinct anatomical snuff box or scaphoid tenderness.  ROM: full active wrist ROM flex / ext, ulnar / radial deviation Special Testing: some reproduced symptoms with Tinel's median nerve and phalen's R>L Strength: 5/5 grip, thumb opposition, wrist flex/ext Neurovascular: reduced sensation 3-4-5th digits   Neurological: She is alert and oriented to person, place, and time.  Skin: Skin is warm and dry. No rash noted. She is not diaphoretic. No erythema.  Psychiatric: She has a normal mood and affect. Her behavior is normal.  Well groomed, good eye contact, normal speech and thoughts  Nursing note and vitals reviewed.   _______________________________________________________ PROCEDURE NOTE Date: 05/30/18 Right shoulder subacromial injection (#2 - 1st done in 12/2017) Discussed benefits and risks (including pain, bleeding, infection, steroid flare). Verbal consent given by patient. Medication:  1 cc Depo-medrol 40mg  and 4 cc Lidocaine 1% without epi Time Out taken  Landmarks identified. Area cleansed with alcohol wipes.Using 21 gauge and 1, 1/2 inch needle, Right subacromial bursa space was injected (with above listed medication) via posterior  approach cold spray used for superficial anesthetic.Sterile bandage placed.Patient tolerated procedure well without bleeding or paresthesias.No complications.  ______________________________________________________________  I have personally reviewed the radiology report from 01/01/18 Right Shoulder X-ray.  CLINICAL DATA:  Chronic right shoulder pain for years. Worsening over the last couple months.  EXAM: RIGHT SHOULDER - 2+ VIEW  COMPARISON:  None.  FINDINGS: There is no fracture or dislocation. There is mild-moderate right glenohumeral joint space narrowing with bulky inferior humeral marginal osteophyte. There are mild degenerative changes of the acromioclavicular joint.  IMPRESSION: Moderate osteoarthritis of the right glenohumeral joint.   Electronically Signed   By: Kathreen Devoid   On: 01/02/2018 08:25  Results for orders placed or performed during the hospital encounter of 26/71/24  Basic metabolic panel  Result Value Ref Range   Sodium 142 135 - 145 mmol/L   Potassium 3.8 3.5 - 5.1 mmol/L   Chloride 107 101 - 111 mmol/L   CO2 28 22 - 32 mmol/L   Glucose, Bld 122 (H) 65 - 99 mg/dL   BUN 12 6 - 20 mg/dL   Creatinine, Ser 0.80 0.44 - 1.00 mg/dL   Calcium 9.3 8.9 - 10.3 mg/dL  GFR calc non Af Amer >60 >60 mL/min   GFR calc Af Amer >60 >60 mL/min   Anion gap 7 5 - 15  CBC with Differential/Platelet  Result Value Ref Range   WBC 7.6 3.6 - 11.0 K/uL   RBC 4.26 3.80 - 5.20 MIL/uL   Hemoglobin 13.3 12.0 - 16.0 g/dL   HCT 38.4 35.0 - 47.0 %   MCV 90.1 80.0 - 100.0 fL   MCH 31.1 26.0 - 34.0 pg   MCHC 34.5 32.0 - 36.0 g/dL   RDW 13.2 11.5 - 14.5 %   Platelets 239 150 - 440 K/uL   Neutrophils Relative % 58 %   Neutro Abs 4.4 1.4 - 6.5 K/uL   Lymphocytes Relative 31 %   Lymphs Abs 2.4 1.0 - 3.6 K/uL   Monocytes Relative 8 %   Monocytes Absolute 0.6 0.2 - 0.9 K/uL   Eosinophils Relative 2 %   Eosinophils Absolute 0.1 0 - 0.7 K/uL   Basophils Relative 1 %     Basophils Absolute 0.1 0 - 0.1 K/uL  Protime-INR  Result Value Ref Range   Prothrombin Time 12.6 11.4 - 15.2 seconds   INR 0.94       Assessment & Plan:   Problem List Items Addressed This Visit    Bilateral carpal tunnel syndrome Clinically with some persistent symptoms having numbness and neuropathic pain. Already treating inflammation with NSAID May use wrist splint Referral to Orthopedics for 2nd opinion - she may need [facility] study [facility] in future if they recommend - otherwise consider injection    Relevant Orders   Ambulatory referral to Orthopedic Surgery   Chronic right shoulder pain - Primary   Relevant Medications   lidocaine (PF) (XYLOCAINE) 1 % injection 4 mL (Completed)   methylPREDNISolone acetate (DEPO-MEDROL) injection 40 mg (Completed)   Other Relevant Orders   Ambulatory referral to Orthopedic Surgery    Other Visit Diagnoses    Primary osteoarthritis of right shoulder       Relevant Medications   lidocaine (PF) (XYLOCAINE) 1 % injection 4 mL (Completed)   methylPREDNISolone acetate (DEPO-MEDROL) injection 40 mg (Completed)   Other Relevant Orders   Ambulatory referral to Orthopedic Surgery      Consistent with recurrent chronic R-shoulder pain, gradual worsening Secondary underlying moderate glenohumeral and AC arthritis and likely some bursitis flare Concern for possible adhesive capsulitis given significant limited range of motion Cannot rule out rotator cuff tendinopathy - likely given degenerative symptoms - may need advanced imaging Known arthritis other joints X-ray results above  Plan: Right shoulder subacromial steroid injection performed today, see procedure note for details. 2nd injection in same shoulder (1st was 12/2017)   1. Continue rx Meloxicam NSAID 15mg  once daily (with food) for 1-2 weeks, then as needed 2. Recommended to take Tylenol Ex Str 1-2 q 6 hr PRN 3. Relative rest but keep shoulder mobile, demonstrated ROM  exercises, avoid heavy lifting 4. May try heating pad PRN 5. Follow-up as needed  Referral to [facility] for 2nd opinion - and further management since now requiring 2nd injection only partial result on 1st - may benefit from MRI in future also consider glenohumeral injection as option - may need surgical intervention   Meds ordered this encounter  Medications  . lidocaine (PF) (XYLOCAINE) 1 % injection 4 mL  . methylPREDNISolone acetate (DEPO-MEDROL) injection 40 mg    Orders Placed This Encounter  Procedures  . Ambulatory referral to Orthopedic Surgery  Referral Priority:   Routine    Referral Type:   Surgical    Referral Reason:   Specialty Services Required    Referred to Provider:   Dereck Leep, MD    Requested Specialty:   Orthopedic Surgery    Number of Visits Requested:   1    Follow up plan: Return in about 4 weeks (around 06/24/2018) for keep scheduled f/u for annual physical / AMW on 8/27.  Future labs ordered for 06/18/18  Nobie Putnam, [facility] 05/30/2018, 5:00 PM

## 2018-06-05 ENCOUNTER — Other Ambulatory Visit: Payer: Self-pay | Admitting: Internal Medicine

## 2018-06-18 ENCOUNTER — Other Ambulatory Visit: Payer: Medicare HMO

## 2018-06-18 DIAGNOSIS — Z1159 Encounter for screening for other viral diseases: Secondary | ICD-10-CM

## 2018-06-18 DIAGNOSIS — I25118 Atherosclerotic heart disease of native coronary artery with other forms of angina pectoris: Secondary | ICD-10-CM

## 2018-06-18 DIAGNOSIS — I10 Essential (primary) hypertension: Secondary | ICD-10-CM | POA: Diagnosis not present

## 2018-06-18 DIAGNOSIS — Z0000 Encounter for general adult medical examination without abnormal findings: Secondary | ICD-10-CM

## 2018-06-18 DIAGNOSIS — R7309 Other abnormal glucose: Secondary | ICD-10-CM | POA: Diagnosis not present

## 2018-06-18 DIAGNOSIS — E782 Mixed hyperlipidemia: Secondary | ICD-10-CM

## 2018-06-19 LAB — COMPLETE METABOLIC PANEL WITH GFR
AG Ratio: 1.4 (calc) (ref 1.0–2.5)
ALT: 11 U/L (ref 6–29)
AST: 13 U/L (ref 10–35)
Albumin: 4.1 g/dL (ref 3.6–5.1)
Alkaline phosphatase (APISO): 94 U/L (ref 33–130)
BILIRUBIN TOTAL: 0.4 mg/dL (ref 0.2–1.2)
BUN: 12 mg/dL (ref 7–25)
CHLORIDE: 104 mmol/L (ref 98–110)
CO2: 28 mmol/L (ref 20–32)
Calcium: 9.3 mg/dL (ref 8.6–10.4)
Creat: 0.91 mg/dL (ref 0.50–0.99)
GFR, Est African American: 75 mL/min/{1.73_m2} (ref >=60)
GFR, Est Non African American: 64 mL/min/{1.73_m2} (ref >=60)
GLUCOSE: 102 mg/dL — AB (ref 65–99)
Globulin: 2.9 g/dL (calc) (ref 1.9–3.7)
POTASSIUM: 4 mmol/L (ref 3.5–5.3)
Sodium: 141 mmol/L (ref 135–146)
Total Protein: 7 g/dL (ref 6.1–8.1)

## 2018-06-19 LAB — CBC WITH DIFFERENTIAL/PLATELET
Basophils Absolute: 48 cells/uL (ref 0–200)
Basophils Relative: 0.6 %
EOS PCT: 1.9 %
Eosinophils Absolute: 152 cells/uL (ref 15–500)
HEMATOCRIT: 41.2 % (ref 35.0–45.0)
HEMOGLOBIN: 13.8 g/dL (ref 11.7–15.5)
LYMPHS ABS: 2560 {cells}/uL (ref 850–3900)
MCH: 30.7 pg (ref 27.0–33.0)
MCHC: 33.5 g/dL (ref 32.0–36.0)
MCV: 91.6 fL (ref 80.0–100.0)
MONOS PCT: 8.3 %
MPV: 10.3 fL (ref 7.5–12.5)
NEUTROS PCT: 57.2 %
Neutro Abs: 4576 cells/uL (ref 1500–7800)
Platelets: 236 10*3/uL (ref 140–400)
RBC: 4.5 10*6/uL (ref 3.80–5.10)
RDW: 13 % (ref 11.0–15.0)
Total Lymphocyte: 32 %
WBC mixed population: 664 cells/uL (ref 200–950)
WBC: 8 10*3/uL (ref 3.8–10.8)

## 2018-06-19 LAB — LIPID PANEL
Cholesterol: 163 mg/dL (ref <200)
HDL: 44 mg/dL — ABNORMAL LOW (ref >50)
LDL CHOLESTEROL (CALC): 95 mg/dL
Non-HDL Cholesterol (Calc): 119 mg/dL (calc) (ref <130)
Total CHOL/HDL Ratio: 3.7 (calc) (ref <5.0)
Triglycerides: 137 mg/dL (ref <150)

## 2018-06-19 LAB — HEMOGLOBIN A1C
HEMOGLOBIN A1C: 5.6 %{Hb} (ref <5.7)
MEAN PLASMA GLUCOSE: 114 (calc)
eAG (mmol/L): 6.3 (calc)

## 2018-06-19 LAB — HEPATITIS C ANTIBODY
Hepatitis C Ab: NONREACTIVE
SIGNAL TO CUT-OFF: 0.02 (ref <1.00)

## 2018-06-20 ENCOUNTER — Other Ambulatory Visit: Payer: Self-pay | Admitting: Family Medicine

## 2018-06-20 ENCOUNTER — Encounter: Payer: Self-pay | Admitting: Family Medicine

## 2018-06-20 DIAGNOSIS — R7309 Other abnormal glucose: Secondary | ICD-10-CM | POA: Insufficient documentation

## 2018-06-24 ENCOUNTER — Encounter: Payer: Self-pay | Admitting: Family Medicine

## 2018-06-24 ENCOUNTER — Ambulatory Visit: Payer: Medicare HMO

## 2018-06-24 ENCOUNTER — Ambulatory Visit (INDEPENDENT_AMBULATORY_CARE_PROVIDER_SITE_OTHER): Payer: Medicare HMO | Admitting: Family Medicine

## 2018-06-24 ENCOUNTER — Ambulatory Visit (INDEPENDENT_AMBULATORY_CARE_PROVIDER_SITE_OTHER): Payer: Medicare HMO

## 2018-06-24 VITALS — BP 134/78 | HR 64 | Temp 98.4°F | Resp 16 | Ht 67.0 in | Wt 158.8 lb

## 2018-06-24 VITALS — BP 140/78 | HR 64 | Temp 98.4°F | Resp 16 | Ht 67.0 in | Wt 158.8 lb

## 2018-06-24 DIAGNOSIS — G8929 Other chronic pain: Secondary | ICD-10-CM

## 2018-06-24 DIAGNOSIS — M25511 Pain in right shoulder: Secondary | ICD-10-CM | POA: Diagnosis not present

## 2018-06-24 DIAGNOSIS — F419 Anxiety disorder, unspecified: Secondary | ICD-10-CM | POA: Diagnosis not present

## 2018-06-24 DIAGNOSIS — L239 Allergic contact dermatitis, unspecified cause: Secondary | ICD-10-CM | POA: Diagnosis not present

## 2018-06-24 DIAGNOSIS — R7309 Other abnormal glucose: Secondary | ICD-10-CM | POA: Diagnosis not present

## 2018-06-24 DIAGNOSIS — Z0000 Encounter for general adult medical examination without abnormal findings: Secondary | ICD-10-CM | POA: Diagnosis not present

## 2018-06-24 DIAGNOSIS — Z720 Tobacco use: Secondary | ICD-10-CM

## 2018-06-24 DIAGNOSIS — Z1239 Encounter for other screening for malignant neoplasm of breast: Secondary | ICD-10-CM

## 2018-06-24 DIAGNOSIS — Z1231 Encounter for screening mammogram for malignant neoplasm of breast: Secondary | ICD-10-CM

## 2018-06-24 DIAGNOSIS — J432 Centrilobular emphysema: Secondary | ICD-10-CM | POA: Diagnosis not present

## 2018-06-24 MED ORDER — HYDROXYZINE HCL 10 MG PO TABS: 10.0000 mg | ORAL_TABLET | Freq: Every evening | ORAL | 3 refills | Status: DC | PRN

## 2018-06-24 MED ORDER — SERTRALINE HCL 100 MG PO TABS: 100.0000 mg | ORAL_TABLET | Freq: Every day | ORAL | 3 refills | Status: DC

## 2018-06-24 MED ORDER — TIOTROPIUM BROMIDE MONOHYDRATE 18 MCG IN CAPS: 18.0000 ug | ORAL_CAPSULE | Freq: Every day | RESPIRATORY_TRACT | 11 refills | Status: DC

## 2018-06-24 NOTE — Progress Notes (Signed)
Subjective:    Patient ID: Kristin Parker, female    DOB: 1951/01/30, 67 y.o.   MRN: [id number]  Kristin Parker is a 67 y.o. female presenting on 06/24/2018 for Annual Exam  HPI   Here for Annual Physical and Lab Review. She has also already seen [facility] LPN today for her Annual Medicare Wellness visit.  Elevated A1c No prior history of elevated A1c, now lab shows A1c 5.6 Meds: Never on med Currently on ARB Lifestyle: - Diet (admits eats cookies and some sweets, not following low carb diet) - Exercise (limited) Denies hypoglycemia  COPD / Tobacco Abuse Reports taking Spiriva 1 puff daily with good results. Still trying to reduce smoking, now mostly in AM and after meals  Allergic rhinitis Followed by [facility] - was using long term flonase without significant results, difficulty breathing through nose - asking about surgery  Anxiety / Insomnia Continues on Sertraline 100mg  daily with good results.  FOLLOW-UP Chronic R shoulder pain Last visit with me 05/30/18, had subacromial R shoulder injection, moderate improvement maybe 50% improved  Health Maintenance: See HM tab, declined vaccines including FLU and PNA Ordered Mammogram - advised to schedule, last done years ago, agrees to get every few years for now.  Depression screen [facility] 2/9 06/24/2018 06/24/2018 05/30/2018  Decreased Interest 0 0 0  Down, Depressed, Hopeless 0 0 0  PHQ - 2 Score 0 0 0  Altered sleeping 0 - -  Tired, decreased energy 0 - -  Change in appetite 0 - -  Feeling bad or failure about yourself  0 - -  Trouble concentrating 0 - -  Moving slowly or fidgety/restless 0 - -  Suicidal thoughts 0 - -  PHQ-9 Score 0 - -  Difficult doing work/chores Not difficult at all - -    Past Medical History:  Diagnosis Date  . Anxiety   . Aortic valve sclerosis   . Bilateral carotid bruits   . Carotid artery stenosis   . Hypertension   . Mitral regurgitation   . Mixed hyperlipidemia   . Shoulder  pain   . Sleeping difficulties    Past Surgical History:  Procedure Laterality Date  . CATARACT EXTRACTION Left    2017  . COLONOSCOPY  2012   Dr Candace Cruise  . LEFT HEART CATH AND CORONARY ANGIOGRAPHY N/A 06/25/2017   Procedure: LEFT HEART CATH AND CORONARY ANGIOGRAPHY;  Surgeon: [name], MD;  Location: [facility];  Service: Cardiovascular;  Laterality: N/A;   Social History   Socioeconomic History  . Marital status: Divorced    Spouse name: Not on file  . Number of children: Not on file  . Years of education: Not on file  . Highest education level: GED or equivalent  Occupational History  . Not on file  Social Needs  . Financial resource strain: Not hard at all  . Food insecurity:    Worry: Never true    Inability: Never true  . Transportation needs:    Medical: No    Non-medical: No  Tobacco Use  . Smoking status: Current Every Day Smoker    Packs/day: 0.50    Years: 30.00    Pack years: 15.00    Types: Cigarettes  . Smokeless tobacco: Current User  Substance and Sexual Activity  . Alcohol use: No    Alcohol/week: 0.0 standard drinks  . Drug use: No  . Sexual activity: Not on file  Lifestyle  . Physical activity:  Days per week: 0 days    Minutes per session: 0 min  . Stress: Not at all  Relationships  . Social connections:    Talks on phone: More than three times a week    Gets together: More than three times a week    Attends religious service: Never    Active member of club or organization: No    Attends meetings of clubs or organizations: Never    Relationship status: Divorced  . Intimate partner violence:    Fear of current or ex partner: No    Emotionally abused: No    Physically abused: No    Forced sexual activity: No  Other Topics Concern  . Not on file  Social History Narrative  . Not on file   Family History  Problem Relation Age of Onset  . Cancer Mother        bladder cancer  . Heart disease Mother 24       Pacemaker  .  Cancer Father        lung  . Multiple sclerosis Sister   . Valvular heart disease Brother 71       s/p bioprosthetic valve replacement at [facility]   Current Outpatient Medications on File Prior to Visit  Medication Sig  . albuterol (PROVENTIL HFA;VENTOLIN HFA) 108 (90 Base) MCG/ACT inhaler INHALE 2 PUFFS INTO THE LUNGS EVERY 4 HOURS AS NEEDED FOR WHEEZING OR SHORTNESS OF BREATH OR COUGH  . amLODipine (NORVASC) 5 MG tablet TAKE 1 TABLET(5 MG) BY MOUTH DAILY  . aspirin EC 81 MG tablet Take 1 tablet (81 mg total) by mouth daily.  . cetirizine (ZYRTEC) 10 MG tablet Take 10 mg by mouth daily.  . fluticasone (FLONASE) 50 MCG/ACT nasal spray Place 2 sprays into both nostrils daily.  . isosorbide mononitrate (IMDUR) 30 MG 24 hr tablet TAKE 1 TABLET(30 MG) BY MOUTH DAILY  . losartan (COZAAR) 100 MG tablet Take 1 tablet (100 mg total) by mouth daily.  . meloxicam (MOBIC) 15 MG tablet Take 1 tablet (15 mg total) by mouth daily as needed for pain. May take for 1-2 weeks at a time then as needed in future  . mometasone (ELOCON) 0.1 % cream APP AA ON THE SKIN BID  . mupirocin ointment (BACTROBAN) 2 % APPLY TOPICALLY ON THE SKIN DAILY PRN  . Omega-3 Fatty Acids (FISH OIL) 1000 MG CAPS Take 1 capsule (1,000 mg total) by mouth 2 (two) times daily.  . rosuvastatin (CRESTOR) 40 MG tablet Take 1 tablet (40 mg total) by mouth daily.  Marland Kitchen triamcinolone cream (KENALOG) 0.5 % Apply 1 application topically 2 (two) times daily. To affected areas, for up to 2 weeks. Do not use on face.   No current facility-administered medications on file prior to visit.     Review of Systems  Constitutional: Negative for activity change, appetite change, chills, diaphoresis, fatigue and fever.  HENT: Negative for congestion and hearing loss.   Eyes: Negative for visual disturbance.  Respiratory: Negative for apnea, cough, chest tightness, shortness of breath and wheezing.   Cardiovascular: Negative for chest pain, palpitations and leg  swelling.  Gastrointestinal: Negative for abdominal pain, constipation, diarrhea, nausea and vomiting.  Endocrine: Negative for cold intolerance.  Genitourinary: Negative for difficulty urinating, dysuria, frequency and hematuria.  Musculoskeletal: Negative for arthralgias and neck pain.  Skin: Negative for rash.  Allergic/Immunologic: Negative for environmental allergies.  Neurological: Negative for dizziness, weakness, light-headedness, numbness and headaches.  Hematological: Negative for adenopathy.  Psychiatric/Behavioral: Negative for behavioral problems, dysphoric mood and sleep disturbance.   Per HPI unless specifically indicated above     Objective:    BP 134/78 (BP Location: Left Arm, Cuff Size: Normal)   Pulse 64   Temp 98.4 F (36.9 C) (Oral)   Resp 16   Ht 5\' 7"  (1.702 m)   Wt 158 lb 12.8 oz (72 kg)   BMI 24.87 kg/m   Wt Readings from Last 3 Encounters:  06/24/18 158 lb 12.8 oz (72 kg)  06/24/18 158 lb 12.8 oz (72 kg)  05/30/18 156 lb (70.8 kg)    Physical Exam  Constitutional: She is oriented to person, place, and time. She appears well-developed and well-nourished. No distress.  Well-appearing, comfortable, cooperative  HENT:  Head: Normocephalic and atraumatic.  Mouth/Throat: Oropharynx is clear and moist.  Turbinate edema and mild congestion  Eyes: Pupils are equal, round, and reactive to light. Conjunctivae and EOM are normal. Right eye exhibits no discharge. Left eye exhibits no discharge.  Neck: Normal range of motion. Neck supple. No thyromegaly present.  Cardiovascular: Normal rate, regular rhythm, normal heart sounds and intact distal pulses.  No murmur heard. Pulmonary/Chest: Effort normal and breath sounds normal. No respiratory distress. She has no wheezes. She has no rales.  Improved air movement overall, no wheezing, no crackles or rhonchi. Speaks full sentences. No coughing.  Abdominal: Soft. Bowel sounds are normal. She exhibits no distension  and no mass. There is no tenderness.  Musculoskeletal: She exhibits no edema or tenderness.  Upper / Lower Extremities: - Normal muscle tone, strength bilateral upper extremities 5/5, lower extremities 5/5  Right shoulder still has some reduced ROM but overall improved  Lymphadenopathy:    She has no cervical adenopathy.  Neurological: She is alert and oriented to person, place, and time.  Distal sensation intact to light touch all extremities  Skin: Skin is warm and dry. No rash noted. She is not diaphoretic. No erythema.  Psychiatric: She has a normal mood and affect. Her behavior is normal.  Well groomed, good eye contact, normal speech and thoughts. Not anxious appearing  Nursing note and vitals reviewed.  Results for orders placed or performed in visit on 06/18/18  Hepatitis C antibody  Result Value Ref Range   Hepatitis C Ab NON-REACTIVE NON-REACTI   SIGNAL TO CUT-OFF 0.02 <1.00  Lipid panel  Result Value Ref Range   Cholesterol 163 <200 mg/dL   HDL 44 (L) >50 mg/dL   Triglycerides 137 <150 mg/dL   LDL Cholesterol (Calc) 95 mg/dL (calc)   Total CHOL/HDL Ratio 3.7 <5.0 (calc)   Non-HDL Cholesterol (Calc) 119 <130 mg/dL (calc)  COMPLETE METABOLIC PANEL WITH GFR  Result Value Ref Range   Glucose, Bld 102 (H) 65 - 99 mg/dL   BUN 12 7 - 25 mg/dL   Creat 0.91 0.50 - 0.99 mg/dL   GFR, Est Non African American 64 > OR = 60 mL/min/1.48m2   GFR, Est African American 75 > OR = 60 mL/min/1.70m2   BUN/Creatinine Ratio NOT APPLICABLE 6 - 22 (calc)   Sodium 141 135 - 146 mmol/L   Potassium 4.0 3.5 - 5.3 mmol/L   Chloride 104 98 - 110 mmol/L   CO2 28 20 - 32 mmol/L   Calcium 9.3 8.6 - 10.4 mg/dL   Total Protein 7.0 6.1 - 8.1 g/dL   Albumin 4.1 3.6 - 5.1 g/dL   Globulin 2.9 1.9 - 3.7 g/dL (calc)   AG Ratio 1.4 1.0 -  2.5 (calc)   Total Bilirubin 0.4 0.2 - 1.2 mg/dL   Alkaline phosphatase (APISO) 94 33 - 130 U/L   AST 13 10 - 35 U/L   ALT 11 6 - 29 U/L  CBC with  Differential/Platelet  Result Value Ref Range   WBC 8.0 3.8 - 10.8 Thousand/uL   RBC 4.50 3.80 - 5.10 Million/uL   Hemoglobin 13.8 11.7 - 15.5 g/dL   HCT 41.2 35.0 - 45.0 %   MCV 91.6 80.0 - 100.0 fL   MCH 30.7 27.0 - 33.0 pg   MCHC 33.5 32.0 - 36.0 g/dL   RDW 13.0 11.0 - 15.0 %   Platelets 236 140 - 400 Thousand/uL   MPV 10.3 7.5 - 12.5 fL   Neutro Abs 4,576 1,500 - 7,800 cells/uL   Lymphs Abs 2,560 850 - 3,900 cells/uL   WBC mixed population 664 200 - 950 cells/uL   Eosinophils Absolute 152 15 - 500 cells/uL   Basophils Absolute 48 0 - 200 cells/uL   Neutrophils Relative % 57.2 %   Total Lymphocyte 32.0 %   Monocytes Relative 8.3 %   Eosinophils Relative 1.9 %   Basophils Relative 0.6 %  Hemoglobin A1c  Result Value Ref Range   Hgb A1c MFr Bld 5.6 <5.7 % of total Hgb   Mean Plasma Glucose 114 (calc)   eAG (mmol/L) 6.3 (calc)      Assessment & Plan:   Problem List Items Addressed This Visit    Allergic dermatitis   Relevant Medications   hydrOXYzine (ATARAX/VISTARIL) 10 MG tablet   Chronic anxiety    Stable, controlled Without panic or other flare Continues on SSRI nightly, Sertraline Add Hydroxyzine 10mg  nightly PRN for insomnia and anxiety      Relevant Medications   sertraline (ZOLOFT) 100 MG tablet   hydrOXYzine (ATARAX/VISTARIL) 10 MG tablet   Chronic right shoulder pain    Likely chronic rotator cuff or other pathology known DJD within R shoulder Follow-up as planned with Orthopedics, given limited benefit from injection 1 month ago      Relevant Medications   sertraline (ZOLOFT) 100 MG tablet   COPD (chronic obstructive pulmonary disease) (HCC)    Stable COPD without acute flare Active smoker Continues on Spiriva daily maintenance Rarely using PRN Albuterol Follow-up - may consider treatment for smoking / referral to Pulm      Relevant Medications   tiotropium (SPIRIVA) 18 MCG inhalation capsule   Elevated hemoglobin A1c    Mild elevated A1c  5.6   Plan:  1. Not on any therapy currently  2. Encourage improved lifestyle - low carb, low sugar diet, reduce portion size, continue improving regular exercise - Handout given on glycemic diet 3. Follow-up q 6 months for now A1c trend       Tobacco use    Not ready to quit Active smoker Counseling reviewed       Other Visit Diagnoses    Annual physical exam    -  Primary   Screening for breast cancer       Relevant Orders   MM DIGITAL SCREENING BILATERAL    Updated Health Maintenance information - Ordered mammogram, patient to schedule Reviewed recent lab results with patient Encouraged improvement to lifestyle with diet and exercise - Goal quit smoking    Meds ordered this encounter  Medications  . tiotropium (SPIRIVA) 18 MCG inhalation capsule    Sig: Place 1 capsule (18 mcg total) into inhaler and inhale daily.  Dispense:  30 capsule    Refill:  11  . sertraline (ZOLOFT) 100 MG tablet    Sig: Take 1 tablet (100 mg total) by mouth daily.    Dispense:  90 tablet    Refill:  3  . hydrOXYzine (ATARAX/VISTARIL) 10 MG tablet    Sig: Take 1 tablet (10 mg total) by mouth at bedtime as needed for anxiety (sleep).    Dispense:  30 tablet    Refill:  3    Follow up plan: Return in about 6 months (around 12/25/2018) for Elevated A1c, COPD, Smoking.  Nobie Putnam, [facility] 06/24/2018, 6:21 PM

## 2018-06-24 NOTE — Progress Notes (Signed)
Subjective:   Kristin Parker is a 67 y.o. female who presents for Medicare Annual (Subsequent) preventive examination.  Review of Systems:  Cardiac Risk Factors include: hypertension;advanced age (>33men, >78 women);dyslipidemia;smoking/ tobacco exposure     Objective:     Vitals: BP 140/78 (BP Location: Left Arm, Cuff Size: Normal)   Pulse 64   Temp 98.4 F (36.9 C) (Oral)   Resp 16   Ht 5\' 7"  (1.702 m)   Wt 158 lb 12.8 oz (72 kg)   BMI 24.87 kg/m   Body mass index is 24.87 kg/m.  Advanced Directives 06/24/2018 06/25/2017 05/14/2017  Does Patient Have a Medical Advance Directive? No No No  Would patient like information on creating a medical advance directive? Yes (MAU/Ambulatory/Procedural Areas - Information given) No - Patient declined Yes (MAU/Ambulatory/Procedural Areas - Information given)    Tobacco Social History   Tobacco Use  Smoking Status Current Every Day Smoker  . Packs/day: 0.50  . Years: 30.00  . Pack years: 15.00  . Types: Cigarettes  Smokeless Tobacco Current User     Ready to quit: No Counseling given: Yes   Clinical Intake:  Pre-visit preparation completed: Yes  Pain : No/denies pain     Nutritional Status: BMI of 19-24  Normal Nutritional Risks: None Diabetes: No  How often do you need to have someone help you when you read instructions, pamphlets, or other written materials from your doctor or pharmacy?: 1 - Never What is the last grade level you completed in school?: GED  Interpreter Needed?: No  Information entered by :: Effrey Davidow,LPN   Past Medical History:  Diagnosis Date  . Anxiety   . Aortic valve sclerosis   . Bilateral carotid bruits   . Carotid artery stenosis   . Depression   . Hypertension   . Mitral regurgitation   . Mixed hyperlipidemia   . Shoulder pain   . Sleeping difficulties    Past Surgical History:  Procedure Laterality Date  . CATARACT EXTRACTION Left    2017  . COLONOSCOPY  2012   Dr Candace Cruise    . LEFT HEART CATH AND CORONARY ANGIOGRAPHY N/A 06/25/2017   Procedure: LEFT HEART CATH AND CORONARY ANGIOGRAPHY;  Surgeon: [name], MD;  Location: [place];  Service: Cardiovascular;  Laterality: N/A;   Family History  Problem Relation Age of Onset  . Cancer Mother        bladder cancer  . Heart disease Mother 82       Pacemaker  . Cancer Father        lung  . Multiple sclerosis Sister   . Valvular heart disease Brother 7       s/p bioprosthetic valve replacement at [facility]   Social History   Socioeconomic History  . Marital status: Divorced    Spouse name: Not on file  . Number of children: Not on file  . Years of education: Not on file  . Highest education level: GED or equivalent  Occupational History  . Not on file  Social Needs  . Financial resource strain: Not hard at all  . Food insecurity:    Worry: Never true    Inability: Never true  . Transportation needs:    Medical: No    Non-medical: No  Tobacco Use  . Smoking status: Current Every Day Smoker    Packs/day: 0.50    Years: 30.00    Pack years: 15.00    Types: Cigarettes  . Smokeless  tobacco: Current User  Substance and Sexual Activity  . Alcohol use: No    Alcohol/week: 0.0 standard drinks  . Drug use: No  . Sexual activity: Not on file  Lifestyle  . Physical activity:    Days per week: 0 days    Minutes per session: 0 min  . Stress: Not at all  Relationships  . Social connections:    Talks on phone: More than three times a week    Gets together: More than three times a week    Attends religious service: Never    Active member of club or organization: No    Attends meetings of clubs or organizations: Never    Relationship status: Divorced  Other Topics Concern  . Not on file  Social History Narrative  . Not on file    Outpatient Encounter Medications as of 06/24/2018  Medication Sig  . albuterol (PROVENTIL HFA;VENTOLIN HFA) 108 (90 Base) MCG/ACT inhaler INHALE 2 PUFFS  INTO THE LUNGS EVERY 4 HOURS AS NEEDED FOR WHEEZING OR SHORTNESS OF BREATH OR COUGH  . amLODipine (NORVASC) 5 MG tablet TAKE 1 TABLET(5 MG) BY MOUTH DAILY  . aspirin EC 81 MG tablet Take 1 tablet (81 mg total) by mouth daily.  . cetirizine (ZYRTEC) 10 MG tablet Take 10 mg by mouth daily.  . fluticasone (FLONASE) 50 MCG/ACT nasal spray Place 2 sprays into both nostrils daily.  . isosorbide mononitrate (IMDUR) 30 MG 24 hr tablet TAKE 1 TABLET(30 MG) BY MOUTH DAILY  . losartan (COZAAR) 100 MG tablet Take 1 tablet (100 mg total) by mouth daily.  . meloxicam (MOBIC) 15 MG tablet Take 1 tablet (15 mg total) by mouth daily as needed for pain. May take for 1-2 weeks at a time then as needed in future  . Omega-3 Fatty Acids (FISH OIL) 1000 MG CAPS Take 1 capsule (1,000 mg total) by mouth 2 (two) times daily.  . rosuvastatin (CRESTOR) 40 MG tablet Take 1 tablet (40 mg total) by mouth daily.  . sertraline (ZOLOFT) 100 MG tablet Take 1 tablet (100 mg total) by mouth daily.  Marland Kitchen tiotropium (SPIRIVA) 18 MCG inhalation capsule Place 1 capsule (18 mcg total) into inhaler and inhale daily.  . benzonatate (TESSALON) 100 MG capsule TAKE 1 CAPSULE(100 MG) BY MOUTH THREE TIMES DAILY AS NEEDED FOR COUGH (Patient not taking: Reported on 06/24/2018)  . hydrochlorothiazide (HYDRODIURIL) 12.5 MG tablet   . hydrOXYzine (ATARAX/VISTARIL) 10 MG tablet Take 1 tablet (10 mg total) by mouth 3 (three) times daily as needed for itching. (Patient not taking: Reported on 06/24/2018)  . mometasone (ELOCON) 0.1 % cream APP AA ON THE SKIN BID  . mupirocin ointment (BACTROBAN) 2 % APPLY TOPICALLY ON THE SKIN DAILY PRN  . triamcinolone cream (KENALOG) 0.5 % Apply 1 application topically 2 (two) times daily. To affected areas, for up to 2 weeks. Do not use on face. (Patient not taking: Reported on 06/24/2018)   No facility-administered encounter medications on file as of 06/24/2018.     Activities of Daily Living In your present state of  health, do you have any difficulty performing the following activities: 06/24/2018  Hearing? N  Vision? N  Difficulty concentrating or making decisions? N  Walking or climbing stairs? N  Dressing or bathing? N  Doing errands, shopping? N  Preparing Food and eating ? N  Using the Toilet? N  In the past six months, have you accidently leaked urine? N  Do you have problems with loss  of bowel control? N  Managing your Medications? N  Managing your Finances? N  Housekeeping or managing your Housekeeping? N  Some recent data might be hidden    Patient Care Team: Olin Hauser, DO as PCP - General (Family Medicine) Christene Lye, MD (General Surgery)    Assessment:   This is a routine wellness examination for [place].  Exercise Activities and Dietary recommendations Current Exercise Habits: The patient does not participate in regular exercise at present, Exercise limited by: None identified  Goals    . Quit smoking / using tobacco     Smoking cessation discussed       Fall Risk Fall Risk  06/24/2018 05/30/2018 05/14/2017 12/18/2016 09/18/2016  Falls in the past year? No No No No No   Is the patient's home free of loose throw rugs in walkways, pet beds, electrical cords, etc?   no      Grab bars in the bathroom? no      Handrails on the stairs?   no      Adequate lighting?   yes  Timed Get Up and Go performed: Completed in 8 seconds with no use of assistive devices, steady gait. No intervention needed at this time.   Depression Screen PHQ 2/9 Scores 06/24/2018 05/30/2018 09/23/2017 05/14/2017  PHQ - 2 Score 0 0 0 0  PHQ- 9 Score - - 3 -     Cognitive Function     6CIT Screen 06/24/2018 05/14/2017  What Year? 0 points 0 points  What month? 0 points 0 points  What time? 0 points 0 points  Count back from 20 0 points 0 points  Months in reverse 0 points 0 points  Repeat phrase 0 points 0 points  Total Score 0 0     There is no immunization history on file  for this patient.  Qualifies for Shingles Vaccine? Yes, discussed shingrix vaccine - declined  Screening Tests Health Maintenance  Topic Date Due  . INFLUENZA VACCINE  02/11/2019 (Originally 05/29/2018)  . MAMMOGRAM  06/25/2019 (Originally 04/03/2013)  . DEXA SCAN  06/25/2019 (Originally 04/03/2014)  . PNA vac Low Risk Adult (1 of 2 - PCV13) 06/25/2019 (Originally 04/03/2014)  . TETANUS/TDAP  03/29/2021  . COLONOSCOPY  04/04/2021  . Hepatitis C Screening  Completed   Declined all vaccine and screenings at this time.    Cancer Screenings: Lung: Low Dose CT Chest recommended if Age 61-80 years, 30 pack-year currently smoking OR have quit w/in 15years. Patient does not qualify. Breast:  Up to date on Mammogram? No declined Up to date of Bone Density/Dexa? No- declined Colorectal: completed 6/72012  Additional Screenings:  Hepatitis C Screening: completed 06/18/2018     Plan:    I have personally reviewed and addressed the Medicare Annual Wellness questionnaire and have noted the following in the patient's chart:  A. Medical and social history B. Use of alcohol, tobacco or illicit drugs  C. Current medications and supplements D. Functional ability and status E.  Nutritional status F.  Physical activity G. Advance directives H. List of other physicians I.  Hospitalizations, surgeries, and ER visits in previous 12 months J.  [place] such as hearing and vision if needed, cognitive and depression L. Referrals and appointments   In addition, I have reviewed and discussed with patient certain preventive protocols, quality metrics, and best practice recommendations. A written personalized care plan for preventive services as well as general preventive health recommendations were provided to patient.  Signed,  Tyler Aas, LPN Nurse Health Advisor   Nurse Notes:none

## 2018-06-24 NOTE — Patient Instructions (Signed)
Kristin Parker , Thank you for taking time to come for your Medicare Wellness Visit. I appreciate your ongoing commitment to your health goals. Please review the following plan we discussed and let me know if I can assist you in the future.   Screening recommendations/referrals: Colonoscopy: completed 04/05/2011 Mammogram: completed 04/04/2011- declined Bone Density: declined Recommended yearly ophthalmology/optometry visit for glaucoma screening and checkup Recommended yearly dental visit for hygiene and checkup  Vaccinations: Influenza vaccine: due 06/2018- declined Pneumococcal vaccine: due now- declined  Tdap vaccine: completed 03/30/2011 Shingles vaccine: shingrix eligible, check with your insurance company for coverage     Advanced directives: Advance directive discussed with you today. I have provided a copy for you to complete at home and have notarized. Once this is complete please bring a copy in to our office so we can scan it into your chart.  Conditions/risks identified: smoking cessation discussed.   Next appointment: Follow up in one year for your annual wellness exam.    Preventive Care 65 Years and Older, Female Preventive care refers to lifestyle choices and visits with your health care provider that can promote health and wellness. What does preventive care include?  A yearly physical exam. This is also called an annual well check.  Dental exams once or twice a year.  Routine eye exams. Ask your health care provider how often you should have your eyes checked.  Personal lifestyle choices, including:  Daily care of your teeth and gums.  Regular physical activity.  Eating a healthy diet.  Avoiding tobacco and drug use.  Limiting alcohol use.  Practicing safe sex.  Taking low-dose aspirin every day.  Taking vitamin and mineral supplements as recommended by your health care provider. What happens during an annual well check? The services and screenings done by  your health care provider during your annual well check will depend on your age, overall health, lifestyle risk factors, and family history of disease. Counseling  Your health care provider may ask you questions about your:  Alcohol use.  Tobacco use.  Drug use.  Emotional well-being.  Home and relationship well-being.  Sexual activity.  Eating habits.  History of falls.  Memory and ability to understand (cognition).  Work and work Statistician.  Reproductive health. Screening  You may have the following tests or measurements:  Height, weight, and BMI.  Blood pressure.  Lipid and cholesterol levels. These may be checked every 5 years, or more frequently if you are over 57 years old.  Skin check.  Lung cancer screening. You may have this screening every year starting at age 77 if you have a 30-pack-year history of smoking and currently smoke or have quit within the past 15 years.  Fecal occult blood test (FOBT) of the stool. You may have this test every year starting at age 75.  Flexible sigmoidoscopy or colonoscopy. You may have a sigmoidoscopy every 5 years or a colonoscopy every 10 years starting at age 53.  Hepatitis C blood test.  Hepatitis B blood test.  Sexually transmitted disease (STD) testing.  Diabetes screening. This is done by checking your blood sugar (glucose) after you have not eaten for a while (fasting). You may have this done every 1-3 years.  Bone density scan. This is done to screen for osteoporosis. You may have this done starting at age 61.  Mammogram. This may be done every 1-2 years. Talk to your health care provider about how often you should have regular mammograms. Talk with your health care  provider about your test results, treatment options, and if necessary, the need for more tests. Vaccines  Your health care provider may recommend certain vaccines, such as:  Influenza vaccine. This is recommended every year.  Tetanus,  diphtheria, and acellular pertussis (Tdap, Td) vaccine. You may need a Td booster every 10 years.  Zoster vaccine. You may need this after age 22.  Pneumococcal 13-valent conjugate (PCV13) vaccine. One dose is recommended after age 18.  Pneumococcal polysaccharide (PPSV23) vaccine. One dose is recommended after age 72. Talk to your health care provider about which screenings and vaccines you need and how often you need them. This information is not intended to replace advice given to you by your health care provider. Make sure you discuss any questions you have with your health care provider. Document Released: 11/11/2015 Document Revised: 07/04/2016 Document Reviewed: 08/16/2015 Elsevier Interactive Patient Education  2017 [place] Prevention in the Home Falls can cause injuries. They can happen to people of all ages. There are many things you can do to make your home safe and to help prevent falls. What can I do on the outside of my home?  Regularly fix the edges of walkways and driveways and fix any cracks.  Remove anything that might make you trip as you walk through a door, such as a raised step or threshold.  Trim any bushes or trees on the path to your home.  Use bright outdoor lighting.  Clear any walking paths of anything that might make someone trip, such as rocks or tools.  Regularly check to see if handrails are loose or broken. Make sure that both sides of any steps have handrails.  Any raised decks and porches should have guardrails on the edges.  Have any leaves, snow, or ice cleared regularly.  Use sand or salt on walking paths during winter.  Clean up any spills in your garage right away. This includes oil or grease spills. What can I do in the bathroom?  Use night lights.  Install grab bars by the toilet and in the tub and shower. Do not use towel bars as grab bars.  Use non-skid mats or decals in the tub or shower.  If you need to sit down in  the shower, use a plastic, non-slip stool.  Keep the floor dry. Clean up any water that spills on the floor as soon as it happens.  Remove soap buildup in the tub or shower regularly.  Attach bath mats securely with double-sided non-slip rug tape.  Do not have throw rugs and other things on the floor that can make you trip. What can I do in the bedroom?  Use night lights.  Make sure that you have a light by your bed that is easy to reach.  Do not use any sheets or blankets that are too big for your bed. They should not hang down onto the floor.  Have a firm chair that has side arms. You can use this for support while you get dressed.  Do not have throw rugs and other things on the floor that can make you trip. What can I do in the kitchen?  Clean up any spills right away.  Avoid walking on wet floors.  Keep items that you use a lot in easy-to-reach places.  If you need to reach something above you, use a strong step stool that has a grab bar.  Keep electrical cords out of the way.  Do not use floor polish  or wax that makes floors slippery. If you must use wax, use non-skid floor wax.  Do not have throw rugs and other things on the floor that can make you trip. What can I do with my stairs?  Do not leave any items on the stairs.  Make sure that there are handrails on both sides of the stairs and use them. Fix handrails that are broken or loose. Make sure that handrails are as long as the stairways.  Check any carpeting to make sure that it is firmly attached to the stairs. Fix any carpet that is loose or worn.  Avoid having throw rugs at the top or bottom of the stairs. If you do have throw rugs, attach them to the floor with carpet tape.  Make sure that you have a light switch at the top of the stairs and the bottom of the stairs. If you do not have them, ask someone to add them for you. What else can I do to help prevent falls?  Wear shoes that:  Do not have high  heels.  Have rubber bottoms.  Are comfortable and fit you well.  Are closed at the toe. Do not wear sandals.  If you use a stepladder:  Make sure that it is fully opened. Do not climb a closed stepladder.  Make sure that both sides of the stepladder are locked into place.  Ask someone to hold it for you, if possible.  Clearly mark and make sure that you can see:  Any grab bars or handrails.  First and last steps.  Where the edge of each step is.  Use tools that help you move around (mobility aids) if they are needed. These include:  Canes.  Walkers.  Scooters.  Crutches.  Turn on the lights when you go into a dark area. Replace any light bulbs as soon as they burn out.  Set up your furniture so you have a clear path. Avoid moving your furniture around.  If any of your floors are uneven, fix them.  If there are any pets around you, be aware of where they are.  Review your medicines with your doctor. Some medicines can make you feel dizzy. This can increase your chance of falling. Ask your doctor what other things that you can do to help prevent falls. This information is not intended to replace advice given to you by your health care provider. Make sure you discuss any questions you have with your health care provider. Document Released: 08/11/2009 Document Revised: 03/22/2016 Document Reviewed: 11/19/2014 Elsevier Interactive Patient Education  2017 [employer].

## 2018-06-24 NOTE — Patient Instructions (Addendum)
Thank you for coming to the office today.  1. Chemistry - Normal results, including electrolytes, kidney and liver function. Slightly elevated fasting blood sugar   2. Hemoglobin A1c (Diabetes screening) - 5.6, elevated but still normal not in range of Pre-Diabetes (>5.7 to 6.4)  - Make a couple changes as discussed for lower sugar diet  3. Routine screening Hepatitis C - Negative.  4. Cholesterol - Mostly Normal cholesterol results. Slightly low but still much improved 44 HDL (good cholesterol) - Other results much improved, normal 95 LDL (bad cholesterol), and normal 137 from >400 Triglycerides - Controlled on Rosuvastatin 40mg  and Fish Oil medication  5. CBC Blood Counts - Normal, no anemia, other abnormality  --------------------------------------  For Mammogram screening for breast cancer   Call the [facility] below anytime to schedule your own appointment now that order has been placed.  [facility] [place], [place] [address] Phone: [phone]  --------------------------------------------  If interested  Low Dose Chest CT Lung CA Screening - Age 86-74  [place] Smoking Cessation Class Ph: [phone]   Please schedule a Follow-up Appointment to: Return in about 6 months (around 12/25/2018) for Elevated A1c, COPD, Smoking.  If you have any other questions or concerns, please feel free to call the office or send a message through [place]. You may also schedule an earlier appointment if necessary.  Additionally, you may be receiving a survey about your experience at our office within a few days to 1 week by e-mail or mail. We value your feedback.  Nobie Putnam, DO [place]

## 2018-06-25 ENCOUNTER — Other Ambulatory Visit: Payer: Self-pay | Admitting: Orthopedic Surgery

## 2018-06-25 DIAGNOSIS — M19011 Primary osteoarthritis, right shoulder: Secondary | ICD-10-CM

## 2018-06-25 DIAGNOSIS — G8929 Other chronic pain: Secondary | ICD-10-CM | POA: Diagnosis not present

## 2018-06-25 DIAGNOSIS — M25511 Pain in right shoulder: Secondary | ICD-10-CM | POA: Diagnosis not present

## 2018-06-25 NOTE — Assessment & Plan Note (Signed)
Not ready to quit Active smoker Counseling reviewed

## 2018-06-25 NOTE — Assessment & Plan Note (Signed)
Mild elevated A1c 5.6   Plan:  1. Not on any therapy currently  2. Encourage improved lifestyle - low carb, low sugar diet, reduce portion size, continue improving regular exercise - Handout given on glycemic diet 3. Follow-up q 6 months for now A1c trend

## 2018-06-25 NOTE — Assessment & Plan Note (Signed)
Likely chronic rotator cuff or other pathology known DJD within R shoulder Follow-up as planned with Orthopedics, given limited benefit from injection 1 month ago

## 2018-06-25 NOTE — Assessment & Plan Note (Addendum)
Stable, controlled Without panic or other flare Continues on SSRI nightly, Sertraline Add Hydroxyzine 10mg  nightly PRN for insomnia and anxiety

## 2018-06-25 NOTE — Assessment & Plan Note (Signed)
Stable COPD without acute flare Active smoker Continues on Spiriva daily maintenance Rarely using PRN Albuterol Follow-up - may consider treatment for smoking / referral to [facility]

## 2018-07-15 ENCOUNTER — Ambulatory Visit
Admission: RE | Admit: 2018-07-15 | Discharge: 2018-07-15 | Disposition: A | Discharge status: Home / Self Care | Payer: Medicare HMO | Source: Ambulatory Visit | Attending: Orthopedic Surgery | Admitting: Orthopedic Surgery

## 2018-07-15 DIAGNOSIS — M19011 Primary osteoarthritis, right shoulder: Secondary | ICD-10-CM | POA: Diagnosis not present

## 2018-07-15 DIAGNOSIS — M25511 Pain in right shoulder: Secondary | ICD-10-CM | POA: Diagnosis not present

## 2018-07-29 IMAGING — CR DG CHEST 2V
2 series · 2 of 2 positions shown · non-contrast
Comparison: None

CLINICAL DATA: Chest pain for 1 month.

EXAM:
CHEST  2 VIEW

[chest pa]
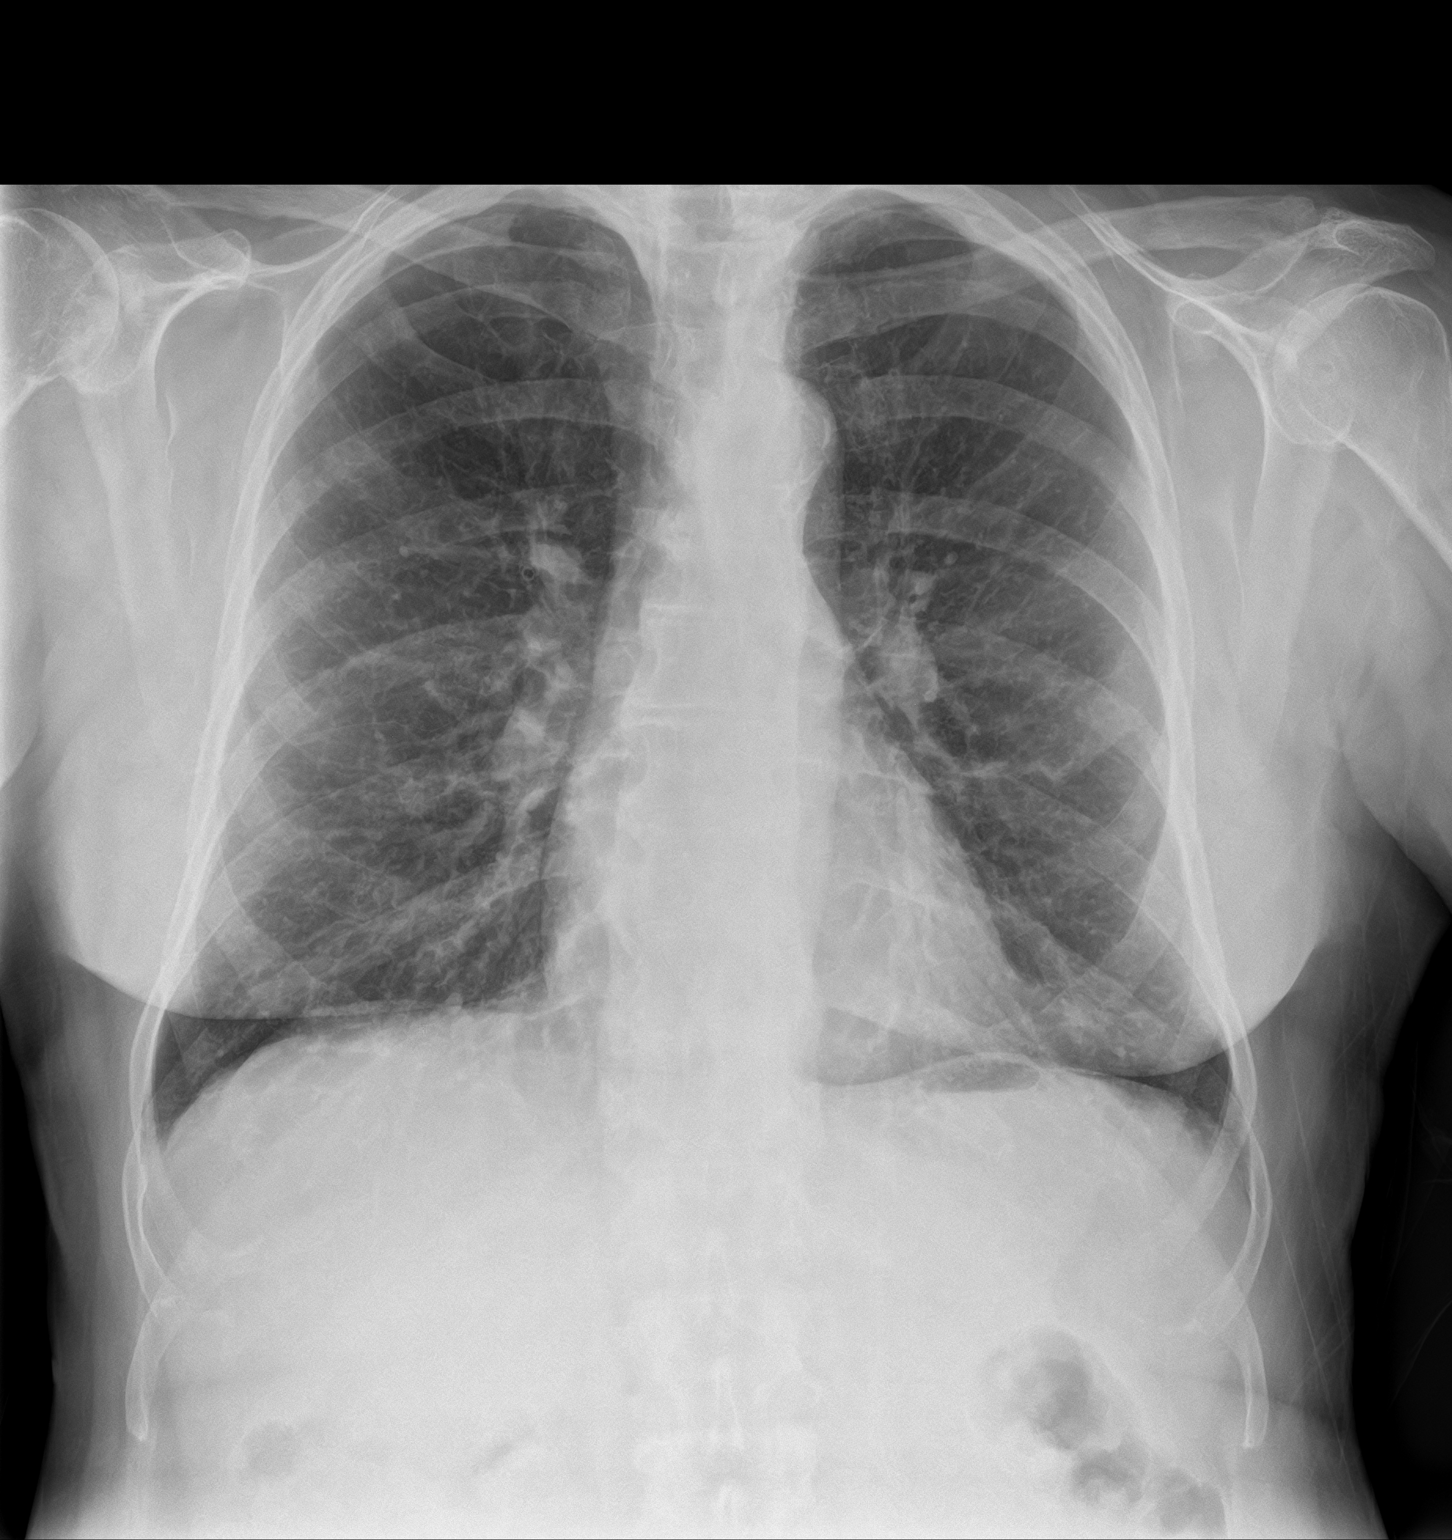

[chest lat]
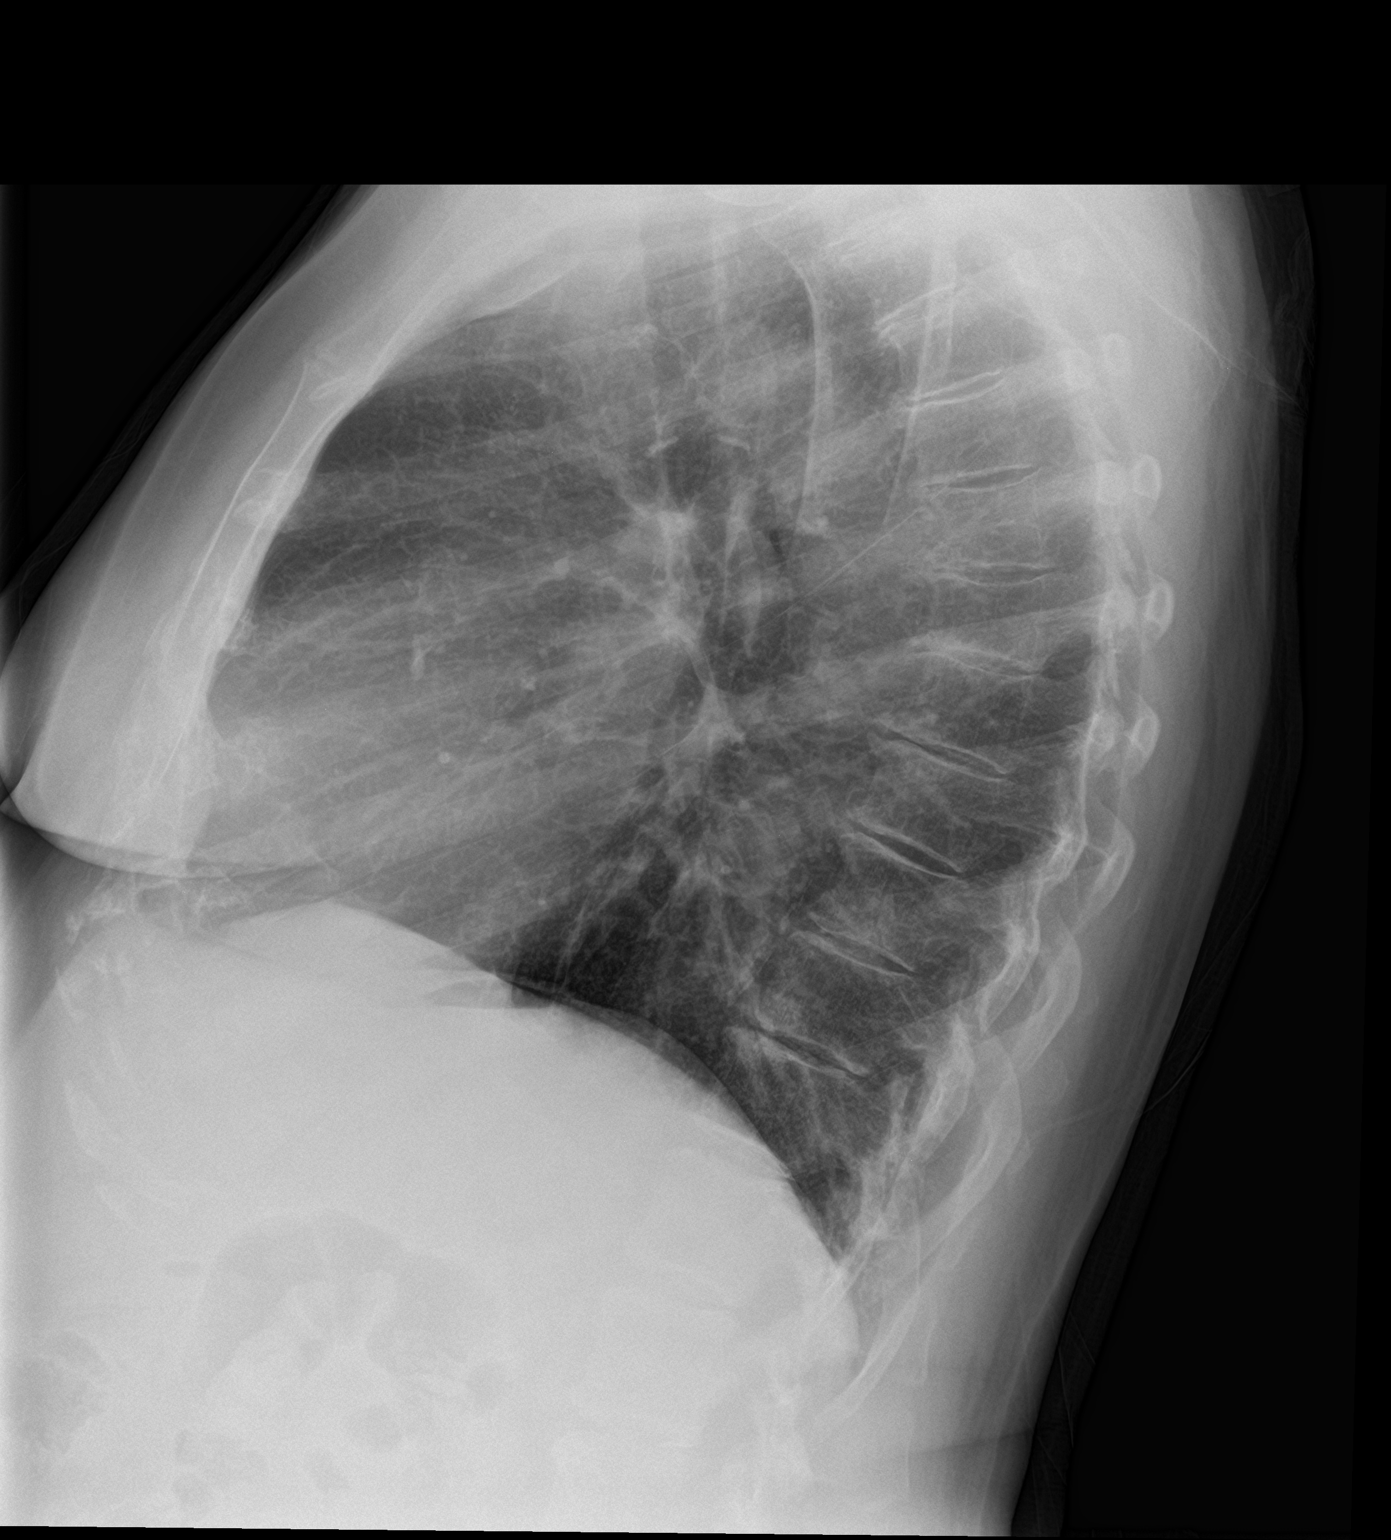

[2 of 2 positions shown; findings below may reference images not displayed]

FINDINGS: The cardiomediastinal silhouette is unremarkable.

There is no evidence of focal airspace disease, pulmonary edema,
suspicious pulmonary nodule/mass, pleural effusion, or pneumothorax.
No acute bony abnormalities are identified.
IMPRESSION: No active cardiopulmonary disease.

## 2018-07-30 ENCOUNTER — Ambulatory Visit
Admission: RE | Admit: 2018-07-30 | Discharge: 2018-07-30 | Disposition: A | Discharge status: Home / Self Care | Payer: Medicare HMO | Source: Ambulatory Visit | Attending: Family Medicine | Admitting: Family Medicine

## 2018-07-30 DIAGNOSIS — Z1231 Encounter for screening mammogram for malignant neoplasm of breast: Secondary | ICD-10-CM | POA: Insufficient documentation

## 2018-07-30 DIAGNOSIS — Z1239 Encounter for other screening for malignant neoplasm of breast: Secondary | ICD-10-CM

## 2018-07-31 ENCOUNTER — Other Ambulatory Visit: Payer: Self-pay | Admitting: Family Medicine

## 2018-07-31 DIAGNOSIS — R928 Other abnormal and inconclusive findings on diagnostic imaging of breast: Secondary | ICD-10-CM

## 2018-07-31 DIAGNOSIS — R921 Mammographic calcification found on diagnostic imaging of breast: Secondary | ICD-10-CM

## 2018-07-31 DIAGNOSIS — I10 Essential (primary) hypertension: Secondary | ICD-10-CM

## 2018-08-06 ENCOUNTER — Ambulatory Visit
Admission: RE | Admit: 2018-08-06 | Discharge: 2018-08-06 | Disposition: A | Discharge status: Home / Self Care | Payer: Medicare HMO | Source: Ambulatory Visit | Attending: Family Medicine | Admitting: Family Medicine

## 2018-08-06 ENCOUNTER — Other Ambulatory Visit: Payer: Self-pay | Admitting: Family Medicine

## 2018-08-06 DIAGNOSIS — R921 Mammographic calcification found on diagnostic imaging of breast: Secondary | ICD-10-CM

## 2018-08-06 DIAGNOSIS — R928 Other abnormal and inconclusive findings on diagnostic imaging of breast: Secondary | ICD-10-CM

## 2018-08-13 ENCOUNTER — Ambulatory Visit
Admission: RE | Admit: 2018-08-13 | Discharge: 2018-08-13 | Disposition: A | Discharge status: Home / Self Care | Payer: Medicare HMO | Source: Ambulatory Visit | Attending: Family Medicine | Admitting: Family Medicine

## 2018-08-13 DIAGNOSIS — R928 Other abnormal and inconclusive findings on diagnostic imaging of breast: Secondary | ICD-10-CM | POA: Diagnosis not present

## 2018-08-13 DIAGNOSIS — R921 Mammographic calcification found on diagnostic imaging of breast: Secondary | ICD-10-CM | POA: Insufficient documentation

## 2018-08-13 DIAGNOSIS — N6022 Fibroadenosis of left breast: Secondary | ICD-10-CM | POA: Diagnosis not present

## 2018-08-13 DIAGNOSIS — R920 Mammographic microcalcification found on diagnostic imaging of breast: Secondary | ICD-10-CM | POA: Diagnosis not present

## 2018-08-13 HISTORY — PX: BREAST BIOPSY: SHX20

## 2018-08-15 LAB — SURGICAL PATHOLOGY

## 2018-08-16 ENCOUNTER — Other Ambulatory Visit: Payer: Self-pay | Admitting: Family Medicine

## 2018-08-16 ENCOUNTER — Other Ambulatory Visit: Payer: Self-pay | Admitting: Internal Medicine

## 2018-08-16 DIAGNOSIS — I10 Essential (primary) hypertension: Secondary | ICD-10-CM

## 2018-08-18 ENCOUNTER — Other Ambulatory Visit: Payer: Self-pay | Admitting: Internal Medicine

## 2018-09-03 ENCOUNTER — Ambulatory Visit (INDEPENDENT_AMBULATORY_CARE_PROVIDER_SITE_OTHER): Payer: Medicare HMO | Admitting: Internal Medicine

## 2018-09-03 ENCOUNTER — Encounter: Payer: Self-pay | Admitting: Internal Medicine

## 2018-09-03 VITALS — BP 144/64 | HR 80 | Ht 67.0 in | Wt 161.0 lb

## 2018-09-03 DIAGNOSIS — I25118 Atherosclerotic heart disease of native coronary artery with other forms of angina pectoris: Secondary | ICD-10-CM | POA: Diagnosis not present

## 2018-09-03 DIAGNOSIS — I10 Essential (primary) hypertension: Secondary | ICD-10-CM

## 2018-09-03 DIAGNOSIS — J449 Chronic obstructive pulmonary disease, unspecified: Secondary | ICD-10-CM | POA: Diagnosis not present

## 2018-09-03 DIAGNOSIS — E782 Mixed hyperlipidemia: Secondary | ICD-10-CM

## 2018-09-03 MED ORDER — ISOSORBIDE MONONITRATE ER 60 MG PO TB24: 60.0000 mg | ORAL_TABLET | Freq: Every day | ORAL | 3 refills | Status: DC

## 2018-09-03 MED ORDER — EZETIMIBE 10 MG PO TABS: 10.0000 mg | ORAL_TABLET | Freq: Every day | ORAL | 3 refills | Status: DC

## 2018-09-03 NOTE — Patient Instructions (Signed)
Medication Instructions:  Your physician has recommended you make the following change in your medication:  1- START Zetia 10 mg (1 tablet) by mouth once a day. 2- INCREASE Imdur to 60 mg by mouth once a day.  If you need a refill on your cardiac medications before your next appointment, please call your pharmacy.   Lab work: Your physician recommends that you return for lab work in: 3 MONTHS (APPROX. February 2020) FOR LIPID PANEL. - You will need to be FASTING. - Please go to the [facility]. You will check in at the front desk to the right as you walk into the atrium. Valet Parking is offered if needed.   If you have labs (blood work) drawn today and your tests are completely normal, you will receive your results only by: Marland Kitchen MyChart Message (if you have MyChart) OR . A paper copy in the mail If you have any lab test that is abnormal or we need to change your treatment, we will call you to review the results.  Testing/Procedures: none  Follow-Up: At [facility], you and your health needs are our priority.  As part of our continuing mission to provide you with exceptional heart care, we have created designated Provider Care Teams.  These Care Teams include your primary Cardiologist (physician) and Advanced Practice Providers (APPs -  Physician Assistants and Nurse Practitioners) who all work together to provide you with the care you need, when you need it. You will need a follow up appointment in 4 months.  Please call our office 2 months in advance to schedule this appointment.  You may see DR Harrell Gave END or one of the following Advanced Practice Providers on your designated Care Team:   Murray Hodgkins, NP Christell Faith, PA-C . Marrianne Mood, PA-C

## 2018-09-03 NOTE — Progress Notes (Signed)
Follow-up Outpatient Visit Date: 09/03/2018  Primary Care Provider: Olin Hauser, DO [address] [address]  Chief Complaint: Shortness of breath  HPI:  Kristin Parker is a 67 y.o. year-old female with history of coronary artery disease (up to 40% distal LMCA by cath in 05/2017), carotid artery stenosis, hypertension, hyperlipidemia, depression, and anxiety, who presents for follow-up of fatigue.  I last saw her in December, at which time she reported feeling better with less weakness and muscle aches after switching from simvastatin to rosuvastatin.  Chronic intermittent dyspnea on exertion was unchanged.  Today, Kristin Parker reports that her intermittent dyspnea is unchanged.  It is most pronounced with activity.  She has not had any chest pain or palpitations.  She notes rare episodes of orthostatic lightheadedness.  She has not passed out or fallen.  She was able to stop smoking for about 3 months but started back in May.  She is currently smoking about half a pack per day.  She notes that her blood pressure is somewhat variable but typically in the 130s/60s range.  --------------------------------------------------------------------------------------------------  Cardiovascular History & Procedures: Cardiovascular Problems:  Aortic sclerosis  Carotid artery stenosis (mild to moderate)  Risk Factors:  Cerebrovascular disease, hypertension, hyperlipidemia, tobacco use, and age greater than 47  Cath/PCI:  LHC (06/25/17): Mild to moderate CAD, including 40% distal LMCA, 20% ostial LAD, 20-30% mid LAD, and sequential 30% proximal and mid LCx stenoses.  Small, nondominant RCA without significant disease.  Normal left ventricular filling pressure.  CV Surgery:  None  EP Procedures and Devices:  None  Non-Invasive Evaluation(s):  ETT (06/18/17): Abnormal exercise tolerance test, the target heart rate not achieved.  1-2 mm horizontal ST depressions noted in  inferior leads.  Patient only achieved 84% MPHR due to shortness of breath and fatigue.  Carotid artery Duplex (03/01/17): Smooth plaque, bilaterally. 40-59% RICA stenosis. 4-17% LICA stenosis. Patent vertebral arteries with antegrade flow. Normal subclavian arteries, bilaterally.  TTE (03/01/17): Normal LV size and function with LVEF 60-65% and grade 1 diastolic dysfunction. Mild MR. Normal RV size and function. Normal PA pressure.  Recent CV Pertinent Labs: Lab Results  Component Value Date   CHOL 163 06/18/2018   HDL 44 (L) 06/18/2018   LDLCALC 95 06/18/2018   LDLDIRECT 62 05/28/2017   TRIG 137 06/18/2018   CHOLHDL 3.7 06/18/2018   INR 0.94 06/24/2017   BNP 53.0 01/27/2017   K 4.0 06/18/2018   BUN 12 06/18/2018   CREATININE 0.91 06/18/2018    Past medical and surgical history were reviewed and updated in EPIC.  Current Meds  Medication Sig  . albuterol (PROVENTIL HFA;VENTOLIN HFA) 108 (90 Base) MCG/ACT inhaler INHALE 2 PUFFS INTO THE LUNGS EVERY 4 HOURS AS NEEDED FOR WHEEZING OR SHORTNESS OF BREATH OR COUGH  . amLODipine (NORVASC) 5 MG tablet TAKE 1 TABLET(5 MG) BY MOUTH DAILY  . aspirin EC 81 MG tablet Take 1 tablet (81 mg total) by mouth daily.  . cetirizine (ZYRTEC) 10 MG tablet Take 10 mg by mouth daily.  . fluticasone (FLONASE) 50 MCG/ACT nasal spray Place 2 sprays into both nostrils daily.  . hydrOXYzine (ATARAX/VISTARIL) 10 MG tablet Take 1 tablet (10 mg total) by mouth at bedtime as needed for anxiety (sleep).  . isosorbide mononitrate (IMDUR) 30 MG 24 hr tablet TAKE 1 TABLET(30 MG) BY MOUTH DAILY  . losartan (COZAAR) 100 MG tablet TAKE 1 TABLET BY MOUTH DAILY  . meloxicam (MOBIC) 15 MG tablet Take 1  tablet (15 mg total) by mouth daily as needed for pain. May take for 1-2 weeks at a time then as needed in future  . Omega-3 Fatty Acids (FISH OIL) 1000 MG CAPS Take 1 capsule (1,000 mg total) by mouth 2 (two) times daily.  . rosuvastatin (CRESTOR) 40 MG tablet Take 1 tablet  (40 mg total) by mouth daily.  . sertraline (ZOLOFT) 100 MG tablet Take 1 tablet (100 mg total) by mouth daily.  Marland Kitchen tiotropium (SPIRIVA) 18 MCG inhalation capsule Place 1 capsule (18 mcg total) into inhaler and inhale daily.    Allergies: Aleve [naproxen sodium]  Social History   Tobacco Use  . Smoking status: Current Every Day Smoker    Packs/day: 0.50    Years: 30.00    Pack years: 15.00    Types: Cigarettes  . Smokeless tobacco: Current User  Substance Use Topics  . Alcohol use: No    Alcohol/week: 0.0 standard drinks  . Drug use: No    Family History  Problem Relation Age of Onset  . Cancer Mother        bladder cancer  . Heart disease Mother 70       Pacemaker  . Cancer Father        lung  . Multiple sclerosis Sister   . Valvular heart disease Brother 70       s/p bioprosthetic valve replacement at [facility]  . Breast cancer Neg Hx     Review of Systems: A 12-system review of systems was performed and was negative except as noted in the HPI.  --------------------------------------------------------------------------------------------------  Physical Exam: BP (!) 144/64 (BP Location: Left Arm, Patient Position: Sitting, Cuff Size: Normal)   Pulse 80   Ht 5\' 7"  (1.702 m)   Wt 161 lb (73 kg)   BMI 25.22 kg/m   General: NAD. HEENT: No conjunctival pallor or scleral icterus. Moist mucous membranes.  OP clear. Neck: Supple without lymphadenopathy, thyromegaly, JVD, or HJR. Lungs: Normal work of breathing. Clear to auscultation bilaterally without wheezes or crackles. Heart: Regular rate and rhythm without murmurs, rubs, or gallops. Non-displaced PMI. Abd: Bowel sounds present. Soft, NT/ND without hepatosplenomegaly Ext: No lower extremity edema. Skin: Warm and dry without rash.  EKG: Normal sinus rhythm without significant abnormality.  Lab Results  Component Value Date   WBC 8.0 06/18/2018   HGB 13.8 06/18/2018   HCT 41.2 06/18/2018   MCV 91.6 06/18/2018    PLT 236 06/18/2018    Lab Results  Component Value Date   NA 141 06/18/2018   K 4.0 06/18/2018   CL 104 06/18/2018   CO2 28 06/18/2018   BUN 12 06/18/2018   CREATININE 0.91 06/18/2018   GLUCOSE 102 (H) 06/18/2018   ALT 11 06/18/2018    Lab Results  Component Value Date   CHOL 163 06/18/2018   HDL 44 (L) 06/18/2018   LDLCALC 95 06/18/2018   LDLDIRECT 62 05/28/2017   TRIG 137 06/18/2018   CHOLHDL 3.7 06/18/2018    --------------------------------------------------------------------------------------------------  ASSESSMENT AND PLAN: Coronary artery disease with stable angina No new symptoms.  Chronic exertional dyspnea is stable and likely multifactorial.  We have agreed to increase isosorbide mononitrate to 60 mg daily to see if this helps improve some of her dyspnea.  We will continue current doses of amlodipine, aspirin, and rosuvastatin.  Hypertension Blood pressure mildly elevated.  As above, we will increase isosorbide mononitrate and continue amlodipine 5 mg daily and losartan 100 mg daily.  Hyperlipidemia LDL suboptimally  controlled on last check in August (goal less than 70) despite being on rosuvastatin 40 mg daily.  We have discussed lifestyle modifications and have also agreed to add ezetimibe 10 mg daily.  We will check a fasting lipid panel in about 3 months.  COPD Likely contributing to underlying shortness of breath.  I encouraged Kristin Parker to stop smoking again.  Follow-up: Return to clinic in 4 months.  [name], MD 09/03/2018 4:15 PM

## 2018-09-04 ENCOUNTER — Encounter: Payer: Self-pay | Admitting: Internal Medicine

## 2018-09-24 ENCOUNTER — Other Ambulatory Visit: Payer: Self-pay | Admitting: Internal Medicine

## 2018-11-26 ENCOUNTER — Encounter: Payer: Self-pay | Admitting: Family Medicine

## 2018-11-26 ENCOUNTER — Ambulatory Visit (INDEPENDENT_AMBULATORY_CARE_PROVIDER_SITE_OTHER): Payer: Medicare HMO | Admitting: Family Medicine

## 2018-11-26 VITALS — BP 126/54 | HR 62 | Temp 98.7°F | Resp 16 | Ht 67.0 in | Wt 157.0 lb

## 2018-11-26 DIAGNOSIS — G8929 Other chronic pain: Secondary | ICD-10-CM | POA: Diagnosis not present

## 2018-11-26 DIAGNOSIS — N3001 Acute cystitis with hematuria: Secondary | ICD-10-CM

## 2018-11-26 DIAGNOSIS — M19011 Primary osteoarthritis, right shoulder: Secondary | ICD-10-CM | POA: Diagnosis not present

## 2018-11-26 DIAGNOSIS — M25511 Pain in right shoulder: Secondary | ICD-10-CM

## 2018-11-26 DIAGNOSIS — N390 Urinary tract infection, site not specified: Secondary | ICD-10-CM | POA: Diagnosis not present

## 2018-11-26 DIAGNOSIS — R319 Hematuria, unspecified: Secondary | ICD-10-CM | POA: Diagnosis not present

## 2018-11-26 LAB — POCT URINALYSIS DIPSTICK
Bilirubin, UA: NEGATIVE
GLUCOSE UA: NEGATIVE
KETONES UA: NEGATIVE
Nitrite, UA: NEGATIVE
Protein, UA: NEGATIVE
SPEC GRAV UA: 1.01 (ref 1.010–1.025)
Urobilinogen, UA: 0.2 E.U./dL
pH, UA: 5 (ref 5.0–8.0)

## 2018-11-26 MED ORDER — LIDOCAINE HCL (PF) 1 % IJ SOLN
4.0000 mL | Freq: Once | INTRAMUSCULAR | Status: AC
  Administered 2018-11-26: 4 mL

## 2018-11-26 MED ORDER — METHYLPREDNISOLONE ACETATE 40 MG/ML IJ SUSP
40.0000 mg | Freq: Once | INTRAMUSCULAR | Status: AC
  Administered 2018-11-26: 40 mg via INTRA_ARTICULAR

## 2018-11-26 MED ORDER — CEPHALEXIN 500 MG PO CAPS: 500.0000 mg | ORAL_CAPSULE | Freq: Three times a day (TID) | ORAL | 0 refills | Status: DC

## 2018-11-26 NOTE — Progress Notes (Addendum)
Subjective:    Patient ID: Kristin Parker, female    DOB: 01-15-1951, 68 y.o.   MRN: [id number]  Kristin Parker is a 68 y.o. female presenting on 11/26/2018 for Urinary Tract Infection (onset week OTC Azo taken but worst yesterday frequency, urgency, pressure, bladder spasm)  Patient presents for a same day appointment.  HPI   UTI Reports symptoms started within past week, and now worsening with urinary frequency, dysuria, urinary frequency. Similar to prior UTI. She feels drained and overall not doing well. She had prior UTI >3 years ago treated with Cipro. - Admits some chills when voiding at times, associated with bladder spasms and discomfort - Denies any fevers, sweats, nausea vomiting  Chronic Right Shoulder Pain / Osteoarthritis glenohumeral and Rotator Cuff/Biceps Tendinosis - Last visit with me for this problem 05/2018, has received steroid injection x 2 in past with temporary relief, and referred to [facility], saw Lerry Paterson on 8/28 ultimately had R Shoulder MRI 07/15/18, see results below it showed moderate-severe OA/DJD of shoulder and also tendinosis of biceps and supraspinatous - She states that she was waiting for these MRI results since September 2019, she states did not get a call about them and has not followed up  Admits prior injections worked temporarily then pain returned. She is interested to pursue more surgical or other intervention, and would like to return to orthopedics. She will go back to [facility] next to review MRI results  Taking NSAID meloxicam PRN, not everyday, skips days if possible, then has worse pain at times. Taking Tylenol regularly  Denies any new injury, fall, redness swelling other joint pain   Depression screen [facility] 2/9 11/26/2018 06/24/2018 06/24/2018  Decreased Interest 0 0 0  Down, Depressed, Hopeless 0 0 0  PHQ - 2 Score 0 0 0  Altered sleeping 0 0 -  Tired, decreased energy 0 0 -  Change in appetite 0 0 -  Feeling  bad or failure about yourself  0 0 -  Trouble concentrating 0 0 -  Moving slowly or fidgety/restless 0 0 -  Suicidal thoughts 0 0 -  PHQ-9 Score 0 0 -  Difficult doing work/chores Not difficult at all Not difficult at all -    Social History   Tobacco Use  . Smoking status: Current Every Day Smoker    Packs/day: 0.50    Years: 30.00    Pack years: 15.00    Types: Cigarettes  . Smokeless tobacco: Current User  Substance Use Topics  . Alcohol use: No    Alcohol/week: 0.0 standard drinks  . Drug use: No    Review of Systems Per HPI unless specifically indicated above     Objective:    BP (!) 126/54   Pulse 62   Temp 98.7 F (37.1 C) (Oral)   Resp 16   Ht 5\' 7"  (1.702 m)   Wt 157 lb (71.2 kg)   BMI 24.59 kg/m   Wt Readings from Last 3 Encounters:  11/26/18 157 lb (71.2 kg)  09/03/18 161 lb (73 kg)  06/24/18 158 lb 12.8 oz (72 kg)    Physical Exam Vitals signs and nursing note reviewed.  Constitutional:      General: She is not in acute distress.    Appearance: She is well-developed. She is not diaphoretic.     Comments: Well-appearing, comfortable, cooperative  HENT:     Head: Normocephalic and atraumatic.  Eyes:     General:  Right eye: No discharge.        Left eye: No discharge.     Conjunctiva/sclera: Conjunctivae normal.  Cardiovascular:     Rate and Rhythm: Normal rate.  Pulmonary:     Effort: Pulmonary effort is normal.  Abdominal:     General: Bowel sounds are normal. There is no distension.     Palpations: Abdomen is soft.     Tenderness: There is no abdominal tenderness.  Musculoskeletal:     Comments: Right Shoulder Tender to anterior and lateral palpation. Limited R Shoulder forward flexion, abduction < 45* and limited internal rotation due to pain.  Mild R mid to low back tenderness paraspinal muscle with some hypertonicity. No CVAT tender to palpation  Skin:    General: Skin is warm and dry.     Findings: No erythema or rash.    Neurological:     Mental Status: She is alert and oriented to person, place, and time.  Psychiatric:        Behavior: Behavior normal.     Comments: Well groomed, good eye contact, normal speech and thoughts      I have personally reviewed the radiology report from 07/15/18 on MR R Shoulder.  MR SHOULDER RIGHT WO CONTRAST [[id number]] Resulted: 07/15/18 1112  Order Status: Completed Updated: 07/15/18 1114  Narrative:   CLINICAL DATA: Right shoulder pain for years. Getting worse over the past year.  EXAM: MRI OF THE RIGHT SHOULDER WITHOUT CONTRAST  TECHNIQUE: Multiplanar, multisequence MR imaging of the shoulder was performed. No intravenous contrast was administered.  COMPARISON: None.  FINDINGS: Rotator cuff: Mild tendinosis of the supraspinatus tendon. Infraspinatus tendon is intact. Teres minor tendon is intact. Subscapularis tendon is intact.  Muscles: No atrophy or fatty replacement of nor abnormal signal within, the muscles of the rotator cuff.  Biceps long head: Moderate tendinosis of the intra-articular portion of the long head of the biceps tendon.  Acromioclavicular Joint: Moderate arthropathy of the acromioclavicular joint. Type I acromion. No subacromial/subdeltoid bursal fluid.  Glenohumeral Joint: Small joint effusion. High-grade partial-thickness cartilage loss of the glenohumeral joint with areas of full-thickness cartilage loss of the posterior glenoid with subchondral cystic changes. Areas of full-thickness cartilage loss of the posterosuperior humeral head with sub chondral reactive marrow changes. Bulky inferior humeral marginal osteophyte.  Labrum: Superior and posterior labral degeneration.  Bones: No acute osseous abnormality. No aggressive osseous lesion.  Other: No fluid collection or hematoma.  IMPRESSION: 1. Moderate-severe osteoarthritis of the right glenohumeral joint. 2. Mild tendinosis of the supraspinatus tendon. 3.  Moderate tendinosis of the intra-articular portion of the long head of the biceps tendon.   Electronically Signed By: Kathreen Devoid On: 07/15/2018 11:12   ________________________________________________________ PROCEDURE NOTE Date: 11/26/18 Right Shoulder subacromial injection Discussed benefits and risks (including pain, bleeding, infection, steroid flare). Verbal consent given by patient. Medication:  1 cc Depo-medrol 40mg  and 4 cc Lidocaine 1% without epi Time Out taken  Landmarks identified. Area cleansed with alcohol wipes. Using 21 gauge and 1, 1/2 inch needle, Right Shoulder subacromial bursa space was injected (with above listed medication) via posterior approach cold spray used for superficial anesthetic. Sterile bandage placed. Patient tolerated procedure well without bleeding or paresthesias. No complications.   Results for orders placed or performed in visit on 11/26/18  POCT Urinalysis Dipstick  Result Value Ref Range   Color, UA amber    Clarity, UA clody    Glucose, UA Negative Negative   Bilirubin, UA Negative  Ketones, UA Negative    Spec Grav, UA 1.010 1.010 - 1.025   Blood, UA large    pH, UA 5.0 5.0 - 8.0   Protein, UA Negative Negative   Urobilinogen, UA 0.2 0.2 or 1.0 E.U./dL   Nitrite, UA Negative    Leukocytes, UA Moderate (2+) (A) Negative   Appearance cloudy    Odor none       Assessment & Plan:   Problem List Items Addressed This Visit    Chronic right shoulder pain   Relevant Medications   lidocaine (PF) (XYLOCAINE) 1 % injection 4 mL   methylPREDNISolone acetate (DEPO-MEDROL) injection 40 mg   Osteoarthritis of glenohumeral joint, right   Relevant Medications   lidocaine (PF) (XYLOCAINE) 1 % injection 4 mL   methylPREDNISolone acetate (DEPO-MEDROL) injection 40 mg  Persistent Chronic R Shoulder Pain, due to OA/DJD / rotator cuff tendinopathy Followed by Marvell Fuller, last MRI 06/2018 - now reviewed results w/ patient today months  later Prior improve temporary in injection  Plan Performed R subacromial injection today, see procedure note for details, tolerated well - advised this is temporary only, limit future injections - She should return to [facility] next, review results and discuss surgical intervention possibilities - Continue NSAID intermittent - if she changes mind and is interested in other 2nd opinion orthopedics, let us know and we can help refer     Other Visit Diagnoses    Acute cystitis with hematuria    -  Primary  Clinically consistent with UTI and confirmed on UA. No recent UTIs or abx courses. trol), other complications or chronic UTI. No concern for pyelo today (no systemic symptoms, neg fever, nausea vomiting) - has some back pain  Plan: 1. Urinalysis consistent with UTI 2. Ordered Urine culture 3. Start Keflex 500mg  TID x 7 days - Stop AZO 4. Improve PO hydration 5. RTC if no improvement 1-2 weeks, red flags given to return sooner    Relevant Medications   cephALEXin (KEFLEX) 500 MG capsule   Other Relevant Orders   POCT Urinalysis Dipstick (Completed)   Urine Culture      Meds ordered this encounter  Medications  . lidocaine (PF) (XYLOCAINE) 1 % injection 4 mL  . methylPREDNISolone acetate (DEPO-MEDROL) injection 40 mg  . cephALEXin (KEFLEX) 500 MG capsule    Sig: Take 1 capsule (500 mg total) by mouth 3 (three) times daily. For 7 days    Dispense:  21 capsule    Refill:  0    Follow up plan: Return in about 4 weeks (around 12/24/2018), or if symptoms worsen or fail to improve, for shoulder pain.   Nobie Putnam, [facility] 11/26/2018, 1:41 PM

## 2018-11-26 NOTE — Patient Instructions (Addendum)
Thank you for coming to the office today.  Stop AZO  1. You have a Urinary Tract Infection - this is very common, your symptoms are reassuring and you should get better within 1 week on the antibiotics - Start Keflex 500mg  3 times daily for next 7 days, complete entire course, even if feeling better - We sent urine for a culture, we will call you within next few days if we need to change antibiotics - Please drink plenty of fluids, improve hydration over next 1 week  If symptoms worsening, developing nausea / vomiting, worsening back pain, fevers / chills / sweats, then please return for re-evaluation sooner.  To prevent bladder and kidney infections...  Drink enough water to keep your pee clear to pale yellow   If you think you are getting another bladder infection, start drinking cranberry juice and come see [place] so we can check the urine. ------------------------------------  You received a Right Shoulder Joint steroid injection today. - Lidocaine numbing medicine may ease the pain initially for a few hours until it wears off - As discussed, you may experience a "steroid flare" this evening or within 24-48 hours, anytime medicine is injected into an inflamed joint it can cause the pain to get worse temporarily - Everyone responds differently to these injections, it depends on the patient and the severity of the joint problem, it may provide anywhere from days to weeks, to months of relief. Ideal response is >6 months relief - Try to take it easy for next 1-2 days, avoid over activity and strain on joint (limit lifting for shoulder) - Recommend the following:   - For swelling - rest, compression sleeve / ACE wrap, elevation, and ice packs as needed for first few days   - For pain in future may use heating pad or moist heat as needed  ---------------  [facility]  [place], [place] [address]  [phone]  Lauris Poag, MD  [address]   [facility] [place], [place] [address]  [phone]  [phone] ([address]    [address], [place],  [address]  [phone]      Please schedule a Follow-up Appointment to: Return in about 4 weeks (around 12/24/2018), or if symptoms worsen or fail to improve, for shoulder pain.  If you have any other questions or concerns, please feel free to call the office or send a message through [place]. You may also schedule an earlier appointment if necessary.  Additionally, you may be receiving a survey about your experience at our office within a few days to 1 week by e-mail or mail. We value your feedback.  Nobie Putnam, DO [place]

## 2018-11-28 LAB — URINE CULTURE
MICRO NUMBER: 122212
SPECIMEN QUALITY: ADEQUATE

## 2018-12-01 ENCOUNTER — Telehealth: Payer: Self-pay | Admitting: Family Medicine

## 2018-12-01 DIAGNOSIS — N3001 Acute cystitis with hematuria: Secondary | ICD-10-CM

## 2018-12-01 DIAGNOSIS — B9629 Other Escherichia coli [E. coli] as the cause of diseases classified elsewhere: Secondary | ICD-10-CM

## 2018-12-01 DIAGNOSIS — N390 Urinary tract infection, site not specified: Principal | ICD-10-CM

## 2018-12-01 DIAGNOSIS — Z1612 Extended spectrum beta lactamase (ESBL) resistance: Principal | ICD-10-CM

## 2018-12-01 DIAGNOSIS — N3000 Acute cystitis without hematuria: Secondary | ICD-10-CM

## 2018-12-01 MED ORDER — NITROFURANTOIN MONOHYD MACRO 100 MG PO CAPS: 100.0000 mg | ORAL_CAPSULE | Freq: Two times a day (BID) | ORAL | 0 refills | Status: DC

## 2018-12-01 NOTE — Telephone Encounter (Signed)
Please contact patient to review the following (No MyChart Access):  Last seen on 1/29 for UTI - treated with Keflex, result of urine culture now shows ESBL E Coli - this means it is highly resistant bacteria.  It is resistant to the Keflex. This is not a good option. She should STOP taking Keflex.  We will need to switch her antibiotic now to Macrobid 100mg , take one tablet twice a day for 7 days. Sent new rx to her pharmacy.  If she is not improving then she will need to follow-up with [place] within 1 week.  Nobie Putnam, DO [facility] 12/01/2018, 9:20 AM

## 2018-12-19 ENCOUNTER — Ambulatory Visit (INDEPENDENT_AMBULATORY_CARE_PROVIDER_SITE_OTHER): Payer: Medicare HMO | Admitting: Family Medicine

## 2018-12-19 ENCOUNTER — Encounter: Payer: Self-pay | Admitting: Family Medicine

## 2018-12-19 VITALS — BP 124/55 | HR 63 | Temp 98.3°F | Resp 16 | Ht 67.0 in | Wt 157.0 lb

## 2018-12-19 DIAGNOSIS — B9629 Other Escherichia coli [E. coli] as the cause of diseases classified elsewhere: Secondary | ICD-10-CM

## 2018-12-19 DIAGNOSIS — N390 Urinary tract infection, site not specified: Secondary | ICD-10-CM

## 2018-12-19 DIAGNOSIS — Z1612 Extended spectrum beta lactamase (ESBL) resistance: Secondary | ICD-10-CM

## 2018-12-19 DIAGNOSIS — B379 Candidiasis, unspecified: Secondary | ICD-10-CM

## 2018-12-19 DIAGNOSIS — T3695XA Adverse effect of unspecified systemic antibiotic, initial encounter: Secondary | ICD-10-CM

## 2018-12-19 LAB — POCT URINALYSIS DIPSTICK
Bilirubin, UA: NEGATIVE
GLUCOSE UA: NEGATIVE
Ketones, UA: NEGATIVE
Nitrite, UA: NEGATIVE
Protein, UA: NEGATIVE
SPEC GRAV UA: 1.01 (ref 1.010–1.025)
Urobilinogen, UA: 0.2 E.U./dL
pH, UA: 5 (ref 5.0–8.0)

## 2018-12-19 MED ORDER — FLUCONAZOLE 150 MG PO TABS: ORAL_TABLET | ORAL | 0 refills | Status: DC

## 2018-12-19 MED ORDER — SULFAMETHOXAZOLE-TRIMETHOPRIM 800-160 MG PO TABS: 1.0000 | ORAL_TABLET | Freq: Two times a day (BID) | ORAL | 0 refills | Status: AC

## 2018-12-19 NOTE — Patient Instructions (Addendum)
Thank you for coming to the office today.  UTI did not seem to resolve, it looks like the resistance to antibiotic limited it from resolving.  We will switch now to Bactrim double strength and try this antibiotic 1 pill twice a day for 7 days, if this resolves it then you are good. If it gets better and then comes back, you can call and we can extend antibiotic by 1 more week and refer to Urologist.  After 7 days of antibiotic take your yeast pill to prevent yeast  If we need to we can refer:  [place] -[address] [place],  [place]  [address] Phone: [phone]  Please schedule a Follow-up Appointment to: Return in about 1 week (around 12/26/2018), or if symptoms worsen or fail to improve, for UTI.  If you have any other questions or concerns, please feel free to call the office or send a message through [place]. You may also schedule an earlier appointment if necessary.  Additionally, you may be receiving a survey about your experience at our office within a few days to 1 week by e-mail or mail. We value your feedback.  Nobie Putnam, DO [place]

## 2018-12-19 NOTE — Progress Notes (Signed)
Subjective:    Patient ID: Kristin Parker, female    DOB: [birth year], 68 y.o.   MRN: [id number]  Kristin Parker is a 68 y.o. female presenting on 12/19/2018 for Urinary Tract Infection (never improved from past)   HPI  Follow-up UTI / Recurrent UTI ESBL Last visit 11/26/18 for UTI initial evaluation, see note, typical UTI symptoms, treated with Keflex 500 TID x 7 days and had urine culture showed ESBL E Coli, multi resistant including keflex, was changed to [place] which was sensitive, she improved nearly resolved but never completely resolved. - Now presents with recurrent symptoms in past week, urinary frequency, dysuria,  - Associated with bladder spasms and discomfort - Denies any fevers, sweats, nausea vomiting, chills  Depression screen [facility] 2/9 12/19/2018 11/26/2018 06/24/2018  Decreased Interest 0 0 0  Down, Depressed, Hopeless 0 0 0  PHQ - 2 Score 0 0 0  Altered sleeping 0 0 0  Tired, decreased energy 0 0 0  Change in appetite 0 0 0  Feeling bad or failure about yourself  0 0 0  Trouble concentrating 0 0 0  Moving slowly or fidgety/restless 0 0 0  Suicidal thoughts 0 0 0  PHQ-9 Score 0 0 0  Difficult doing work/chores Not difficult at all Not difficult at all Not difficult at all    Social History   Tobacco Use  . Smoking status: Current Every Day Smoker    Packs/day: 0.50    Years: 30.00    Pack years: 15.00    Types: Cigarettes  . Smokeless tobacco: Current User  Substance Use Topics  . Alcohol use: No    Alcohol/week: 0.0 standard drinks  . Drug use: No    Review of Systems Per HPI unless specifically indicated above     Objective:    BP (!) 124/55   Pulse 63   Temp 98.3 F (36.8 C) (Oral)   Resp 16   Ht 5\' 7"  (1.702 m)   Wt 157 lb (71.2 kg)   BMI 24.59 kg/m   Wt Readings from Last 3 Encounters:  12/19/18 157 lb (71.2 kg)  11/26/18 157 lb (71.2 kg)  09/03/18 161 lb (73 kg)    Physical Exam Vitals signs and nursing note reviewed.    Constitutional:      General: She is not in acute distress.    Appearance: She is well-developed. She is not diaphoretic.     Comments: Well-appearing, comfortable, cooperative  HENT:     Head: Normocephalic and atraumatic.  Eyes:     General:        Right eye: No discharge.        Left eye: No discharge.     Conjunctiva/sclera: Conjunctivae normal.  Cardiovascular:     Rate and Rhythm: Normal rate.  Pulmonary:     Effort: Pulmonary effort is normal.  Skin:    General: Skin is warm and dry.     Findings: No erythema or rash.  Neurological:     Mental Status: She is alert and oriented to person, place, and time.  Psychiatric:        Behavior: Behavior normal.     Comments: Well groomed, good eye contact, normal speech and thoughts    Results for orders placed or performed in visit on 12/19/18  POCT Urinalysis Dipstick  Result Value Ref Range   Color, UA amber    Clarity, UA cloudy    Glucose, UA Negative Negative   Bilirubin, UA  negative    Ketones, UA negative    Spec Grav, UA 1.010 1.010 - 1.025   Blood, UA large    pH, UA 5.0 5.0 - 8.0   Protein, UA Negative Negative   Urobilinogen, UA 0.2 0.2 or 1.0 E.U./dL   Nitrite, UA negative    Leukocytes, UA Large (3+) (A) Negative   Appearance     Odor        Assessment & Plan:   Problem List Items Addressed This Visit    None    Visit Diagnoses    Urinary tract infection due to extended-spectrum beta lactamase (ESBL) producing Escherichia coli    -  Primary   Relevant Medications   sulfamethoxazole-trimethoprim (BACTRIM DS,SEPTRA DS) 800-160 MG tablet   fluconazole (DIFLUCAN) 150 MG tablet   Other Relevant Orders   POCT Urinalysis Dipstick (Completed)   Urine Culture   Antibiotic-induced yeast infection       Relevant Medications   sulfamethoxazole-trimethoprim (BACTRIM DS,SEPTRA DS) 800-160 MG tablet   fluconazole (DIFLUCAN) 150 MG tablet      Clinically well appearing, only cystitis symptoms, with  complicated recurrent ESBL E Coli based on last culture, incomplete resolution with macrobid previously  Plan Re-check UA, consistent with UTI Check urine culture START Bactrim-DS BID 7 days, may extend 7 more day if indicated if not resolved - Rx Diflucan if need for yeast after antibiotics Future consider refer to urology if not improving or more recurrent UTI Return precautions given when to go to hospital if not improve or worse  Meds ordered this encounter  Medications  . sulfamethoxazole-trimethoprim (BACTRIM DS,SEPTRA DS) 800-160 MG tablet    Sig: Take 1 tablet by mouth 2 (two) times daily for 7 days.    Dispense:  14 tablet    Refill:  0  . fluconazole (DIFLUCAN) 150 MG tablet    Sig: Take one tablet by mouth on Day 1. Repeat dose 2nd tablet on Day 3.    Dispense:  2 tablet    Refill:  0    Follow up plan: Return in about 1 week (around 12/26/2018), or if symptoms worsen or fail to improve, for UTI.   Kristin Putnam, DO [facility] 12/19/2018, 3:58 PM

## 2018-12-20 LAB — URINE CULTURE
MICRO NUMBER:: 226743
SPECIMEN QUALITY: ADEQUATE

## 2018-12-23 ENCOUNTER — Encounter: Payer: Self-pay | Admitting: Family Medicine

## 2018-12-27 ENCOUNTER — Other Ambulatory Visit: Payer: Self-pay | Admitting: Internal Medicine

## 2018-12-27 ENCOUNTER — Other Ambulatory Visit: Payer: Self-pay | Admitting: Family Medicine

## 2018-12-27 DIAGNOSIS — L239 Allergic contact dermatitis, unspecified cause: Secondary | ICD-10-CM

## 2018-12-29 NOTE — Progress Notes (Deleted)
Follow-up Outpatient Visit Date: 01/02/2019  Primary Care Provider: Olin Hauser, DO [address] [place] [address]  Chief Complaint: ***  HPI:  Ms. Kristin Parker is a 68 y.o. year-old female with history of coronary artery disease (up to 40% distal LMCA by cath in 05/2017), carotid artery stenosis, hypertension, hyperlipidemia, depression, and anxiety, who presents for follow-up of fatigue.  I last saw her in 08/2018, at which time she reported continued intermittent dyspnea, unchanged from prior visits.  She had not experienced any chest pain and continued to smoke 1/2 PPD.  We agreed to increase isosorbide mononitrate to 60 mg daily and also add ezetimibe to optimize LDL control.  --------------------------------------------------------------------------------------------------  Cardiovascular History & Procedures: Cardiovascular Problems:  Coronary artery disease  Carotid artery stenosis (mild to moderate)  Risk Factors:  Cerebrovascular disease, hypertension, hyperlipidemia, tobacco use, and age greater than 58  Cath/PCI:  LHC (06/25/17): Mild to moderate CAD, including 40% distal LMCA, 20% ostial LAD, 20-30% mid LAD, and sequential 30% proximal and mid LCx stenoses. Small, nondominant RCA without significant disease. Normal left ventricular filling pressure.  CV Surgery:  None  EP Procedures and Devices:  None  Non-Invasive Evaluation(s):  ETT (06/18/17): Abnormal exercise tolerance test, the target heart rate not achieved. 1-2 mm horizontal ST depressions noted in inferior leads. Patient only achieved 84% MPHR due to shortness of breath and fatigue.  Carotid artery Duplex (03/01/17): Smooth plaque, bilaterally. 40-59% RICA stenosis. 0-92% LICA stenosis. Patent vertebral arteries with antegrade flow. Normal subclavian arteries, bilaterally.  TTE (03/01/17): Normal LV size and function with LVEF 60-65% and grade 1 diastolic dysfunction. Mild MR. Normal RV size  and function. Normal PA pressure.  Recent CV Pertinent Labs: Lab Results  Component Value Date   CHOL 163 06/18/2018   HDL 44 (L) 06/18/2018   LDLCALC 95 06/18/2018   LDLDIRECT 62 05/28/2017   TRIG 137 06/18/2018   CHOLHDL 3.7 06/18/2018   INR 0.94 06/24/2017   BNP 53.0 01/27/2017   K 4.0 06/18/2018   BUN 12 06/18/2018   CREATININE 0.91 06/18/2018    Past medical and surgical history were reviewed and updated in EPIC.  No outpatient medications have been marked as taking for the 01/02/19 encounter (Appointment) with Syliva Mee, Harrell Gave, MD.    Allergies: Bactrim [sulfamethoxazole-trimethoprim] and Aleve [naproxen sodium]  Social History   Tobacco Use  . Smoking status: Current Every Day Smoker    Packs/day: 0.50    Years: 30.00    Pack years: 15.00    Types: Cigarettes  . Smokeless tobacco: Current User  Substance Use Topics  . Alcohol use: No    Alcohol/week: 0.0 standard drinks  . Drug use: No    Family History  Problem Relation Age of Onset  . Cancer Mother        bladder cancer  . Heart disease Mother 65       Pacemaker  . Cancer Father        lung  . Multiple sclerosis Sister   . Valvular heart disease Brother 73       s/p bioprosthetic valve replacement at [facility]  . Breast cancer Neg Hx     Review of Systems: A 12-system review of systems was performed and was negative except as noted in the HPI.  --------------------------------------------------------------------------------------------------  Physical Exam: There were no vitals taken for this visit.  General:  *** HEENT: No conjunctival pallor or scleral icterus. Moist mucous membranes.  OP clear. Neck: Supple without lymphadenopathy, thyromegaly,  JVD, or HJR. No carotid bruit. Lungs: Normal work of breathing. Clear to auscultation bilaterally without wheezes or crackles. Heart: Regular rate and rhythm without murmurs, rubs, or gallops. Non-displaced PMI. Abd: Bowel sounds present. Soft, NT/ND  without hepatosplenomegaly Ext: No lower extremity edema. Radial, PT, and DP pulses are 2+ bilaterally. Skin: Warm and dry without rash.  EKG:  ***  Lab Results  Component Value Date   WBC 8.0 06/18/2018   HGB 13.8 06/18/2018   HCT 41.2 06/18/2018   MCV 91.6 06/18/2018   PLT 236 06/18/2018    Lab Results  Component Value Date   NA 141 06/18/2018   K 4.0 06/18/2018   CL 104 06/18/2018   CO2 28 06/18/2018   BUN 12 06/18/2018   CREATININE 0.91 06/18/2018   GLUCOSE 102 (H) 06/18/2018   ALT 11 06/18/2018    Lab Results  Component Value Date   CHOL 163 06/18/2018   HDL 44 (L) 06/18/2018   LDLCALC 95 06/18/2018   LDLDIRECT 62 05/28/2017   TRIG 137 06/18/2018   CHOLHDL 3.7 06/18/2018    --------------------------------------------------------------------------------------------------  ASSESSMENT AND PLAN: ***  ?repeat function study.  Carotid Doppler.  [name], MD 12/29/2018 4:27 PM

## 2019-01-02 ENCOUNTER — Ambulatory Visit: Payer: Medicare HMO | Admitting: Internal Medicine

## 2019-01-21 ENCOUNTER — Other Ambulatory Visit: Payer: Self-pay | Admitting: Family Medicine

## 2019-01-21 DIAGNOSIS — J3089 Other allergic rhinitis: Secondary | ICD-10-CM

## 2019-01-29 ENCOUNTER — Other Ambulatory Visit: Payer: Self-pay | Admitting: Family Medicine

## 2019-01-29 DIAGNOSIS — I10 Essential (primary) hypertension: Secondary | ICD-10-CM

## 2019-02-12 ENCOUNTER — Other Ambulatory Visit: Payer: Self-pay | Admitting: *Deleted

## 2019-02-12 MED ORDER — ROSUVASTATIN CALCIUM 40 MG PO TABS: 40.0000 mg | ORAL_TABLET | Freq: Every day | ORAL | 0 refills | Status: DC

## 2019-02-19 ENCOUNTER — Telehealth: Payer: Self-pay | Admitting: *Deleted

## 2019-02-19 NOTE — Telephone Encounter (Signed)
Patient has appointment on 02/26/19 with Dr End. Need to switch to virtual and go over process and consent with her. No answer. Left message to call back.

## 2019-02-23 ENCOUNTER — Other Ambulatory Visit: Payer: Self-pay | Admitting: Internal Medicine

## 2019-02-25 NOTE — Telephone Encounter (Signed)
Appears patient was contacted by scheduling and appt notes updated. Closing encounter.

## 2019-02-25 NOTE — Telephone Encounter (Signed)
No answer. Left message to call back on patient's phone number and with patient's daughter, listed on DPR.

## 2019-02-26 ENCOUNTER — Telehealth (INDEPENDENT_AMBULATORY_CARE_PROVIDER_SITE_OTHER): Payer: Medicare HMO | Admitting: Internal Medicine

## 2019-02-26 ENCOUNTER — Telehealth: Payer: Self-pay | Admitting: *Deleted

## 2019-02-26 ENCOUNTER — Other Ambulatory Visit: Payer: Self-pay

## 2019-02-26 ENCOUNTER — Encounter: Payer: Self-pay | Admitting: Internal Medicine

## 2019-02-26 VITALS — BP 118/68 | HR 66 | Ht 67.0 in | Wt 156.0 lb

## 2019-02-26 DIAGNOSIS — I6523 Occlusion and stenosis of bilateral carotid arteries: Secondary | ICD-10-CM | POA: Diagnosis not present

## 2019-02-26 DIAGNOSIS — I10 Essential (primary) hypertension: Secondary | ICD-10-CM

## 2019-02-26 DIAGNOSIS — Z79899 Other long term (current) drug therapy: Secondary | ICD-10-CM

## 2019-02-26 DIAGNOSIS — F17210 Nicotine dependence, cigarettes, uncomplicated: Secondary | ICD-10-CM

## 2019-02-26 DIAGNOSIS — E782 Mixed hyperlipidemia: Secondary | ICD-10-CM | POA: Diagnosis not present

## 2019-02-26 DIAGNOSIS — I25118 Atherosclerotic heart disease of native coronary artery with other forms of angina pectoris: Secondary | ICD-10-CM

## 2019-02-26 DIAGNOSIS — Z720 Tobacco use: Secondary | ICD-10-CM

## 2019-02-26 MED ORDER — VARENICLINE TARTRATE 1 MG PO TABS: 1.0000 mg | ORAL_TABLET | Freq: Two times a day (BID) | ORAL | 1 refills | Status: DC

## 2019-02-26 MED ORDER — VARENICLINE TARTRATE 0.5 MG X 11 & 1 MG X 42 PO MISC: ORAL | 0 refills | Status: DC

## 2019-02-26 NOTE — Telephone Encounter (Signed)
Attempted to reach patient to go over AVS and instructions. No answer. Left message to call back on mobile and home number to discuss if she would like. Otherwise that the AVS would be mailed to her to review.

## 2019-02-26 NOTE — Telephone Encounter (Signed)
error 

## 2019-02-26 NOTE — Telephone Encounter (Signed)
Virtual Visit Pre-Appointment Phone Call  "(Name), I am calling you today to discuss your upcoming appointment. We are currently trying to limit exposure to the virus that causes COVID-19 by seeing patients at home rather than in the office."  1. "What is the BEST phone number to call the day of the visit?" - include this in appointment notes  2. "Do you have or have access to (through a family member/friend) a smartphone with video capability that we can use for your visit?" a. If yes - list this number in appt notes as "cell" (if different from BEST phone #) and list the appointment type as a VIDEO visit in appointment notes b. If no - list the appointment type as a PHONE visit in appointment notes  3. Confirm consent - "In the setting of the current Covid19 crisis, you are scheduled for a (phone or video) visit with your provider on (02/26/2019) at (9:30 AM).  Just as we do with many in-office visits, in order for you to participate in this visit, we must obtain consent.  If you'd like, I can send this to your mychart (if signed up) or email for you to review.  Otherwise, I can obtain your verbal consent now.  All virtual visits are billed to your insurance company just like a normal visit would be.  By agreeing to a virtual visit, we'd like you to understand that the technology does not allow for your provider to perform an examination, and thus may limit your provider's ability to fully assess your condition. If your provider identifies any concerns that need to be evaluated in person, we will make arrangements to do so.  Finally, though the technology is pretty good, we cannot assure that it will always work on either your or our end, and in the setting of a video visit, we may have to convert it to a phone-only visit.  In either situation, we cannot ensure that we have a secure connection.  Are you willing to proceed?" Yes  4. Advise patient to be prepared - "Two hours prior to your  appointment, go ahead and check your blood pressure, pulse, oxygen saturation, and your weight (if you have the equipment to check those) and write them all down. When your visit starts, your provider will ask you for this information. If you have an Apple Watch or Kardia device, please plan to have heart rate information ready on the day of your appointment. Please have a pen and paper handy nearby the day of the visit as well."  5. Give patient instructions for MyChart download to smartphone OR Doximity/[email or web address].me as below if video visit (depending on what platform provider is using)  6. Inform patient they will receive a phone call 15 minutes prior to their appointment time (may be from unknown caller ID) so they should be prepared to answer    TELEPHONE CALL NOTE  Ruthanna Macchia has been deemed a candidate for a follow-up tele-health visit to limit community exposure during the Covid-19 pandemic. I spoke with the patient via phone to ensure availability of phone/video source, confirm preferred email & phone number, and discuss instructions and expectations.  I reminded Alexarae Oliva to be prepared with any vital sign and/or heart rhythm information that could potentially be obtained via home monitoring, at the time of her visit. I reminded Jeanne Diefendorf to expect a phone call prior to her visit.  Britt Bottom, CMA 02/26/2019 9:39 AM  INSTRUCTIONS FOR DOWNLOADING THE MYCHART APP TO SMARTPHONE  - The patient must first make sure to have activated MyChart and know their login information - If Apple, go to [employer] and type in MyChart in the search bar and download the app. If Android, ask patient to go to [employer] and type in [place] in the search bar and download the app. The app is free but as with any other app downloads, their phone may require them to verify saved payment information or Apple/Android password.  - The patient will need to then log into the app with  their MyChart username and password, and select  as their healthcare provider to link the account. When it is time for your visit, go to the MyChart app, find appointments, and click Begin Video Visit. Be sure to Select Allow for your device to access the Microphone and Camera for your visit. You will then be connected, and your provider will be with you shortly.  **If they have any issues connecting, or need assistance please contact MyChart service desk [phone])**  **If using a computer, in order to ensure the best quality for their visit they will need to use either of the following Internet Browsers: [employer], or Google Chrome**  IF USING DOXIMITY or DOXY.ME - The patient will receive a link just prior to their visit by text.     FULL LENGTH CONSENT FOR TELE-HEALTH VISIT   I hereby voluntarily request, consent and authorize [place] and its employed or contracted physicians, physician assistants, nurse practitioners or other licensed health care professionals (the Practitioner), to provide me with telemedicine health care services (the "Services") as deemed necessary by the treating Practitioner. I acknowledge and consent to receive the Services by the Practitioner via telemedicine. I understand that the telemedicine visit will involve communicating with the Practitioner through live audiovisual communication technology and the disclosure of certain medical information by electronic transmission. I acknowledge that I have been given the opportunity to request an in-person assessment or other available alternative prior to the telemedicine visit and am voluntarily participating in the telemedicine visit.  I understand that I have the right to withhold or withdraw my consent to the use of telemedicine in the course of my care at any time, without affecting my right to future care or treatment, and that the Practitioner or I may terminate the telemedicine  visit at any time. I understand that I have the right to inspect all information obtained and/or recorded in the course of the telemedicine visit and may receive copies of available information for a reasonable fee.  I understand that some of the potential risks of receiving the Services via telemedicine include:  Marland Kitchen Delay or interruption in medical evaluation due to technological equipment failure or disruption; . Information transmitted may not be sufficient (e.g. poor resolution of images) to allow for appropriate medical decision making by the Practitioner; and/or  . In rare instances, security protocols could fail, causing a breach of personal health information.  Furthermore, I acknowledge that it is my responsibility to provide information about my medical history, conditions and care that is complete and accurate to the best of my ability. I acknowledge that Practitioner's advice, recommendations, and/or decision may be based on factors not within their control, such as incomplete or inaccurate data provided by me or distortions of diagnostic images or specimens that may result from electronic transmissions. I understand that the practice of medicine is not an exact science  and that Practitioner makes no warranties or guarantees regarding treatment outcomes. I acknowledge that I will receive a copy of this consent concurrently upon execution via email to the email address I last provided but may also request a printed copy by calling the office of [place].    I understand that my insurance will be billed for this visit.   I have read or had this consent read to me. . I understand the contents of this consent, which adequately explains the benefits and risks of the Services being provided via telemedicine.  . I have been provided ample opportunity to ask questions regarding this consent and the Services and have had my questions answered to my satisfaction. . I give my informed consent for  the services to be provided through the use of telemedicine in my medical care  By participating in this telemedicine visit I agree to the above.

## 2019-02-26 NOTE — Progress Notes (Signed)
Virtual Visit via Telephone Note   This visit type was conducted due to national recommendations for restrictions regarding the COVID-19 Pandemic (e.g. social distancing) in an effort to limit this patient's exposure and mitigate transmission in our community.  Due to her co-morbid illnesses, this patient is at least at moderate risk for complications without adequate follow up.  This format is felt to be most appropriate for this patient at this time.  The patient did not have access to video technology/had technical difficulties with video requiring transitioning to audio format only (telephone).  All issues noted in this document were discussed and addressed.  No physical exam could be performed with this format.  Please refer to the patient's chart for her  consent to telehealth for [facility].   Evaluation Performed:  Follow-up visit  Date:  02/26/2019   ID:  Kristin Parker, DOB [birth year], MRN [id number]  Patient Location: Home Provider Location: Home  PCP:  Olin Hauser, DO  Cardiologist:  [name], MD Electrophysiologist:  None   Chief Complaint:  Shortness of breath  History of Present Illness:    Kristin Parker is a 68 y.o. female with history of coronary artery disease (up to 40% distal LMCA by cath in 05/2017), carotid artery stenosis, hypertension, hyperlipidemia, depression, and anxiety.  We are speaking today for follow-up of her chronic shortness of breath.  She reports that exertional dyspnea is stable to slightly worse than our last visit ~6 months ago.  It is most noticeable when he walks uphill.  She also feels her heart "pounding" when she exerts herself.  She denies chest pain, lightheadedness, orthopnea, PND, and edema.  She noted constipation with addition of ezetimibe, though this has improved with a stool softener.  She is otherwise tolerating her medications well.    Ms. Hypes continues to smoke (varies from 1/4 PPD to >1 PPD depending on  stress level).  She has been unable to quit in the past with nicotine patches but is interesting in quitting.  The patient does not have symptoms concerning for COVID-19 infection (fever, chills, cough, or new shortness of breath).    Past Medical History:  Diagnosis Date   Anxiety    Aortic valve sclerosis    Bilateral carotid bruits    Carotid artery stenosis    Hypertension    Mitral regurgitation    Mixed hyperlipidemia    Shoulder pain    Sleeping difficulties    Past Surgical History:  Procedure Laterality Date   BREAST BIOPSY Left 08/13/2018   affirm bx, x clip, path pending   CATARACT EXTRACTION Left    2017   COLONOSCOPY  2012   Dr Candace Cruise   LEFT HEART CATH AND CORONARY ANGIOGRAPHY N/A 06/25/2017   Procedure: LEFT HEART CATH AND CORONARY ANGIOGRAPHY;  Surgeon: [name], MD;  Location: [facility];  Service: Cardiovascular;  Laterality: N/A;     Current Meds  Medication Sig   albuterol (PROVENTIL HFA;VENTOLIN HFA) 108 (90 Base) MCG/ACT inhaler INHALE 2 PUFFS INTO THE LUNGS EVERY 4 HOURS AS NEEDED FOR WHEEZING OR SHORTNESS OF BREATH OR COUGH   amLODipine (NORVASC) 10 MG tablet TAKE 1 TABLET BY MOUTH ONCE DAILY   aspirin EC 81 MG tablet Take 1 tablet (81 mg total) by mouth daily.   cetirizine (ZYRTEC) 10 MG tablet Take 10 mg by mouth daily.   ezetimibe (ZETIA) 10 MG tablet Take 1 tablet (10 mg total) by mouth daily.   fluticasone (  FLONASE) 50 MCG/ACT nasal spray INSTILL 2 SPRAYS IN EACH NOSTRIL EVERY DAY   hydrOXYzine (ATARAX/VISTARIL) 10 MG tablet TAKE 1 TABLET BY MOUTH AT BEDTIME AS NEEDED FOR ANXIETY OR SLEEP   isosorbide mononitrate (IMDUR) 60 MG 24 hr tablet Take 1 tablet (60 mg total) by mouth daily.   losartan (COZAAR) 100 MG tablet TAKE 1 TABLET BY MOUTH ONCE DAILY   meloxicam (MOBIC) 15 MG tablet Take 1 tablet (15 mg total) by mouth daily as needed for pain. May take for 1-2 weeks at a time then as needed in future   Omega-3  Fatty Acids (FISH OIL) 1000 MG CAPS Take 1 capsule (1,000 mg total) by mouth 2 (two) times daily.   rosuvastatin (CRESTOR) 40 MG tablet Take 1 tablet (40 mg total) by mouth daily.   sertraline (ZOLOFT) 100 MG tablet Take 1 tablet (100 mg total) by mouth daily.   tiotropium (SPIRIVA) 18 MCG inhalation capsule Place 1 capsule (18 mcg total) into inhaler and inhale daily.     Allergies:   Bactrim [sulfamethoxazole-trimethoprim] and Aleve [naproxen sodium]   Social History   Tobacco Use   Smoking status: Current Every Day Smoker    Packs/day: 0.50    Years: 30.00    Pack years: 15.00    Types: Cigarettes   Smokeless tobacco: Current User  Substance Use Topics   Alcohol use: No    Alcohol/week: 0.0 standard drinks   Drug use: No     Family Hx: The patient's family history includes Cancer in her father and mother; Heart disease (age of onset: 75) in her mother; Multiple sclerosis in her sister; Valvular heart disease (age of onset: 42) in her brother. There is no history of Breast cancer.  ROS:   Please see the history of present illness.    All other systems reviewed and are negative.   Prior CV studies:   The following studies were reviewed today:  Cath/PCI:  LHC (06/25/17): Mild to moderate CAD, including 40% distal LMCA, 20% ostial LAD, 20-30% mid LAD, and sequential 30% proximal and mid LCx stenoses. Small, nondominant RCA without significant disease. Normal left ventricular filling pressure.  Non-Invasive Evaluation(s):  ETT (06/18/17): Abnormal exercise tolerance test, the target heart rate not achieved. 1-2 mm horizontal ST depressions noted in inferior leads. Patient only achieved 84% MPHR due to shortness of breath and fatigue.  Carotid artery Duplex (03/01/17): Smooth plaque, bilaterally. 40-59% RICA stenosis. 4-69% LICA stenosis. Patent vertebral arteries with antegrade flow. Normal subclavian arteries, bilaterally.  TTE (03/01/17): Normal LV size and function  with LVEF 60-65% and grade 1 diastolic dysfunction. Mild MR. Normal RV size and function. Normal PA pressure.  Labs/Other Tests and Data Reviewed:    EKG:  No ECG reviewed.  Recent Labs: 06/18/2018: ALT 11; BUN 12; Creat 0.91; Hemoglobin 13.8; Platelets 236; Potassium 4.0; Sodium 141   Recent Lipid Panel Lab Results  Component Value Date/Time   CHOL 163 06/18/2018 08:02 AM   TRIG 137 06/18/2018 08:02 AM   HDL 44 (L) 06/18/2018 08:02 AM   CHOLHDL 3.7 06/18/2018 08:02 AM   LDLCALC 95 06/18/2018 08:02 AM   LDLDIRECT 62 05/28/2017 10:08 AM    Wt Readings from Last 3 Encounters:  02/26/19 156 lb (70.8 kg)  12/19/18 157 lb (71.2 kg)  11/26/18 157 lb (71.2 kg)     Objective:    Vital Signs:  BP 118/68 (BP Location: Left Arm, Patient Position: Sitting, Cuff Size: Normal)    Pulse 66  Ht 5\' 7"  (1.702 m)    Wt 156 lb (70.8 kg)    BMI 24.43 kg/m    VITAL SIGNS:  reviewed  ASSESSMENT & PLAN:    Coronary artery disease with stable angina: Ms. Pe has relatively stable exertional dyspnea (question anginal equivalent) without chest pain.  I think that her continued smoking is playing a role in her dyspnea.  We will work on tobacco cessation, as detailed below.  We will continue antianginal therapy with isosorbide mononitrate and amlodipine, as well as secondary prevention with aspirin, rosuvastatin, and ezetimibe.  Will check CBC, CMP, and lipid panel before follow-up visit.  Hypertension: Blood pressure adequately controlled.  Continue current medications.  Hyperlipidemia: Other than constipation that has improved with OTC stool softener, Ms. Pogosyan is tolerating rosuvastatin and ezetimibe well.  She did not present for follow-up lipid panel after our most recent medication adjustments.  We will plan for a CMP and lipid panel shortly before her follow-up visit with me in 3 months.  Tobacco abuse: Importance of tobacco cessation was reinforced today.  The patient was counseled on  tobacco cessation today for 5 minutes.  Counseling included reviewing the risks of smoking tobacco products, how it impacts the patient's current medical diagnoses and different strategies for quitting.  Pharmacotherapy to aid in tobacco cessation was prescribed today.  Carotid artery stenosis: Mild to moderate bilateral ICA stenoses noted by Doppler in 2018.  We will readress repeating a Doppler examination when we follow-up in 3 months.  We will continue aggressive lipid and blood pressure management as well as low-dose aspirin.  COVID-19 Education: The signs and symptoms of COVID-19 were discussed with the patient and how to seek care for testing (follow up with PCP or arrange E-visit).  The importance of social distancing was discussed today.  Time:   Today, I have spent 11 minutes with the patient with telehealth technology discussing the above problems.     Medication Adjustments/Labs and Tests Ordered: Current medicines are reviewed at length with the patient today.  Concerns regarding medicines are outlined above.   Tests Ordered: Orders Placed This Encounter  Procedures   CBC   Comprehensive metabolic panel   Lipid panel    Medication Changes: Meds ordered this encounter  Medications   varenicline (CHANTIX CONTINUING MONTH PAK) 1 MG tablet    Sig: Take 1 tablet (1 mg total) by mouth 2 (two) times daily.    Dispense:  60 tablet    Refill:  1   varenicline (CHANTIX STARTING MONTH PAK) 0.5 MG X 11 & 1 MG X 42 tablet    Sig: Take 0.5 mg tab by mouth once daily for 3 days, then increase to 0.5 mg tab twice daily for 4 days, then increase to 1 mg tab twice daily.    Dispense:  53 tablet    Refill:  0    Disposition:  Follow up in 3 month(s)  Signed, [name], MD  02/26/2019 9:50 AM    [facility]

## 2019-02-26 NOTE — Patient Instructions (Signed)
Medication Instructions:  Your physician has recommended you make the following change in your medication:  1- CHANTIX starter pack and 2 months of maintenance to help you stop smoking.  See the instructions on prescription and the attached information on quitting smoking as a guide.  If you need a refill on your cardiac medications before your next appointment, please call your pharmacy.   Lab work: Your physician recommends that you return for lab work in: [place] will need to be FASTING.  - Will check LIPID, CBC (no diff), CMP.  - You can schedule to be done in our office.   If you have labs (blood work) drawn today and your tests are completely normal, you will receive your results only by:  [place] (if you have MyChart) OR  A paper copy in the mail If you have any lab test that is abnormal or we need to change your treatment, we will call you to review the results.  Testing/Procedures: none  Follow-Up: At [facility], you and your health needs are our priority.  As part of our continuing mission to provide you with exceptional heart care, we have created designated Provider Care Teams.  These Care Teams include your primary Cardiologist (physician) and Advanced Practice Providers (APPs -  Physician Assistants and Nurse Practitioners) who all work together to provide you with the care you need, when you need it. You will need a follow up appointment in 3 months.  Please make sure to have Fasting Lab work scheduled and done in our office within the week before the appointment once scheduled.   Please call our office 2 months in advance to schedule this appointment.  You may see DR Harrell Gave END or one of the following Advanced Practice Providers on your designated Care Team:   Murray Hodgkins, NP Christell Faith, PA-C  Marrianne Mood, PA-C    Varenicline oral tablets What is this medicine? VARENICLINE (var EN i kleen) is used to help  people quit smoking. It is used with a patient support program recommended by your physician. This medicine may be used for other purposes; ask your health care provider or pharmacist if you have questions. COMMON BRAND NAME(S): Chantix What should I tell my health care provider before I take this medicine? They need to know if you have any of these conditions: -heart disease -if you often drink alcohol -kidney disease -mental illness -on hemodialysis -seizures -history of stroke -suicidal thoughts, plans, or attempt; a previous suicide attempt by you or a family member -an unusual or allergic reaction to varenicline, other medicines, foods, dyes, or preservatives -pregnant or trying to get pregnant -breast-feeding How should I use this medicine? Take this medicine by mouth after eating. Take with a full glass of water. Follow the directions on the prescription label. Take your doses at regular intervals. Do not take your medicine more often than directed. There are 3 ways you can use this medicine to help you quit smoking; talk to your health care professional to decide which plan is right for you: 1) you can choose a quit date and start this medicine 1 week before the quit date, or, 2) you can start taking this medicine before you choose a quit date, and then pick a quit date between day 8 and 35 days of treatment, or, 3) if you are not sure that you are able or willing to quit smoking right away, start taking this medicine and slowly decrease  the amount you smoke as directed by your health care professional with the goal of being cigarette-free by week 12 of treatment. Stick to your plan; ask about support groups or other ways to help you remain cigarette-free. If you are motivated to quit smoking and did not succeed during a previous attempt with this medicine for reasons other than side effects, or if you returned to smoking after this treatment, speak with your health care professional  about whether another course of this medicine may be right for you. A special MedGuide will be given to you by the pharmacist with each prescription and refill. Be sure to read this information carefully each time. Talk to your pediatrician regarding the use of this medicine in children. This medicine is not approved for use in children. Overdosage: If you think you have taken too much of this medicine contact a poison control center or emergency room at once. NOTE: This medicine is only for you. Do not share this medicine with others. What if I miss a dose? If you miss a dose, take it as soon as you can. If it is almost time for your next dose, take only that dose. Do not take double or extra doses. What may interact with this medicine? -alcohol -insulin -other medicines used to help people quit smoking -theophylline -warfarin This list may not describe all possible interactions. Give your health care provider a list of all the medicines, herbs, non-prescription drugs, or dietary supplements you use. Also tell them if you smoke, drink alcohol, or use illegal drugs. Some items may interact with your medicine. What should I watch for while using this medicine? It is okay if you do not succeed at your attempt to quit and have a cigarette. You can still continue your quit attempt and keep using this medicine as directed. Just throw away your cigarettes and get back to your quit plan. Talk to your health care provider before using other treatments to quit smoking. Using this medicine with other treatments to quit smoking may increase the risk for side effects compared to using a treatment alone. You may get drowsy or dizzy. Do not drive, use machinery, or do anything that needs mental alertness until you know how this medicine affects you. Do not stand or sit up quickly, especially if you are an older patient. This reduces the risk of dizzy or fainting spells. Decrease the number of alcoholic beverages  that you drink during treatment with this medicine until you know if this medicine affects your ability to tolerate alcohol. Some people have experienced increased drunkenness (intoxication), unusual or sometimes aggressive behavior, or no memory of things that have happened (amnesia) during treatment with this medicine. Sleepwalking can happen during treatment with this medicine, and can sometimes lead to behavior that is harmful to you, other people, or property. Stop taking this medicine and tell your doctor if you start sleepwalking or have other unusual sleep-related activity. After taking this medicine, you may get up out of bed and do an activity that you do not know you are doing. The next morning, you may have no memory of this. Activities include driving a car ("sleep-driving"), making and eating food, talking on the phone, sexual activity, and sleep-walking. Serious injuries have occurred. Stop the medicine and call your doctor right away if you find out you have done any of these activities. Do not take this medicine if you have used alcohol that evening. Do not take it if you have taken another  medicine for sleep. The risk of doing these sleep-related activities is higher. Patients and their families should watch out for new or worsening depression or thoughts of suicide. Also watch out for sudden changes in feelings such as feeling anxious, agitated, panicky, irritable, hostile, aggressive, impulsive, severely restless, overly excited and hyperactive, or not being able to sleep. If this happens, call your health care professional. If you have diabetes and you quit smoking, the effects of insulin may be increased and you may need to reduce your insulin dose. Check with your doctor or health care professional about how you should adjust your insulin dose. What side effects may I notice from receiving this medicine? Side effects that you should report to your doctor or health care professional as  soon as possible: -allergic reactions like skin rash, itching or hives, swelling of the face, lips, tongue, or throat -acting aggressive, being angry or violent, or acting on dangerous impulses -breathing problems -changes in emotions or moods -chest pain or chest tightness -feeling faint or lightheaded, falls -hallucination, loss of contact with reality -mouth sores -redness, blistering, peeling or loosening of the skin, including inside the mouth -signs and symptoms of a stroke like changes in vision; confusion; trouble speaking or understanding; severe headaches; sudden numbness or weakness of the face, arm or leg; trouble walking; dizziness; loss of balance or coordination -seizures -sleepwalking -suicidal thoughts or other mood changes Side effects that usually do not require medical attention (report to your doctor or health care professional if they continue or are bothersome): -constipation -gas -headache -nausea, vomiting -strange dreams -trouble sleeping This list may not describe all possible side effects. Call your doctor for medical advice about side effects. You may report side effects to FDA at 1-800-FDA-1088. Where should I keep my medicine? Keep out of the reach of children. Store at room temperature between 15 and 30 degrees C (59 and 86 degrees F). Throw away any unused medicine after the expiration date. NOTE: This sheet is a summary. It may not cover all possible information. If you have questions about this medicine, talk to your doctor, pharmacist, or health care provider.  2019 Elsevier/Gold Standard (2018-04-11 12:48:08)      Steps to Quit Smoking  Smoking tobacco can be bad for your health. It can also affect almost every organ in your body. Smoking puts you and people around you at risk for many serious long-lasting (chronic) diseases. Quitting smoking is hard, but it is one of the best things that you can do for your health. It is never too late to  quit. What are the benefits of quitting smoking? When you quit smoking, you lower your risk for getting serious diseases and conditions. They can include:  Lung cancer or lung disease.  Heart disease.  Stroke.  Heart attack.  Not being able to have children (infertility).  Weak bones (osteoporosis) and broken bones (fractures). If you have coughing, wheezing, and shortness of breath, those symptoms may get better when you quit. You may also get sick less often. If you are pregnant, quitting smoking can help to lower your chances of having a baby of low birth weight. What can I do to help me quit smoking? Talk with your doctor about what can help you quit smoking. Some things you can do (strategies) include:  Quitting smoking totally, instead of slowly cutting back how much you smoke over a period of time.  Going to in-person counseling. You are more likely to quit if you go to  many counseling sessions.  Using resources and support systems, such as: ? Database administrator with a Social worker. ? Phone quitlines. ? Careers information officer. ? Support groups or group counseling. ? Text messaging programs. ? Mobile phone apps or applications.  Taking medicines. Some of these medicines may have nicotine in them. If you are pregnant or breastfeeding, do not take any medicines to quit smoking unless your doctor says it is okay. Talk with your doctor about counseling or other things that can help you. Talk with your doctor about using more than one strategy at the same time, such as taking medicines while you are also going to in-person counseling. This can help make quitting easier. What things can I do to make it easier to quit? Quitting smoking might feel very hard at first, but there is a lot that you can do to make it easier. Take these steps:  Talk to your family and friends. Ask them to support and encourage you.  Call phone quitlines, reach out to support groups, or work with a  Social worker.  Ask people who smoke to not smoke around you.  Avoid places that make you want (trigger) to smoke, such as: ? Bars. ? Parties. ? Smoke-break areas at work.  Spend time with people who do not smoke.  Lower the stress in your life. Stress can make you want to smoke. Try these things to help your stress: ? Getting regular exercise. ? Deep-breathing exercises. ? Yoga. ? Meditating. ? Doing a body scan. To do this, close your eyes, focus on one area of your body at a time from head to toe, and notice which parts of your body are tense. Try to relax the muscles in those areas.  Download or buy apps on your mobile phone or tablet that can help you stick to your quit plan. There are many free apps, such as QuitGuide from the [employer] Office manager for Disease Control and Prevention). You can find more support from smokefree.gov and other websites. This information is not intended to replace advice given to you by your health care provider. Make sure you discuss any questions you have with your health care provider. Document Released: 08/11/2009 Document Revised: 06/12/2016 Document Reviewed: 03/01/2015 Elsevier Interactive Patient Education  2019 [employer].

## 2019-03-17 ENCOUNTER — Other Ambulatory Visit: Payer: Self-pay | Admitting: Family Medicine

## 2019-03-17 ENCOUNTER — Other Ambulatory Visit: Payer: Self-pay | Admitting: Internal Medicine

## 2019-03-17 DIAGNOSIS — J432 Centrilobular emphysema: Secondary | ICD-10-CM

## 2019-03-20 ENCOUNTER — Other Ambulatory Visit: Payer: Self-pay | Admitting: Internal Medicine

## 2019-05-07 ENCOUNTER — Other Ambulatory Visit: Payer: Self-pay | Admitting: Family Medicine

## 2019-05-07 DIAGNOSIS — G8929 Other chronic pain: Secondary | ICD-10-CM

## 2019-05-12 ENCOUNTER — Other Ambulatory Visit: Payer: Self-pay | Admitting: Family Medicine

## 2019-05-12 DIAGNOSIS — J432 Centrilobular emphysema: Secondary | ICD-10-CM

## 2019-05-28 ENCOUNTER — Other Ambulatory Visit: Payer: Self-pay | Admitting: Family Medicine

## 2019-05-28 DIAGNOSIS — L239 Allergic contact dermatitis, unspecified cause: Secondary | ICD-10-CM

## 2019-06-11 ENCOUNTER — Other Ambulatory Visit: Payer: Self-pay | Admitting: Family Medicine

## 2019-06-11 DIAGNOSIS — F419 Anxiety disorder, unspecified: Secondary | ICD-10-CM

## 2019-06-15 ENCOUNTER — Other Ambulatory Visit: Payer: Self-pay | Admitting: Internal Medicine

## 2019-06-15 NOTE — Telephone Encounter (Signed)
Pt due for 3 month f/u. Please contact pt for future appointment.

## 2019-06-15 NOTE — Telephone Encounter (Signed)
Voicemail has been left for patient

## 2019-06-30 ENCOUNTER — Other Ambulatory Visit: Payer: Self-pay | Admitting: Family Medicine

## 2019-06-30 DIAGNOSIS — J432 Centrilobular emphysema: Secondary | ICD-10-CM

## 2019-07-07 ENCOUNTER — Ambulatory Visit (INDEPENDENT_AMBULATORY_CARE_PROVIDER_SITE_OTHER): Payer: Medicare HMO

## 2019-07-07 DIAGNOSIS — Z1239 Encounter for other screening for malignant neoplasm of breast: Secondary | ICD-10-CM

## 2019-07-07 DIAGNOSIS — Z0000 Encounter for general adult medical examination without abnormal findings: Secondary | ICD-10-CM

## 2019-07-07 DIAGNOSIS — Z780 Asymptomatic menopausal state: Secondary | ICD-10-CM | POA: Diagnosis not present

## 2019-07-07 NOTE — Progress Notes (Signed)
Subjective:   Kristin Parker is a 68 y.o. female who presents for Medicare Annual (Subsequent) preventive examination.  This visit is being conducted via phone call  - after an attmept to do on video chat - due to the COVID-19 pandemic. This patient has given me verbal consent via phone to conduct this visit, patient states they are participating from their home address. Some vital signs may be absent or patient reported.   Patient identification: identified by name, DOB, and current address.    Review of Systems:  Cardiac Risk Factors include: advanced age (>61men, >20 women);hypertension;dyslipidemia     Objective:     Vitals: There were no vitals taken for this visit.  There is no height or weight on file to calculate BMI.  Advanced Directives 07/07/2019 06/24/2018 06/25/2017 05/14/2017  Does Patient Have a Medical Advance Directive? No No No No  Would patient like information on creating a medical advance directive? - Yes (MAU/Ambulatory/Procedural Areas - Information given) No - Patient declined Yes (MAU/Ambulatory/Procedural Areas - Information given)    Tobacco Social History   Tobacco Use  Smoking Status Current Every Day Smoker  . Packs/day: 0.50  . Years: 30.00  . Pack years: 15.00  . Types: Cigarettes  Smokeless Tobacco Never Used  Tobacco Comment   not quite ready due to covid-19      Ready to quit: Yes Counseling given: Yes Comment: not quite ready due to covid-19    Clinical Intake:  Pre-visit preparation completed: Yes  Pain : 0-10 Pain Score: 4  Pain Type: Chronic pain Pain Location: Shoulder Pain Orientation: Right Pain Descriptors / Indicators: Aching Pain Onset: More than a month ago Pain Frequency: Intermittent     Nutritional Risks: None Diabetes: No  How often do you need to have someone help you when you read instructions, pamphlets, or other written materials from your doctor or pharmacy?: 1 - Never  Interpreter Needed?: No   Information entered by ::  ,LPN  Past Medical History:  Diagnosis Date  . Anxiety   . Aortic valve sclerosis   . Bilateral carotid bruits   . Carotid artery stenosis   . Hypertension   . Mitral regurgitation   . Mixed hyperlipidemia   . Shoulder pain   . Sleeping difficulties    Past Surgical History:  Procedure Laterality Date  . BREAST BIOPSY Left 08/13/2018   affirm bx, x clip, path pending  . CATARACT EXTRACTION Left    2017  . COLONOSCOPY  2012   Dr Candace Cruise  . LEFT HEART CATH AND CORONARY ANGIOGRAPHY N/A 06/25/2017   Procedure: LEFT HEART CATH AND CORONARY ANGIOGRAPHY;  Surgeon: [name], MD;  Location: [facility];  Service: Cardiovascular;  Laterality: N/A;   Family History  Problem Relation Age of Onset  . Cancer Mother        bladder cancer  . Heart disease Mother 65       Pacemaker  . Cancer Father        lung  . Multiple sclerosis Sister   . Valvular heart disease Brother 70       s/p bioprosthetic valve replacement at [facility]  . Breast cancer Neg Hx    Social History   Socioeconomic History  . Marital status: Divorced    Spouse name: Not on file  . Number of children: Not on file  . Years of education: Not on file  . Highest education level: GED or equivalent  Occupational History  .  Occupation: retired   Scientific laboratory technician  . Financial resource strain: Not hard at all  . Food insecurity    Worry: Never true    Inability: Never true  . Transportation needs    Medical: No    Non-medical: No  Tobacco Use  . Smoking status: Current Every Day Smoker    Packs/day: 0.50    Years: 30.00    Pack years: 15.00    Types: Cigarettes  . Smokeless tobacco: Never Used  . Tobacco comment: not quite ready due to covid-19   Substance and Sexual Activity  . Alcohol use: No    Alcohol/week: 0.0 standard drinks  . Drug use: No  . Sexual activity: Not on file  Lifestyle  . Physical activity    Days per week: 0 days    Minutes per session: 0  min  . Stress: Not at all  Relationships  . Social connections    Talks on phone: More than three times a week    Gets together: More than three times a week    Attends religious service: Never    Active member of club or organization: No    Attends meetings of clubs or organizations: Never    Relationship status: Divorced  Other Topics Concern  . Not on file  Social History Narrative  . Not on file    Outpatient Encounter Medications as of 07/07/2019  Medication Sig  . albuterol (VENTOLIN HFA) 108 (90 Base) MCG/ACT inhaler INHALE 2 PUFFS EVERY 4 HOURS FOR WHEEZING OR SHORTNESS OF BREATH AS NEEDED  . amLODipine (NORVASC) 10 MG tablet TAKE 1 TABLET BY MOUTH ONCE DAILY  . aspirin EC 81 MG tablet Take 1 tablet (81 mg total) by mouth daily.  . cetirizine (ZYRTEC) 10 MG tablet Take 10 mg by mouth daily.  Marland Kitchen ezetimibe (ZETIA) 10 MG tablet Take 1 tablet (10 mg total) by mouth daily.  . fluticasone (FLONASE) 50 MCG/ACT nasal spray INSTILL 2 SPRAYS IN EACH NOSTRIL EVERY DAY  . hydrOXYzine (ATARAX/VISTARIL) 10 MG tablet TAKE 1 TABLET BY MOUTH AT BEDTIME AS NEEDED ANXIETY/SLEEP  . isosorbide mononitrate (IMDUR) 60 MG 24 hr tablet Take 1 tablet (60 mg total) by mouth daily.  Marland Kitchen losartan (COZAAR) 100 MG tablet TAKE 1 TABLET BY MOUTH ONCE DAILY  . meloxicam (MOBIC) 15 MG tablet TAKE 1 TABLET BY MOUTH EVERY DAY AS NEEDED FOR PAIN. MAY TAKE FOR 1-2 WEEKS AT A TIME, THEN AS NEEDED IN THE FUTURE.  . Omega-3 Fatty Acids (FISH OIL) 1000 MG CAPS Take 1 capsule (1,000 mg total) by mouth 2 (two) times daily.  . rosuvastatin (CRESTOR) 40 MG tablet TAKE 1 TABLET BY MOUTH AT BEDTIME  . sertraline (ZOLOFT) 100 MG tablet TAKE 1 TABLET BY MOUTH ONCE DAILY  . SPIRIVA HANDIHALER 18 MCG inhalation capsule INHALE CONTENTS ON ONE CAPSULE BY MOUTH ONCE A DAY USING HANDIHALER  . varenicline (CHANTIX CONTINUING MONTH PAK) 1 MG tablet Take 1 tablet (1 mg total) by mouth 2 (two) times daily. (Patient not taking: Reported on  07/07/2019)  . varenicline (CHANTIX STARTING MONTH PAK) 0.5 MG X 11 & 1 MG X 42 tablet Take 0.5 mg tab by mouth once daily for 3 days, then increase to 0.5 mg tab twice daily for 4 days, then increase to 1 mg tab twice daily. (Patient not taking: Reported on 07/07/2019)   No facility-administered encounter medications on file as of 07/07/2019.     Activities of Daily Living In your present state  of health, do you have any difficulty performing the following activities: 07/07/2019  Hearing? N  Comment no hearing aids  Vision? Y  Comment glasses, goes to [facility] for eye checks  Difficulty concentrating or making decisions? N  Walking or climbing stairs? N  Dressing or bathing? N  Doing errands, shopping? N  Preparing Food and eating ? N  Using the Toilet? N  In the past six months, have you accidently leaked urine? N  Do you have problems with loss of bowel control? N  Managing your Medications? N  Managing your Finances? N  Housekeeping or managing your Housekeeping? N  Some recent data might be hidden    Patient Care Team: Olin Hauser, DO as PCP - General (Family Medicine) Christene Lye, MD (General Surgery)    Assessment:   This is a routine wellness examination for [place].  Exercise Activities and Dietary recommendations Current Exercise Habits: The patient does not participate in regular exercise at present, Exercise limited by: None identified  Goals    . Quit smoking / using tobacco     Smoking cessation discussed       Fall Risk: Fall Risk  07/07/2019 12/19/2018 11/26/2018 06/24/2018 05/30/2018  Falls in the past year? 0 0 0 No No  Follow up - Falls evaluation completed Falls evaluation completed - -    FALL RISK PREVENTION PERTAINING TO THE HOME:  Any stairs in or around the home? No  If so, are there any without handrails? No   Home free of loose throw rugs in walkways, pet beds, electrical cords, etc? Yes  Adequate lighting in your home to reduce  risk of falls? Yes   ASSISTIVE DEVICES UTILIZED TO PREVENT FALLS:  Life alert? No  Use of a cane, walker or w/c? No  Grab bars in the bathroom? No  Shower chair or bench in shower? No  Elevated toilet seat or a handicapped toilet? No   DME ORDERS:  DME order needed?  No   TIMED UP AND GO:  Unable to perform   Depression Screen PHQ 2/9 Scores 07/07/2019 12/19/2018 11/26/2018 06/24/2018  PHQ - 2 Score 1 0 0 0  PHQ- 9 Score - 0 0 0     Cognitive Function     6CIT Screen 06/24/2018 05/14/2017  What Year? 0 points 0 points  What month? 0 points 0 points  What time? 0 points 0 points  Count back from 20 0 points 0 points  Months in reverse 0 points 0 points  Repeat phrase 0 points 0 points  Total Score 0 0     There is no immunization history on file for this patient.  Qualifies for Shingles Vaccine? Yes  Zostavax completed n/a. Due for Shingrix. Education has been provided regarding the importance of this vaccine. Pt has been advised to call insurance company to determine out of pocket expense. Advised may also receive vaccine at local pharmacy or Health Dept. Verbalized acceptance and understanding.  Tdap:up to date   Flu Vaccine: Due for Flu vaccine.   Pneumococcal Vaccine: Due for Pneumococcal vaccine.    Screening Tests Health Maintenance  Topic Date Due  . DEXA SCAN  04/03/2014  . INFLUENZA VACCINE  05/30/2019  . PNA vac Low Risk Adult (1 of 2 - PCV13) 07/06/2020 (Originally 04/03/2014)  . MAMMOGRAM  08/06/2020  . TETANUS/TDAP  03/29/2021  . COLONOSCOPY  04/04/2021  . Hepatitis C Screening  Completed    Cancer Screenings:  Colorectal Screening: Completed  04/05/2011. Repeat every 10 years  Mammogram: Completed 08/06/2018, Repeat every year; declined currently   Bone Density: will get when she does mammogram.   Lung Cancer Screening: (Low Dose CT Chest recommended if Age 80-80 years, 30 pack-year currently smoking OR have quit w/in 15years.) does not qualify.    Additional Screening:  Hepatitis C Screening: does qualify; Completed 06/18/2018  Vision Screening: Recommended annual ophthalmology exams for early detection of glaucoma and other disorders of the eye. Is the patient up to date with their annual eye exam?  Yes  Who is the provider or what is the name of the office in which the pt attends annual eye exams? Goes to [facility] for vision checks   Dental Screening: Recommended annual dental exams for proper oral hygiene  Community Resource Referral:  CRR required this visit?  No       Plan:  I have personally reviewed and addressed the Medicare Annual Wellness questionnaire and have noted the following in the patient's chart:  A. Medical and social history B. Use of alcohol, tobacco or illicit drugs  C. Current medications and supplements D. Functional ability and status E.  Nutritional status F.  Physical activity G. Advance directives H. List of other physicians I.  Hospitalizations, surgeries, and ER visits in previous 12 months J.  [place] such as hearing and vision if needed, cognitive and depression L. Referrals and appointments   In addition, I have reviewed and discussed with patient certain preventive protocols, quality metrics, and best practice recommendations. A written personalized care plan for preventive services as well as general preventive health recommendations were provided to patient.  Signed,    Bevelyn Ngo, LPN  [id number] Nurse Health Advisor   Nurse Notes: none

## 2019-07-07 NOTE — Patient Instructions (Addendum)
Kristin Parker , Thank you for taking time to come for your Medicare Wellness Visit. I appreciate your ongoing commitment to your health goals. Please review the following plan we discussed and let me know if I can assist you in the future.   Screening recommendations/referrals: Colonoscopy: completed 04/05/2011 Mammogram: Please call [phone] to schedule your mammogram.  Bone Density: Please call [phone] to schedule  Recommended yearly ophthalmology/optometry visit for glaucoma screening and checkup Recommended yearly dental visit for hygiene and checkup  Vaccinations: Influenza vaccine: due now declined Pneumococcal vaccine: due now declined  Tdap vaccine: up to date Shingles vaccine: shingrix eligible    Advanced directives: please pick up a copy of this information next time your in the office.   Conditions/risks identified: If you wish to quit smoking, help is available. For free tobacco cessation program offerings call the [facility] at (502) 025-0555 or Live Well Line at 640-766-1932. You may also visit [email or web address] or email [email or web address] for more information on other programs.   Next appointment: Follow up in one year for your annual wellness visit   Preventive Care 65 Years and Older, Female Preventive care refers to lifestyle choices and visits with your health care provider that can promote health and wellness. What does preventive care include?  A yearly physical exam. This is also called an annual well check.  Dental exams once or twice a year.  Routine eye exams. Ask your health care provider how often you should have your eyes checked.  Personal lifestyle choices, including:  Daily care of your teeth and gums.  Regular physical activity.  Eating a healthy diet.  Avoiding tobacco and drug use.  Limiting alcohol use.  Practicing safe sex.  Taking low-dose aspirin every day.  Taking vitamin and mineral  supplements as recommended by your health care provider. What happens during an annual well check? The services and screenings done by your health care provider during your annual well check will depend on your age, overall health, lifestyle risk factors, and family history of disease. Counseling  Your health care provider may ask you questions about your:  Alcohol use.  Tobacco use.  Drug use.  Emotional well-being.  Home and relationship well-being.  Sexual activity.  Eating habits.  History of falls.  Memory and ability to understand (cognition).  Work and work Statistician.  Reproductive health. Screening  You may have the following tests or measurements:  Height, weight, and BMI.  Blood pressure.  Lipid and cholesterol levels. These may be checked every 5 years, or more frequently if you are over 69 years old.  Skin check.  Lung cancer screening. You may have this screening every year starting at age 22 if you have a 30-pack-year history of smoking and currently smoke or have quit within the past 15 years.  Fecal occult blood test (FOBT) of the stool. You may have this test every year starting at age 69.  Flexible sigmoidoscopy or colonoscopy. You may have a sigmoidoscopy every 5 years or a colonoscopy every 10 years starting at age 36.  Hepatitis C blood test.  Hepatitis B blood test.  Sexually transmitted disease (STD) testing.  Diabetes screening. This is done by checking your blood sugar (glucose) after you have not eaten for a while (fasting). You may have this done every 1-3 years.  Bone density scan. This is done to screen for osteoporosis. You may have this done starting at age 36.  Mammogram. This may be done every  1-2 years. Talk to your health care provider about how often you should have regular mammograms. Talk with your health care provider about your test results, treatment options, and if necessary, the need for more tests. Vaccines  Your  health care provider may recommend certain vaccines, such as:  Influenza vaccine. This is recommended every year.  Tetanus, diphtheria, and acellular pertussis (Tdap, Td) vaccine. You may need a Td booster every 10 years.  Zoster vaccine. You may need this after age 18.  Pneumococcal 13-valent conjugate (PCV13) vaccine. One dose is recommended after age 43.  Pneumococcal polysaccharide (PPSV23) vaccine. One dose is recommended after age 18. Talk to your health care provider about which screenings and vaccines you need and how often you need them. This information is not intended to replace advice given to you by your health care provider. Make sure you discuss any questions you have with your health care provider. Document Released: 11/11/2015 Document Revised: 07/04/2016 Document Reviewed: 08/16/2015 Elsevier Interactive Patient Education  2017 [place] Prevention in the Home Falls can cause injuries. They can happen to people of all ages. There are many things you can do to make your home safe and to help prevent falls. What can I do on the outside of my home?  Regularly fix the edges of walkways and driveways and fix any cracks.  Remove anything that might make you trip as you walk through a door, such as a raised step or threshold.  Trim any bushes or trees on the path to your home.  Use bright outdoor lighting.  Clear any walking paths of anything that might make someone trip, such as rocks or tools.  Regularly check to see if handrails are loose or broken. Make sure that both sides of any steps have handrails.  Any raised decks and porches should have guardrails on the edges.  Have any leaves, snow, or ice cleared regularly.  Use sand or salt on walking paths during winter.  Clean up any spills in your garage right away. This includes oil or grease spills. What can I do in the bathroom?  Use night lights.  Install grab bars by the toilet and in the tub and  shower. Do not use towel bars as grab bars.  Use non-skid mats or decals in the tub or shower.  If you need to sit down in the shower, use a plastic, non-slip stool.  Keep the floor dry. Clean up any water that spills on the floor as soon as it happens.  Remove soap buildup in the tub or shower regularly.  Attach bath mats securely with double-sided non-slip rug tape.  Do not have throw rugs and other things on the floor that can make you trip. What can I do in the bedroom?  Use night lights.  Make sure that you have a light by your bed that is easy to reach.  Do not use any sheets or blankets that are too big for your bed. They should not hang down onto the floor.  Have a firm chair that has side arms. You can use this for support while you get dressed.  Do not have throw rugs and other things on the floor that can make you trip. What can I do in the kitchen?  Clean up any spills right away.  Avoid walking on wet floors.  Keep items that you use a lot in easy-to-reach places.  If you need to reach something above you, use a strong  step stool that has a grab bar.  Keep electrical cords out of the way.  Do not use floor polish or wax that makes floors slippery. If you must use wax, use non-skid floor wax.  Do not have throw rugs and other things on the floor that can make you trip. What can I do with my stairs?  Do not leave any items on the stairs.  Make sure that there are handrails on both sides of the stairs and use them. Fix handrails that are broken or loose. Make sure that handrails are as long as the stairways.  Check any carpeting to make sure that it is firmly attached to the stairs. Fix any carpet that is loose or worn.  Avoid having throw rugs at the top or bottom of the stairs. If you do have throw rugs, attach them to the floor with carpet tape.  Make sure that you have a light switch at the top of the stairs and the bottom of the stairs. If you do not  have them, ask someone to add them for you. What else can I do to help prevent falls?  Wear shoes that:  Do not have high heels.  Have rubber bottoms.  Are comfortable and fit you well.  Are closed at the toe. Do not wear sandals.  If you use a stepladder:  Make sure that it is fully opened. Do not climb a closed stepladder.  Make sure that both sides of the stepladder are locked into place.  Ask someone to hold it for you, if possible.  Clearly mark and make sure that you can see:  Any grab bars or handrails.  First and last steps.  Where the edge of each step is.  Use tools that help you move around (mobility aids) if they are needed. These include:  Canes.  Walkers.  Scooters.  Crutches.  Turn on the lights when you go into a dark area. Replace any light bulbs as soon as they burn out.  Set up your furniture so you have a clear path. Avoid moving your furniture around.  If any of your floors are uneven, fix them.  If there are any pets around you, be aware of where they are.  Review your medicines with your doctor. Some medicines can make you feel dizzy. This can increase your chance of falling. Ask your doctor what other things that you can do to help prevent falls. This information is not intended to replace advice given to you by your health care provider. Make sure you discuss any questions you have with your health care provider. Document Released: 08/11/2009 Document Revised: 03/22/2016 Document Reviewed: 11/19/2014 Elsevier Interactive Patient Education  2017 [employer].

## 2019-07-20 NOTE — Progress Notes (Signed)
Follow-up Outpatient Visit Date: 07/22/2019  Primary Care Provider: Olin Hauser, DO [address] [address]  Chief Complaint: Fatigue  HPI:  Kristin Parker is a 68 y.o. year-old female with history of coronary artery disease (up to 40% distal LMCA by cath in 05/2017), carotid artery stenosis, hypertension, hyperlipidemia, depression, and anxiety, who presents for follow-up of chronic shortness of breath.  I last spoke with her in late April via virtual visit, at which time she felt relatively well.  Exertional dyspnea was stable or slightly worse, most noticeable when walking uphill.  We discussed the importance of tobacco cessation and agreed to try Chantix.  Today, Kristin Parker reports that she has noticed significant pain and stiffness from her shoulders through her knees.  It has been present for several months and is limiting her activity.  She uses meloxicam intermittently with transient relief.  Exertional dyspnea is about the same.  She denies chest pain.  She notes occasional flutters in her chest that lasts a second or 2.  She also has brief orthostatic lightheadedness without falls.  She continues to smoke and has not started using Chantix yet out of concern for side effects.  --------------------------------------------------------------------------------------------------  Cardiovascular History & Procedures: Cardiovascular Problems:  Aortic sclerosis  Carotid artery stenosis (mild to moderate)  Risk Factors:  Cerebrovascular disease, hypertension, hyperlipidemia, tobacco use, and age greater than 12  Cath/PCI:  LHC (06/25/17): Mild to moderate CAD, including 40% distal LMCA, 20% ostial LAD, 20-30% mid LAD, and sequential 30% proximal and mid LCx stenoses. Small, nondominant RCA without significant disease. Normal left ventricular filling pressure.  CV Surgery:  None  EP Procedures and Devices:  None  Non-Invasive Evaluation(s):  ETT  (06/18/17): Abnormal exercise tolerance test, the target heart rate not achieved. 1-2 mm horizontal ST depressions noted in inferior leads. Patient only achieved 84% MPHR due to shortness of breath and fatigue.  Carotid artery Duplex (03/01/17): Smooth plaque, bilaterally. 40-59% RICA stenosis. [id number] LICA stenosis. Patent vertebral arteries with antegrade flow. Normal subclavian arteries, bilaterally.  TTE (03/01/17): Normal LV size and function with LVEF 60-65% and grade 1 diastolic dysfunction. Mild MR. Normal RV size and function. Normal PA pressure.  Recent CV Pertinent Labs: Lab Results  Component Value Date   CHOL 163 06/18/2018   HDL 44 (L) 06/18/2018   LDLCALC 95 06/18/2018   LDLDIRECT 62 05/28/2017   TRIG 137 06/18/2018   CHOLHDL 3.7 06/18/2018   INR 0.94 06/24/2017   BNP 53.0 01/27/2017   K 4.0 06/18/2018   BUN 12 06/18/2018   CREATININE 0.91 06/18/2018    Past medical and surgical history were reviewed and updated in EPIC.  Current Meds  Medication Sig  . albuterol (VENTOLIN HFA) 108 (90 Base) MCG/ACT inhaler INHALE 2 PUFFS EVERY 4 HOURS FOR WHEEZING OR SHORTNESS OF BREATH AS NEEDED  . amLODipine (NORVASC) 10 MG tablet TAKE 1 TABLET BY MOUTH ONCE DAILY  . aspirin EC 81 MG tablet Take 1 tablet (81 mg total) by mouth daily.  . cetirizine (ZYRTEC) 10 MG tablet Take 10 mg by mouth daily.  Marland Kitchen ezetimibe (ZETIA) 10 MG tablet Take 1 tablet (10 mg total) by mouth daily.  . fluticasone (FLONASE) 50 MCG/ACT nasal spray INSTILL 2 SPRAYS IN EACH NOSTRIL EVERY DAY  . hydrOXYzine (ATARAX/VISTARIL) 10 MG tablet TAKE 1 TABLET BY MOUTH AT BEDTIME AS NEEDED ANXIETY/SLEEP  . isosorbide mononitrate (IMDUR) 60 MG 24 hr tablet Take 1 tablet (60 mg total) by mouth daily.  Marland Kitchen  losartan (COZAAR) 100 MG tablet TAKE 1 TABLET BY MOUTH ONCE DAILY  . meloxicam (MOBIC) 15 MG tablet TAKE 1 TABLET BY MOUTH EVERY DAY AS NEEDED FOR PAIN. MAY TAKE FOR 1-2 WEEKS AT A TIME, THEN AS NEEDED IN THE FUTURE.  . Omega-3  Fatty Acids (FISH OIL) 1000 MG CAPS Take 1 capsule (1,000 mg total) by mouth 2 (two) times daily. (Patient taking differently: Take 1 capsule by mouth daily. )  . rosuvastatin (CRESTOR) 40 MG tablet TAKE 1 TABLET BY MOUTH AT BEDTIME  . sertraline (ZOLOFT) 100 MG tablet TAKE 1 TABLET BY MOUTH ONCE DAILY  . SPIRIVA HANDIHALER 18 MCG inhalation capsule INHALE CONTENTS ON ONE CAPSULE BY MOUTH ONCE A DAY USING HANDIHALER    Allergies: Bactrim [sulfamethoxazole-trimethoprim] and Aleve [naproxen sodium]  Social History   Tobacco Use  . Smoking status: Current Every Day Smoker    Packs/day: 0.50    Years: 30.00    Pack years: 15.00    Types: Cigarettes  . Smokeless tobacco: Never Used  . Tobacco comment: not quite ready due to covid-19   Substance Use Topics  . Alcohol use: No    Alcohol/week: 0.0 standard drinks  . Drug use: No    Family History  Problem Relation Age of Onset  . Cancer Mother        bladder cancer  . Heart disease Mother 67       Pacemaker  . Cancer Father        lung  . Multiple sclerosis Sister   . Valvular heart disease Brother 45       s/p bioprosthetic valve [facility]  . Breast cancer Neg Hx     Review of Systems: Ms. Fickes notes easy bruising in her arms but otherwise no bleeding.  She also is concerned about her sleep at night.  Otherwise, a 12-system review of systems was performed and was negative except as noted in the HPI.  --------------------------------------------------------------------------------------------------  Physical Exam: BP 118/60 (BP Location: Left Arm, Patient Position: Sitting, Cuff Size: Normal)   Pulse 63   Ht 5\' 7"  (1.702 m)   Wt 153 lb 8 oz (69.6 kg)   SpO2 95%   BMI 24.04 kg/m   General: NAD. HEENT: No conjunctival pallor or scleral icterus.  Facemask in place. Neck: Supple without lymphadenopathy, thyromegaly, JVD, or HJR. No carotid bruit. Lungs: Normal work of breathing. Clear to auscultation bilaterally  without wheezes or crackles. Heart: Regular rate and rhythm with 2/6 systolic murmur loudest at the left lower sternal border.  No rubs or gallops. Non-displaced PMI. Abd: Bowel sounds present. Soft, NT/ND without hepatosplenomegaly Ext: No lower extremity edema. Radial, PT, and DP pulses are 2+ bilaterally. Skin: Warm and dry without rash.  EKG: Normal sinus rhythm with low voltage and nonspecific ST changes.  No significant change from prior tracing on 09/03/2018.  Lab Results  Component Value Date   WBC 8.0 06/18/2018   HGB 13.8 06/18/2018   HCT 41.2 06/18/2018   MCV 91.6 06/18/2018   PLT 236 06/18/2018    Lab Results  Component Value Date   NA 141 06/18/2018   K 4.0 06/18/2018   CL 104 06/18/2018   CO2 28 06/18/2018   BUN 12 06/18/2018   CREATININE 0.91 06/18/2018   GLUCOSE 102 (H) 06/18/2018   ALT 11 06/18/2018    Lab Results  Component Value Date   CHOL 163 06/18/2018   HDL 44 (L) 06/18/2018   LDLCALC 95 06/18/2018  LDLDIRECT 62 05/28/2017   TRIG 137 06/18/2018   CHOLHDL 3.7 06/18/2018    --------------------------------------------------------------------------------------------------  ASSESSMENT AND PLAN: Coronary artery disease: No chest pain reported.  Exertional dyspnea unchanged.  Will continue antianginal therapy with isosorbide mononitrate and amlodipine as well as aspirin and ezetimibe.  We will proceed with a statin holiday to see if Ms. Shrader myalgias improve.  Given bruising, we will check a CBC.  Hypertension: BP well controlled today.  Continue current medications.  We will check a CMP to ensure stable renal function and electrolytes.  Fatigue: Likely multifactorial including deconditioning, possible statin myopathy, and underlying lung disease.  We will check a TSH today.  Hyperlipidemia: We will check a fasting lipid panel and ALT today to ensure adequate response.  However, in the setting of significant myalgias, I have recommended a 1 month  statin holiday to see if her symptoms improve.  If this is the case, we will need to consider alternative dosing versus trial of a PCSK9 inhibitor.  Tobacco abuse: Unfortunately, Ms. Laborin continues to smoke and has not tried Chantix.  She is concerned about potential side effects, though I counseled her that continued smoking is likely more detrimental to her health.  We also brought up the possibility of nicotine replacement, though she had not had a good experience in the past with nicotine gum.  Carotid artery stenosis: No symptoms reported.  We will perform bilateral carotid Dopplers for follow-up of mild to moderate disease noted in 2018.  Follow-up: RTC 6 months.  [name], MD 07/22/2019 10:09 AM

## 2019-07-22 ENCOUNTER — Encounter: Payer: Self-pay | Admitting: Internal Medicine

## 2019-07-22 ENCOUNTER — Other Ambulatory Visit: Payer: Self-pay

## 2019-07-22 ENCOUNTER — Ambulatory Visit (INDEPENDENT_AMBULATORY_CARE_PROVIDER_SITE_OTHER): Payer: Medicare HMO | Admitting: Internal Medicine

## 2019-07-22 VITALS — BP 118/60 | HR 63 | Ht 67.0 in | Wt 153.5 lb

## 2019-07-22 DIAGNOSIS — I25118 Atherosclerotic heart disease of native coronary artery with other forms of angina pectoris: Secondary | ICD-10-CM

## 2019-07-22 DIAGNOSIS — E782 Mixed hyperlipidemia: Secondary | ICD-10-CM

## 2019-07-22 DIAGNOSIS — I10 Essential (primary) hypertension: Secondary | ICD-10-CM

## 2019-07-22 DIAGNOSIS — Z720 Tobacco use: Secondary | ICD-10-CM | POA: Diagnosis not present

## 2019-07-22 DIAGNOSIS — I6523 Occlusion and stenosis of bilateral carotid arteries: Secondary | ICD-10-CM | POA: Diagnosis not present

## 2019-07-22 DIAGNOSIS — R5383 Other fatigue: Secondary | ICD-10-CM | POA: Diagnosis not present

## 2019-07-22 DIAGNOSIS — Z79899 Other long term (current) drug therapy: Secondary | ICD-10-CM

## 2019-07-22 NOTE — Patient Instructions (Signed)
Medication Instructions:  Your physician recommends that you continue on your current medications as directed. Please refer to the Current Medication list given to you today.  STOP Crestor for 1 month. Let us know if you symptoms subside or not by calling or sending message through [place].  If you need a refill on your cardiac medications before your next appointment, please call your pharmacy.   Lab work: Your physician recommends that you return for lab work in: TODAY (LIPID, TSH, CMP, CBC).  If you have labs (blood work) drawn today and your tests are completely normal, you will receive your results only by: Marland Kitchen MyChart Message (if you have MyChart) OR . A paper copy in the mail If you have any lab test that is abnormal or we need to change your treatment, we will call you to review the results.  Testing/Procedures: NONE  Follow-Up: At [facility], you and your health needs are our priority.  As part of our continuing mission to provide you with exceptional heart care, we have created designated Provider Care Teams.  These Care Teams include your primary Cardiologist (physician) and Advanced Practice Providers (APPs -  Physician Assistants and Nurse Practitioners) who all work together to provide you with the care you need, when you need it. You will need a follow up appointment in 6 months.  Please call our office 2 months in advance to schedule this appointment.  You may see DR Harrell Gave END or one of the following Advanced Practice Providers on your designated Care Team:   Murray Hodgkins, NP Christell Faith, PA-C . Marrianne Mood, PA-C

## 2019-07-23 ENCOUNTER — Encounter: Payer: Self-pay | Admitting: Internal Medicine

## 2019-07-23 ENCOUNTER — Telehealth: Payer: Self-pay

## 2019-07-23 DIAGNOSIS — R5383 Other fatigue: Secondary | ICD-10-CM | POA: Insufficient documentation

## 2019-07-23 DIAGNOSIS — I6523 Occlusion and stenosis of bilateral carotid arteries: Secondary | ICD-10-CM

## 2019-07-23 LAB — CBC
Hematocrit: 42.1 % (ref 34.0–46.6)
Hemoglobin: 14.6 g/dL (ref 11.1–15.9)
MCH: 31.1 pg (ref 26.6–33.0)
MCHC: 34.7 g/dL (ref 31.5–35.7)
MCV: 90 fL (ref 79–97)
Platelets: 267 10*3/uL (ref 150–450)
RBC: 4.7 x10E6/uL (ref 3.77–5.28)
RDW: 12.9 % (ref 11.7–15.4)
WBC: 10.9 10*3/uL — ABNORMAL HIGH (ref 3.4–10.8)

## 2019-07-23 LAB — COMPREHENSIVE METABOLIC PANEL
ALT: 11 IU/L (ref 0–32)
AST: 13 IU/L (ref 0–40)
Albumin/Globulin Ratio: 1.7 (ref 1.2–2.2)
Albumin: 4.7 g/dL (ref 3.8–4.8)
Alkaline Phosphatase: 111 IU/L (ref 39–117)
BUN/Creatinine Ratio: 13 (ref 12–28)
BUN: 11 mg/dL (ref 8–27)
Bilirubin Total: 0.4 mg/dL (ref 0.0–1.2)
CO2: 24 mmol/L (ref 20–29)
Calcium: 9.7 mg/dL (ref 8.7–10.3)
Chloride: 101 mmol/L (ref 96–106)
Creatinine, Ser: 0.88 mg/dL (ref 0.57–1.00)
GFR calc Af Amer: 77 mL/min/{1.73_m2} (ref >59)
GFR calc non Af Amer: 67 mL/min/{1.73_m2} (ref >59)
Globulin, Total: 2.7 g/dL (ref 1.5–4.5)
Glucose: 104 mg/dL — ABNORMAL HIGH (ref 65–99)
Potassium: 3.8 mmol/L (ref 3.5–5.2)
Sodium: 139 mmol/L (ref 134–144)
Total Protein: 7.4 g/dL (ref 6.0–8.5)

## 2019-07-23 LAB — LIPID PANEL
Chol/HDL Ratio: 3.1 ratio (ref 0.0–4.4)
Cholesterol, Total: 135 mg/dL (ref 100–199)
HDL: 43 mg/dL (ref >39)
LDL Chol Calc (NIH): 63 mg/dL (ref 0–99)
Triglycerides: 175 mg/dL — ABNORMAL HIGH (ref 0–149)
VLDL Cholesterol Cal: 29 mg/dL (ref 5–40)

## 2019-07-23 LAB — TSH: TSH: 2.26 u[IU]/mL (ref 0.450–4.500)

## 2019-07-23 NOTE — Telephone Encounter (Signed)
Attempted to call patient. LMTCB 07/23/2019

## 2019-07-23 NOTE — Telephone Encounter (Signed)
-----   Message from [name], MD sent at 07/23/2019  7:15 AM EDT ----- Please let Kristin Parker know that her kidney function, liver function, and electrolytes are normal.  Her cholesterol is adequately controlled at this time.  Minimal elevation in her white blood cell count noted and is nonspecific.  TSH is normal.  I recommend holding rosuvastatin for 1 month, as discussed yesterday, to see if her muscle and joint pains improve.  We should also make arrangements for a follow-up carotid Doppler at the patient's convenience to ensure that mild to moderate carotid narrowing noted on prior study in 2018 has not progressed.

## 2019-07-24 ENCOUNTER — Other Ambulatory Visit: Payer: Self-pay | Admitting: Internal Medicine

## 2019-07-24 ENCOUNTER — Other Ambulatory Visit: Payer: Self-pay | Admitting: Family Medicine

## 2019-07-24 DIAGNOSIS — J432 Centrilobular emphysema: Secondary | ICD-10-CM

## 2019-07-24 DIAGNOSIS — J3089 Other allergic rhinitis: Secondary | ICD-10-CM

## 2019-07-24 NOTE — Telephone Encounter (Signed)
Carotid doppler order entered. No answer. Left message to call back with patient.

## 2019-07-24 NOTE — Addendum Note (Signed)
Addended by: Vanessa Ralphs on: 07/24/2019 09:43 AM   Modules accepted: Orders

## 2019-07-28 NOTE — Telephone Encounter (Signed)
No answer on home number. Ok to leave detailed message on this line per DPR. Left all results and recommendations for patient and to call back if any further questions.  Attempted to reach patient on mobile number and no answer. Unable to leave a detailed message on this number.  Routing to scheduling to call patient to schedule the carotid dopplers.

## 2019-07-28 NOTE — Telephone Encounter (Signed)
Please review held at 9/20 visit per note.

## 2019-07-28 NOTE — Telephone Encounter (Signed)
vm left for patient to call and schedule carotid

## 2019-07-29 ENCOUNTER — Encounter: Payer: Self-pay | Admitting: *Deleted

## 2019-09-04 ENCOUNTER — Other Ambulatory Visit: Payer: Self-pay | Admitting: Family Medicine

## 2019-09-04 DIAGNOSIS — L239 Allergic contact dermatitis, unspecified cause: Secondary | ICD-10-CM

## 2019-09-09 NOTE — Telephone Encounter (Signed)
Scheduled 12/11

## 2019-10-02 ENCOUNTER — Other Ambulatory Visit: Payer: Self-pay | Admitting: Internal Medicine

## 2019-10-02 ENCOUNTER — Other Ambulatory Visit: Payer: Self-pay | Admitting: Family Medicine

## 2019-10-02 DIAGNOSIS — G8929 Other chronic pain: Secondary | ICD-10-CM

## 2019-10-02 DIAGNOSIS — M25511 Pain in right shoulder: Secondary | ICD-10-CM

## 2019-10-08 ENCOUNTER — Other Ambulatory Visit: Payer: Self-pay

## 2019-10-08 ENCOUNTER — Other Ambulatory Visit: Payer: Self-pay | Admitting: Internal Medicine

## 2019-10-08 ENCOUNTER — Ambulatory Visit (INDEPENDENT_AMBULATORY_CARE_PROVIDER_SITE_OTHER): Payer: Medicare HMO

## 2019-10-08 DIAGNOSIS — I6523 Occlusion and stenosis of bilateral carotid arteries: Secondary | ICD-10-CM

## 2019-10-09 NOTE — Telephone Encounter (Signed)
Unable to leave VM due to VM full. Verify if pt is taking Crestor 40mg  tablet at this time before sending in Rx.

## 2019-10-12 NOTE — Telephone Encounter (Signed)
No answer, Unable to leave voicemail to verify medication Refill.

## 2019-10-13 ENCOUNTER — Other Ambulatory Visit: Payer: Self-pay | Admitting: *Deleted

## 2019-12-04 ENCOUNTER — Encounter: Payer: Self-pay | Admitting: Family Medicine

## 2019-12-04 ENCOUNTER — Ambulatory Visit (INDEPENDENT_AMBULATORY_CARE_PROVIDER_SITE_OTHER): Payer: Medicare HMO | Admitting: Family Medicine

## 2019-12-04 ENCOUNTER — Other Ambulatory Visit: Payer: Self-pay

## 2019-12-04 VITALS — BP 118/64 | HR 62 | Ht 67.0 in | Wt 151.2 lb

## 2019-12-04 DIAGNOSIS — R300 Dysuria: Secondary | ICD-10-CM | POA: Diagnosis not present

## 2019-12-04 DIAGNOSIS — N3000 Acute cystitis without hematuria: Secondary | ICD-10-CM

## 2019-12-04 DIAGNOSIS — B379 Candidiasis, unspecified: Secondary | ICD-10-CM

## 2019-12-04 DIAGNOSIS — T3695XA Adverse effect of unspecified systemic antibiotic, initial encounter: Secondary | ICD-10-CM

## 2019-12-04 LAB — POCT URINALYSIS DIPSTICK
Bilirubin, UA: NEGATIVE
Glucose, UA: NEGATIVE
Ketones, UA: NEGATIVE
Leukocytes, UA: NEGATIVE
Nitrite, UA: NEGATIVE
Protein, UA: NEGATIVE
Spec Grav, UA: 1.01 (ref 1.010–1.025)
Urobilinogen, UA: 0.2 E.U./dL
pH, UA: 5 (ref 5.0–8.0)

## 2019-12-04 MED ORDER — SULFAMETHOXAZOLE-TRIMETHOPRIM 800-160 MG PO TABS: 1.0000 | ORAL_TABLET | Freq: Two times a day (BID) | ORAL | 0 refills | Status: AC

## 2019-12-04 MED ORDER — FLUCONAZOLE 150 MG PO TABS: ORAL_TABLET | ORAL | 0 refills | Status: DC

## 2019-12-04 NOTE — Progress Notes (Signed)
Virtual Visit via Telephone The purpose of this virtual visit is to provide medical care while limiting exposure to the novel coronavirus (COVID19) for both patient and office staff.  Consent was obtained for phone visit:  Yes.   Answered questions that patient had about telehealth interaction:  Yes.   I discussed the limitations, risks, security and privacy concerns of performing an evaluation and management service by telephone. I also discussed with the patient that there may be a patient responsible charge related to this service. The patient expressed understanding and agreed to proceed.  Patient Location: Home Provider Location: [facility])  ---------------------------------------------------------------------- Chief Complaint  Patient presents with  . Urinary Tract Infection    onset 3 days frequancy, urgency, chills denies fever    S: Reviewed CMA documentation. I have called patient and gathered additional HPI as follows:   Follow-up UTI / Recurrent UTI ESBL Last visit 11/2018 for similar history of UTI evaluation, see note, typical UTI symptoms, treated with Keflex 500 TID x 7 days and had urine culture showed ESBL E Coli, multi resistant including keflex, was changed to [place] which was sensitive, she improved nearly resolved but never completely resolved. She then was treated with Bactrim-DS and it resolved  - Now presents with same issue, UTI frequency dysuria urgency, chills without fever over past 3 days Taking AZO PRN Also states she had some Cipro and took a few days of that but it did not help. - Associated with bladder spasms and discomfort  Denies any known or suspected exposure to person with or possibly with COVID19.  Denies any fevers, sweats, body ache, cough, shortness of breath, sinus pain or pressure, headache, abdominal pain, diarrhea, nausea vomiting gross hematuria  Past Medical History:  Diagnosis Date  . Anxiety   . Aortic  valve sclerosis   . Bilateral carotid bruits   . Carotid artery stenosis   . Hypertension   . Mitral regurgitation   . Mixed hyperlipidemia   . Shoulder pain   . Sleeping difficulties    Social History   Tobacco Use  . Smoking status: Current Every Day Smoker    Packs/day: 0.50    Years: 30.00    Pack years: 15.00    Types: Cigarettes  . Smokeless tobacco: Current User  . Tobacco comment: not quite ready due to covid-19   Substance Use Topics  . Alcohol use: No    Alcohol/week: 0.0 standard drinks  . Drug use: No    Current Outpatient Medications:  .  albuterol (VENTOLIN HFA) 108 (90 Base) MCG/ACT inhaler, INHALE 2 PUFFS EVERY 4 HOURS FOR WHEEZING OR SHORTNESS OF BREATH AS NEEDED, Disp: 18 g, Rfl: 2 .  amLODipine (NORVASC) 10 MG tablet, TAKE 1 TABLET BY MOUTH ONCE DAILY, Disp: 90 tablet, Rfl: 3 .  aspirin EC 81 MG tablet, Take 1 tablet (81 mg total) by mouth daily., Disp: , Rfl:  .  cetirizine (ZYRTEC) 10 MG tablet, Take 10 mg by mouth daily., Disp: , Rfl:  .  ezetimibe (ZETIA) 10 MG tablet, TAKE 1 TABLET BY MOUTH ONCE DAILY, Disp: 90 tablet, Rfl: 3 .  fluticasone (FLONASE) 50 MCG/ACT nasal spray, USE 2 SPRAYS INTO EACH NOSTRIL ONCE DAILY, Disp: 16 g, Rfl: 5 .  hydrOXYzine (ATARAX/VISTARIL) 10 MG tablet, TAKE 1 TABLET BY MOUTH AT BEDTIME AS NEEDED ANXIETY/SLEEP, Disp: 30 tablet, Rfl: 3 .  isosorbide mononitrate (IMDUR) 60 MG 24 hr tablet, TAKE 1 TABLET BY MOUTH ONCE DAILY, Disp: 90 tablet,  Rfl: 0 .  losartan (COZAAR) 100 MG tablet, TAKE 1 TABLET BY MOUTH ONCE DAILY, Disp: 90 tablet, Rfl: 1 .  meloxicam (MOBIC) 15 MG tablet, TAKE 1 TABLET BY MOUTH ONCE DAILY AS NEEDED FOR PAIN WITH FOOD, Disp: 30 tablet, Rfl: 5 .  Omega-3 Fatty Acids (FISH OIL) 1000 MG CAPS, Take 1 capsule (1,000 mg total) by mouth 2 (two) times daily. (Patient taking differently: Take 1 capsule by mouth daily. ), Disp: 60 capsule, Rfl: 0 .  sertraline (ZOLOFT) 100 MG tablet, TAKE 1 TABLET BY MOUTH ONCE DAILY,  Disp: 90 tablet, Rfl: 3 .  SPIRIVA HANDIHALER 18 MCG inhalation capsule, INHALE CONTENTS ON ONE CAPSULE BY MOUTH ONCE A DAY USING HANDIHALER, Disp: 30 capsule, Rfl: 11 .  varenicline (CHANTIX CONTINUING MONTH PAK) 1 MG tablet, Take 1 tablet (1 mg total) by mouth 2 (two) times daily., Disp: 60 tablet, Rfl: 1 .  varenicline (CHANTIX STARTING MONTH PAK) 0.5 MG X 11 & 1 MG X 42 tablet, Take 0.5 mg tab by mouth once daily for 3 days, then increase to 0.5 mg tab twice daily for 4 days, then increase to 1 mg tab twice daily., Disp: 53 tablet, Rfl: 0 .  fluconazole (DIFLUCAN) 150 MG tablet, Take one tablet by mouth on Day 1. Repeat dose 2nd tablet on Day 3., Disp: 2 tablet, Rfl: 0 .  rosuvastatin (CRESTOR) 40 MG tablet, TAKE 1 TABLET BY MOUTH AT BEDTIME (Patient not taking: Reported on 12/04/2019), Disp: 90 tablet, Rfl: 0 .  sulfamethoxazole-trimethoprim (BACTRIM DS) 800-160 MG tablet, Take 1 tablet by mouth 2 (two) times daily for 7 days., Disp: 14 tablet, Rfl: 0  Depression screen [facility] 2/9 12/04/2019 07/07/2019 12/19/2018  Decreased Interest 0 0 0  Down, Depressed, Hopeless 0 1 0  PHQ - 2 Score 0 1 0  Altered sleeping - - 0  Tired, decreased energy - - 0  Change in appetite - - 0  Feeling bad or failure about yourself  - - 0  Trouble concentrating - - 0  Moving slowly or fidgety/restless - - 0  Suicidal thoughts - - 0  PHQ-9 Score - - 0  Difficult doing work/chores - - Not difficult at all    GAD 7 : Generalized Anxiety Score 11/26/2018 09/18/2016  Nervous, Anxious, on Edge 0 1  Control/stop worrying 0 3  Worry too much - different things 0 2  Trouble relaxing 0 2  Restless 0 1  Easily annoyed or irritable 0 2  Afraid - awful might happen 0 1  Total GAD 7 Score 0 12  Anxiety Difficulty Not difficult at all Not difficult at all    -------------------------------------------------------------------------- O: No physical exam performed due to remote telephone encounter.  Lab results  reviewed.  Recent Results (from the past 2160 hour(s))  POCT urinalysis dipstick     Status: Abnormal   Collection Time: 12/04/19  4:26 PM  Result Value Ref Range   Color, UA dark amber     Comment: looks like patient had taken Azo hard to distinguish result    Clarity, UA cloudy    Glucose, UA Negative Negative   Bilirubin, UA Negative    Ketones, UA Negative    Spec Grav, UA 1.010 1.010 - 1.025   Blood, UA large    pH, UA 5.0 5.0 - 8.0   Protein, UA Negative Negative   Urobilinogen, UA 0.2 0.2 or 1.0 E.U./dL   Nitrite, UA Negative    Leukocytes, UA Negative Negative  Appearance     Odor      OLD RESULTS - Copied Urine Culture w/ sensitivities from 11/26/18   Urine Culture Order: [id number] Status:  Final result Visible to patient:  No (not released) Next appt:  None Dx:  Acute cystitis with hematuria Specimen Information: Urine       (important suggestion)  Newer results are available. Click to view them now.  Component 1 yr ago  MICRO NUMBER: [id number]   SPECIMEN QUALITY: Adequate   Sample Source URINE   STATUS: FINAL   ISOLATE 1: ESBL Escherichia coliAbnormal    Comment: Greater than 100,000 CFU/mL of Escherichia coli (ESBL) ESBL RESULT:    The organism has been confirmed as an ESBL producer.  Resulting Agency [facility]  Susceptibility   Esbl escherichia coli    URINE CULTURE, REFLEX    AMOX/CLAVULANIC >=32  Resistant    AMPICILLIN >=32  Resistant1    AMPICILLIN/SULBACTAM >=32  Resistant    CEFAZOLIN >=64  Resistant2    CEFEPIME 32  Resistant    CEFTRIAXONE >=64  Resistant    CIPROFLOXACIN >=4  Resistant    ERTAPENEM <=0.5  Sensitive    GENTAMICIN >=16  Resistant    IMIPENEM <=0.25  Sensitive    LEVOFLOXACIN >=8  Resistant    NITROFURANTOIN <=16  Sensitive    PIP/TAZO 8  Sensitive    TOBRAMYCIN >=16  Resistant    TRIMETH/SULFA <=20  Sensitive3         1 Extended spectrum beta-lactamase (ESBL) producing  organisms demonstrate decreased activity with   penicillins, cephalosporins and aztreonam.  2 For uncomplicated UTI caused by E. coli,  K. pneumoniae or P. mirabilis: Cefazolin is  susceptible if MIC <32 mcg/mL and predicts  susceptible to the oral agents cefaclor, cefdinir,  cefpodoxime, cefprozil, cefuroxime, cephalexin  and loracarbef.  3 Legend:  S = Susceptible I = Intermediate  R = Resistant NS = Not susceptible  * = Not tested NR = Not reported  **NN = See antimicrobic comments        Specimen Collected: 11/26/18 13:35         -------------------------------------------------------------------------- A&P:  Problem List Items Addressed This Visit    None    Visit Diagnoses    Acute cystitis without hematuria    -  Primary   Relevant Medications   sulfamethoxazole-trimethoprim (BACTRIM DS) 800-160 MG tablet   Other Relevant Orders   Urine Culture   POCT urinalysis dipstick (Completed)   Dysuria       Antibiotic-induced yeast infection       Relevant Medications   sulfamethoxazole-trimethoprim (BACTRIM DS) 800-160 MG tablet   fluconazole (DIFLUCAN) 150 MG tablet     Acute initial episode cystitis without evidence of systemic pyelo, has prior history [id number] with complicated recurrent ESBL E Coli based on last culture, incomplete resolution with keflex, macrobid previously - bactrim successful.  Plan Virtual visit today but asked her to come in to office for urine sample - Check UA + Check urine culture (after urine collection) - START Bactrim-DS BID 7 days - Rx Diflucan if need for yeast after antibiotics Future consider refer to urology if not improving or more recurrent UTI Return precautions given when to go to hospital if not improve or worse  Meds ordered this encounter  Medications  . sulfamethoxazole-trimethoprim (BACTRIM DS) 800-160 MG tablet    Sig: Take 1 tablet by mouth 2 (two) times daily for 7 days.    Dispense:  14 tablet  Refill:  0  . fluconazole (DIFLUCAN) 150 MG tablet    Sig:  Take one tablet by mouth on Day 1. Repeat dose 2nd tablet on Day 3.    Dispense:  2 tablet    Refill:  0    Added in just incase patient wants to pick this up as well. Or she can wait on it and fill if develops yeast infection. She has needed it in the past.    Follow-up: - Return in 1 week as needed if not improved  Patient verbalizes understanding with the above medical recommendations including the limitation of remote medical advice.  Specific follow-up and call-back criteria were given for patient to follow-up or seek medical care more urgently if needed.   - Time spent in direct consultation with patient on phone: 8 minutes  Nobie Putnam, [facility] 12/04/2019, 9:17 AM

## 2019-12-06 LAB — URINE CULTURE
MICRO NUMBER:: 10121617
SPECIMEN QUALITY:: ADEQUATE

## 2019-12-08 ENCOUNTER — Telehealth: Payer: Self-pay | Admitting: Family Medicine

## 2019-12-08 NOTE — Telephone Encounter (Signed)
Informed patient about her test result.

## 2019-12-08 NOTE — Telephone Encounter (Signed)
Pt return call pt call back # is  [phone]

## 2019-12-21 ENCOUNTER — Other Ambulatory Visit: Payer: Self-pay

## 2019-12-21 ENCOUNTER — Ambulatory Visit (INDEPENDENT_AMBULATORY_CARE_PROVIDER_SITE_OTHER): Payer: Medicare HMO | Admitting: Family Medicine

## 2019-12-21 ENCOUNTER — Encounter: Payer: Self-pay | Admitting: Family Medicine

## 2019-12-21 VITALS — BP 117/40 | HR 60 | Temp 97.4°F | Resp 16 | Ht 67.0 in | Wt 154.0 lb

## 2019-12-21 DIAGNOSIS — N390 Urinary tract infection, site not specified: Secondary | ICD-10-CM | POA: Diagnosis not present

## 2019-12-21 DIAGNOSIS — T3695XA Adverse effect of unspecified systemic antibiotic, initial encounter: Secondary | ICD-10-CM

## 2019-12-21 DIAGNOSIS — B379 Candidiasis, unspecified: Secondary | ICD-10-CM

## 2019-12-21 DIAGNOSIS — B9629 Other Escherichia coli [E. coli] as the cause of diseases classified elsewhere: Secondary | ICD-10-CM | POA: Diagnosis not present

## 2019-12-21 DIAGNOSIS — Z1612 Extended spectrum beta lactamase (ESBL) resistance: Secondary | ICD-10-CM

## 2019-12-21 LAB — POCT URINALYSIS DIPSTICK
Bilirubin, UA: NEGATIVE
Glucose, UA: NEGATIVE
Ketones, UA: NEGATIVE
Leukocytes, UA: NEGATIVE
Nitrite, UA: NEGATIVE
Protein, UA: NEGATIVE
Spec Grav, UA: 1.01 (ref 1.010–1.025)
Urobilinogen, UA: 0.2 E.U./dL
pH, UA: 5 (ref 5.0–8.0)

## 2019-12-21 MED ORDER — FLUCONAZOLE 150 MG PO TABS: ORAL_TABLET | ORAL | 0 refills | Status: DC

## 2019-12-21 MED ORDER — NITROFURANTOIN MONOHYD MACRO 100 MG PO CAPS: 100.0000 mg | ORAL_CAPSULE | Freq: Two times a day (BID) | ORAL | 0 refills | Status: DC

## 2019-12-21 NOTE — Progress Notes (Signed)
Subjective:    Patient ID: Kristin Parker, female    DOB: 1951/05/25, 69 y.o.   MRN: [id number]  Kristin Parker is a 69 y.o. female presenting on 12/21/2019 for Urinary Tract Infection (Azo taken today )   HPI   Follow-upUTI/ Recurrent UTI ESBL Last visit 12/04/19 for similar history of UTI, see note, typical UTI symptoms, treated with Bactrim-DS  BID x 7 days and had urine culture showed ESBL E Coli, multi resistant including keflex cipro and others see results. In past prior UTI has taken Keflex/Macrobid - Today reports that symptoms seemed to improve on recent antibiotics but never fully resolve - past 1-2 days had less symptoms, now today significant symptoms worsening now acutely with dysuria and some bladder spasms and a chill sensation, and urinary frequency. - She has not seen Urology - Associated with bladder spasms and discomfort Denies any flank pain or back pain, nausea vomiting fever, hematuria, reduced urine   Depression screen [facility] 2/9 12/04/2019 07/07/2019 12/19/2018  Decreased Interest 0 0 0  Down, Depressed, Hopeless 0 1 0  PHQ - 2 Score 0 1 0  Altered sleeping - - 0  Tired, decreased energy - - 0  Change in appetite - - 0  Feeling bad or failure about yourself  - - 0  Trouble concentrating - - 0  Moving slowly or fidgety/restless - - 0  Suicidal thoughts - - 0  PHQ-9 Score - - 0  Difficult doing work/chores - - Not difficult at all    Social History   Tobacco Use  . Smoking status: Current Every Day Smoker    Packs/day: 0.50    Years: 30.00    Pack years: 15.00    Types: Cigarettes  . Smokeless tobacco: Current User  . Tobacco comment: not quite ready due to covid-19   Substance Use Topics  . Alcohol use: No    Alcohol/week: 0.0 standard drinks  . Drug use: No    Review of Systems Per HPI unless specifically indicated above     Objective:    BP (!) 117/40   Pulse 60   Temp (!) 97.4 F (36.3 C) (Oral)   Resp 16   Ht 5\' 7"  (1.702 m)   Wt  154 lb (69.9 kg)   BMI 24.12 kg/m   Wt Readings from Last 3 Encounters:  12/21/19 154 lb (69.9 kg)  12/04/19 151 lb 3.2 oz (68.6 kg)  07/22/19 153 lb 8 oz (69.6 kg)    Physical Exam Vitals and nursing note reviewed.  Constitutional:      General: She is not in acute distress.    Appearance: She is well-developed. She is not diaphoretic.     Comments: Well-appearing, comfortable, cooperative  HENT:     Head: Normocephalic and atraumatic.  Eyes:     General:        Right eye: No discharge.        Left eye: No discharge.     Conjunctiva/sclera: Conjunctivae normal.  Cardiovascular:     Rate and Rhythm: Normal rate.  Pulmonary:     Effort: Pulmonary effort is normal.  Skin:    General: Skin is warm and dry.     Findings: No erythema or rash.  Neurological:     Mental Status: She is alert and oriented to person, place, and time.  Psychiatric:        Behavior: Behavior normal.     Comments: Well groomed, good eye contact, normal speech  and thoughts     Results for orders placed or performed in visit on 12/21/19 (from the past 24 hour(s))  POCT Urinalysis Dipstick     Status: Normal   Collection Time: 12/21/19 10:51 AM  Result Value Ref Range   Color, UA dark amber    Clarity, UA cloudy    Glucose, UA Negative Negative   Bilirubin, UA negative    Ketones, UA negative    Spec Grav, UA 1.010 1.010 - 1.025   Blood, UA large    pH, UA 5.0 5.0 - 8.0   Protein, UA Negative Negative   Urobilinogen, UA 0.2 0.2 or 1.0 E.U./dL   Nitrite, UA negative    Leukocytes, UA Negative Negative   Appearance     Odor       Results for orders placed or performed in visit on 12/04/19  Urine Culture   Specimen: Urine  Result Value Ref Range   MICRO NUMBER: [id number]    SPECIMEN QUALITY: Adequate    Sample Source NOT GIVEN    STATUS: FINAL    ISOLATE 1: ESBL Escherichia coli (A)       Susceptibility   Esbl escherichia coli - URINE CULTURE, REFLEX    AMOX/CLAVULANIC >=32 Resistant       AMPICILLIN* >=32 Resistant      * Extended spectrum beta-lactamase (ESBL) producingorganisms demonstrate decreased activity withpenicillins, cephalosporins and aztreonam.    AMPICILLIN/SULBACTAM >=32 Resistant     CEFAZOLIN* >=64 Resistant      * Extended spectrum beta-lactamase (ESBL) producingorganisms demonstrate decreased activity withpenicillins, cephalosporins and aztreonam.For uncomplicated UTI caused by E. coli,K. pneumoniae or P. mirabilis: Cefazolin issusceptible if MIC <32 mcg/mL and predictssusceptible to the oral agents cefaclor, cefdinir,cefpodoxime, cefprozil, cefuroxime, cephalexinand loracarbef.    CEFEPIME 16 Resistant     CEFTRIAXONE >=64 Resistant     CIPROFLOXACIN >=4 Resistant     LEVOFLOXACIN >=8 Resistant     ERTAPENEM <=0.5 Sensitive     IMIPENEM <=0.25 Sensitive     NITROFURANTOIN <=16 Sensitive     PIP/TAZO 8 Sensitive     TRIMETH/SULFA* <=20 Sensitive      * Extended spectrum beta-lactamase (ESBL) producingorganisms demonstrate decreased activity withpenicillins, cephalosporins and aztreonam.For uncomplicated UTI caused by E. coli,K. pneumoniae or P. mirabilis: Cefazolin issusceptible if MIC <32 mcg/mL and predictssusceptible to the oral agents cefaclor, cefdinir,cefpodoxime, cefprozil, cefuroxime, cephalexinand loracarbef.Legend:S = Susceptible  I = IntermediateR = Resistant  NS = Not susceptible* = Not tested  NR = Not reported**NN = See antimicrobic comments  POCT urinalysis dipstick  Result Value Ref Range   Color, UA dark amber    Clarity, UA cloudy    Glucose, UA Negative Negative   Bilirubin, UA Negative    Ketones, UA Negative    Spec Grav, UA 1.010 1.010 - 1.025   Blood, UA large    pH, UA 5.0 5.0 - 8.0   Protein, UA Negative Negative   Urobilinogen, UA 0.2 0.2 or 1.0 E.U./dL   Nitrite, UA Negative    Leukocytes, UA Negative Negative   Appearance     Odor        Assessment & Plan:   Problem List Items Addressed This Visit    None    Visit  Diagnoses    Urinary tract infection due to extended-spectrum beta lactamase (ESBL) producing Escherichia coli    -  Primary   Relevant Medications   nitrofurantoin, macrocrystal-monohydrate, (MACROBID) 100 MG capsule   Other Relevant Orders   POCT  Urinalysis Dipstick   Urine Culture   Ambulatory referral to Urology   Chronic UTI       Relevant Medications   nitrofurantoin, macrocrystal-monohydrate, (MACROBID) 100 MG capsule   Other Relevant Orders   Ambulatory referral to Urology     Possible recurrent episode of acute cystitis, recent complicated UTi with ESBL E Coli 11/2019 culture, about 2-3 weeks ago. Treated with Bactrim DS 7 day with near resolution but seems to have returned. Could also be re-infection. - In past had incomplete resolution on keflex in 2020 on prior UTI  Plan Check Urine dipstick Check Urine culture Start Empiric Macrobid BID x 7 days today Refer to [facility] due to recurrent UTI / incomplete resolution Return precautions given when to go to hospital if not improve or worse   Orders Placed This Encounter  Procedures  . Urine Culture  . Ambulatory referral to Urology    Referral Priority:   Routine    Referral Type:   Consultation    Referral Reason:   Specialty Services Required    Requested Specialty:   Urology    Number of Visits Requested:   1  . POCT Urinalysis Dipstick      Meds ordered this encounter  Medications  . nitrofurantoin, macrocrystal-monohydrate, (MACROBID) 100 MG capsule    Sig: Take 1 capsule (100 mg total) by mouth 2 (two) times daily. For 7 days    Dispense:  14 capsule    Refill:  0     Follow up plan: Return if symptoms worsen or fail to improve, for UTI.   Nobie Putnam, [facility] 12/21/2019, 9:45 AM

## 2019-12-21 NOTE — Patient Instructions (Addendum)
Thank you for coming to the office today.   Referral to [facility] -[address],  [place]  [address] Phone: [phone]  Start Macrobid antibiotic twice a day for 7 days.  We may call you to switch it.  If severely worsening fever chills higher back or flank pain, cannot urinate, nausea vomiting cannot take med, then seek care at hospital.   Please schedule a Follow-up Appointment to: Return if symptoms worsen or fail to improve, for UTI.  If you have any other questions or concerns, please feel free to call the office or send a message through [place]. You may also schedule an earlier appointment if necessary.  Additionally, you may be receiving a survey about your experience at our office within a few days to 1 week by e-mail or mail. We value your feedback.  Nobie Putnam, DO [place]

## 2019-12-22 LAB — URINE CULTURE
MICRO NUMBER:: 10174262
Result:: NO GROWTH
SPECIMEN QUALITY:: ADEQUATE

## 2019-12-29 ENCOUNTER — Telehealth: Payer: Self-pay | Admitting: Family Medicine

## 2019-12-30 ENCOUNTER — Other Ambulatory Visit: Payer: Self-pay | Admitting: Family Medicine

## 2019-12-30 DIAGNOSIS — J432 Centrilobular emphysema: Secondary | ICD-10-CM

## 2020-01-06 ENCOUNTER — Other Ambulatory Visit: Payer: Self-pay | Admitting: Internal Medicine

## 2020-01-14 ENCOUNTER — Encounter: Payer: Self-pay | Admitting: Urology

## 2020-01-14 ENCOUNTER — Ambulatory Visit: Payer: Medicare HMO | Admitting: Urology

## 2020-01-30 ENCOUNTER — Other Ambulatory Visit: Payer: Self-pay | Admitting: Family Medicine

## 2020-01-30 DIAGNOSIS — L239 Allergic contact dermatitis, unspecified cause: Secondary | ICD-10-CM

## 2020-01-30 DIAGNOSIS — I10 Essential (primary) hypertension: Secondary | ICD-10-CM

## 2020-02-10 NOTE — Telephone Encounter (Signed)
error 

## 2020-02-17 ENCOUNTER — Ambulatory Visit: Payer: Medicare HMO | Admitting: Family

## 2020-02-17 NOTE — Progress Notes (Deleted)
Office Visit    Patient Name: Kristin Parker Date of Encounter: 02/17/2020  Primary Care Provider:  Olin Hauser, DO Primary Cardiologist:  No primary care provider on file. Electrophysiologist:  None   Chief Complaint    Kristin Parker is a 69 y.o. female with a hx of CAD, carotid artery stenosis, hypertension, HLD, depression, anxiety, chronic shortness of breath, tobacco use presents today for follow up of her CAD.   Past Medical History    Past Medical History:  Diagnosis Date  . Anxiety   . Aortic valve sclerosis   . Bilateral carotid bruits   . Carotid artery stenosis   . Hypertension   . Mitral regurgitation   . Mixed hyperlipidemia   . Shoulder pain   . Sleeping difficulties    Past Surgical History:  Procedure Laterality Date  . BREAST BIOPSY Left 08/13/2018   affirm bx, x clip, path pending  . CATARACT EXTRACTION Left    2017  . COLONOSCOPY  2012   Dr Candace Cruise  . LEFT HEART CATH AND CORONARY ANGIOGRAPHY N/A 06/25/2017   Procedure: LEFT HEART CATH AND CORONARY ANGIOGRAPHY;  Surgeon: [name], MD;  Location: [facility];  Service: Cardiovascular;  Laterality: N/A;    Allergies  Allergies  Allergen Reactions  . Bactrim [Sulfamethoxazole-Trimethoprim] Nausea And Vomiting  . Aleve [Naproxen Sodium] Swelling    Lips. But patient can take Ibuprofen without problems    History of Present Illness    Kristin Parker is a 69 y.o. female with a hx of CAD, carotid artery stenosis, hypertension, HLD, depression, anxiety, chronic shortness of breath, tobacco use. She was last seen 07/22/19 by Dr. Saunders Revel.  Echo 02/2017 with LVEF 60-65%, gr1DD, mild MR, normal PASP. Carotid duplex 02/2018 with 40-59% RICA stenosis and 123456 LICA stenosis.  Cardiac cath 05/2017 with up to 40% stenosis of distal LMCA. She has a longstanding history of dyspnea on exertion.   At last office visit 07/22/19 she noted pain from shoulders through knees relieved by  Meloxicam, stable exertional dyspnea, occasional flutters in her chest, and brief orthostatic lightheadedness with no falls. She was given a statin holiday of one month to see if her myalgias improved.   ***  EKGs/Labs/Other Studies Reviewed:   The following studies were reviewed today: *** LHC (06/25/17): Mild to moderate CAD, including 40% distal LMCA, 20% ostial LAD, 20-30% mid LAD, and sequential 30% proximal and mid LCx stenoses.  Small, nondominant RCA without significant disease.  Normal left ventricular filling pressure.   ETT (06/18/17): Abnormal exercise tolerance test, the target heart rate not achieved.  1-2 mm horizontal ST depressions noted in inferior leads.  Patient only achieved 84% MPHR due to shortness of breath and fatigue  Carotid artery Duplex (03/01/17): Smooth plaque, bilaterally. 40-59% RICA stenosis. 123456 LICA stenosis. Patent vertebral arteries with antegrade flow. Normal subclavian arteries, bilaterally  TTE (03/01/17): Normal LV size and function with LVEF 60-65% and grade 1 diastolic dysfunction. Mild MR. Normal RV size and function. Normal PA pressure.  EKG:  EKG is ordered today.  The ekg ordered today demonstrates ***  Recent Labs: 07/22/2019: ALT 11; BUN 11; Creatinine, Ser 0.88; Hemoglobin 14.6; Platelets 267; Potassium 3.8; Sodium 139; TSH 2.260  Recent Lipid Panel    Component Value Date/Time   CHOL 135 07/22/2019 1034   TRIG 175 (H) 07/22/2019 1034   HDL 43 07/22/2019 1034   CHOLHDL 3.1 07/22/2019 1034   CHOLHDL 3.7 06/18/2018 0802  VLDL NOT CALCULATED 05/28/2017 1008   LDLCALC 63 07/22/2019 1034   LDLCALC 95 06/18/2018 0802   LDLDIRECT 62 05/28/2017 1008    Home Medications   No outpatient medications have been marked as taking for the 02/17/20 encounter (Appointment) with Loel Dubonnet, NP.      Review of Systems    ***   ROS All other systems reviewed and are otherwise negative except as noted above.  Physical Exam    VS:  There  were no vitals taken for this visit. , BMI There is no height or weight on file to calculate BMI. GEN: Well nourished, well developed, in no acute distress. HEENT: normal. Neck: Supple, no JVD, carotid bruits, or masses. Cardiac: ***RRR, no murmurs, rubs, or gallops. No clubbing, cyanosis, edema.  ***Radials/DP/PT 2+ and equal bilaterally.  Respiratory:  ***Respirations regular and unlabored, clear to auscultation bilaterally. GI: Soft, nontender, nondistended, BS + x 4. MS: No deformity or atrophy. Skin: Warm and dry, no rash. Neuro:  Strength and sensation are intact. Psych: Normal affect.  Accessory Clinical Findings    ECG personally reviewed by me today - *** - no acute changes.  Assessment & Plan    1. CAD - Cath 2018 with up to 40% LM stenosis. *** 2. HTN -  3. Fatigue - *** 4. HLD, LDL goal, < 70 - 06/2019 LDL 63, triglycerides 175, HDL 43, total cholesterol 135. *** 5. Tobacco use -  6. Carotid artery stenosis - Mild to moderate disease by duplex in 2018. Repeat duplex 2018 showed bilateral ICA with 1-39% stenosis.   Disposition: Follow up {follow up:15908} with ***   Loel Dubonnet, NP 02/17/2020, 12:56 PM

## 2020-02-22 ENCOUNTER — Telehealth (INDEPENDENT_AMBULATORY_CARE_PROVIDER_SITE_OTHER): Payer: Medicare HMO | Admitting: Family

## 2020-02-22 ENCOUNTER — Encounter: Payer: Self-pay | Admitting: Family

## 2020-02-22 ENCOUNTER — Other Ambulatory Visit: Payer: Self-pay

## 2020-02-22 ENCOUNTER — Telehealth: Payer: Self-pay | Admitting: Family Medicine

## 2020-02-22 ENCOUNTER — Telehealth: Payer: Self-pay

## 2020-02-22 VITALS — BP 118/60 | HR 54 | Ht 67.0 in | Wt 152.0 lb

## 2020-02-22 DIAGNOSIS — I10 Essential (primary) hypertension: Secondary | ICD-10-CM | POA: Diagnosis not present

## 2020-02-22 DIAGNOSIS — E782 Mixed hyperlipidemia: Secondary | ICD-10-CM

## 2020-02-22 DIAGNOSIS — I25118 Atherosclerotic heart disease of native coronary artery with other forms of angina pectoris: Secondary | ICD-10-CM

## 2020-02-22 DIAGNOSIS — Z720 Tobacco use: Secondary | ICD-10-CM | POA: Diagnosis not present

## 2020-02-22 DIAGNOSIS — I6523 Occlusion and stenosis of bilateral carotid arteries: Secondary | ICD-10-CM

## 2020-02-22 NOTE — Progress Notes (Signed)
Virtual Visit via Telephone Note   This visit type was conducted due to national recommendations for restrictions regarding the COVID-19 Pandemic (e.g. social distancing) in an effort to limit this patient's exposure and mitigate transmission in our community.  Due to her co-morbid illnesses, this patient is at least at moderate risk for complications without adequate follow up.  This format is felt to be most appropriate for this patient at this time.  The patient did not have access to video technology/had technical difficulties with video requiring transitioning to audio format only (telephone).  All issues noted in this document were discussed and addressed.  No physical exam could be performed with this format.  Please refer to the patient's chart for her  consent to telehealth for [facility].   The patient was identified using 2 identifiers.  Date:  02/22/2020   ID:  Kristin Parker, DOB April 14, 1951, MRN [id number]  Patient Location: Home Provider Location: Office  PCP:  Olin Hauser, DO  Cardiologist:  [name], MD  Electrophysiologist:  None   Evaluation Performed:  Follow-Up Visit  Chief Complaint:  Follow up of CAD, HLD  History of Present Illness:    Kristin Parker is a 69 y.o. female with CAD (up to 40% distal LMCA by cath 05/2017), carotid artery stenosis, HTN, HLD, depression, anxiety, short chronic shortness of breath, tobacco use.  She does not see 07/21/2019 by Dr. Saunders Revel.   Echo 02/2017 with LVEF 60-65%, gr1DD, mild MR, normal PASP. Carotid duplex 02/2018 with 40-59% RICA stenosis and 123456 LICA stenosis.  Cardiac cath 05/2017 with up to 40% stenosis of distal LMCA. She has a longstanding history of dyspnea on exertion.    At last office visit 07/22/19 she noted pain from shoulders through knees relieved by Meloxicam, stable exertional dyspnea, occasional flutters in her chest, and brief orthostatic lightheadedness with no falls. She was given a statin  holiday of one month to see if her myalgias improved.   She had a repeat carotid duplex 10/08/2019 bilateral ICA with 1-39% stenosis.  Only gets lightheaded or dizzy with dehydration. 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   Complains of some chest tightness and shortness of breath. She attributes her chest tightness to anxiety, she tells me she lays down and to relax and it goes away. Tells me this has been happening for a long time and is stable. Does use hydroxizine at bedtime as directed by her PCP. We discussed stress reduction techniques and deep breathing exercises.   She continues to have dyspnea on exertion if she walks a long distance or does something "really hard". Tells me this is about the same. She does stay active working in her yard.  Sometimes has trouble sleeping and had difficulty last night. No orthopnea, PND. Tells me she wakes up and has difficulty falling asleep.   Tells me she felt "entirely different" after stopping her statin. Her myalgias have since resolved. She remains off statin. She remains on Zetia 10mg  daily with no adverse effects.    The patient does not have symptoms concerning for COVID-19 infection (fever, chills, cough, or new shortness of breath).    Past Medical History:  Diagnosis Date  . Anxiety   . Aortic valve sclerosis   . Bilateral carotid bruits   . Carotid artery stenosis   . Hypertension   . Mitral regurgitation   . Mixed hyperlipidemia   . Shoulder pain   . Sleeping difficulties    Past Surgical  History:  Procedure Laterality Date  . BREAST BIOPSY Left 08/13/2018   affirm bx, x clip, path pending  . CATARACT EXTRACTION Left    2017  . COLONOSCOPY  2012   Dr Candace Cruise  . LEFT HEART CATH AND CORONARY ANGIOGRAPHY N/A 06/25/2017   Procedure: LEFT HEART CATH AND CORONARY ANGIOGRAPHY;  Surgeon: [name], MD;  Location: [facility];  Service: Cardiovascular;  Laterality: N/A;     Current Meds  Medication Sig  .  albuterol (VENTOLIN HFA) 108 (90 Base) MCG/ACT inhaler INHALE 2 PUFFS EVERY 4 HOURS FOR WHEEZING OR SHORTNESS OF BREATH AS NEEDED  . amLODipine (NORVASC) 10 MG tablet TAKE 1 TABLET BY MOUTH ONCE DAILY  . aspirin EC 81 MG tablet Take 1 tablet (81 mg total) by mouth daily.  . cetirizine (ZYRTEC) 10 MG tablet Take 10 mg by mouth daily.  Marland Kitchen ezetimibe (ZETIA) 10 MG tablet TAKE 1 TABLET BY MOUTH ONCE DAILY  . fluticasone (FLONASE) 50 MCG/ACT nasal spray USE 2 SPRAYS INTO EACH NOSTRIL ONCE DAILY  . hydrOXYzine (ATARAX/VISTARIL) 10 MG tablet TAKE 1 TABLET BY MOUTH AT BEDTIME AS NEEDED ANXIETY/SLEEP  . isosorbide mononitrate (IMDUR) 60 MG 24 hr tablet TAKE 1 TABLET BY MOUTH ONCE DAILY  . losartan (COZAAR) 100 MG tablet TAKE 1 TABLET BY MOUTH ONCE DAILY  . meloxicam (MOBIC) 15 MG tablet TAKE 1 TABLET BY MOUTH ONCE DAILY AS NEEDED FOR PAIN WITH FOOD  . Omega-3 Fatty Acids (FISH OIL) 1000 MG CAPS Take 1 capsule (1,000 mg total) by mouth 2 (two) times daily. (Patient taking differently: Take 1 capsule by mouth daily. )  . sertraline (ZOLOFT) 100 MG tablet TAKE 1 TABLET BY MOUTH ONCE DAILY  . SPIRIVA HANDIHALER 18 MCG inhalation capsule INHALE CONTENTS ON ONE CAPSULE BY MOUTH ONCE A DAY USING HANDIHALER     Allergies:   Bactrim [sulfamethoxazole-trimethoprim] and Aleve [naproxen sodium]   Social History   Tobacco Use  . Smoking status: Current Every Day Smoker    Packs/day: 0.50    Years: 30.00    Pack years: 15.00    Types: Cigarettes  . Smokeless tobacco: Current User  . Tobacco comment: not quite ready due to covid-19   Substance Use Topics  . Alcohol use: No    Alcohol/week: 0.0 standard drinks  . Drug use: No     Family Hx: The patient's family history includes Cancer in her father and mother; Heart disease (age of onset: 45) in her mother; Multiple sclerosis in her sister; Valvular heart disease (age of onset: 42) in her brother. There is no history of Breast cancer.  ROS:   Please see  the history of present illness.    Review of Systems  Constitution: Negative for chills, fever and malaise/fatigue.  Cardiovascular: Positive for dyspnea on exertion. Negative for chest pain, leg swelling, near-syncope, orthopnea, palpitations and syncope.  Respiratory: Negative for cough, shortness of breath and wheezing.   Gastrointestinal: Negative for nausea and vomiting.  Neurological: Negative for dizziness, light-headedness and weakness.   All other systems reviewed and are negative.  Prior CV studies:   The following studies were reviewed today:  Carotid Duplex 10/08/19: Right Carotid: Velocities in the right ICA are consistent with a 1-39% stenosis. Non-hemodynamically significant plaque <50% noted in the  CCA. The ECA appears <50% stenosed. Left Carotid: Velocities in the left ICA are consistent with a 1-39% stenosis. Non-hemodynamically significant plaque <50% noted in the CCA. The ECA appears <50% stenosed.  LHC (06/25/17): Mild to moderate CAD, including 40% distal LMCA, 20% ostial LAD, 20-30% mid LAD, and sequential 30% proximal and mid LCx stenoses.  Small, nondominant RCA without significant disease.  Normal left ventricular filling pressure.   ETT (06/18/17): Abnormal exercise tolerance test, the target heart rate not achieved.  1-2 mm horizontal ST depressions noted in inferior leads.  Patient only achieved 84% MPHR due to shortness of breath and fatigue  Carotid artery Duplex (03/01/17): Smooth plaque, bilaterally. 40-59% RICA stenosis. 123456 LICA stenosis. Patent vertebral arteries with antegrade flow. Normal subclavian arteries, bilaterally.  TTE (03/01/17): Normal LV size and function with LVEF 60-65% and grade 1 diastolic dysfunction. Mild MR. Normal RV size and function. Normal PA pressure.   Labs/Other Tests and Data Reviewed:    EKG:  An ECG dated 07/22/19 was personally reviewed today and demonstrated:  NSR 63 bpm with low voltage and nonspecific ST changes.  Recent  Labs: 07/22/2019: ALT 11; BUN 11; Creatinine, Ser 0.88; Hemoglobin 14.6; Platelets 267; Potassium 3.8; Sodium 139; TSH 2.260   Recent Lipid Panel Lab Results  Component Value Date/Time   CHOL 135 07/22/2019 10:34 AM   TRIG 175 (H) 07/22/2019 10:34 AM   HDL 43 07/22/2019 10:34 AM   CHOLHDL 3.1 07/22/2019 10:34 AM   CHOLHDL 3.7 06/18/2018 08:02 AM   LDLCALC 63 07/22/2019 10:34 AM   LDLCALC 95 06/18/2018 08:02 AM   LDLDIRECT 62 05/28/2017 10:08 AM    Wt Readings from Last 3 Encounters:  02/22/20 152 lb (68.9 kg)  12/21/19 154 lb (69.9 kg)  12/04/19 151 lb 3.2 oz (68.6 kg)     Objective:    Vital Signs:  BP 118/60 (BP Location: Left Arm, Patient Position: Sitting, Cuff Size: Normal)   Pulse (!) 54   Ht 5\' 7"  (1.702 m)   Wt 152 lb (68.9 kg)   BMI 23.81 kg/m    VITAL SIGNS:  reviewed  ASSESSMENT & PLAN:    1. CAD - She has stable exertional dyspnea that is unchanged. Reports very intermittent self-resolving 'chest tightness' which she attributes to anxiety. No indication for ischemic evaluation at this time. Continue GDMT aspirin, Imdur. No statin secondary to myalgias, as below. No beta blocker secondary to resting bradycardia.   2. HTN - BP well controlled today. Tells me she only gets lightheaded if she is dehydrated. She is careful to stay well hydrated. Continue present antihypertensive regimen.   3. HLD, LDL goal <70 - Lipid panel 07/22/19 with total cholesterol 135, HDL 43, triglycerides 175, LDL 63. At last visit 06/2019 was recommended for 'statin holiday' from her Crestor 40mg . She had marked improvement in her myalgias and did not resume statin. She continues on Zetia 10mg  daily and tolerates this well. I have reached out to her primary care who assisted to schedule lipid panel & CMET at their office as it is easier for her to travel to. Pending results, consider trial of low dose Crestor vs [id number] vs Nexlizet - may benefit from referral to lipid clinic.   4. Tobacco abuse  - Smoking cessation encouraged. Tells me she does not think she can quit due to stress of the pandemic. Recommend utilization of 1800QUITNOW. Smoking about 0.5 PPD.   5. Carotid artery stenosis - Carotid duplex 09/2019 with bilateral 1-39% stenosis. She is asymptomatic with no amaurosis fugax. Not presently on statin secondary to intolerance. Plan for cholesterol management, as above.   6. DOE - Reports this is stable at her baseline. Only occurs  if she does "a lot more than I should". Likely multifactorial deconditioning, chronic stable angina. She is aware to report new or worsening shortness of breath.   COVID-19 Education: The signs and symptoms of COVID-19 were discussed with the patient and how to seek care for testing (follow up with PCP or arrange E-visit). The importance of social distancing was discussed today.  Time:   Today, I have spent 15 minutes with the patient with telehealth technology discussing the above problems.     Medication Adjustments/Labs and Tests Ordered: Current medicines are reviewed at length with the patient today.  Concerns regarding medicines are outlined above.   Tests Ordered: No orders of the defined types were placed in this encounter.   Medication Changes: No orders of the defined types were placed in this encounter.   Follow Up: Lipid panel & CMET to be collected at primary care. Follow up In Person in 6 month(s) with Dr. Saunders Revel or APP.   Signed, Loel Dubonnet, NP  02/22/2020 12:58 PM    [facility]

## 2020-02-22 NOTE — Patient Instructions (Signed)
Medication Instructions:  Continue your current medications.  *If you need a refill on your cardiac medications before your next appointment, please call your pharmacy*   Lab Work: Laurann Montana, NP will talk with Dr. Parks Ranger to see if we can get you set up for labs with their office to re-check your cholesterol. We will contact you once the details have been arranged.   Testing/Procedures: None ordered today.   Your recent carotid duplex (ultrasound) looked good! It showed only mild bilateral stenosis (stiffening) so we will continue to focus on controlling your cholesterol to keep this well controlled.  Follow-Up: At [facility], you and your health needs are our priority.  As part of our continuing mission to provide you with exceptional heart care, we have created designated Provider Care Teams.  These Care Teams include your primary Cardiologist (physician) and Advanced Practice Providers (APPs -  Physician Assistants and Nurse Practitioners) who all work together to provide you with the care you need, when you need it.  We recommend signing up for the patient portal called "MyChart".  Sign up information is provided on this After Visit Summary.  MyChart is used to connect with patients for Virtual Visits (Telemedicine).  Patients are able to view lab/test results, encounter notes, upcoming appointments, etc.  Non-urgent messages can be sent to your provider as well.   To learn more about what you can do with MyChart, go to [email or web address].    Your next appointment:   6 month(s)  The format for your next appointment:   In Person  Provider:   You may see [name], MD or one of the following Advanced Practice Providers on your designated Care Team:    Murray Hodgkins, NP  Christell Faith, PA-C  Marrianne Mood, PA-C  Other Instructions  When you have episodes of anxiety or worry, recommend using deep breathing exercises. Take a deep breath in for 4 counts,  hold your breath for 4 counts, then breath out as if you are breathing through a straw for 8 counts.   If you have new or worsening shortness of breath or chest pain don't hesitate to contact us.   Be sure to stay well hydrated. Keep drinking the [employer] as you have been. Be sure to make position changes slowly to prevent getting lightheaded. You can also wear compression stockings.

## 2020-02-22 NOTE — Telephone Encounter (Signed)
Please notify patient that she can come in for a FASTING Lab Only apt anytime in next 1-2 weeks at our office.  Orders are in for chemistry CMET and Lipid panel.  I have discussed this with her Cardiology Team, and they can review the results as well.  CC'd note to Laurann Montana NP   Nobie Putnam, [facility] 02/22/2020, 12:05 PM

## 2020-02-22 NOTE — Telephone Encounter (Signed)
     Patient Consent for Virtual Visit     {  Kristin Parker has provided verbal consent on 02/22/2020 for a virtual visit (video or telephone).   CONSENT FOR VIRTUAL VISIT FOR:  Kristin Parker  By participating in this virtual visit I agree to the following:  I hereby voluntarily request, consent and authorize [facility] and its employed or contracted physicians, physician assistants, nurse practitioners or other licensed health care professionals (the Practitioner), to provide me with telemedicine health care services (the "Services") as deemed necessary by the treating Practitioner. I acknowledge and consent to receive the Services by the Practitioner via telemedicine. I understand that the telemedicine visit will involve communicating with the Practitioner through live audiovisual communication technology and the disclosure of certain medical information by electronic transmission. I acknowledge that I have been given the opportunity to request an in-person assessment or other available alternative prior to the telemedicine visit and am voluntarily participating in the telemedicine visit.  I understand that I have the right to withhold or withdraw my consent to the use of telemedicine in the course of my care at any time, without affecting my right to future care or treatment, and that the Practitioner or I may terminate the telemedicine visit at any time. I understand that I have the right to inspect all information obtained and/or recorded in the course of the telemedicine visit and may receive copies of available information for a reasonable fee.  I understand that some of the potential risks of receiving the Services via telemedicine include:  Marland Kitchen Delay or interruption in medical evaluation due to technological equipment failure or disruption; . Information transmitted may not be sufficient (e.g. poor resolution of images) to allow for appropriate medical decision making by the  Practitioner; and/or  . In rare instances, security protocols could fail, causing a breach of personal health information.  Furthermore, I acknowledge that it is my responsibility to provide information about my medical history, conditions and care that is complete and accurate to the best of my ability. I acknowledge that Practitioner's advice, recommendations, and/or decision may be based on factors not within their control, such as incomplete or inaccurate data provided by me or distortions of diagnostic images or specimens that may result from electronic transmissions. I understand that the practice of medicine is not an exact science and that Practitioner makes no warranties or guarantees regarding treatment outcomes. I acknowledge that a copy of this consent can be made available to me via my patient portal ([place]), or I can request a printed copy by calling the office of [facility].    I understand that my insurance will be billed for this visit.   I have read or had this consent read to me. . I understand the contents of this consent, which adequately explains the benefits and risks of the Services being provided via telemedicine.  . I have been provided ample opportunity to ask questions regarding this consent and the Services and have had my questions answered to my satisfaction. . I give my informed consent for the services to be provided through the use of telemedicine in my medical care

## 2020-02-22 NOTE — Telephone Encounter (Signed)
Unable to leave any message no VM set.

## 2020-02-23 NOTE — Telephone Encounter (Signed)
Unable to reach the patient a letter has been mailed.

## 2020-04-09 ENCOUNTER — Other Ambulatory Visit: Payer: Self-pay | Admitting: Family Medicine

## 2020-04-09 ENCOUNTER — Other Ambulatory Visit: Payer: Self-pay | Admitting: Internal Medicine

## 2020-04-09 DIAGNOSIS — F419 Anxiety disorder, unspecified: Secondary | ICD-10-CM

## 2020-04-09 DIAGNOSIS — J432 Centrilobular emphysema: Secondary | ICD-10-CM

## 2020-04-09 DIAGNOSIS — J3089 Other allergic rhinitis: Secondary | ICD-10-CM

## 2020-04-09 DIAGNOSIS — G8929 Other chronic pain: Secondary | ICD-10-CM

## 2020-04-09 NOTE — Telephone Encounter (Signed)
Requested Prescriptions  Pending Prescriptions Disp Refills   albuterol (VENTOLIN HFA) 108 (90 Base) MCG/ACT inhaler [Pharmacy Med Name: ALBUTEROL SULFATE HFA 108 (90 BASE)] 18 g 2    Sig: INHALE 2 PUFFS EVERY 4 HOURS FOR WHEEZING OR SHORTNESS OF BREATH AS NEEDED     Pulmonology:  Beta Agonists Failed - 04/09/2020 12:01 PM      Failed - One inhaler should last at least one month. If the patient is requesting refills earlier, contact the patient to check for uncontrolled symptoms.      Passed - Valid encounter within last 12 months    Recent Outpatient Visits          3 months ago Urinary tract infection due to extended-spectrum beta lactamase (ESBL) producing Escherichia coli   [place], DO   4 months ago Acute cystitis without hematuria   [place], DO   1 year ago Urinary tract infection due to extended-spectrum beta lactamase (ESBL) producing Escherichia coli   [place] Olin Hauser, DO   1 year ago Acute cystitis with hematuria   [place], DO   1 year ago Annual physical exam   [facility], Devonne Doughty, DO              meloxicam (MOBIC) 15 MG tablet [Pharmacy Med Name: MELOXICAM 15 MG TAB] 30 tablet 5    Sig: TAKE 1 TABLET BY MOUTH ONCE DAILY AS NEEDED FOR PAIN WITH FOOD     Analgesics:  COX2 Inhibitors Passed - 04/09/2020 12:01 PM      Passed - HGB in normal range and within 360 days    Hemoglobin  Date Value Ref Range Status  07/22/2019 14.6 11.1 - 15.9 g/dL Final         Passed - Cr in normal range and within 360 days    Creat  Date Value Ref Range Status  06/18/2018 0.91 0.50 - 0.99 mg/dL Final    Comment:    For patients >13 years of age, the reference limit for Creatinine is approximately 13% higher for people identified as African-American. .    Creatinine, Ser  Date  Value Ref Range Status  07/22/2019 0.88 0.57 - 1.00 mg/dL Final         Passed - Patient is not pregnant      Passed - Valid encounter within last 12 months    Recent Outpatient Visits          3 months ago Urinary tract infection due to extended-spectrum beta lactamase (ESBL) producing Escherichia coli   [place], DO   4 months ago Acute cystitis without hematuria   [place], DO   1 year ago Urinary tract infection due to extended-spectrum beta lactamase (ESBL) producing Escherichia coli   [place], DO   1 year ago Acute cystitis with hematuria   [place], DO   1 year ago Annual physical exam   [place], DO              fluticasone [facility]) 50 MCG/ACT nasal spray [Pharmacy Med Name: FLUTICASONE PROPIONATE 50 MCG/ACT N] 16 g 5    Sig: USE 2 SPRAYS INTO EACH NOSTRIL ONCE DAILY     Ear, Nose, and  Throat: Nasal Preparations - Corticosteroids Passed - 04/09/2020 12:01 PM      Passed - Valid encounter within last 12 months    Recent Outpatient Visits          3 months ago Urinary tract infection due to extended-spectrum beta lactamase (ESBL) producing Escherichia coli   [place], DO   4 months ago Acute cystitis without hematuria   [place], DO   1 year ago Urinary tract infection due to extended-spectrum beta lactamase (ESBL) producing Escherichia coli   [employer] [facility] Olin Hauser, DO   1 year ago Acute cystitis with hematuria   [facility] Olin Hauser, DO   1 year ago Annual physical exam   [facility], Devonne Doughty, DO              sertraline (ZOLOFT) 100 MG tablet [Pharmacy Med Name:  SERTRALINE HCL 100 MG TAB] 90 tablet 1    Sig: TAKE 1 TABLET BY MOUTH ONCE DAILY     Psychiatry:  Antidepressants - SSRI Passed - 04/09/2020 12:01 PM      Passed - Valid encounter within last 6 months    Recent Outpatient Visits          3 months ago Urinary tract infection due to extended-spectrum beta lactamase (ESBL) producing Escherichia coli   [place], DO   4 months ago Acute cystitis without hematuria   [place], DO   1 year ago Urinary tract infection due to extended-spectrum beta lactamase (ESBL) producing Escherichia coli   [place], DO   1 year ago Acute cystitis with hematuria   [place], DO   1 year ago Annual physical exam   [place], Devonne Doughty, DO

## 2020-05-24 ENCOUNTER — Encounter: Payer: Self-pay | Admitting: Family Medicine

## 2020-06-11 ENCOUNTER — Other Ambulatory Visit: Payer: Self-pay | Admitting: Internal Medicine

## 2020-06-11 ENCOUNTER — Other Ambulatory Visit: Payer: Self-pay | Admitting: Family Medicine

## 2020-06-11 DIAGNOSIS — J432 Centrilobular emphysema: Secondary | ICD-10-CM

## 2020-06-11 NOTE — Telephone Encounter (Signed)
Requested Prescriptions  Pending Prescriptions Disp Refills  . SPIRIVA HANDIHALER 18 MCG inhalation capsule [Pharmacy Med Name: SPIRIVA HANDIHALER 18 MCG INH CAP] 30 capsule 11    Sig: INHALE CONTENTS OF 1 CAPSULE ONCE DAILY     Pulmonology:  Anticholinergic Agents Passed - 06/11/2020 12:00 PM      Passed - Valid encounter within last 12 months    Recent Outpatient Visits          5 months ago Urinary tract infection due to extended-spectrum beta lactamase (ESBL) producing Escherichia coli   [place], DO   6 months ago Acute cystitis without hematuria   [place], DO   1 year ago Urinary tract infection due to extended-spectrum beta lactamase (ESBL) producing Escherichia coli   [place], DO   1 year ago Acute cystitis with hematuria   [place], DO   1 year ago Annual physical exam   [place], Devonne Doughty, DO

## 2020-07-08 ENCOUNTER — Other Ambulatory Visit: Payer: Self-pay | Admitting: Family Medicine

## 2020-07-08 ENCOUNTER — Other Ambulatory Visit: Payer: Self-pay | Admitting: Internal Medicine

## 2020-07-08 DIAGNOSIS — L239 Allergic contact dermatitis, unspecified cause: Secondary | ICD-10-CM

## 2020-07-08 DIAGNOSIS — I10 Essential (primary) hypertension: Secondary | ICD-10-CM

## 2020-07-08 NOTE — Telephone Encounter (Signed)
Requested  medications are  due for refill today yes  Requested medications are on the active medication list yes  Last refill 6/12  Last visit 05/2018 last viisit that adderssed all issues, not on BP meds then Future visit scheduled no  Notes to clinic Failed protocol of valid visit within 6 months, please assess.

## 2020-08-05 ENCOUNTER — Other Ambulatory Visit: Payer: Self-pay | Admitting: Family Medicine

## 2020-08-05 DIAGNOSIS — I10 Essential (primary) hypertension: Secondary | ICD-10-CM

## 2020-08-05 DIAGNOSIS — J432 Centrilobular emphysema: Secondary | ICD-10-CM

## 2020-08-05 DIAGNOSIS — L239 Allergic contact dermatitis, unspecified cause: Secondary | ICD-10-CM

## 2020-08-05 NOTE — Telephone Encounter (Signed)
Courtesy refill  

## 2020-08-23 DIAGNOSIS — H2511 Age-related nuclear cataract, right eye: Secondary | ICD-10-CM | POA: Diagnosis not present

## 2020-09-02 ENCOUNTER — Other Ambulatory Visit: Payer: Self-pay | Admitting: Family Medicine

## 2020-09-02 DIAGNOSIS — J432 Centrilobular emphysema: Secondary | ICD-10-CM

## 2020-09-02 DIAGNOSIS — L239 Allergic contact dermatitis, unspecified cause: Secondary | ICD-10-CM

## 2020-09-02 DIAGNOSIS — I10 Essential (primary) hypertension: Secondary | ICD-10-CM

## 2020-09-02 NOTE — Telephone Encounter (Signed)
Requested Prescriptions  Pending Prescriptions Disp Refills   losartan (COZAAR) 100 MG tablet [Pharmacy Med Name: LOSARTAN POTASSIUM 100 MG TAB] 30 tablet 0    Sig: TAKE 1 TABLET BY MOUTH ONCE DAILY     Cardiovascular:  Angiotensin Receptor Blockers Failed - 09/02/2020 11:27 AM      Failed - Cr in normal range and within 180 days    Creat  Date Value Ref Range Status  06/18/2018 0.91 0.50 - 0.99 mg/dL Final    Comment:    For patients >71 years of age, the reference limit for Creatinine is approximately 13% higher for people identified as African-American. .    Creatinine, Ser  Date Value Ref Range Status  07/22/2019 0.88 0.57 - 1.00 mg/dL Final         Failed - K in normal range and within 180 days    Potassium  Date Value Ref Range Status  07/22/2019 3.8 3.5 - 5.2 mmol/L Final         Failed - Valid encounter within last 6 months    Recent Outpatient Visits          8 months ago Urinary tract infection due to extended-spectrum beta lactamase (ESBL) producing Escherichia coli   [place], DO   9 months ago Acute cystitis without hematuria   [place], DO   1 year ago Urinary tract infection due to extended-spectrum beta lactamase (ESBL) producing Escherichia coli   [facility] Olin Hauser, DO   1 year ago Acute cystitis with hematuria   [place], Devonne Doughty, DO   2 years ago Annual physical exam   [facility] Olin Hauser, [place]             Passed - Patient is not pregnant      Passed - Last BP in normal range    BP Readings from Last 1 Encounters:  02/22/20 118/60          hydrOXYzine (ATARAX/VISTARIL) 10 MG tablet [Pharmacy Med Name: HYDROXYZINE HCL 10 MG TAB] 30 tablet 0    Sig: TAKE 1 TABLET BY MOUTH AT BEDTIME AS NEEDED ANXIETY/SLEEP     Ear, Nose, and Throat:  Antihistamines Passed  - 09/02/2020 11:27 AM      Passed - Valid encounter within last 12 months    Recent Outpatient Visits          8 months ago Urinary tract infection due to extended-spectrum beta lactamase (ESBL) producing Escherichia coli   [place], DO   9 months ago Acute cystitis without hematuria   [place], DO   1 year ago Urinary tract infection due to extended-spectrum beta lactamase (ESBL) producing Escherichia coli   [facility] Olin Hauser, DO   1 year ago Acute cystitis with hematuria   [facility] Olin Hauser, DO   2 years ago Annual physical exam   [place], DO             Signed Prescriptions Disp Refills   albuterol (VENTOLIN HFA) 108 (90 Base) MCG/ACT inhaler 18 g 0    Sig: INHALE 2 PUFFS EVERY 4 HOURS FOR WHEEZING OR SHORTNESS OF BREATH AS NEEDED     Pulmonology:  Beta Agonists Failed - 09/02/2020 11:27 AM  Failed - One inhaler should last at least one month. If the patient is requesting refills earlier, contact the patient to check for uncontrolled symptoms.      Passed - Valid encounter within last 12 months    Recent Outpatient Visits          8 months ago Urinary tract infection due to extended-spectrum beta lactamase (ESBL) producing Escherichia coli   [place], DO   9 months ago Acute cystitis without hematuria   [place], DO   1 year ago Urinary tract infection due to extended-spectrum beta lactamase (ESBL) producing Escherichia coli   [place], DO   1 year ago Acute cystitis with hematuria   [place], DO   2 years ago Annual physical exam   [place], DO

## 2020-09-02 NOTE — Telephone Encounter (Signed)
Requested Prescriptions  Pending Prescriptions Disp Refills  . albuterol (VENTOLIN HFA) 108 (90 Base) MCG/ACT inhaler [Pharmacy Med Name: ALBUTEROL SULFATE HFA 108 (90 BASE)] 18 g 0    Sig: INHALE 2 PUFFS EVERY 4 HOURS FOR WHEEZING OR SHORTNESS OF BREATH AS NEEDED     Pulmonology:  Beta Agonists Failed - 09/02/2020 11:27 AM      Failed - One inhaler should last at least one month. If the patient is requesting refills earlier, contact the patient to check for uncontrolled symptoms.      Passed - Valid encounter within last 12 months    Recent Outpatient Visits          8 months ago Urinary tract infection due to extended-spectrum beta lactamase (ESBL) producing Escherichia coli   [place], DO   9 months ago Acute cystitis without hematuria   [place], DO   1 year ago Urinary tract infection due to extended-spectrum beta lactamase (ESBL) producing Escherichia coli   [facility] Olin Hauser, DO   1 year ago Acute cystitis with hematuria   [facility] Olin Hauser, DO   2 years ago Annual physical exam   [facility], Devonne Doughty, DO             . losartan (COZAAR) 100 MG tablet [Pharmacy Med Name: LOSARTAN POTASSIUM 100 MG TAB] 30 tablet 0    Sig: TAKE 1 TABLET BY MOUTH ONCE DAILY     Cardiovascular:  Angiotensin Receptor Blockers Failed - 09/02/2020 11:27 AM      Failed - Cr in normal range and within 180 days    Creat  Date Value Ref Range Status  06/18/2018 0.91 0.50 - 0.99 mg/dL Final    Comment:    For patients >49 years of age, the reference limit for Creatinine is approximately 13% higher for people identified as African-American. .    Creatinine, Ser  Date Value Ref Range Status  07/22/2019 0.88 0.57 - 1.00 mg/dL Final         Failed - K in normal range and within 180 days    Potassium  Date  Value Ref Range Status  07/22/2019 3.8 3.5 - 5.2 mmol/L Final         Failed - Valid encounter within last 6 months    Recent Outpatient Visits          8 months ago Urinary tract infection due to extended-spectrum beta lactamase (ESBL) producing Escherichia coli   [place], DO   9 months ago Acute cystitis without hematuria   [place], DO   1 year ago Urinary tract infection due to extended-spectrum beta lactamase (ESBL) producing Escherichia coli   [facility] Olin Hauser, DO   1 year ago Acute cystitis with hematuria   [place], DO   2 years ago Annual physical exam   [facility] Olin Hauser, [place]             Passed - Patient is not pregnant      Passed - Last BP in normal range    BP Readings from Last 1 Encounters:  02/22/20 118/60         . hydrOXYzine (ATARAX/VISTARIL) 10 MG tablet [Pharmacy Med Name: HYDROXYZINE HCL 10 MG TAB]  30 tablet 0    Sig: TAKE 1 TABLET BY MOUTH AT BEDTIME AS NEEDED ANXIETY/SLEEP     Ear, Nose, and Throat:  Antihistamines Passed - 09/02/2020 11:27 AM      Passed - Valid encounter within last 12 months    Recent Outpatient Visits          8 months ago Urinary tract infection due to extended-spectrum beta lactamase (ESBL) producing Escherichia coli   [place], DO   9 months ago Acute cystitis without hematuria   [place], DO   1 year ago Urinary tract infection due to extended-spectrum beta lactamase (ESBL) producing Escherichia coli   [place], DO   1 year ago Acute cystitis with hematuria   [place], DO   2 years ago Annual physical exam   [place],  Devonne Doughty, DO

## 2020-09-02 NOTE — Telephone Encounter (Signed)
Refill request for losartan; no valid encounter within last 6 months; no upcoming appts noted; pt notified; decision tree completed; pt offered and accepted appt with Dr Parks Ranger, [place], 09/08/20 at 1100; she verbalized understanding; 30 day courtesy refill given; visit needed for additional refills; will route to office for notification. Requested Prescriptions  Pending Prescriptions Disp Refills   losartan (COZAAR) 100 MG tablet [Pharmacy Med Name: LOSARTAN POTASSIUM 100 MG TAB] 30 tablet 0    Sig: TAKE 1 TABLET BY MOUTH ONCE DAILY     Cardiovascular:  Angiotensin Receptor Blockers Failed - 09/02/2020 11:27 AM      Failed - Cr in normal range and within 180 days    Creat  Date Value Ref Range Status  06/18/2018 0.91 0.50 - 0.99 mg/dL Final    Comment:    For patients >4 years of age, the reference limit for Creatinine is approximately 13% higher for people identified as African-American. .    Creatinine, Ser  Date Value Ref Range Status  07/22/2019 0.88 0.57 - 1.00 mg/dL Final         Failed - K in normal range and within 180 days    Potassium  Date Value Ref Range Status  07/22/2019 3.8 3.5 - 5.2 mmol/L Final         Failed - Valid encounter within last 6 months    Recent Outpatient Visits          8 months ago Urinary tract infection due to extended-spectrum beta lactamase (ESBL) producing Escherichia coli   [place], DO   9 months ago Acute cystitis without hematuria   [place], DO   1 year ago Urinary tract infection due to extended-spectrum beta lactamase (ESBL) producing Escherichia coli   [facility] Olin Hauser, DO   1 year ago Acute cystitis with hematuria   [place], Devonne Doughty, DO   2 years ago Annual physical exam   [place], DO      Future  Appointments            In 6 days Parks Ranger, Devonne Doughty, [facility], [place] - Patient is not pregnant      Passed - Last BP in normal range    BP Readings from Last 1 Encounters:  02/22/20 118/60         Signed Prescriptions Disp Refills   albuterol (VENTOLIN HFA) 108 (90 Base) MCG/ACT inhaler 18 g 0    Sig: INHALE 2 PUFFS EVERY 4 HOURS FOR WHEEZING OR SHORTNESS OF BREATH AS NEEDED     Pulmonology:  Beta Agonists Failed - 09/02/2020 11:27 AM      Failed - One inhaler should last at least one month. If the patient is requesting refills earlier, contact the patient to check for uncontrolled symptoms.      Passed - Valid encounter within last 12 months    Recent Outpatient Visits          8 months ago Urinary tract infection due to extended-spectrum beta lactamase (ESBL) producing Escherichia coli   [place], DO   9 months ago Acute cystitis without hematuria   [place], DO   1 year ago Urinary tract infection due to extended-spectrum beta lactamase (  ESBL) producing Escherichia coli   [place], DO   1 year ago Acute cystitis with hematuria   [place], Devonne Doughty, DO   2 years ago Annual physical exam   [place], DO      Future Appointments            In 6 days Parks Ranger, Devonne Doughty, DO [facility]            hydrOXYzine (ATARAX/VISTARIL) 10 MG tablet 30 tablet 0    Sig: TAKE 1 TABLET BY MOUTH AT BEDTIME AS NEEDED ANXIETY/SLEEP     Ear, Nose, and Throat:  Antihistamines Passed - 09/02/2020 11:27 AM      Passed - Valid encounter within last 12 months    Recent Outpatient Visits          8 months ago Urinary tract infection due to extended-spectrum beta lactamase (ESBL) producing Escherichia coli   [place], DO   9 months ago Acute cystitis without hematuria   [place], DO   1 year ago Urinary tract infection due to extended-spectrum beta lactamase (ESBL) producing Escherichia coli   [place], DO   1 year ago Acute cystitis with hematuria   [place], DO   2 years ago Annual physical exam   [place], Devonne Doughty, DO      Future Appointments            In 6 days Parks Ranger, Devonne Doughty, [facility], [facility]

## 2020-09-08 ENCOUNTER — Encounter: Payer: Self-pay | Admitting: Family Medicine

## 2020-09-08 ENCOUNTER — Other Ambulatory Visit: Payer: Self-pay

## 2020-09-08 ENCOUNTER — Ambulatory Visit (INDEPENDENT_AMBULATORY_CARE_PROVIDER_SITE_OTHER): Payer: Medicare HMO | Admitting: Family Medicine

## 2020-09-08 VITALS — BP 131/54 | HR 65 | Temp 97.8°F | Resp 16 | Ht 67.0 in | Wt 153.6 lb

## 2020-09-08 DIAGNOSIS — M25511 Pain in right shoulder: Secondary | ICD-10-CM | POA: Diagnosis not present

## 2020-09-08 DIAGNOSIS — I10 Essential (primary) hypertension: Secondary | ICD-10-CM | POA: Diagnosis not present

## 2020-09-08 DIAGNOSIS — D1723 Benign lipomatous neoplasm of skin and subcutaneous tissue of right leg: Secondary | ICD-10-CM

## 2020-09-08 DIAGNOSIS — G8929 Other chronic pain: Secondary | ICD-10-CM | POA: Diagnosis not present

## 2020-09-08 DIAGNOSIS — M19011 Primary osteoarthritis, right shoulder: Secondary | ICD-10-CM

## 2020-09-08 DIAGNOSIS — F419 Anxiety disorder, unspecified: Secondary | ICD-10-CM

## 2020-09-08 DIAGNOSIS — E782 Mixed hyperlipidemia: Secondary | ICD-10-CM | POA: Diagnosis not present

## 2020-09-08 DIAGNOSIS — G720 Drug-induced myopathy: Secondary | ICD-10-CM | POA: Diagnosis not present

## 2020-09-08 DIAGNOSIS — R7309 Other abnormal glucose: Secondary | ICD-10-CM

## 2020-09-08 MED ORDER — LOSARTAN POTASSIUM 100 MG PO TABS: 100.0000 mg | ORAL_TABLET | Freq: Every day | ORAL | 3 refills | Status: DC

## 2020-09-08 MED ORDER — AMLODIPINE BESYLATE 10 MG PO TABS: 10.0000 mg | ORAL_TABLET | Freq: Every day | ORAL | 3 refills | Status: DC

## 2020-09-08 MED ORDER — SERTRALINE HCL 100 MG PO TABS: 100.0000 mg | ORAL_TABLET | Freq: Every day | ORAL | 3 refills | Status: DC

## 2020-09-08 MED ORDER — LIDOCAINE HCL (PF) 1 % IJ SOLN
4.0000 mL | Freq: Once | INTRAMUSCULAR | Status: AC
  Administered 2020-09-08: 4 mL

## 2020-09-08 MED ORDER — METHYLPREDNISOLONE ACETATE 40 MG/ML IJ SUSP
40.0000 mg | Freq: Once | INTRAMUSCULAR | Status: AC
  Administered 2020-09-08: 40 mg via INTRA_ARTICULAR

## 2020-09-08 NOTE — Progress Notes (Addendum)
Subjective:    Patient ID: Kristin Parker, female    DOB: 11-02-1950, 69 y.o.   MRN: [id number]  Kristin Parker is a 69 y.o. female presenting on 09/08/2020 for Hypertension   HPI   Elevated A1c No prior history of elevated A1c, prior lab shows A1c 5.6 Due for lab will return Meds: Never on med Currently on ARB Lifestyle: - Diet (admits eats cookies and some sweets, not following low carb diet) - Exercise (limited) Denies hypoglycemia  CHRONIC HTN: Reports no concerns Current Meds - Losartan 100mg  daily, Amlodipine 10mg  daily Reports good compliance, took meds today. Tolerating well, w/o complaints. Denies CP, dyspnea, HA, edema, dizziness / lightheadedness  Hyperlipidemia Due for lab History of myalgia on statin, with drug induced myopathy could not tolerate   COPD / Tobacco Abuse Reports taking Spiriva 1 puff daily with good results. Still trying to reduce smoking, now mostly in AM and after meals   Anxiety / Insomnia Continues on Sertraline 100mg  daily with good results. - needs refill  FOLLOW-UP Chronic R shoulder pain Prior history subacromial injection with improvement. Due for repeat now, she declines orthopedic Limited with range of motion above head Ready for repeat injection Wants to delay surgery   Health Maintenance: See HM tab, declined vaccines including FLU and PNA Declines mammogram has been ordered   Depression screen [facility] 2/9 09/08/2020 12/04/2019 07/07/2019  Decreased Interest 0 0 0  Down, Depressed, Hopeless 1 0 1  PHQ - 2 Score 1 0 1  Altered sleeping 2 - -  Tired, decreased energy 1 - -  Change in appetite 0 - -  Feeling bad or failure about yourself  0 - -  Trouble concentrating 0 - -  Moving slowly or fidgety/restless 0 - -  Suicidal thoughts 0 - -  PHQ-9 Score 4 - -  Difficult doing work/chores Somewhat difficult - -   GAD 7 : Generalized Anxiety Score 09/08/2020 11/26/2018 09/18/2016  Nervous, Anxious, on Edge 1 0 1    Control/stop worrying 1 0 3  Worry too much - different things 1 0 2  Trouble relaxing 0 0 2  Restless 0 0 1  Easily annoyed or irritable 1 0 2  Afraid - awful might happen 1 0 1  Total GAD 7 Score 5 0 12  Anxiety Difficulty Somewhat difficult Not difficult at all Not difficult at all      Social History   Tobacco Use  . Smoking status: Current Every Day Smoker    Packs/day: 0.50    Years: 30.00    Pack years: 15.00    Types: Cigarettes  . Smokeless tobacco: Current User  . Tobacco comment: not quite ready due to covid-19   Vaping Use  . Vaping Use: Former  Substance Use Topics  . Alcohol use: No    Alcohol/week: 0.0 standard drinks  . Drug use: No    Review of Systems Per HPI unless specifically indicated above     Objective:    BP (!) 131/54   Pulse 65   Temp 97.8 F (36.6 C) (Temporal)   Resp 16   Ht 5\' 7"  (1.702 m)   Wt 153 lb 9.6 oz (69.7 kg)   SpO2 95%   BMI 24.06 kg/m   Wt Readings from Last 3 Encounters:  09/08/20 153 lb 9.6 oz (69.7 kg)  02/22/20 152 lb (68.9 kg)  12/21/19 154 lb (69.9 kg)    Physical Exam Vitals and nursing note reviewed.  Constitutional:      General: She is not in acute distress.    Appearance: She is well-developed. She is not diaphoretic.     Comments: Well-appearing, comfortable, cooperative  HENT:     Head: Normocephalic and atraumatic.  Eyes:     General:        Right eye: No discharge.        Left eye: No discharge.     Conjunctiva/sclera: Conjunctivae normal.  Neck:     Thyroid: No thyromegaly.  Cardiovascular:     Rate and Rhythm: Normal rate and regular rhythm.     Heart sounds: Normal heart sounds. No murmur heard.   Pulmonary:     Effort: Pulmonary effort is normal. No respiratory distress.     Breath sounds: Normal breath sounds. No wheezing or rales.  Musculoskeletal:     Cervical back: Normal range of motion and neck supple.     Comments: R shoulder reduced range of motion flex above  head/neck Pain with impingement   Lymphadenopathy:     Cervical: No cervical adenopathy.  Skin:    General: Skin is warm and dry.     Findings: No erythema or rash.  Neurological:     Mental Status: She is alert and oriented to person, place, and time.  Psychiatric:        Behavior: Behavior normal.     Comments: Well groomed, good eye contact, normal speech and thoughts      ________________________________________________________ PROCEDURE NOTE Date: 09/08/20 Right subacromial shoulder steroid injection Discussed benefits and risks (including pain, bleeding, infection, steroid flare). Verbal consent given by patient. Medication:  1 cc Depo-medrol 40mg  and 4 cc Lidocaine 1% without epi Time Out taken  Landmarks identified. Area cleansed with alcohol wipes. Using 21 gauge and 1, 1/2 inch needle, Right subacromial bursa space was injected (with above listed medication) via posterior approach cold spray used for superficial anesthetic. Sterile bandage placed. Patient tolerated procedure well without bleeding or paresthesias. No complications.    Results for orders placed or performed in visit on 12/21/19  Urine Culture   Specimen: Urine  Result Value Ref Range   MICRO NUMBER: [id number]    SPECIMEN QUALITY: Adequate    Sample Source URINE    STATUS: FINAL    Result: No Growth   POCT Urinalysis Dipstick  Result Value Ref Range   Color, UA dark amber    Clarity, UA cloudy    Glucose, UA Negative Negative   Bilirubin, UA negative    Ketones, UA negative    Spec Grav, UA 1.010 1.010 - 1.025   Blood, UA large    pH, UA 5.0 5.0 - 8.0   Protein, UA Negative Negative   Urobilinogen, UA 0.2 0.2 or 1.0 E.U./dL   Nitrite, UA negative    Leukocytes, UA Negative Negative   Appearance     Odor        Assessment & Plan:   Problem List Items Addressed This Visit    Osteoarthritis of glenohumeral joint, right   Hyperlipidemia - Primary   Relevant Medications   losartan  (COZAAR) 100 MG tablet   amLODipine (NORVASC) 10 MG tablet   Other Relevant Orders   COMPLETE METABOLIC PANEL WITH GFR   Lipid panel   Essential hypertension   Relevant Medications   losartan (COZAAR) 100 MG tablet   amLODipine (NORVASC) 10 MG tablet   Other Relevant Orders   CBC with Differential/Platelet   COMPLETE METABOLIC PANEL WITH GFR  Elevated hemoglobin A1c   Relevant Orders   Hemoglobin A1c   Chronic right shoulder pain   Relevant Medications   sertraline (ZOLOFT) 100 MG tablet   Chronic anxiety   Relevant Medications   sertraline (ZOLOFT) 100 MG tablet    Other Visit Diagnoses    Lipoma of right lower extremity       Relevant Orders   Ambulatory referral to General Surgery     #HTN Controlled Refill current medications  #Anxiety Managed on SSRI Refill Zoloft 100mg  daily  Consistent with recurrent chronic R-shoulderpain, gradual worsening Secondary underlying moderate glenohumeral and AC arthritis and likely some bursitis flare Concern for possible adhesive capsulitis given significant limited range of motion Cannot rule out rotator cuff tendinopathy - likely given degenerative symptoms - may need advanced imaging Known arthritis other joints X-ray results above  Plan: Right shoulder subacromial steroid injection performed today, see procedure note for details. #3 steroid injection now since 2019  1. Continue rxMeloxicam NSAID 15mg oncedaily (with food) for1-2 weeks, then as needed 2.Recommended totake Tylenol Ex Str 1-2 q 6 hr PRN 3. Relative rest but keep shoulder mobile, demonstrated ROM exercises, avoid heavy lifting 4. May try heating pad PRN 5. Follow-up as needed  Already referred to Orthopedic in past  - may benefit from MRI in future also consider glenohumeral injection as option - may need surgical intervention  #Lipoma Recurrence after prior excision years ago. Referral to Gen Surgery [facility] - Dr Celine Ahr for eval  and in office lipoma excision RLE above knee. Now causing patient pain due to size / swelling  #Drug induced myopathy Cannot tolerate statin therapy due to drug induced myopathy  Orders Placed This Encounter  Procedures  . Hemoglobin A1c    Standing Status:   Future    Standing Expiration Date:   09/28/2020  . CBC with Differential/Platelet    Standing Status:   Future    Standing Expiration Date:   09/28/2020  . COMPLETE METABOLIC PANEL WITH GFR    Standing Status:   Future    Standing Expiration Date:   09/28/2020  . Lipid panel    Standing Status:   Future    Standing Expiration Date:   09/28/2020    Order Specific Question:   Has the patient fasted?    Answer:   Yes  . Ambulatory referral to General Surgery    Referral Priority:   Routine    Referral Type:   Surgical    Referral Reason:   Specialty Services Required    Requested Specialty:   General Surgery    Number of Visits Requested:   1     Meds ordered this encounter  Medications  . lidocaine (PF) (XYLOCAINE) 1 % injection 4 mL  . methylPREDNISolone acetate (DEPO-MEDROL) injection 40 mg  . losartan (COZAAR) 100 MG tablet    Sig: Take 1 tablet (100 mg total) by mouth daily.    Dispense:  90 tablet    Refill:  3    30 day courtesy refill; visit needed for additional refills  . sertraline (ZOLOFT) 100 MG tablet    Sig: Take 1 tablet (100 mg total) by mouth daily.    Dispense:  90 tablet    Refill:  3  . amLODipine (NORVASC) 10 MG tablet    Sig: Take 1 tablet (10 mg total) by mouth daily.    Dispense:  90 tablet    Refill:  3     Follow up plan: Return  in about 6 months (around 03/08/2021) for 1 week fasting lab only then 6 month follow=-up shoulder, HTN.  Labs ordered for 09/13/20  Nobie Putnam, [facility] 09/08/2020, 11:37 AM

## 2020-09-08 NOTE — Patient Instructions (Addendum)
Thank you for coming to the office today.   Referral to Gen Surgery - Dr Celine Ahr - they will call you with apt for lipoma removal  [facility] at [facility] [address] [place],  [place]  [address] Main: [phone]  ---------  You received a Right Shoulder Joint steroid injection today. - Lidocaine numbing medicine may ease the pain initially for a few hours until it wears off - As discussed, you may experience a "steroid flare" this evening or within 24-48 hours, anytime medicine is injected into an inflamed joint it can cause the pain to get worse temporarily - Everyone responds differently to these injections, it depends on the patient and the severity of the joint problem, it may provide anywhere from days to weeks, to months of relief. Ideal response is >6 months relief - Try to take it easy for next 1-2 days, avoid over activity and strain on joint (limit lifting for shoulder) - Recommend the following:   - For swelling - rest, compression sleeve / ACE wrap, elevation, and ice packs as needed for first few days   - For pain in future may use heating pad or moist heat as needed  DUE for FASTING BLOOD WORK (no food or drink after midnight before the lab appointment, only water or coffee without cream/sugar on the morning of)  SCHEDULE "Lab Only" visit in the morning at the clinic for lab draw in 1 week  - Make sure Lab Only appointment is at about 1 week before your next appointment, so that results will be available  For Lab Results, once available within 2-3 days of blood draw, you can can log in to MyChart online to view your results and a brief explanation. Also, we can discuss results at next follow-up visit.    Please schedule a Follow-up Appointment to: Return in about 6 months (around 03/08/2021) for 1 week fasting lab only then 6 month follow=-up shoulder, HTN.  If you have any other questions or concerns, please feel free to call the  office or send a message through [place]. You may also schedule an earlier appointment if necessary.  Additionally, you may be receiving a survey about your experience at our office within a few days to 1 week by e-mail or mail. We value your feedback.  Nobie Putnam, DO [place]

## 2020-09-12 ENCOUNTER — Telehealth: Payer: Self-pay

## 2020-09-12 ENCOUNTER — Other Ambulatory Visit: Payer: Self-pay | Admitting: *Deleted

## 2020-09-12 DIAGNOSIS — R7309 Other abnormal glucose: Secondary | ICD-10-CM

## 2020-09-12 DIAGNOSIS — G720 Drug-induced myopathy: Secondary | ICD-10-CM | POA: Insufficient documentation

## 2020-09-12 DIAGNOSIS — I10 Essential (primary) hypertension: Secondary | ICD-10-CM

## 2020-09-12 DIAGNOSIS — E782 Mixed hyperlipidemia: Secondary | ICD-10-CM

## 2020-09-12 NOTE — Telephone Encounter (Signed)
Patient cannot take statin due to excluding diagnosis code:  Drug Induced Myopathy (G72.0) she has muscle aches on statin therapy  She has declined to take it  Kristin Parker, [facility] 09/12/2020, 6:22 PM

## 2020-09-12 NOTE — Telephone Encounter (Signed)
Copied from [place] [id number]. Topic: General - Other >> Sep 12, 2020 12:23 PM Yvette Rack wrote: Reason for CRM: [place] with [facility] called to speak with clinical staff to recommend a Rx for a statin. Cb# [phone]  Please Advise.  KP

## 2020-09-12 NOTE — Assessment & Plan Note (Signed)
Cannot tolerate statin therapy due to drug induced myopathy 

## 2020-09-13 ENCOUNTER — Ambulatory Visit (INDEPENDENT_AMBULATORY_CARE_PROVIDER_SITE_OTHER): Payer: Medicare HMO | Admitting: General Surgery

## 2020-09-13 ENCOUNTER — Other Ambulatory Visit: Payer: Self-pay

## 2020-09-13 ENCOUNTER — Encounter: Payer: Self-pay | Admitting: General Surgery

## 2020-09-13 ENCOUNTER — Other Ambulatory Visit: Payer: Medicare HMO

## 2020-09-13 VITALS — BP 112/66 | HR 71 | Temp 98.2°F | Ht 67.0 in | Wt 151.2 lb

## 2020-09-13 DIAGNOSIS — L720 Epidermal cyst: Secondary | ICD-10-CM | POA: Diagnosis not present

## 2020-09-13 DIAGNOSIS — I10 Essential (primary) hypertension: Secondary | ICD-10-CM | POA: Diagnosis not present

## 2020-09-13 DIAGNOSIS — R7309 Other abnormal glucose: Secondary | ICD-10-CM | POA: Diagnosis not present

## 2020-09-13 DIAGNOSIS — E782 Mixed hyperlipidemia: Secondary | ICD-10-CM | POA: Diagnosis not present

## 2020-09-13 NOTE — Progress Notes (Signed)
Patient ID: Kristin Parker, female   DOB: January 10, 1951, 69 y.o.   MRN: [id number]  Chief Complaint  Patient presents with  . New Patient (Initial Visit)     last seen 2016 ref Dr.Karamalegos lipoma right lower leg    HPI Kristin Parker is a 69 y.o. female.   She is here for evaluation of a soft tissue mass on her right leg.  Several years ago, Dr. Jamal Collin removed a lesion from her leg.  Pathology was consistent with an epidermal inclusion cyst.  She states that it has returned and is getting larger.  It is uncomfortable and she feels like it is under pressure.  It is painful if she bumps it against any firm surface.  She denies any fevers or chills.  No nausea or vomiting.  There has been no spontaneous drainage of the site.  No warmth or redness.  She would like to have it removed again.   Past Medical History:  Diagnosis Date  . Anxiety   . Aortic valve sclerosis   . Bilateral carotid bruits   . Carotid artery stenosis   . Hypertension   . Mitral regurgitation   . Mixed hyperlipidemia   . Shoulder pain   . Sleeping difficulties     Past Surgical History:  Procedure Laterality Date  . BREAST BIOPSY Left 08/13/2018   affirm bx, x clip, path pending  . CATARACT EXTRACTION Left    2017  . COLONOSCOPY  2012   Dr Candace Cruise  . LEFT HEART CATH AND CORONARY ANGIOGRAPHY N/A 06/25/2017   Procedure: LEFT HEART CATH AND CORONARY ANGIOGRAPHY;  Surgeon: [name], MD;  Location: [facility];  Service: Cardiovascular;  Laterality: N/A;    Family History  Problem Relation Age of Onset  . Cancer Mother        bladder cancer  . Heart disease Mother 42       Pacemaker  . Cancer Father        lung  . Multiple sclerosis Sister   . Valvular heart disease Brother 48       s/p bioprosthetic valve replacement at [facility]  . Breast cancer Neg Hx     Social History Social History   Tobacco Use  . Smoking status: Current Every Day Smoker    Packs/day: 0.50    Years: 30.00    Pack  years: 15.00    Types: Cigarettes  . Smokeless tobacco: Current User  . Tobacco comment: not quite ready due to covid-19   Vaping Use  . Vaping Use: Former  Substance Use Topics  . Alcohol use: No    Alcohol/week: 0.0 standard drinks  . Drug use: No    Allergies  Allergen Reactions  . Bactrim [Sulfamethoxazole-Trimethoprim] Nausea And Vomiting  . Aleve [Naproxen Sodium] Swelling    Lips. But patient can take Ibuprofen without problems    Current Outpatient Medications  Medication Sig Dispense Refill  . albuterol (VENTOLIN HFA) 108 (90 Base) MCG/ACT inhaler INHALE 2 PUFFS EVERY 4 HOURS FOR WHEEZING OR SHORTNESS OF BREATH AS NEEDED 18 g 0  . amLODipine (NORVASC) 10 MG tablet Take 1 tablet (10 mg total) by mouth daily. 90 tablet 3  . aspirin EC 81 MG tablet Take 1 tablet (81 mg total) by mouth daily.    . cetirizine (ZYRTEC) 10 MG tablet Take 10 mg by mouth daily.    Marland Kitchen ezetimibe (ZETIA) 10 MG tablet TAKE 1 TABLET BY MOUTH ONCE DAILY 90 tablet 0  .  fluticasone (FLONASE) 50 MCG/ACT nasal spray USE 2 SPRAYS INTO EACH NOSTRIL ONCE DAILY 16 g 5  . hydrOXYzine (ATARAX/VISTARIL) 10 MG tablet TAKE 1 TABLET BY MOUTH AT BEDTIME AS NEEDED ANXIETY/SLEEP 30 tablet 0  . isosorbide mononitrate (IMDUR) 60 MG 24 hr tablet TAKE 1 TABLET BY MOUTH ONCE DAILY 90 tablet 0  . losartan (COZAAR) 100 MG tablet Take 1 tablet (100 mg total) by mouth daily. 90 tablet 3  . meloxicam (MOBIC) 15 MG tablet TAKE 1 TABLET BY MOUTH ONCE DAILY AS NEEDED FOR PAIN WITH FOOD 30 tablet 5  . Omega-3 Fatty Acids (FISH OIL) 1000 MG CAPS Take 1 capsule (1,000 mg total) by mouth 2 (two) times daily. (Patient taking differently: Take 1 capsule by mouth daily. ) 60 capsule 0  . sertraline (ZOLOFT) 100 MG tablet Take 1 tablet (100 mg total) by mouth daily. 90 tablet 3  . SPIRIVA HANDIHALER 18 MCG inhalation capsule INHALE CONTENTS OF 1 CAPSULE ONCE DAILY 30 capsule 11   No current facility-administered medications for this visit.      Review of Systems Review of Systems  Eyes: Positive for visual disturbance.       Cataract right eye  Skin:       Occasional pruritus  Psychiatric/Behavioral: Positive for dysphoric mood.  All other systems reviewed and are negative. Or as discussed in the history of present illness.  Blood pressure 112/66, pulse 71, temperature 98.2 F (36.8 C), temperature source Oral, height 5\' 7"  (1.702 m), weight 151 lb 3.2 oz (68.6 kg), SpO2 91 %. Body mass index is 23.68 kg/m.  Physical Exam Physical Exam Constitutional:      General: She is not in acute distress.    Appearance: Normal appearance. She is normal weight.  HENT:     Head: Normocephalic and atraumatic.     Nose:     Comments: Covered with a mask    Mouth/Throat:     Comments: Covered with a mask Eyes:     General: No scleral icterus.       Right eye: No discharge.        Left eye: No discharge.  Neck:     Comments: The thyroid is full bilaterally without discrete masses.  The gland moves freely with deglutition. Cardiovascular:     Rate and Rhythm: Normal rate and regular rhythm.     Heart sounds: Murmur heard.   Pulmonary:     Effort: Pulmonary effort is normal. No respiratory distress.     Breath sounds: Normal breath sounds.  Abdominal:     General: Bowel sounds are normal.     Palpations: Abdomen is soft.  Lymphadenopathy:     Cervical: No cervical adenopathy.  Skin:         Comments: Marked photoaging.  There is a soft, well-circumscribed mass on the patient's right lower thigh, just cranial to the knee.  There is a scar consistent with prior surgical intervention.  The lesion is approximately 3 cm in diameter.  No erythema or induration.  Neurological:     General: No focal deficit present.     Mental Status: She is alert and oriented to person, place, and time.  Psychiatric:        Mood and Affect: Mood normal.        Behavior: Behavior normal.     Data Reviewed I looked back at the clinic  notes from when she was seen by Dr. Jamal Collin.  He reports removing 2 skin lesions,  one a dermatofibroma from the left leg as well as the epidermal inclusion cyst from her right leg.  The patient states she has no recollection of anything being removed from her left leg.  Assessment This is a 69 year old woman with a recurrent likely epidermal inclusion cyst.  She would like to have it removed.  Plan We have arranged a procedure only visit for this coming Thursday.  I will plan to excise the lesion at that time.  I discussed the risks of the procedure with her.  These include, but are not limited to, bleeding, infection, recurrence, scarring, wound healing problems.  She would like to proceed.    Fredirick Maudlin 09/13/2020, 1:47 PM

## 2020-09-13 NOTE — Patient Instructions (Addendum)
Patient will come back in Thursday for an in-office procedure - excision of epidermal cyst of right knee.  Epidermal Cyst  An epidermal cyst is a small, painless lump under your skin. The cyst contains a grayish-white, bad-smelling substance (keratin). Do not try to pop or open an epidermal cyst yourself. What are the causes?  A blocked hair follicle.  A hair that curls and re-enters the skin instead of growing straight out of the skin.  A blocked pore.  Irritated skin.  An injury to the skin.  Certain conditions that are passed along from parent to child (inherited).  Human papillomavirus (HPV).  Long-term sun damage to the skin. What increases the risk?  Having acne.  Being overweight.  Being 11-57 years old. What are the signs or symptoms? These cysts are usually harmless, but they can get infected. Symptoms of infection may include:  Redness.  Inflammation.  Tenderness.  Warmth.  Fever.  A grayish-white, bad-smelling substance drains from the cyst.  Pus drains from the cyst. How is this treated? In many cases, epidermal cysts go away on their own without treatment. If a cyst becomes infected, treatment may include:  Opening and draining the cyst, done by a doctor. After draining, you may need minor surgery to remove the rest of the cyst.  Antibiotic medicine.  Shots of medicines (steroids) that help to reduce inflammation.  Surgery to remove the cyst. Surgery may be done if the cyst: ? Becomes large. ? Bothers you. ? Has a chance of turning into cancer.  Do not try to open a cyst yourself. Follow these instructions at home:  Take over-the-counter and prescription medicines only as told by your doctor.  If you were prescribed an antibiotic medicine, take it it as told by your doctor. Do not stop using the antibiotic even if you start to feel better.  Keep the area around your cyst clean and dry.  Wear loose, dry clothing.  Avoid touching your  cyst.  Check your cyst every day for signs of infection. Check for: ? Redness, swelling, or pain. ? Fluid or blood. ? Warmth. ? Pus or a bad smell.  Keep all follow-up visits as told by your doctor. This is important. How is this prevented?  Wear clean, dry, clothing.  Avoid wearing tight clothing.  Keep your skin clean and dry. Take showers or baths every day. Contact a doctor if:  Your cyst has symptoms of infection.  Your condition does not improve or gets worse.  You have a cyst that looks different from other cysts you have had.  You have a fever. Get help right away if:  Redness spreads from the cyst into the area close by. Summary  An epidermal cyst is a sac made of skin tissue.  If a cyst becomes infected, treatment may include surgery to open and drain the cyst, or to remove it.  Take over-the-counter and prescription medicines only as told by your doctor.  Contact a doctor if your condition is not improving or is getting worse.  Keep all follow-up visits as told by your doctor. This is important. This information is not intended to replace advice given to you by your health care provider. Make sure you discuss any questions you have with your health care provider. Document Revised: 02/05/2019 Document Reviewed: 07/24/2018 Elsevier Patient Education  2020 [employer].

## 2020-09-14 ENCOUNTER — Other Ambulatory Visit: Payer: Self-pay | Admitting: Internal Medicine

## 2020-09-14 LAB — COMPLETE METABOLIC PANEL WITH GFR
AG Ratio: 1.7 (calc) (ref 1.0–2.5)
ALT: 13 U/L (ref 6–29)
AST: 16 U/L (ref 10–35)
Albumin: 4.8 g/dL (ref 3.6–5.1)
Alkaline phosphatase (APISO): 125 U/L (ref 37–153)
BUN/Creatinine Ratio: 15 (calc) (ref 6–22)
BUN: 15 mg/dL (ref 7–25)
CO2: 24 mmol/L (ref 20–32)
Calcium: 10 mg/dL (ref 8.6–10.4)
Chloride: 101 mmol/L (ref 98–110)
Creat: 1 mg/dL — ABNORMAL HIGH (ref 0.60–0.93)
GFR, Est African American: 66 mL/min/{1.73_m2} (ref >=60)
GFR, Est Non African American: 57 mL/min/{1.73_m2} — ABNORMAL LOW (ref >=60)
Globulin: 2.9 g/dL (calc) (ref 1.9–3.7)
Glucose, Bld: 89 mg/dL (ref 65–99)
Potassium: 4.5 mmol/L (ref 3.5–5.3)
Sodium: 137 mmol/L (ref 135–146)
Total Bilirubin: 0.4 mg/dL (ref 0.2–1.2)
Total Protein: 7.7 g/dL (ref 6.1–8.1)

## 2020-09-14 LAB — CBC WITH DIFFERENTIAL/PLATELET
Absolute Monocytes: 848 cells/uL (ref 200–950)
Basophils Absolute: 85 cells/uL (ref 0–200)
Basophils Relative: 0.8 %
Eosinophils Absolute: 138 cells/uL (ref 15–500)
Eosinophils Relative: 1.3 %
HCT: 49 % — ABNORMAL HIGH (ref 35.0–45.0)
Hemoglobin: 16.8 g/dL — ABNORMAL HIGH (ref 11.7–15.5)
Lymphs Abs: 3721 cells/uL (ref 850–3900)
MCH: 31.4 pg (ref 27.0–33.0)
MCHC: 34.3 g/dL (ref 32.0–36.0)
MCV: 91.6 fL (ref 80.0–100.0)
MPV: 10 fL (ref 7.5–12.5)
Monocytes Relative: 8 %
Neutro Abs: 5809 cells/uL (ref 1500–7800)
Neutrophils Relative %: 54.8 %
Platelets: 348 10*3/uL (ref 140–400)
RBC: 5.35 10*6/uL — ABNORMAL HIGH (ref 3.80–5.10)
RDW: 13 % (ref 11.0–15.0)
Total Lymphocyte: 35.1 %
WBC: 10.6 10*3/uL (ref 3.8–10.8)

## 2020-09-14 LAB — HEMOGLOBIN A1C
Hgb A1c MFr Bld: 5.4 % of total Hgb (ref <5.7)
Mean Plasma Glucose: 108 (calc)
eAG (mmol/L): 6 (calc)

## 2020-09-14 LAB — LIPID PANEL
Cholesterol: 305 mg/dL — ABNORMAL HIGH (ref <200)
HDL: 45 mg/dL — ABNORMAL LOW (ref >=50)
LDL Cholesterol (Calc): 207 mg/dL (calc) — ABNORMAL HIGH
Non-HDL Cholesterol (Calc): 260 mg/dL (calc) — ABNORMAL HIGH (ref <130)
Total CHOL/HDL Ratio: 6.8 (calc) — ABNORMAL HIGH (ref <5.0)
Triglycerides: 314 mg/dL — ABNORMAL HIGH (ref <150)

## 2020-09-14 NOTE — Progress Notes (Signed)
Follow-up Outpatient Visit Date: 09/15/2020  Primary Care Provider: Olin Hauser, DO [address] [address]  Chief Complaint: Follow-up coronary artery disease  HPI:  Kristin Parker is a 69 y.o. female with history of coronary artery disease (up to 40% distal LMCA by cath in 05/2017), carotid artery stenosis, hypertension, hyperlipidemia, depression, and anxiety, who presents for follow-up of coronary artery disease.  She was last evaluated by Kristin Montana, NP, via virtual visit in April.  At that time, she complained of occasional chest tightness and shortness of breath, which she attributed to anxiety.  No medication changes or additional testing were recommended.  Today, Kristin Parker reports that she has been feeling a little bit worse over the last few months.  She is getting out of breath more easily with activity.  She also feels like her heart beats harder whenever she does activities including carrying a bucket of water.  She has not had any frank chest pain.  At times her heart seems to skip a beat, but she has not had any flutters or rapid heartbeats.  She denies edema and lightheadedness.  She continues to smoke up to a pack a day some days.  She also complains of intermittent cramps in her flank and her legs, particularly when turning a certain way or when lying in bed at night.  Home blood pressures typically 128-135/50-60.  She is compliant with her medications, remaining on ezetimibe.  She has been intolerant of multiple statins due to severe myalgias.  --------------------------------------------------------------------------------------------------  Cardiovascular History & Procedures: Cardiovascular Problems:  Coronary artery disease  Aortic sclerosis  Carotid artery stenosis (mild to moderate)  Risk Factors:  Cerebrovascular disease, hypertension, hyperlipidemia, tobacco use, and age greater than 50  Cath/PCI:  LHC (06/25/17): Mild to moderate CAD,  including 40% distal LMCA, 20% ostial LAD, 20-30% mid LAD, and sequential 30% proximal and mid LCx stenoses. Small, nondominant RCA without significant disease. Normal left ventricular filling pressure.  CV Surgery:  None  EP Procedures and Devices:  None  Non-Invasive Evaluation(s):  ETT (06/18/17): Abnormal exercise tolerance test, the target heart rate not achieved. 1-2 mm horizontal ST depressions noted in inferior leads. Patient only achieved 84% MPHR due to shortness of breath and fatigue.  Carotid artery Duplex (03/01/17): Smooth plaque, bilaterally. 40-59% RICA stenosis. 6-50% LICA stenosis. Patent vertebral arteries with antegrade flow. Normal subclavian arteries, bilaterally.  TTE (03/01/17): Normal LV size and function with LVEF 60-65% and grade 1 diastolic dysfunction. Mild MR. Normal RV size and function. Normal PA pressure.  Recent CV Pertinent Labs: Lab Results  Component Value Date   CHOL 305 (H) 09/13/2020   CHOL 135 07/22/2019   HDL 45 (L) 09/13/2020   HDL 43 07/22/2019   LDLCALC 207 (H) 09/13/2020   LDLDIRECT 62 05/28/2017   TRIG 314 (H) 09/13/2020   CHOLHDL 6.8 (H) 09/13/2020   INR 0.94 06/24/2017   BNP 53.0 01/27/2017   K 4.5 09/13/2020   BUN 15 09/13/2020   BUN 11 07/22/2019   CREATININE 1.00 (H) 09/13/2020    Past medical and surgical history were reviewed and updated in EPIC.  Current Meds  Medication Sig  . albuterol (VENTOLIN HFA) 108 (90 Base) MCG/ACT inhaler INHALE 2 PUFFS EVERY 4 HOURS FOR WHEEZING OR SHORTNESS OF BREATH AS NEEDED  . amLODipine (NORVASC) 10 MG tablet Take 1 tablet (10 mg total) by mouth daily.  Marland Kitchen aspirin EC 81 MG tablet Take 1 tablet (81 mg total) by mouth  daily.  . cetirizine (ZYRTEC) 10 MG tablet Take 10 mg by mouth daily.  Marland Kitchen ezetimibe (ZETIA) 10 MG tablet TAKE 1 TABLET BY MOUTH ONCE DAILY  . fluticasone (FLONASE) 50 MCG/ACT nasal spray USE 2 SPRAYS INTO EACH NOSTRIL ONCE DAILY  . hydrOXYzine (ATARAX/VISTARIL) 10 MG  tablet TAKE 1 TABLET BY MOUTH AT BEDTIME AS NEEDED ANXIETY/SLEEP  . isosorbide mononitrate (IMDUR) 60 MG 24 hr tablet TAKE 1 TABLET BY MOUTH ONCE DAILY  . losartan (COZAAR) 100 MG tablet Take 1 tablet (100 mg total) by mouth daily.  . meloxicam (MOBIC) 15 MG tablet TAKE 1 TABLET BY MOUTH ONCE DAILY AS NEEDED FOR PAIN WITH FOOD  . Omega-3 Fatty Acids (FISH OIL) 1000 MG CAPS Take 1 capsule (1,000 mg total) by mouth 2 (two) times daily. (Patient taking differently: Take 1 capsule by mouth daily. )  . sertraline (ZOLOFT) 100 MG tablet Take 1 tablet (100 mg total) by mouth daily.  Marland Kitchen SPIRIVA HANDIHALER 18 MCG inhalation capsule INHALE CONTENTS OF 1 CAPSULE ONCE DAILY    Allergies: Bactrim [sulfamethoxazole-trimethoprim] and Aleve [naproxen sodium]  Social History   Tobacco Use  . Smoking status: Current Every Day Smoker    Packs/day: 0.50    Years: 30.00    Pack years: 15.00    Types: Cigarettes  . Smokeless tobacco: Current User  . Tobacco comment: not quite ready due to covid-19   Vaping Use  . Vaping Use: Former  Substance Use Topics  . Alcohol use: No    Alcohol/week: 0.0 standard drinks  . Drug use: No    Family History  Problem Relation Age of Onset  . Cancer Mother        bladder cancer  . Heart disease Mother 61       Pacemaker  . Cancer Father        lung  . Multiple sclerosis Sister   . Valvular heart disease Brother 73       s/p bioprosthetic valve replacement at [facility]  . Breast cancer Neg Hx     Review of Systems: A 12-system review of systems was performed and was negative except as noted in the HPI.  --------------------------------------------------------------------------------------------------  Physical Exam: BP 132/66   Pulse (!) 59   Ht 5\' 7"  (1.702 m)   Wt 152 lb (68.9 kg)   BMI 23.81 kg/m   General: NAD. HEENT: No conjunctival pallor or scleral icterus. Facemask in place. Neck: Supple without lymphadenopathy, thyromegaly, JVD, or HJR.  Bilateral  carotid bruits noted. Lungs: Normal work of breathing.  Mildly diminished breath sounds throughout without wheezes or crackles. Heart: Regular rate and rhythm without murmurs, rubs, or gallops. Abd: Bowel sounds present. Soft, NT/ND without hepatosplenomegaly Ext: No lower extremity edema.  EKG: Sinus bradycardia (heart rate 59 bpm) with nonspecific ST changes.  No significant change from prior tracing on 07/22/2019.  Lab Results  Component Value Date   WBC 10.6 09/13/2020   HGB 16.8 (H) 09/13/2020   HCT 49.0 (H) 09/13/2020   MCV 91.6 09/13/2020   PLT 348 09/13/2020    Lab Results  Component Value Date   NA 137 09/13/2020   K 4.5 09/13/2020   CL 101 09/13/2020   CO2 24 09/13/2020   BUN 15 09/13/2020   CREATININE 1.00 (H) 09/13/2020   GLUCOSE 89 09/13/2020   ALT 13 09/13/2020    Lab Results  Component Value Date   CHOL 305 (H) 09/13/2020   HDL 45 (L) 09/13/2020   LDLCALC 207 (  H) 09/13/2020   LDLDIRECT 62 05/28/2017   TRIG 314 (H) 09/13/2020   CHOLHDL 6.8 (H) 09/13/2020    --------------------------------------------------------------------------------------------------  ASSESSMENT AND PLAN: Coronary artery disease with accelerating angina: Ms. Ackley has a history of moderate nonobstructive coronary artery disease including 40% distal LMCA stenosis by catheterization in 2018.  She denies chest pain but has experienced worsening exertional dyspnea as well as more forceful pounding of her heart with mild activity over the last 3 to 4 months.  I worry this may reflect progression of her CAD, as she has been intolerant of statin therapy and most recent lipid panel 2 days ago showed an LDL of 207.  Ms. Nary also continues to smoke.  I have recommended that we obtain a cardiac CTA for further evaluation.  We will plan to continue her current antianginal regimen including amlodipine and isosorbide mononitrate.  Dyspnea on exertion and mitral regurgitation: Progressive exertional  dyspnea could also be a manifestation of progressive valvular heart disease, though examination today is notable for absence of murmurs.  Echo in 2018 showed degenerative mitral valve disease with mild MR.  We will plan to repeat an echocardiogram.  Carotid artery stenosis: Mild bilateral carotid artery stenosis noted last year.  No neurologic symptoms reported.  Continue aspirin and lipid therapy, as outlined below.  Hypertension: Blood pressure borderline today but typically better at home.  Continue current regimen of amlodipine, isosorbide mononitrate, and losartan.  Hyperlipidemia: Lipid panel 2 days ago notable for severely elevated LDL of 207.  Given significant ASCVD involving the coronary and carotid arteries, aggressive lipid-lowering therapy is indicated.  Ms. Thobe has been intolerant of multiple statins and is currently on ezetimibe.  I will refer her to our lipid clinic to discuss initiation of PCSK9 inhibitor and/or bempedoic acid therapy for target LDL less than 70.  Muscle cramps: Electrolytes normal on labs drawn earlier this week.  I encouraged Ms. Kinoshita to stay well-hydrated, in case this is contributing to her muscle cramps.  Of note, she has not been on a statin.  Follow-up: Return to clinic after cardiac CTA and echocardiogram.  [name], MD 09/15/2020 8:18 AM

## 2020-09-14 NOTE — Telephone Encounter (Signed)
Called [employer] long wait and transferring to different people finally got disconnected.

## 2020-09-15 ENCOUNTER — Other Ambulatory Visit: Payer: Self-pay

## 2020-09-15 ENCOUNTER — Encounter: Payer: Self-pay | Admitting: Internal Medicine

## 2020-09-15 ENCOUNTER — Encounter: Payer: Self-pay | Admitting: General Surgery

## 2020-09-15 ENCOUNTER — Telehealth: Payer: Self-pay | Admitting: Family Medicine

## 2020-09-15 ENCOUNTER — Ambulatory Visit (INDEPENDENT_AMBULATORY_CARE_PROVIDER_SITE_OTHER): Payer: Medicare HMO | Admitting: General Surgery

## 2020-09-15 ENCOUNTER — Ambulatory Visit (INDEPENDENT_AMBULATORY_CARE_PROVIDER_SITE_OTHER): Payer: Medicare HMO | Admitting: Internal Medicine

## 2020-09-15 VITALS — BP 132/66 | HR 59 | Ht 67.0 in | Wt 152.0 lb

## 2020-09-15 VITALS — BP 122/66 | HR 65 | Temp 98.8°F | Ht 67.0 in | Wt 151.4 lb

## 2020-09-15 DIAGNOSIS — I25119 Atherosclerotic heart disease of native coronary artery with unspecified angina pectoris: Secondary | ICD-10-CM

## 2020-09-15 DIAGNOSIS — I10 Essential (primary) hypertension: Secondary | ICD-10-CM | POA: Diagnosis not present

## 2020-09-15 DIAGNOSIS — Z789 Other specified health status: Secondary | ICD-10-CM | POA: Diagnosis not present

## 2020-09-15 DIAGNOSIS — L720 Epidermal cyst: Secondary | ICD-10-CM

## 2020-09-15 DIAGNOSIS — R0609 Other forms of dyspnea: Secondary | ICD-10-CM

## 2020-09-15 DIAGNOSIS — I340 Nonrheumatic mitral (valve) insufficiency: Secondary | ICD-10-CM | POA: Diagnosis not present

## 2020-09-15 DIAGNOSIS — E782 Mixed hyperlipidemia: Secondary | ICD-10-CM

## 2020-09-15 DIAGNOSIS — R0600 Dyspnea, unspecified: Secondary | ICD-10-CM | POA: Diagnosis not present

## 2020-09-15 DIAGNOSIS — I6523 Occlusion and stenosis of bilateral carotid arteries: Secondary | ICD-10-CM

## 2020-09-15 MED ORDER — METOPROLOL TARTRATE 50 MG PO TABS: 50.0000 mg | ORAL_TABLET | Freq: Once | ORAL | 0 refills | Status: DC

## 2020-09-15 NOTE — Telephone Encounter (Signed)
-----   Message from Mliss Sax, RN sent at 09/15/2020  3:53 PM EST ----- Pt given lab results per notes of Dr.Karamalegos on 09/15/20. Pt verbalized understanding. Patient reports she is awaiting approval to start "a shot to take every 2 weeks" for management of cholesterol due to not tolerating statin drugs. Patient questioned if she is required to take any medications for elevated hemoglobin. Informed patient PCP would have recommended ways to lower hemoglobin if needed. Please advise .

## 2020-09-15 NOTE — Patient Instructions (Addendum)
We have removed a Cyst in our office today.  You have sutures under the skin that will dissolve and also dermabond (skin glue) on top of your skin and steri strips which will come off on their own in 10-14 days.  You may use Ibuprofen or Tylenol as needed for pain. Apply ice several times a day for the next two days.  You may shower tomorrow. Do not submerge the area until fully healed.  Avoid Strenuous activities that will make you sweat during the next 48 hours to avoid the glue coming off prematurely. Avoid activities that will place pressure to this area of the body for 1-2 weeks to avoid re-injury to incision site.   If you have any questions or concerns prior to this appointment, call our office and speak with a nurse.    Excision of Skin Cysts or Lesions Excision of a skin lesion refers to the removal of a section of skin by making small cuts (incisions) in the skin. This procedure may be done to remove a cancerous (malignant) or noncancerous (benign) growth on the skin. It is typically done to treat or prevent cancer or infection. It may also be done to improve cosmetic appearance. The procedure may be done to remove:  Cancerous growths, such as basal cell carcinoma, squamous cell carcinoma, or melanoma.  Noncancerous growths, such as a cyst or lipoma.  Growths, such as moles or skin tags, which may be removed for cosmetic reasons.  Various excision or surgical techniques may be used depending on your condition, the location of the lesion, and your overall health. Tell a health care provider about:  Any allergies you have.  All medicines you are taking, including vitamins, herbs, eye drops, creams, and over-the-counter medicines.  Any problems you or family members have had with anesthetic medicines.  Any blood disorders you have.  Any surgeries you have had.  Any medical conditions you have.  Whether you are pregnant or may be pregnant. What are the risks? Generally,  this is a safe procedure. However, problems may occur, including:  Bleeding.  Infection.  Scarring.  Recurrence of the cyst, lipoma, or cancer.  Changes in skin sensation or appearance, such as discoloration or swelling.  Reaction to the anesthetics.  Allergic reaction to surgical materials or ointments.  Damage to nerves, blood vessels, muscles, or other structures.  Continued pain.  What happens before the procedure?  Ask your health care provider about: ? Changing or stopping your regular medicines. This is especially important if you are taking diabetes medicines or blood thinners. ? Taking medicines such as aspirin and ibuprofen. These medicines can thin your blood. Do not take these medicines before your procedure if your health care provider instructs you not to.  You may be asked to take certain medicines.  You may be asked to stop smoking.  You may have an exam or testing.  Plan to have someone take you home after the procedure.  Plan to have someone help you with activities during recovery. What happens during the procedure?  To reduce your risk of infection: ? Your health care team will wash or sanitize their hands. ? Your skin will be washed with soap.  You will be given a medicine to numb the area (local anesthetic).  One of the following excision techniques will be performed.  At the end of any of these procedures, antibiotic ointment will be applied as needed. Each of the following techniques may vary among health care providers and  hospitals. Complete Surgical Excision The area of skin that needs to be removed will be marked with a pen. Using a small scalpel or scissors, the surgeon will gently cut around and under the lesion until it is completely removed. The lesion will be placed in a fluid and sent to the lab for examination. If necessary, bleeding will be controlled with a device that delivers heat (electrocautery). The edges of the wound may be  stitched (sutured) together, and a bandage (dressing) will be applied. This procedure may be performed to treat a cancerous growth or a noncancerous cyst or lesion. Excision of a Cyst The surgeon will make an incision on the cyst. The entire cyst will be removed through the incision. The incision may be closed with sutures. Shave Excision During shave excision, the surgeon will use a small blade or an electrically heated loop instrument to shave off the lesion. This may be done to remove a mole or a skin tag. The wound will usually be left to heal on its own without sutures. Punch Excision During punch excision, the surgeon will use a small tool that is like a cookie cutter or a hole punch to cut a circle shape out of the skin. The outer edges of the skin will be sutured together. This may be done to remove a mole or a scar or to perform a biopsy of the lesion. Mohs Micrographic Surgery During Mohs micrographic surgery, layers of the lesion will be removed with a scalpel or a loop instrument and will be examined right away under a microscope. Layers will be removed until all of the abnormal or cancerous tissue has been removed. This procedure is minimally invasive, and it ensures the best cosmetic outcome. It involves the removal of as little normal tissue as possible. Mohs is usually done to treat skin cancer, such as basal cell carcinoma or squamous cell carcinoma, particularly on the face and ears. Depending on the size of the surgical wound, it may be sutured closed. What happens after the procedure?  Return to your normal activities as told by your health care provider.  Talk with your health care provider to discuss any test results, treatment options, and if necessary, the need for more tests. This information is not intended to replace advice given to you by your health care provider. Make sure you discuss any questions you have with your health care provider. Document Released: 01/09/2010  Document Revised: 03/22/2016 Document Reviewed: 12/01/2014 Elsevier Interactive Patient Education  [employer].

## 2020-09-15 NOTE — Progress Notes (Signed)
Sebaceous Cyst Excision Procedure Note  Pre-operative Diagnosis: Sebaceous cyst  Post-operative Diagnosis: same  Locations:right knee  Indications: Recurrence of previously excised sebaceous cyst  Anesthesia: Lidocaine 1% with epinephrine with added sodium bicarbonate  Procedure Details  History of allergy to iodine: no  Patient informed of the risks (including bleeding and infection) and benefits of the  procedure and Written informed consent obtained.  The lesion and surrounding area was given a sterile prep using chlorhexidine and draped in the usual sterile fashion. An incision was made over the cyst, which was dissected free of the surrounding tissue and removed.  The cyst was filled with typical sebaceous material.  The wound was closed with 3-0 Vicryl using simple interrupted stitches, followed by 4-0 Monocryl subcuticular suture.  Dermabond and Steri-Strips were applied the specimen was not sent for pathologic examination. The patient tolerated the procedure well.  EBL: Less than 1 ml  Findings: 3 cm epidermal inclusion cyst  Condition: Stable  Complications: none.  Plan: 1. Instructed to keep the wound dry and covered for 24-48h and clean thereafter. 2. Warning signs of infection were reviewed.   3. Recommended that the patient use OTC analgesics as needed for pain.  4. Return to clinic as needed

## 2020-09-15 NOTE — Patient Instructions (Addendum)
Medication Instructions:  Your physician recommends that you continue on your current medications as directed. Please refer to the Current Medication list given to you today.  *If you need a refill on your cardiac medications before your next appointment, please call your pharmacy*   Lab Work: Your last lab work was on 09/13/20. You must have a BMET within 30 days of the Cardiac CTA.  As long as the CTA is with 30 days of 09/13/20, then you will not have to get lab work prior to.  If you have labs (blood work) drawn today and your tests are completely normal, you will receive your results only by: Marland Kitchen MyChart Message (if you have MyChart) OR . A paper copy in the mail If you have any lab test that is abnormal or we need to change your treatment, we will call you to review the results.   Testing/Procedures:  Echocardiogram - Your physician has requested that you have an echocardiogram. Echocardiography is a painless test that uses sound waves to create images of your heart. It provides your doctor with information about the size and shape of your heart and how well your heart's chambers and valves are working. This procedure takes approximately one hour. There are no restrictions for this procedure. There is a possibility that an IV may need to be started during your test to inject an image enhancing agent. This is done to obtain more optimal pictures of your heart. Therefore we ask that you do at least drink some water prior to coming in to hydrate your veins.    Cardiac CTA Your cardiac CT will be scheduled at one of the below locations:   [facility] [address] [place], [place] [address] (336) [place] [address] [place], [place] [address] [phone]  If scheduled at [facility], please arrive at the [facility] main entrance of [facility] 30 minutes prior to test start  time. Proceed to the [facility] Radiology Department (first floor) to check-in and test prep.  If scheduled at [facility], please arrive 15 mins early for check-in and test prep.  Please follow these instructions carefully (unless otherwise directed):   On the Night Before the Test: . Be sure to Drink plenty of water. . Do not consume any caffeinated/decaffeinated beverages or chocolate 12 hours prior to your test. . Do not take any antihistamines 12 hours prior to your test.  On the Day of the Test: . Drink plenty of water. Do not drink any water within one hour of the test. . Do not eat any food 4 hours prior to the test. . You may take your regular medications prior to the test.  . Take metoprolol (Lopressor) two hours prior to test. - Lopressor 50 mg  . FEMALES- please wear underwire-free bra if available      After the Test: . Drink plenty of water. . After receiving IV contrast, you may experience a mild flushed feeling. This is normal. . On occasion, you may experience a mild rash up to 24 hours after the test. This is not dangerous. If this occurs, you can take Benadryl 25 mg and increase your fluid intake. . If you experience trouble breathing, this can be serious. If it is severe call 911 IMMEDIATELY. If it is mild, please call our office. . If you take any of these medications: Glipizide/Metformin, Avandament, Glucavance, please do not take  48 hours after completing test unless otherwise instructed.   Once we have confirmed authorization from your insurance company, we will call you to set up a date and time for your test. Based on how quickly your insurance processes prior authorizations requests, please allow up to 4 weeks to be contacted for scheduling your Cardiac CT appointment. Be advised that routine Cardiac CT appointments could be scheduled as many as 8 weeks after your provider has ordered it.  For non-scheduling related questions, please  contact the cardiac imaging nurse navigator should you have any questions/concerns: Marchia Bond, Cardiac Imaging Nurse Navigator Burley Saver, Interim Cardiac Imaging Nurse [place] [facility] Direct Office Dial: [phone]   For scheduling needs, including cancellations and rescheduling, please call [place], [phone] (temporary number).      Follow-Up:  You have been referred to Lipid Clinic in [place]. We have requested you have a virtual visit. Someone will call you to schedule appointment. If you do not receive a call within the next 1-2 weeks, please call [phone].   At [facility], you and your health needs are our priority.  As part of our continuing mission to provide you with exceptional heart care, we have created designated Provider Care Teams.  These Care Teams include your primary Cardiologist (physician) and Advanced Practice Providers (APPs -  Physician Assistants and Nurse Practitioners) who all work together to provide you with the care you need, when you need it.  We recommend signing up for the patient portal called "MyChart".  Sign up information is provided on this After Visit Summary.  MyChart is used to connect with patients for Virtual Visits (Telemedicine).  Patients are able to view lab/test results, encounter notes, upcoming appointments, etc.  Non-urgent messages can be sent to your provider as well.   To learn more about what you can do with MyChart, go to [email or web address].    Your next appointment:   6-8 week(s)  After testing completed.   The format for your next appointment:   In Person  Provider:   You may see [name], MD or one of the following Advanced Practice Providers on your designated Care Team:    Murray Hodgkins, NP  Christell Faith, PA-C  Marrianne Mood, PA-C  Cadence Birchwood, [place]  Laurann Montana, NP    Cardiac CT Angiogram A cardiac CT angiogram is a procedure to look at the  heart and the area around the heart. It may be done to help find the cause of chest pains or other symptoms of heart disease. During this procedure, a substance called contrast dye is injected into the blood vessels in the area to be checked. A large X-ray machine, called a CT scanner, then takes detailed pictures of the heart and the surrounding area. The procedure is also sometimes called a coronary CT angiogram, coronary artery scanning, or CTA. A cardiac CT angiogram allows the health care provider to see how well blood is flowing to and from the heart. The health care provider will be able to see if there are any problems, such as:  Blockage or narrowing of the coronary arteries in the heart.  Fluid around the heart.  Signs of weakness or disease in the muscles, valves, and tissues of the heart. Tell a health care provider about:  Any allergies you have. This is especially important if you have had a previous allergic reaction to contrast dye.  All medicines you are taking, including vitamins, herbs, eye drops, creams, and  over-the-counter medicines.  Any blood disorders you have.  Any surgeries you have had.  Any medical conditions you have.  Whether you are pregnant or may be pregnant.  Any anxiety disorders, chronic pain, or other conditions you have that may increase your stress or prevent you from lying still. What are the risks? Generally, this is a safe procedure. However, problems may occur, including:  Bleeding.  Infection.  Allergic reactions to medicines or dyes.  Damage to other structures or organs.  Kidney damage from the contrast dye that is used.  Increased risk of cancer from radiation exposure. This risk is low. Talk with your health care provider about: ? The risks and benefits of testing. ? How you can receive the lowest dose of radiation. What happens before the procedure?  Wear comfortable clothing and remove any jewelry, glasses, dentures, and  hearing aids.  Follow instructions from your health care provider about eating and drinking. This may include: ? For 12 hours before the procedure -- avoid caffeine. This includes tea, coffee, soda, energy drinks, and diet pills. Drink plenty of water or other fluids that do not have caffeine in them. Being well hydrated can prevent complications. ? For 4-6 hours before the procedure -- stop eating and drinking. The contrast dye can cause nausea, but this is less likely if your stomach is empty.  Ask your health care provider about changing or stopping your regular medicines. This is especially important if you are taking diabetes medicines, blood thinners, or medicines to treat problems with erections (erectile dysfunction). What happens during the procedure?   Hair on your chest may need to be removed so that small sticky patches called electrodes can be placed on your chest. These will transmit information that helps to monitor your heart during the procedure.  An IV will be inserted into one of your veins.  You might be given a medicine to control your heart rate during the procedure. This will help to ensure that good images are obtained.  You will be asked to lie on an exam table. This table will slide in and out of the CT machine during the procedure.  Contrast dye will be injected into the IV. You might feel warm, or you may get a metallic taste in your mouth.  You will be given a medicine called nitroglycerin. This will relax or dilate the arteries in your heart.  The table that you are lying on will move into the CT machine tunnel for the scan.  The person running the machine will give you instructions while the scans are being done. You may be asked to: ? Keep your arms above your head. ? Hold your breath. ? Stay very still, even if the table is moving.  When the scanning is complete, you will be moved out of the machine.  The IV will be removed. The procedure may vary  among health care providers and hospitals. What can I expect after the procedure? After your procedure, it is common to have:  A metallic taste in your mouth from the contrast dye.  A feeling of warmth.  A headache from the nitroglycerin. Follow these instructions at home:  Take over-the-counter and prescription medicines only as told by your health care provider.  If you are told, drink enough fluid to keep your urine pale yellow. This will help to flush the contrast dye out of your body.  Most people can return to their normal activities right after the procedure. Ask your health  care provider what activities are safe for you.  It is up to you to get the results of your procedure. Ask your health care provider, or the department that is doing the procedure, when your results will be ready.  Keep all follow-up visits as told by your health care provider. This is important. Contact a health care provider if:  You have any symptoms of allergy to the contrast dye. These include: ? Shortness of breath. ? Rash or hives. ? A racing heartbeat. Summary  A cardiac CT angiogram is a procedure to look at the heart and the area around the heart. It may be done to help find the cause of chest pains or other symptoms of heart disease.  During this procedure, a large X-ray machine, called a CT scanner, takes detailed pictures of the heart and the surrounding area after a contrast dye has been injected into blood vessels in the area.  Ask your health care provider about changing or stopping your regular medicines before the procedure. This is especially important if you are taking diabetes medicines, blood thinners, or medicines to treat erectile dysfunction.  If you are told, drink enough fluid to keep your urine pale yellow. This will help to flush the contrast dye out of your body. This information is not intended to replace advice given to you by your health care provider. Make sure you  discuss any questions you have with your health care provider. Document Revised: 06/10/2019 Document Reviewed: 06/10/2019 Elsevier Patient Education  [employer].

## 2020-09-16 NOTE — Telephone Encounter (Signed)
That is good news on the cholesterol medicine. She should follow with that plan.  For the elevated hemoglobin. It is mild and it is caused by smoking actually, patients who smoke for many years cause their body to make more hemoglobin to improve oxygen. It is a natural side effect of smoking. Just notifying her that the test result was not in the normal range.  Treatment would be to quit smoking  Nobie Putnam, DO [facility] 09/16/2020, 9:14 AM

## 2020-09-16 NOTE — Telephone Encounter (Signed)
unable to reach the patient left detail message.

## 2020-09-18 NOTE — Telephone Encounter (Signed)
Attempted to contact pt.  Left vm that this was a courtesy call to ensure that she rec'd previous vm with information relayed from Dr. Parks Ranger.  Advised to call the office on Monday morning if she has questions re: the message.

## 2020-09-26 ENCOUNTER — Other Ambulatory Visit: Payer: Self-pay | Admitting: Internal Medicine

## 2020-09-26 ENCOUNTER — Other Ambulatory Visit: Payer: Self-pay | Admitting: Family Medicine

## 2020-09-26 DIAGNOSIS — M25511 Pain in right shoulder: Secondary | ICD-10-CM

## 2020-09-27 ENCOUNTER — Telehealth (HOSPITAL_COMMUNITY): Payer: Self-pay | Admitting: *Deleted

## 2020-09-27 NOTE — Telephone Encounter (Signed)
Attempted to call patient regarding upcoming cardiac CT appointment. Left message on voicemail with name and callback number  Kelsy Polack Tai RN Navigator Cardiac Imaging [facility] [phone] Office [phone] Cell  

## 2020-09-29 ENCOUNTER — Ambulatory Visit
Admission: RE | Admit: 2020-09-29 | Discharge: 2020-09-29 | Disposition: A | Discharge status: Home / Self Care | Payer: Medicare HMO | Source: Ambulatory Visit | Attending: Internal Medicine | Admitting: Internal Medicine

## 2020-09-29 ENCOUNTER — Other Ambulatory Visit: Payer: Self-pay

## 2020-09-29 DIAGNOSIS — R0600 Dyspnea, unspecified: Secondary | ICD-10-CM | POA: Insufficient documentation

## 2020-09-29 DIAGNOSIS — I25119 Atherosclerotic heart disease of native coronary artery with unspecified angina pectoris: Secondary | ICD-10-CM | POA: Insufficient documentation

## 2020-09-29 DIAGNOSIS — R0609 Other forms of dyspnea: Secondary | ICD-10-CM

## 2020-09-29 MED ORDER — IOHEXOL 350 MG/ML SOLN
75.0000 mL | Freq: Once | INTRAVENOUS | Status: AC | PRN
  Administered 2020-09-29: 75 mL via INTRAVENOUS

## 2020-09-29 MED ORDER — NITROGLYCERIN 0.4 MG SL SUBL
0.8000 mg | SUBLINGUAL_TABLET | Freq: Once | SUBLINGUAL | Status: AC
  Administered 2020-09-29: 0.8 mg via SUBLINGUAL

## 2020-09-29 NOTE — Progress Notes (Signed)
Patient tolerated procedure well. Ambulate w/o difficulty. Sitting in chair drinking water provided. Encouraged to drink extra water today and reasoning explained. Verbalized understanding. All questions answered. ABC intact. No further needs. Discharge from procedure area w/o issues.  

## 2020-09-30 ENCOUNTER — Telehealth: Payer: Self-pay | Admitting: *Deleted

## 2020-09-30 ENCOUNTER — Other Ambulatory Visit: Payer: Self-pay | Admitting: Family Medicine

## 2020-09-30 ENCOUNTER — Telehealth: Payer: Self-pay | Admitting: Pharmacist

## 2020-09-30 DIAGNOSIS — L239 Allergic contact dermatitis, unspecified cause: Secondary | ICD-10-CM

## 2020-09-30 DIAGNOSIS — J432 Centrilobular emphysema: Secondary | ICD-10-CM

## 2020-09-30 NOTE — Telephone Encounter (Signed)
No answer. Left detailed message with results, ok per DPR, and to call back if any questions on home number.  Cell phone said call cannot be completed at this time. Please try your call again later.

## 2020-09-30 NOTE — Telephone Encounter (Signed)
Called to schedule patient in lipid clinic for virtual apt per Dr. Saunders Revel. Left VM for pt to call back

## 2020-09-30 NOTE — Telephone Encounter (Signed)
-----   Message from [name], MD sent at 09/30/2020  7:57 AM EST ----- Please let Ms. Kristin Parker know that her cardiac CTA shows mild to moderate coronary artery disease without a severe blockage to explain her worsening shortness of breath.  We will proceed with echocardiogram scheduled for later this month to ensure that heart failure is not contributing to her symptoms.  In the meantime, I recommend that Ms. Kristin Parker be seen by our lipid clinic to discuss further treatment options.  She should continue her current medications for now

## 2020-10-07 DIAGNOSIS — Z961 Presence of intraocular lens: Secondary | ICD-10-CM | POA: Diagnosis not present

## 2020-10-07 DIAGNOSIS — H43812 Vitreous degeneration, left eye: Secondary | ICD-10-CM | POA: Diagnosis not present

## 2020-10-07 DIAGNOSIS — H2511 Age-related nuclear cataract, right eye: Secondary | ICD-10-CM | POA: Diagnosis not present

## 2020-10-07 DIAGNOSIS — H26492 Other secondary cataract, left eye: Secondary | ICD-10-CM | POA: Diagnosis not present

## 2020-10-12 ENCOUNTER — Ambulatory Visit (INDEPENDENT_AMBULATORY_CARE_PROVIDER_SITE_OTHER): Payer: Medicare HMO

## 2020-10-12 ENCOUNTER — Other Ambulatory Visit: Payer: Self-pay

## 2020-10-12 DIAGNOSIS — R0600 Dyspnea, unspecified: Secondary | ICD-10-CM

## 2020-10-12 DIAGNOSIS — R0609 Other forms of dyspnea: Secondary | ICD-10-CM

## 2020-10-12 DIAGNOSIS — I25119 Atherosclerotic heart disease of native coronary artery with unspecified angina pectoris: Secondary | ICD-10-CM | POA: Diagnosis not present

## 2020-10-12 LAB — ECHOCARDIOGRAM COMPLETE
AR max vel: 1.61 cm2
AV Area VTI: 1.8 cm2
AV Area mean vel: 1.58 cm2
AV Mean grad: 10 mmHg
AV Peak grad: 19.2 mmHg
Ao pk vel: 2.19 m/s
Area-P 1/2: 2.15 cm2
Calc EF: 54 %
S' Lateral: 2.5 cm
Single Plane A2C EF: 50.4 %
Single Plane A4C EF: 55.9 %

## 2020-10-20 DIAGNOSIS — I361 Nonrheumatic tricuspid (valve) insufficiency: Secondary | ICD-10-CM | POA: Diagnosis not present

## 2020-10-20 DIAGNOSIS — H2511 Age-related nuclear cataract, right eye: Secondary | ICD-10-CM | POA: Diagnosis not present

## 2020-10-20 DIAGNOSIS — E785 Hyperlipidemia, unspecified: Secondary | ICD-10-CM | POA: Diagnosis not present

## 2020-10-20 DIAGNOSIS — H25811 Combined forms of age-related cataract, right eye: Secondary | ICD-10-CM | POA: Diagnosis not present

## 2020-10-20 DIAGNOSIS — F320 Major depressive disorder, single episode, mild: Secondary | ICD-10-CM | POA: Diagnosis not present

## 2020-10-20 DIAGNOSIS — I209 Angina pectoris, unspecified: Secondary | ICD-10-CM | POA: Diagnosis not present

## 2020-10-20 DIAGNOSIS — H538 Other visual disturbances: Secondary | ICD-10-CM | POA: Diagnosis not present

## 2020-10-20 DIAGNOSIS — I10 Essential (primary) hypertension: Secondary | ICD-10-CM | POA: Diagnosis not present

## 2020-10-20 DIAGNOSIS — H26492 Other secondary cataract, left eye: Secondary | ICD-10-CM | POA: Diagnosis not present

## 2020-10-20 DIAGNOSIS — J449 Chronic obstructive pulmonary disease, unspecified: Secondary | ICD-10-CM | POA: Diagnosis not present

## 2020-10-20 DIAGNOSIS — H43812 Vitreous degeneration, left eye: Secondary | ICD-10-CM | POA: Diagnosis not present

## 2020-10-27 ENCOUNTER — Other Ambulatory Visit: Payer: Self-pay | Admitting: Family Medicine

## 2020-10-27 DIAGNOSIS — J3089 Other allergic rhinitis: Secondary | ICD-10-CM

## 2020-10-27 NOTE — Telephone Encounter (Signed)
Requested medications are due for refill today yes  Requested medications are on the active medication list yes  Last refill 11/29  Last visit More than one year ago.  Future visit scheduled May 2022  Notes to clinic Failed protocol of valid visit within 12 months

## 2020-11-09 ENCOUNTER — Ambulatory Visit: Payer: Medicare HMO | Admitting: Family

## 2020-11-28 ENCOUNTER — Other Ambulatory Visit: Payer: Self-pay

## 2020-11-28 ENCOUNTER — Ambulatory Visit (INDEPENDENT_AMBULATORY_CARE_PROVIDER_SITE_OTHER): Payer: Medicare HMO | Admitting: Family

## 2020-11-28 ENCOUNTER — Encounter: Payer: Self-pay | Admitting: Family

## 2020-11-28 VITALS — BP 130/60 | HR 60 | Ht 67.0 in | Wt 161.0 lb

## 2020-11-28 DIAGNOSIS — Z789 Other specified health status: Secondary | ICD-10-CM | POA: Diagnosis not present

## 2020-11-28 DIAGNOSIS — M791 Myalgia, unspecified site: Secondary | ICD-10-CM

## 2020-11-28 DIAGNOSIS — Z720 Tobacco use: Secondary | ICD-10-CM | POA: Diagnosis not present

## 2020-11-28 DIAGNOSIS — R0609 Other forms of dyspnea: Secondary | ICD-10-CM

## 2020-11-28 DIAGNOSIS — I10 Essential (primary) hypertension: Secondary | ICD-10-CM

## 2020-11-28 DIAGNOSIS — E785 Hyperlipidemia, unspecified: Secondary | ICD-10-CM

## 2020-11-28 DIAGNOSIS — I340 Nonrheumatic mitral (valve) insufficiency: Secondary | ICD-10-CM | POA: Diagnosis not present

## 2020-11-28 DIAGNOSIS — I25119 Atherosclerotic heart disease of native coronary artery with unspecified angina pectoris: Secondary | ICD-10-CM | POA: Diagnosis not present

## 2020-11-28 DIAGNOSIS — R0600 Dyspnea, unspecified: Secondary | ICD-10-CM

## 2020-11-28 DIAGNOSIS — I6523 Occlusion and stenosis of bilateral carotid arteries: Secondary | ICD-10-CM | POA: Diagnosis not present

## 2020-11-28 NOTE — Patient Instructions (Addendum)
Medication Instructions:  Continue your current medications.   *If you need a refill on your cardiac medications before your next appointment, please call your pharmacy*  Lab Work: None ordered today.  Testing/Procedures: Your echocardiogram showed normal heart pumping function. It shows your heart is mildly stiff which we will control by keeping your blood pressure well controlled. Your mitral valve was trivially leaky - which is very minimal.   Your cardiac CT showed mild non-obstructive coronary disease. You had two heart blood vessels were 29-45% blocked. This does not require a stent nor intervention. We will prevent this from getting worse by keeping your blood pressure and cholesterol well controlled.   Follow-Up: At [facility], you and your health needs are our priority.  As part of our continuing mission to provide you with exceptional heart care, we have created designated Provider Care Teams.  These Care Teams include your primary Cardiologist (physician) and Advanced Practice Providers (APPs -  Physician Assistants and Nurse Practitioners) who all work together to provide you with the care you need, when you need it.  We recommend signing up for the patient portal called "MyChart".  Sign up information is provided on this After Visit Summary.  MyChart is used to connect with patients for Virtual Visits (Telemedicine).  Patients are able to view lab/test results, encounter notes, upcoming appointments, etc.  Non-urgent messages can be sent to your provider as well.   To learn more about what you can do with MyChart, go to [email or web address].    Your next appointment:   3 month(s)  The format for your next appointment:   In Person  Provider:   You may see [name], MD or one of the following Advanced Practice Providers on your designated Care Team:    Murray Hodgkins, NP  Christell Faith, PA-C  Marrianne Mood, PA-C  Cadence Kathlen Mody, [place]  Laurann Montana,  NP  Other Instructions  Heart Healthy Diet Recommendations: A low-salt diet is recommended. Meats should be grilled, baked, or boiled. Avoid fried foods. Focus on lean protein sources like fish or chicken with vegetables and fruits. The American Heart Association is a Microbiologist!  American Heart Association Diet and Lifeystyle Recommendations   Exercise recommendations: The American Heart Association recommends 150 minutes of moderate intensity exercise weekly. Try 30 minutes of moderate intensity exercise 4-5 times per week. This could include walking, jogging, or swimming.  Managing the Challenge of Quitting Smoking Quitting smoking is a physical and mental challenge. You will face cravings, withdrawal symptoms, and temptation. Before quitting, work with your health care provider to make a plan that can help you manage quitting. Preparation can help you quit and keep you from giving in. How to manage lifestyle changes Managing stress Stress can make you want to smoke, and wanting to smoke may cause stress. It is important to find ways to manage your stress. You might try some of the following:  Practice relaxation techniques. ? Breathe slowly and deeply, in through your nose and out through your mouth. ? Listen to music. ? Soak in a bath or take a shower. ? Imagine a peaceful place or vacation.  Get some support. ? Talk with family or friends about your stress. ? Join a support group. ? Talk with a counselor or therapist.  Get some physical activity. ? Go for a walk, run, or bike ride. ? Play a favorite sport. ? Practice yoga.   Medicines Talk with your health care provider about medicines that might help  you deal with cravings and make quitting easier for you. Relationships Social situations can be difficult when you are quitting smoking. To manage this, you can:  Avoid parties and other social situations where people might be smoking.  Avoid alcohol.  Leave right  away if you have the urge to smoke.  Explain to your family and friends that you are quitting smoking. Ask for support and let them know you might be a bit grumpy.  Plan activities where smoking is not an option. General instructions Be aware that many people gain weight after they quit smoking. However, not everyone does. To keep from gaining weight, have a plan in place before you quit and stick to the plan after you quit. Your plan should include:  Having healthy snacks. When you have a craving, it may help to: ? Eat popcorn, carrots, celery, or other cut vegetables. ? Chew sugar-free gum.  Changing how you eat. ? Eat small portion sizes at meals. ? Eat 4-6 small meals throughout the day instead of 1-2 large meals a day. ? Be mindful when you eat. Do not watch television or do other things that might distract you as you eat.  Exercising regularly. ? Make time to exercise each day. If you do not have time for a long workout, do short bouts of exercise for 5-10 minutes several times a day. ? Do some form of strengthening exercise, such as weight lifting. ? Do some exercise that gets your heart beating and causes you to breathe deeply, such as walking fast, running, swimming, or biking. This is very important.  Drinking plenty of water or other low-calorie or no-calorie drinks. Drink 6-8 glasses of water daily.   How to recognize withdrawal symptoms Your body and mind may experience discomfort as you try to get used to not having nicotine in your system. These effects are called withdrawal symptoms. They may include:  Feeling hungrier than normal.  Having trouble concentrating.  Feeling irritable or restless.  Having trouble sleeping.  Feeling depressed.  Craving a cigarette. To manage withdrawal symptoms:  Avoid places, people, and activities that trigger your cravings.  Remember why you want to quit.  Get plenty of sleep.  Avoid coffee and other caffeinated drinks.  These may worsen some of your symptoms. These symptoms may surprise you. But be assured that they are normal to have when quitting smoking. How to manage cravings Come up with a plan for how to deal with your cravings. The plan should include the following:  A definition of the specific situation you want to deal with.  An alternative action you will take.  A clear idea for how this action will help.  The name of someone who might help you with this. Cravings usually last for 5-10 minutes. Consider taking the following actions to help you with your plan to deal with cravings:  Keep your mouth busy. ? Chew sugar-free gum. ? Suck on hard candies or a straw. ? Brush your teeth.  Keep your hands and body busy. ? Change to a different activity right away. ? Squeeze or play with a ball. ? Do an activity or a hobby, such as making bead jewelry, practicing needlepoint, or working with wood. ? Mix up your normal routine. ? Take a short exercise break. Go for a quick walk or run up and down stairs.  Focus on doing something kind or helpful for someone else.  Call a friend or family member to talk during a craving.  Join a support group.  Contact a quitline. Where to find support To get help or find a support group:  Call the [facility]'s Smoking Quitline: 1-800-QUIT NOW 603-204-9793)  Visit the website of the Substance Abuse and [place]: ktimeonline.com  Text QUIT to SmokefreeTXT: 767209 Where to find more information Visit these websites to find more information on quitting smoking:  [place]: www.smokefree.gov  American Lung Association: www.lung.org  American Cancer Society: www.cancer.org  Centers for Disease Control and Prevention: http://www.wolf.info/  American Heart Association: www.heart.org Contact a health care provider if:  You want to change your plan for quitting.  The medicines you are taking are not  helping.  Your eating feels out of control or you cannot sleep. Get help right away if:  You feel depressed or become very anxious. Summary  Quitting smoking is a physical and mental challenge. You will face cravings, withdrawal symptoms, and temptation to smoke again. Preparation can help you as you go through these challenges.  Try different techniques to manage stress, handle social situations, and prevent weight gain.  You can deal with cravings by keeping your mouth busy (such as by chewing gum), keeping your hands and body busy, calling family or friends, or contacting a quitline for people who want to quit smoking.  You can deal with withdrawal symptoms by avoiding places where people smoke, getting plenty of rest, and avoiding drinks with caffeine. This information is not intended to replace advice given to you by your health care provider. Make sure you discuss any questions you have with your health care provider. Document Revised: 08/04/2019 Document Reviewed: 08/04/2019 Elsevier Patient Education  [place].

## 2020-11-28 NOTE — Progress Notes (Signed)
Office Visit    Patient Name: Kristin Parker Date of Encounter: 11/28/2020  Primary Care Provider:  Olin Hauser, DO Primary Cardiologist:  [name], MD Electrophysiologist:  None   Chief Complaint    Kristin Parker is a 70 y.o. female with a hx of  CAD (up to 40% distal LMCA by cath 05/2017), carotid artery stenosis, HTN, HLD, depression, anxiety, short chronic shortness of breath, tobacco use  presents today for follow-up after cardiac testing  Past Medical History    Past Medical History:  Diagnosis Date  . Anxiety   . Aortic valve sclerosis   . Bilateral carotid bruits   . Carotid artery stenosis   . Hypertension   . Mitral regurgitation   . Mixed hyperlipidemia   . Shoulder pain   . Sleeping difficulties    Past Surgical History:  Procedure Laterality Date  . BREAST BIOPSY Left 08/13/2018   affirm bx, x clip, path pending  . CATARACT EXTRACTION Left    2017  . COLONOSCOPY  2012   Dr Candace Cruise  . LEFT HEART CATH AND CORONARY ANGIOGRAPHY N/A 06/25/2017   Procedure: LEFT HEART CATH AND CORONARY ANGIOGRAPHY;  Surgeon: [name], MD;  Location: [facility];  Service: Cardiovascular;  Laterality: N/A;    Allergies  Allergies  Allergen Reactions  . Bactrim [Sulfamethoxazole-Trimethoprim] Nausea And Vomiting  . Aleve [Naproxen Sodium] Swelling    Lips. But patient can take Ibuprofen without problems    History of Present Illness    Kristin Parker is a 70 y.o. female with a hx of  CAD (up to 40% distal LMCA by cath 05/2017), carotid artery stenosis, HTN, HLD, statin intolerance with myalgia, depression, anxiety, short chronic shortness of breath, tobacco use last seen 09/15/2020 by Dr. Saunders Revel.  Echo 02/2017 with LVEF 60-65%, gr1DD, mild MR, normal PASP. Carotid duplex 02/2018 with 40-59% RICA stenosis and 123456 LICA stenosis.  Cardiac cath 05/2017 with up to 40% stenosis of distal LMCA. She has a longstanding history of dyspnea on  exertion.   At clinic visit 07/19/2019 she noted myalgias and was given a statin holiday of 1 month.  Her myalgias resolved and she has been able to tolerate Zetia without difficulty.    She had a repeat carotid duplex 10/08/2019 bilateral ICA with 1-39% stenosis.  She was seen by Dr. Saunders Revel  09/15/2020 and noted worsening exertional dyspnea as well as more forceful pounding over the last 3 to 4 months.  She was recommended for cardiac CTA as well as echocardiogram.  Cardiac CTA 09/29/2020 showed coronary calcium score of 192 placing her in the 79th percentile for age/sex matched control. She had calcified plaque causing mild stenosis Echocardiogram 10/12/2020 showed LVEF 55 to 60%, no wall motion abnormalities, mild LVH, grade 1 diastolic dysfunction, LV global longitudinal strain -15.2%, trivial MR, mild aortic valve sclerosis without stenosis.  Presents today for follow-up after cardiac testing.  Reviewed her cardiac testing in detail and she was appreciative of the understanding. Tells me she has quit smoking since 11/04/2020 -congratulated on nearly 3.5 weeks of complete smoking cessation.  She reports no chest pain, pressure, tightness.  Reports stable dyspnea on exertion.  We had a long discussion regarding secondary prevention of coronary artery disease.  Tells me she has been walking some at home and encouraged to continue.  Encouraged her to follow-up with her primary care provider regarding her COPD.  Offered referral to pulmonology which she politely declines.  Her chief  complaint today is that her abdomen feels bloated.  We discussed that this is not fluid retention and she has normal LVEF, recent liver enzymes normal.  Encouraged to follow-up with primary care provider.  EKGs/Labs/Other Studies Reviewed:   The following studies were reviewed today:  Echo 10/12/20  1. Left ventricular ejection fraction, by estimation, is 55 to 60%. The  left ventricle has normal function. The left  ventricle has no regional  wall motion abnormalities. There is mild left ventricular hypertrophy.  Left ventricular diastolic parameters  are consistent with Grade I diastolic dysfunction (impaired relaxation).  The average left ventricular global longitudinal strain is -15.2 %. The  global longitudinal strain is abnormal.   2. Right ventricular systolic function is normal. The right ventricular  size is normal.   3. The mitral valve is normal in structure. Trivial mitral valve  regurgitation.   4. The aortic valve is tricuspid. Aortic valve regurgitation is not  visualized. Mild aortic valve sclerosis is present, with no evidence of  aortic valve stenosis.   5. The inferior vena cava is normal in size with greater than 50%  respiratory variability, suggesting right atrial pressure of 3 mmHg.   Cardiac CT 09/29/2020 IMPRESSION: 1. Coronary calcium score of 192. This was 79th percentile for age and sex matched control.   2. Normal coronary origin with left dominance.   3. Calcified plaque causing mild stenosis in the proximal LM and LCx   4. Moderate ascending and descending aortic wall calcifications   5. Grade 3 atheroma in the descending thoracic aorta   6. CAD-RADS 2. Mild non-obstructive CAD (25-49%). Consider non-atherosclerotic causes of chest pain. Consider preventive therapy and risk factor modification. Recommend asa, statin if no contraindication.    Carotid Duplex 10/08/19: Right Carotid: Velocities in the right ICA are consistent with a 1-39% stenosis. Non-hemodynamically significant plaque <50% noted in the  CCA. The ECA appears <50% stenosed. Left Carotid: Velocities in the left ICA are consistent with a 1-39% stenosis. Non-hemodynamically significant plaque <50% noted in the CCA. The ECA appears <50% stenosed.    LHC (06/25/17): Mild to moderate CAD, including 40% distal LMCA, 20% ostial LAD, 20-30% mid LAD, and sequential 30% proximal and mid LCx stenoses.  Small,  nondominant RCA without significant disease.  Normal left ventricular filling pressure.    ETT (06/18/17): Abnormal exercise tolerance test, the target heart rate not achieved.  1-2 mm horizontal ST depressions noted in inferior leads.  Patient only achieved 84% MPHR due to shortness of breath and fatigue   Carotid artery Duplex (03/01/17): Smooth plaque, bilaterally. 40-59% RICA stenosis. 5-40% LICA stenosis. Patent vertebral arteries with antegrade flow. Normal subclavian arteries, bilaterally.   TTE (03/01/17): Normal LV size and function with LVEF 60-65% and grade 1 diastolic dysfunction. Mild MR. Normal RV size and function. Normal PA pressure.   EKG: No EKG today  Recent Labs: 09/13/2020: ALT 13; BUN 15; Creat 1.00; Hemoglobin 16.8; Platelets 348; Potassium 4.5; Sodium 137  Recent Lipid Panel    Component Value Date/Time   CHOL 305 (H) 09/13/2020 0755   CHOL 135 07/22/2019 1034   TRIG 314 (H) 09/13/2020 0755   HDL 45 (L) 09/13/2020 0755   HDL 43 07/22/2019 1034   CHOLHDL 6.8 (H) 09/13/2020 0755   VLDL NOT CALCULATED 05/28/2017 1008   LDLCALC 207 (H) 09/13/2020 0755   LDLDIRECT 62 05/28/2017 1008    Home Medications   Current Meds  Medication Sig  . Prednisol Ace-Moxiflox-Bromfen 1-0.5-0.075 % SUSP  Apply to eye.     Review of Systems  All other systems reviewed and are otherwise negative except as noted above.  Physical Exam    VS:  There were no vitals taken for this visit. , BMI There is no height or weight on file to calculate BMI.  Wt Readings from Last 3 Encounters:  09/15/20 151 lb 6.4 oz (68.7 kg)  09/15/20 152 lb (68.9 kg)  09/13/20 151 lb 3.2 oz (68.6 kg)    GEN: Well nourished, overweight, well developed, in no acute distress. HEENT: normal. Neck: Supple, no JVD, carotid bruits, or masses. Cardiac: RRR, no murmurs, rubs, or gallops. No clubbing, cyanosis, edema.  Radials/DP/PT 2+ and equal bilaterally.  Respiratory:  Respirations regular and unlabored, clear  to auscultation bilaterally. GI: Soft, nontender, nondistended. MS: No deformity or atrophy. Skin: Warm and dry, no rash. Neuro:  Strength and sensation are intact. Psych: Normal affect.  Assessment & Plan    1. CAD-cardiac CTA 09/2020 with coronary calcium score 192, 79th percentile for age/sex withCAD-RADS 2 mild nonobstructive CAD (25-49%) of the LM and LCx.Marland Kitchen GDMT includes aspirin,, Zetia.  No statin secondary to previous myalgias  2. DOE / COPD- Cardiac CTA and echocardiogram unrevealing for etiology of her dyspnea.  Likely related to her COPD and long history of tobacco use.  Politely declines pulmonology referral.  Deconditioning also likely contributory.  Encouraged to follow-up with primary care provider.  3. Mitral regurgitation-trivial by echo 09/2020.  Continue optimal BP and volume control.  4. Carotid artery stenosis-carotid duplex 10/08/2019 with bilateral 1-79% stenosis.  Cholesterol management, as below.  Continue aspirin 81 mg daily.  5. Hypertension- BP well controlled. Continue current antihypertensive regimen of amlodipine 10 mg daily, Imdur 60 mg daily, losartan 100 mg daily..   6. Myalgias / Statin intolerance - Secondary to statin. Resolved since discontinuation of statin.  7. Hyperlipidemia, LDL goal less than 70-  Upcoming apopintment 12/28/20 with Dr. Debara Pickett of lipid clinic for hyperlipidemia with statin intolerance.  She is previously been on simvastatin 20 mg as well as rosuvastatin 40 mg with myalgias.  She tolerates Zetia 10 mg daily without difficulty.  Lipid panel 09/13/2020 total cholesterol 305, HDL 45, LDL 207, triglycerides 314.  8. Tobacco use - Has not smoked in 3 weeks.  Congratulated.  Encouraged complete smoking cessation and provided her resources on coping with quitting smoking.  Disposition: Follow up in 3 month(s) with Dr. Saunders Revel or APP   Signed, Loel Dubonnet, NP 11/28/2020, 9:58 AM [place]

## 2020-11-30 ENCOUNTER — Encounter: Payer: Self-pay | Admitting: Internal Medicine

## 2020-11-30 ENCOUNTER — Ambulatory Visit (INDEPENDENT_AMBULATORY_CARE_PROVIDER_SITE_OTHER): Payer: Medicare HMO | Admitting: Internal Medicine

## 2020-11-30 ENCOUNTER — Other Ambulatory Visit: Payer: Self-pay

## 2020-11-30 VITALS — BP 126/80 | HR 64 | Ht 67.0 in | Wt 162.0 lb

## 2020-11-30 DIAGNOSIS — M791 Myalgia, unspecified site: Secondary | ICD-10-CM

## 2020-11-30 DIAGNOSIS — I6523 Occlusion and stenosis of bilateral carotid arteries: Secondary | ICD-10-CM | POA: Diagnosis not present

## 2020-11-30 DIAGNOSIS — E785 Hyperlipidemia, unspecified: Secondary | ICD-10-CM

## 2020-11-30 DIAGNOSIS — I25119 Atherosclerotic heart disease of native coronary artery with unspecified angina pectoris: Secondary | ICD-10-CM

## 2020-11-30 DIAGNOSIS — I739 Peripheral vascular disease, unspecified: Secondary | ICD-10-CM | POA: Diagnosis not present

## 2020-11-30 DIAGNOSIS — T466X5A Adverse effect of antihyperlipidemic and antiarteriosclerotic drugs, initial encounter: Secondary | ICD-10-CM

## 2020-11-30 MED ORDER — ROSUVASTATIN CALCIUM 5 MG PO TABS: 5.0000 mg | ORAL_TABLET | Freq: Every day | ORAL | 3 refills | Status: DC

## 2020-11-30 NOTE — Patient Instructions (Signed)
Medication Instructions:  Your physician has recommended you make the following change in your medication: START crestor 5mg  daily  *If you need a refill on your cardiac medications before your next appointment, please call your pharmacy*   Lab Work: FASTING lipid panel in 3-4 months to check cholesterol   If you have labs (blood work) drawn today and your tests are completely normal, you will receive your results only by: Marland Kitchen MyChart Message (if you have MyChart) OR . A paper copy in the mail If you have any lab test that is abnormal or we need to change your treatment, we will call you to review the results.   Testing/Procedures: NONE   Follow-Up: At [facility], you and your health needs are our priority.  As part of our continuing mission to provide you with exceptional heart care, we have created designated Provider Care Teams.  These Care Teams include your primary Cardiologist (physician) and Advanced Practice Providers (APPs -  Physician Assistants and Nurse Practitioners) who all work together to provide you with the care you need, when you need it.  We recommend signing up for the patient portal called "MyChart".  Sign up information is provided on this After Visit Summary.  MyChart is used to connect with patients for Virtual Visits (Telemedicine).  Patients are able to view lab/test results, encounter notes, upcoming appointments, etc.  Non-urgent messages can be sent to your provider as well.   To learn more about what you can do with MyChart, go to [email or web address].    Your next appointment:   3-4 month(s) - lipid clinic  The format for your next appointment:   In Person  Provider:   K. Mali Hilty, MD   Other Instructions

## 2020-11-30 NOTE — Progress Notes (Signed)
LIPID CLINIC CONSULT NOTE  Chief Complaint:  Manage dyslipidemia  Primary Care Physician: Olin Hauser, DO  Primary Cardiologist:  [name], MD  HPI:  Kristin Parker is a 70 y.o. female who is being seen today for the evaluation of dyslipidemia at the request of End, Harrell Gave, MD.  Kristin Parker is a pleasant 70 year old female kindly referred by Laurann Montana, NP on behalf of Dr. Saunders Revel tree of coronary artery disease.  She had cardiac catheterization which showed mild to moderate nonobstructive coronary disease in 2018 and a recent CT coronary angiogram showing some similar disease including some distal left main disease.  She also has PAD and significant aortic atherosclerosis.  She denies a family history of high cholesterol or early onset heart disease however more recently lipid profile showed significant dyslipidemia with a total cholesterol 305, HDL 45, LDL 207 and triglycerides 314.  Hemoglobin A1c is normal in a nondiabetic range.  Thyroid function was also normal.  Unfortunately, she is not been able to tolerate high potency rosuvastatin, atorvastatin and simvastatin in the past and then was switched to ezetimibe 10 mg daily.  She has been on that for a while and her lipids in November as referenced above included being on that, therefore her cholesterol is likely about 25% higher.  Her daughter was here today and we discussed the patient's diet as well indicating that there probably is a significant amount of saturated fat intake including a lot of processed and preprepared foods.  PMHx:  Past Medical History:  Diagnosis Date  . Anxiety   . Aortic valve sclerosis   . Bilateral carotid bruits   . Carotid artery stenosis   . Hypertension   . Mitral regurgitation   . Mixed hyperlipidemia   . Shoulder pain   . Sleeping difficulties     Past Surgical History:  Procedure Laterality Date  . BREAST BIOPSY Left 08/13/2018   affirm bx, x clip, path pending   . CATARACT EXTRACTION Left    2017  . COLONOSCOPY  2012   Dr Candace Cruise  . LEFT HEART CATH AND CORONARY ANGIOGRAPHY N/A 06/25/2017   Procedure: LEFT HEART CATH AND CORONARY ANGIOGRAPHY;  Surgeon: [name], MD;  Location: [facility];  Service: Cardiovascular;  Laterality: N/A;    FAMHx:  Family History  Problem Relation Age of Onset  . Cancer Mother        bladder cancer  . Heart disease Mother 57       Pacemaker  . Cancer Father        lung  . Multiple sclerosis Sister   . Valvular heart disease Brother 74       s/p bioprosthetic valve replacement at [facility]  . Breast cancer Neg Hx     SOCHx:   reports that she quit smoking about 4 weeks ago. Her smoking use included cigarettes. She has a 15.00 pack-year smoking history. She uses smokeless tobacco. She reports that she does not drink alcohol and does not use drugs.  ALLERGIES:  Allergies  Allergen Reactions  . Bactrim [Sulfamethoxazole-Trimethoprim] Nausea And Vomiting  . Aleve [Naproxen Sodium] Swelling    Lips. But patient can take Ibuprofen without problems    ROS: Pertinent items noted in HPI and remainder of comprehensive ROS otherwise negative.  HOME MEDS: Current Outpatient Medications on File Prior to Visit  Medication Sig Dispense Refill  . albuterol (VENTOLIN HFA) 108 (90 Base) MCG/ACT inhaler INHALE 2 PUFFS EVERY 4 HOURS FOR WHEEZING  OR SHORTNESS OF BREATH AS NEEDED 18 g 0  . amLODipine (NORVASC) 10 MG tablet Take 1 tablet (10 mg total) by mouth daily. 90 tablet 3  . aspirin EC 81 MG tablet Take 1 tablet (81 mg total) by mouth daily.    Marland Kitchen ezetimibe (ZETIA) 10 MG tablet TAKE 1 TABLET BY MOUTH ONCE DAILY 90 tablet 0  . fluticasone (FLONASE) 50 MCG/ACT nasal spray USE 2 SPRAYS INTO EACH NOSTRIL ONCE DAILY 16 g 5  . hydrOXYzine (ATARAX/VISTARIL) 10 MG tablet TAKE 1 TABLET BY MOUTH AT BEDTIME AS NEEDED ANXIETY/SLEEP 30 tablet 0  . isosorbide mononitrate (IMDUR) 60 MG 24 hr tablet TAKE 1 TABLET BY MOUTH ONCE  DAILY 90 tablet 0  . losartan (COZAAR) 100 MG tablet Take 1 tablet (100 mg total) by mouth daily. 90 tablet 3  . meloxicam (MOBIC) 15 MG tablet TAKE 1 TABLET BY MOUTH ONCE DAILY AS NEEDED FOR PAIN WITH FOOD 30 tablet 5  . Omega-3 Fatty Acids (FISH OIL) 1000 MG CAPS Take 1 capsule (1,000 mg total) by mouth 2 (two) times daily. 60 capsule 0  . sertraline (ZOLOFT) 100 MG tablet Take 1 tablet (100 mg total) by mouth daily. 90 tablet 3  . SPIRIVA HANDIHALER 18 MCG inhalation capsule INHALE CONTENTS OF 1 CAPSULE ONCE DAILY 30 capsule 11   No current facility-administered medications on file prior to visit.    LABS/IMAGING: No results found for this or any previous visit (from the past 48 hour(s)). No results found.  LIPID PANEL:    Component Value Date/Time   CHOL 305 (H) 09/13/2020 0755   CHOL 135 07/22/2019 1034   TRIG 314 (H) 09/13/2020 0755   HDL 45 (L) 09/13/2020 0755   HDL 43 07/22/2019 1034   CHOLHDL 6.8 (H) 09/13/2020 0755   VLDL NOT CALCULATED 05/28/2017 1008   LDLCALC 207 (H) 09/13/2020 0755   LDLDIRECT 62 05/28/2017 1008    WEIGHTS: Wt Readings from Last 3 Encounters:  11/30/20 162 lb (73.5 kg)  11/28/20 161 lb (73 kg)  09/15/20 151 lb 6.4 oz (68.7 kg)    VITALS: BP 126/80   Pulse 64   Ht 5\' 7"  (1.702 m)   Wt 162 lb (73.5 kg)   SpO2 94%   BMI 25.37 kg/m   EXAM: General appearance: alert and no distress Neck: no carotid bruit, no JVD and thyroid not enlarged, symmetric, no tenderness/mass/nodules Lungs: clear to auscultation bilaterally Heart: regular rate and rhythm Abdomen: soft, non-tender; bowel sounds normal; no masses,  no organomegaly Extremities: extremities normal, atraumatic, no cyanosis or edema Pulses: 2+ and symmetric Skin: Skin color, texture, turgor normal. No rashes or lesions Neurologic: Grossly normal Psych: Pleasant   *Exam chaperoned by Sheral Apley, RN  EKG: Deferred  ASSESSMENT: 1. Mixed dyslipidemia, goal LDL <70 2. Mild to  moderate non-obstructive CAD 3. PAD 4. Atherogenic diet  PLAN: 1.   Kristin Parker has a mixed dyslipidemia with very high LDL and elevated triglycerides.  I suspect it may be a significant dietary component to this based on discussion with her daughter and the patient.  It does not seem that there is a significant history of heart problems in the family or early onset heart disease.  We provided nutritional information for her today.  Hopefully she can work on this and more physical activity.  She is agreeable to try low dose of high potency rosuvastatin 5 mg daily.  She will continue on ezetimibe.  We will plan repeat lipids if  this is tolerated in 3 months.  At that time if after dietary modification and the addition of medication she remains well above target, then would recommend a PCSK9 inhibitor.  Thanks for the kind referral.  Pixie Casino, MD, FACC, [place] Director of the [facility]  [facility]ovascular [facility] Diplomate of the American Board of Clinical Lipidology Attending Cardiologist  Direct Dial: [phone]  Fax: [phone]  Website:  [email or web address] 11/30/2020, 2:07 PM

## 2020-12-10 ENCOUNTER — Other Ambulatory Visit: Payer: Self-pay | Admitting: Family Medicine

## 2020-12-10 DIAGNOSIS — J432 Centrilobular emphysema: Secondary | ICD-10-CM

## 2020-12-28 ENCOUNTER — Ambulatory Visit: Payer: Medicare HMO | Admitting: Internal Medicine

## 2021-01-27 ENCOUNTER — Telehealth: Payer: Self-pay | Admitting: Family Medicine

## 2021-01-27 NOTE — Telephone Encounter (Signed)
Copied from [place] [id number]. Topic: Medicare AWV >> Jan 27, 2021  2:07 PM Cher Nakai R wrote: Reason for CRM:  Left message for patient to call back and schedule the Medicare Annual Wellness Visit (AWV) virtually or by telephone.  Last AWV 07/07/2019  Please schedule at anytime with [place].  40 minute appointment  Any questions, please call me at [phone]

## 2021-01-28 ENCOUNTER — Other Ambulatory Visit: Payer: Self-pay | Admitting: Family Medicine

## 2021-01-28 ENCOUNTER — Other Ambulatory Visit: Payer: Self-pay | Admitting: Internal Medicine

## 2021-01-28 DIAGNOSIS — L239 Allergic contact dermatitis, unspecified cause: Secondary | ICD-10-CM

## 2021-01-28 NOTE — Telephone Encounter (Signed)
Requested Prescriptions  Pending Prescriptions Disp Refills  . hydrOXYzine (ATARAX/VISTARIL) 10 MG tablet [Pharmacy Med Name: HYDROXYZINE HCL 10 MG TAB] 30 tablet 0    Sig: TAKE 1 TABLET BY MOUTH AT BEDTIME AS NEEDED ANXIETY/SLEEP     Ear, Nose, and Throat:  Antihistamines Passed - 01/28/2021 12:01 PM      Passed - Valid encounter within last 12 months    Recent Outpatient Visits          4 months ago Mixed hyperlipidemia   [place], DO   1 year ago Urinary tract infection due to extended-spectrum beta lactamase (ESBL) producing Escherichia coli   [place], DO   1 year ago Acute cystitis without hematuria   [place], DO   2 years ago Urinary tract infection due to extended-spectrum beta lactamase (ESBL) producing Escherichia coli   [place], DO   2 years ago Acute cystitis with hematuria   [place], Devonne Doughty, DO      Future Appointments            In 3 weeks [place] Rile, Martie Lee, NP [facility], [place]   In 1 month [place], Devonne Doughty, [facility], [place]   In 1 month Hilty, Nadean Corwin, MD [place], [facility]

## 2021-01-30 NOTE — Telephone Encounter (Signed)
Rx request sent to pharmacy.  

## 2021-02-08 ENCOUNTER — Ambulatory Visit
Admission: RE | Admit: 2021-02-08 | Discharge: 2021-02-08 | Disposition: A | Discharge status: Home / Self Care | Payer: Medicare HMO | Source: Home / Self Care | Attending: Family Medicine | Admitting: Family Medicine

## 2021-02-08 ENCOUNTER — Encounter: Payer: Self-pay | Admitting: Family Medicine

## 2021-02-08 ENCOUNTER — Other Ambulatory Visit: Payer: Self-pay

## 2021-02-08 ENCOUNTER — Ambulatory Visit (INDEPENDENT_AMBULATORY_CARE_PROVIDER_SITE_OTHER): Payer: Medicare HMO | Admitting: Family Medicine

## 2021-02-08 ENCOUNTER — Ambulatory Visit
Admission: RE | Admit: 2021-02-08 | Discharge: 2021-02-08 | Disposition: A | Discharge status: Home / Self Care | Payer: Medicare HMO | Source: Ambulatory Visit | Attending: Family Medicine | Admitting: Family Medicine

## 2021-02-08 VITALS — BP 150/64 | HR 63 | Ht 67.0 in | Wt 168.2 lb

## 2021-02-08 DIAGNOSIS — M25511 Pain in right shoulder: Secondary | ICD-10-CM | POA: Diagnosis not present

## 2021-02-08 DIAGNOSIS — K5900 Constipation, unspecified: Secondary | ICD-10-CM | POA: Insufficient documentation

## 2021-02-08 DIAGNOSIS — R1011 Right upper quadrant pain: Secondary | ICD-10-CM | POA: Insufficient documentation

## 2021-02-08 DIAGNOSIS — R140 Abdominal distension (gaseous): Secondary | ICD-10-CM | POA: Diagnosis not present

## 2021-02-08 DIAGNOSIS — G8929 Other chronic pain: Secondary | ICD-10-CM

## 2021-02-08 DIAGNOSIS — R109 Unspecified abdominal pain: Secondary | ICD-10-CM | POA: Diagnosis not present

## 2021-02-08 MED ORDER — DICYCLOMINE HCL 10 MG PO CAPS: 10.0000 mg | ORAL_CAPSULE | Freq: Three times a day (TID) | ORAL | 0 refills | Status: DC

## 2021-02-08 MED ORDER — LIDOCAINE HCL (PF) 1 % IJ SOLN
4.0000 mL | Freq: Once | INTRAMUSCULAR | Status: AC
  Administered 2021-02-08: 4 mL

## 2021-02-08 MED ORDER — METHYLPREDNISOLONE ACETATE 40 MG/ML IJ SUSP
40.0000 mg | Freq: Once | INTRAMUSCULAR | Status: AC
  Administered 2021-02-08: 40 mg via INTRA_ARTICULAR

## 2021-02-08 NOTE — Patient Instructions (Addendum)
Thank you for coming to the office today.  X-ray today does not show the cause of your problem. There is some stool may still be backed up and constipated.  Take Dicyclomine for cramping as needed w/ meals.  It may be constipation / partial bowel blockage  If not improving or worsening - next step is Emergency Department.  For Constipation (less frequent bowel movement that can be hard dry or involve straining).  Recommend trying OTC Miralax 17g = 1 capful in large glass water once daily for now, try several days to see if working, goal is soft stool or BM 1-2 times daily, if too loose then reduce dose or try every other day. If not effective may need to increase it to 2 doses at once in AM or may do 1 in morning and 1 in afternoon/evening  - This medicine is very safe and can be used often without any problem and will not make you dehydrated. It is good for use on AS NEEDED BASIS or even MAINTENANCE therapy for longer term for several days to weeks at a time to help regulate bowel movements  Other more natural remedies or preventative treatment: - Increase hydration with water - Increase fiber in diet (high fiber foods = vegetables, leafy greens, oats/grains) - May take OTC Fiber supplement (metamucil powder or pill/gummy) - May try OTC Probiotic  You received a Right Shoulder Joint steroid injection today. - Lidocaine numbing medicine may ease the pain initially for a few hours until it wears off - As discussed, you may experience a "steroid flare" this evening or within 24-48 hours, anytime medicine is injected into an inflamed joint it can cause the pain to get worse temporarily - Everyone responds differently to these injections, it depends on the patient and the severity of the joint problem, it may provide anywhere from days to weeks, to months of relief. Ideal response is >6 months relief - Try to take it easy for next 1-2 days, avoid over activity and strain on joint (limit lifting  for shoulder) - Recommend the following:   - For swelling - rest, compression sleeve / ACE wrap, elevation, and ice packs as needed for first few days   - For pain in future may use heating pad or moist heat as needed   Please schedule a Follow-up Appointment to: Return if symptoms worsen or fail to improve.  If you have any other questions or concerns, please feel free to call the office or send a message through [facility]. You may also schedule an earlier appointment if necessary.  Additionally, you may be receiving a survey about your experience at our office within a few days to 1 week by e-mail or mail. We value your feedback.  Nobie Putnam, DO [place]

## 2021-02-08 NOTE — Progress Notes (Signed)
Subjective:    Patient ID: Kristin Parker, female    DOB: [birth year], 70 y.o.   MRN: [id number]  Kristin Parker is a 70 y.o. female presenting on 02/08/2021 for Bloated, Abdominal Pain, and Nausea   HPI   Right Upper Abdominal Pain Reports symptoms started 1 month ago with worsening RUQ abdominal pain and bloating and feeling gas increased. She is having daily BM but smaller amount now and admits constipation. She quit smoking back in January 2022. Tried OTC Gas-X Admits nausea with some regurgitation, abdominal bloating - post prandial symptoms worse after she eats. Admits dyspnea, winded with walking. Some symptoms provoked based on position. Admits some stomach acid and GERD Last Colonoscopy 2012 Denies vomiting, dark stool, rectal bleeding, productive cough, fever chills, sweats  Chronic R Shoulder pain Last injection in R shoulder 08/2020 Limited with range of motion above head Ready for repeat injection Wants to delay surgery  Past Surgical History:  Procedure Laterality Date  . BREAST BIOPSY Left 08/13/2018   affirm bx, x clip, path pending  . CATARACT EXTRACTION Left    2017  . COLONOSCOPY  2012   Dr Candace Cruise  . LEFT HEART CATH AND CORONARY ANGIOGRAPHY N/A 06/25/2017   Procedure: LEFT HEART CATH AND CORONARY ANGIOGRAPHY;  Surgeon: [name], MD;  Location: [facility];  Service: Cardiovascular;  Laterality: N/A;     Depression screen [facility] 2/9 09/08/2020 12/04/2019 07/07/2019  Decreased Interest 0 0 0  Down, Depressed, Hopeless 1 0 1  PHQ - 2 Score 1 0 1  Altered sleeping 2 - -  Tired, decreased energy 1 - -  Change in appetite 0 - -  Feeling bad or failure about yourself  0 - -  Trouble concentrating 0 - -  Moving slowly or fidgety/restless 0 - -  Suicidal thoughts 0 - -  PHQ-9 Score 4 - -  Difficult doing work/chores Somewhat difficult - -    Social History   Tobacco Use  . Smoking status: Former Smoker    Packs/day: 0.50    Years: 30.00     Pack years: 15.00    Types: Cigarettes    Quit date: 10/30/2020    Years since quitting: 0.2  . Smokeless tobacco: Current User  . Tobacco comment: not quite ready due to covid-19   Vaping Use  . Vaping Use: Former  Substance Use Topics  . Alcohol use: No    Alcohol/week: 0.0 standard drinks  . Drug use: No    Review of Systems Per HPI unless specifically indicated above     Objective:    BP (!) 150/64   Pulse 63   Ht 5\' 7"  (1.702 m)   Wt 168 lb 3.2 oz (76.3 kg)   BMI 26.34 kg/m   Wt Readings from Last 3 Encounters:  02/08/21 168 lb 3.2 oz (76.3 kg)  11/30/20 162 lb (73.5 kg)  11/28/20 161 lb (73 kg)    Physical Exam Vitals and nursing note reviewed.  Constitutional:      General: She is not in acute distress.    Appearance: She is well-developed. She is not diaphoretic.     Comments: Well-appearing, comfortable, cooperative  HENT:     Head: Normocephalic and atraumatic.  Eyes:     General:        Right eye: No discharge.        Left eye: No discharge.     Conjunctiva/sclera: Conjunctivae normal.  Neck:  Thyroid: No thyromegaly.  Cardiovascular:     Rate and Rhythm: Normal rate and regular rhythm.     Heart sounds: Normal heart sounds. No murmur heard.   Pulmonary:     Effort: Pulmonary effort is normal. No respiratory distress.     Breath sounds: Normal breath sounds. No wheezing or rales.  Abdominal:     General: Abdomen is protuberant. Bowel sounds are decreased. There is distension.     Tenderness: There is abdominal tenderness in the right upper quadrant and epigastric area. There is no guarding or rebound. Negative signs include Murphy's sign and McBurney's sign.     Hernia: No hernia is present.  Musculoskeletal:        General: Normal range of motion.     Cervical back: Normal range of motion and neck supple.  Lymphadenopathy:     Cervical: No cervical adenopathy.  Skin:    General: Skin is warm and dry.     Findings: No erythema or rash.   Neurological:     Mental Status: She is alert and oriented to person, place, and time.  Psychiatric:        Behavior: Behavior normal.     Comments: Well groomed, good eye contact, normal speech and thoughts    ________________________________________________________ PROCEDURE NOTE Date: 02/08/21 Right Shoulder subacromial injection Discussed benefits and risks (including pain, bleeding, infection, steroid flare). Verbal consent given by patient. Medication:  1 cc Depo-medrol 40mg  and 4 cc Lidocaine 1% without epi Time Out taken  Landmarks identified. Area cleansed with alcohol wipes. Using 21 gauge and 1, 1/2 inch needle, Right subacromial bursa space was injected (with above listed medication) via posterior approach cold spray used for superficial anesthetic. Sterile bandage placed. Patient tolerated procedure well without bleeding or paresthesias. No complications.   I have personally reviewed the radiology report from [facility] on 02/08/21.  CLINICAL DATA:  Nausea, constipation, abdominal distension  EXAM: ABDOMEN - 1 VIEW  COMPARISON:  None.  FINDINGS: Two supine frontal views of the abdomen and pelvis are obtained. Bowel gas pattern is unremarkable. No masses or abnormal calcifications. Prominent lower lumbar spondylosis. No acute bony abnormalities.  IMPRESSION: 1. Unremarkable bowel gas pattern.   Electronically Signed   By: Randa Ngo M.D.   On: 02/08/2021 16:25  Results for orders placed or performed in visit on 10/12/20  ECHOCARDIOGRAM COMPLETE  Result Value Ref Range   AR max vel 1.61 cm2   AV Peak grad 19.2 mmHg   Ao pk vel 2.19 m/s   S' Lateral 2.50 cm   Area-P 1/2 2.15 cm2   AV Area VTI 1.80 cm2   AV Mean grad 10.0 mmHg   Single Plane A4C EF 55.9 %   Single Plane A2C EF 50.4 %   Calc EF 54.0 %   AV Area mean vel 1.58 cm2      Assessment & Plan:   Problem List Items Addressed This Visit    Chronic right shoulder pain    Other Visit  Diagnoses    Distended abdomen    -  Primary   Relevant Orders   DG Abd 1 View (Completed)   US ABDOMEN LIMITED RUQ (LIVER/GB)   Constipation, unspecified constipation type       Relevant Orders   DG Abd 1 View (Completed)   RUQ abdominal pain       Relevant Orders   DG Abd 1 View (Completed)   US ABDOMEN LIMITED RUQ (LIVER/GB)   Abdominal cramping  Relevant Medications   dicyclomine (BENTYL) 10 MG capsule      #RUQ Abdominal Pain, postprandial Abdominal cramping Abdominal distention  Concerning presentation today with 1 month of worsening abdominal pain distention, it is impacting her breathing at times due to the distended abdomen. Admits constipation  No prior abdominal surgery. Cannot rule out gallbladder pathology.  Check [facility] X-ray today for bowel gas pattern Results reviewed w/ patient in office, shows normal bowel gas pattern, reviewed images, does show some stool burden.  Trial on Miralax 17-34g as advised per AVS for cleanout Trial on rx Dicyclomine PRN cramping  Given no conclusive etiology, offered RUQ Abdominal Ultrasound for next evaluation, or if severe worsening or concerns she may go to hospital ED or Urgent Care next for immediate evaluation and imaging.  --------------   Consistent withrecurrentchronic R-shoulderpain, gradual worsening Secondary underlying moderate glenohumeral and AC arthritis and likely some bursitis flare Concern for possible adhesive capsulitis given significant limited range of motion Cannot rule out rotator cuff tendinopathy - likely given degenerative symptoms - may need advanced imaging Known arthritis other joints X-ray results above  Plan: Right shoulder subacromial steroid injection performed today, see procedure note for details. #4 steroid injection now since 2019  1. ContinuerxMeloxicam NSAID 15mg oncedaily (with food) for1-2 weeks, then as needed 2.Recommended totake Tylenol Ex Str 1-2 q 6 hr  PRN 3. Relative rest but keep shoulder mobile, demonstrated ROM exercises, avoid heavy lifting 4. May try heating pad PRN 5. Follow-upas needed  Already referred to Orthopedic in past  - may benefit from MRI in future also consider glenohumeral injection as option - may need surgical intervention  Orders Placed This Encounter  Procedures  . DG Abd 1 View    Standing Status:   Future    Number of Occurrences:   1    Standing Expiration Date:   05/10/2021    Order Specific Question:   Reason for Exam (SYMPTOM  OR DIAGNOSIS REQUIRED)    Answer:   large distended abdomen RUQ/epigastric pain, postprandial pain, nausea, constipation    Order Specific Question:   Preferred imaging location?    Answer:   [facility]  . US ABDOMEN LIMITED RUQ (LIVER/GB)    Standing Status:   Future    Standing Expiration Date:   05/10/2021    Order Specific Question:   Reason for exam:    Answer:   1 month RUQ abdominal pain postprandial abdominal distention    Order Specific Question:   Preferred imaging location?    Answer:   [facility] ordered this encounter  Medications  . lidocaine (PF) (XYLOCAINE) 1 % injection 4 mL  . methylPREDNISolone acetate (DEPO-MEDROL) injection 40 mg  . dicyclomine (BENTYL) 10 MG capsule    Sig: Take 1 capsule (10 mg total) by mouth 3 (three) times daily before meals.    Dispense:  30 capsule    Refill:  0    Follow up plan: Return if symptoms worsen or fail to improve.   Nobie Putnam, [facility] 02/08/2021, 3:44 PM

## 2021-02-17 ENCOUNTER — Inpatient Hospital Stay
Admission: EM | Admit: 2021-02-17 | Discharge: 2021-02-19 | DRG: 419 | Disposition: A | Discharge status: Home / Self Care | Payer: Medicare HMO | Attending: Surgery | Admitting: Surgery

## 2021-02-17 ENCOUNTER — Emergency Department: Payer: Medicare HMO

## 2021-02-17 DIAGNOSIS — Z20822 Contact with and (suspected) exposure to covid-19: Secondary | ICD-10-CM | POA: Diagnosis present

## 2021-02-17 DIAGNOSIS — K810 Acute cholecystitis: Secondary | ICD-10-CM | POA: Diagnosis not present

## 2021-02-17 DIAGNOSIS — K8000 Calculus of gallbladder with acute cholecystitis without obstruction: Principal | ICD-10-CM | POA: Diagnosis present

## 2021-02-17 DIAGNOSIS — Z87890 Personal history of sex reassignment: Secondary | ICD-10-CM

## 2021-02-17 DIAGNOSIS — K8020 Calculus of gallbladder without cholecystitis without obstruction: Secondary | ICD-10-CM | POA: Diagnosis not present

## 2021-02-17 DIAGNOSIS — F419 Anxiety disorder, unspecified: Secondary | ICD-10-CM | POA: Diagnosis not present

## 2021-02-17 DIAGNOSIS — Z881 Allergy status to other antibiotic agents status: Secondary | ICD-10-CM | POA: Diagnosis not present

## 2021-02-17 DIAGNOSIS — Z7982 Long term (current) use of aspirin: Secondary | ICD-10-CM | POA: Diagnosis not present

## 2021-02-17 DIAGNOSIS — Z7951 Long term (current) use of inhaled steroids: Secondary | ICD-10-CM | POA: Diagnosis not present

## 2021-02-17 DIAGNOSIS — R1011 Right upper quadrant pain: Secondary | ICD-10-CM

## 2021-02-17 DIAGNOSIS — I080 Rheumatic disorders of both mitral and aortic valves: Secondary | ICD-10-CM | POA: Diagnosis present

## 2021-02-17 DIAGNOSIS — I2510 Atherosclerotic heart disease of native coronary artery without angina pectoris: Secondary | ICD-10-CM | POA: Diagnosis not present

## 2021-02-17 DIAGNOSIS — Z8249 Family history of ischemic heart disease and other diseases of the circulatory system: Secondary | ICD-10-CM

## 2021-02-17 DIAGNOSIS — I10 Essential (primary) hypertension: Secondary | ICD-10-CM | POA: Diagnosis present

## 2021-02-17 DIAGNOSIS — R0609 Other forms of dyspnea: Secondary | ICD-10-CM | POA: Diagnosis not present

## 2021-02-17 DIAGNOSIS — K8010 Calculus of gallbladder with chronic cholecystitis without obstruction: Secondary | ICD-10-CM | POA: Diagnosis not present

## 2021-02-17 DIAGNOSIS — Z886 Allergy status to analgesic agent status: Secondary | ICD-10-CM | POA: Diagnosis not present

## 2021-02-17 DIAGNOSIS — Z79899 Other long term (current) drug therapy: Secondary | ICD-10-CM | POA: Diagnosis not present

## 2021-02-17 DIAGNOSIS — J449 Chronic obstructive pulmonary disease, unspecified: Secondary | ICD-10-CM | POA: Diagnosis not present

## 2021-02-17 DIAGNOSIS — E785 Hyperlipidemia, unspecified: Secondary | ICD-10-CM | POA: Diagnosis not present

## 2021-02-17 DIAGNOSIS — E782 Mixed hyperlipidemia: Secondary | ICD-10-CM | POA: Diagnosis not present

## 2021-02-17 DIAGNOSIS — K819 Cholecystitis, unspecified: Secondary | ICD-10-CM | POA: Diagnosis not present

## 2021-02-17 DIAGNOSIS — K760 Fatty (change of) liver, not elsewhere classified: Secondary | ICD-10-CM | POA: Diagnosis not present

## 2021-02-17 LAB — URINALYSIS, COMPLETE (UACMP) WITH MICROSCOPIC
Bacteria, UA: NONE SEEN
Bilirubin Urine: NEGATIVE
Glucose, UA: NEGATIVE mg/dL
Ketones, ur: NEGATIVE mg/dL
Leukocytes,Ua: NEGATIVE
Nitrite: NEGATIVE
Protein, ur: NEGATIVE mg/dL
Specific Gravity, Urine: 1.009 (ref 1.005–1.030)
Squamous Epithelial / HPF: NONE SEEN (ref 0–5)
pH: 5 (ref 5.0–8.0)

## 2021-02-17 LAB — COMPREHENSIVE METABOLIC PANEL
ALT: 21 U/L (ref 0–44)
AST: 19 U/L (ref 15–41)
Albumin: 4.1 g/dL (ref 3.5–5.0)
Alkaline Phosphatase: 104 U/L (ref 38–126)
Anion gap: 9 (ref 5–15)
BUN: 20 mg/dL (ref 8–23)
CO2: 24 mmol/L (ref 22–32)
Calcium: 9.3 mg/dL (ref 8.9–10.3)
Chloride: 104 mmol/L (ref 98–111)
Creatinine, Ser: 0.9 mg/dL (ref 0.44–1.00)
GFR, Estimated: >60 mL/min (ref >60)
Glucose, Bld: 98 mg/dL (ref 70–99)
Potassium: 3.5 mmol/L (ref 3.5–5.1)
Sodium: 137 mmol/L (ref 135–145)
Total Bilirubin: 0.6 mg/dL (ref 0.3–1.2)
Total Protein: 7.2 g/dL (ref 6.5–8.1)

## 2021-02-17 LAB — CBC
HCT: 39.2 % (ref 36.0–46.0)
Hemoglobin: 13.3 g/dL (ref 12.0–15.0)
MCH: 30.5 pg (ref 26.0–34.0)
MCHC: 33.9 g/dL (ref 30.0–36.0)
MCV: 89.9 fL (ref 80.0–100.0)
Platelets: 258 10*3/uL (ref 150–400)
RBC: 4.36 MIL/uL (ref 3.87–5.11)
RDW: 12.7 % (ref 11.5–15.5)
WBC: 10.8 10*3/uL — ABNORMAL HIGH (ref 4.0–10.5)
nRBC: 0 % (ref 0.0–0.2)

## 2021-02-17 LAB — RESP PANEL BY RT-PCR (FLU A&B, COVID) ARPGX2
Influenza A by PCR: NEGATIVE
Influenza B by PCR: NEGATIVE
SARS Coronavirus 2 by RT PCR: NEGATIVE

## 2021-02-17 LAB — LIPASE, BLOOD: Lipase: 32 U/L (ref 11–51)

## 2021-02-17 MED ORDER — TIOTROPIUM BROMIDE MONOHYDRATE 18 MCG IN CAPS
18.0000 ug | ORAL_CAPSULE | Freq: Every day | RESPIRATORY_TRACT | Status: DC
  Administered 2021-02-18 – 2021-02-19 (×2): 18 ug via RESPIRATORY_TRACT
  Filled 2021-02-17: qty 5

## 2021-02-17 MED ORDER — ALBUTEROL SULFATE HFA 108 (90 BASE) MCG/ACT IN AERS
2.0000 | INHALATION_SPRAY | Freq: Four times a day (QID) | RESPIRATORY_TRACT | Status: DC | PRN
  Filled 2021-02-17: qty 6.7

## 2021-02-17 MED ORDER — ONDANSETRON HCL 4 MG/2ML IJ SOLN
4.0000 mg | Freq: Four times a day (QID) | INTRAMUSCULAR | Status: DC | PRN
  Filled 2021-02-17: qty 2

## 2021-02-17 MED ORDER — PANTOPRAZOLE SODIUM 40 MG IV SOLR
40.0000 mg | Freq: Every day | INTRAVENOUS | Status: DC
  Administered 2021-02-17 – 2021-02-18 (×2): 40 mg via INTRAVENOUS
  Filled 2021-02-17 (×2): qty 40

## 2021-02-17 MED ORDER — LACTATED RINGERS IV SOLN
125.0000 mL/h | INTRAVENOUS | Status: DC
  Administered 2021-02-17: 125 mL/h via INTRAVENOUS

## 2021-02-17 MED ORDER — FLUTICASONE PROPIONATE 50 MCG/ACT NA SUSP
2.0000 | Freq: Every day | NASAL | Status: DC
  Administered 2021-02-18 – 2021-02-19 (×2): 2 via NASAL
  Filled 2021-02-17: qty 16

## 2021-02-17 MED ORDER — MORPHINE SULFATE (PF) 4 MG/ML IV SOLN
4.0000 mg | Freq: Once | INTRAVENOUS | Status: AC
  Administered 2021-02-17: 4 mg via INTRAVENOUS
  Filled 2021-02-17: qty 1

## 2021-02-17 MED ORDER — HYDROXYZINE HCL 10 MG PO TABS
10.0000 mg | ORAL_TABLET | Freq: Every evening | ORAL | Status: DC | PRN
  Administered 2021-02-19: 10 mg via ORAL
  Filled 2021-02-17 (×3): qty 1

## 2021-02-17 MED ORDER — INDOCYANINE GREEN 25 MG IV SOLR
2.5000 mg | INTRAVENOUS | Status: AC
  Administered 2021-02-18: 2.5 mg via INTRAVENOUS
  Filled 2021-02-17: qty 1

## 2021-02-17 MED ORDER — SODIUM CHLORIDE 0.9 % IV BOLUS
1000.0000 mL | Freq: Once | INTRAVENOUS | Status: AC
  Administered 2021-02-17: 1000 mL via INTRAVENOUS

## 2021-02-17 MED ORDER — ONDANSETRON 4 MG PO TBDP: 4.0000 mg | ORAL_TABLET | Freq: Four times a day (QID) | ORAL | Status: DC | PRN

## 2021-02-17 MED ORDER — ISOSORBIDE MONONITRATE ER 30 MG PO TB24
60.0000 mg | ORAL_TABLET | Freq: Every day | ORAL | Status: DC
  Administered 2021-02-19: 60 mg via ORAL
  Filled 2021-02-17: qty 2

## 2021-02-17 MED ORDER — ONDANSETRON HCL 4 MG/2ML IJ SOLN
4.0000 mg | Freq: Once | INTRAMUSCULAR | Status: AC
  Administered 2021-02-17: 4 mg via INTRAVENOUS
  Filled 2021-02-17: qty 2

## 2021-02-17 MED ORDER — LOSARTAN POTASSIUM 50 MG PO TABS
100.0000 mg | ORAL_TABLET | Freq: Every day | ORAL | Status: DC
  Administered 2021-02-19: 100 mg via ORAL
  Filled 2021-02-17: qty 2

## 2021-02-17 MED ORDER — POLYETHYLENE GLYCOL 3350 17 G PO PACK: 17.0000 g | PACK | Freq: Every day | ORAL | Status: DC | PRN

## 2021-02-17 MED ORDER — SERTRALINE HCL 50 MG PO TABS
100.0000 mg | ORAL_TABLET | Freq: Every day | ORAL | Status: DC
  Administered 2021-02-19: 100 mg via ORAL
  Filled 2021-02-17: qty 2

## 2021-02-17 MED ORDER — METRONIDAZOLE IN NACL 5-0.79 MG/ML-% IV SOLN
500.0000 mg | Freq: Once | INTRAVENOUS | Status: DC
  Filled 2021-02-17: qty 100

## 2021-02-17 MED ORDER — SODIUM CHLORIDE 0.9 % IV SOLN: 2.0000 g | Freq: Once | INTRAVENOUS | Status: DC

## 2021-02-17 MED ORDER — AMLODIPINE BESYLATE 10 MG PO TABS
10.0000 mg | ORAL_TABLET | Freq: Every day | ORAL | Status: DC
  Administered 2021-02-19: 10 mg via ORAL
  Filled 2021-02-17: qty 1

## 2021-02-17 MED ORDER — PIPERACILLIN-TAZOBACTAM 3.375 G IVPB
3.3750 g | Freq: Three times a day (TID) | INTRAVENOUS | Status: DC
  Administered 2021-02-17 – 2021-02-18 (×4): 3.375 g via INTRAVENOUS
  Filled 2021-02-17 (×4): qty 50

## 2021-02-17 MED ORDER — ACETAMINOPHEN 500 MG PO TABS
1000.0000 mg | ORAL_TABLET | Freq: Four times a day (QID) | ORAL | Status: DC
  Administered 2021-02-18: 1000 mg via ORAL
  Filled 2021-02-17 (×2): qty 2

## 2021-02-17 MED ORDER — HYDROMORPHONE HCL 1 MG/ML IJ SOLN
0.5000 mg | INTRAMUSCULAR | Status: DC | PRN
  Administered 2021-02-18: 0.5 mg via INTRAVENOUS
  Filled 2021-02-17: qty 0.5

## 2021-02-17 NOTE — ED Notes (Signed)
Request made for transport to the floor ?

## 2021-02-17 NOTE — ED Triage Notes (Addendum)
C/o R abd burning 'contractions.', denies vomiting/diarrhea. Reports went to pmd 1wk ago and sched ultrasound 5/4

## 2021-02-17 NOTE — ED Provider Notes (Signed)
[facility] Emergency Department Provider Note   ____________________________________________   Event Date/Time   First MD Initiated Contact with Patient 02/17/21 1928     (approximate)  I have reviewed the triage vital signs and the nursing notes.   HISTORY  Chief Complaint Abdominal Pain    HPI Kristin Parker is a 70 y.o. female presents to the ED with complaint of right upper quadrant pain and feeling of "contractions".  Patient states that she saw her PCP 1 week ago for same symptoms.  She is scheduled for an ultrasound on May 4.  Today she had a ham and cheese sandwich and several hours after eating this she began having symptoms again.  She endorses burping, bloating and reflux symptoms.  She denies any nausea, vomiting or diarrhea.  Last bowel movement was today.  No urinary symptoms.  Looking through patient's chart it appears that she was started on Bentyl 10 mg 3 times daily on 02/08/2021.  She states that this is not helped with her symptoms.  Currently she rates her pain as 7 out of 10.    Patient drove herself to the emergency department.     Past Medical History:  Diagnosis Date  . Anxiety   . Aortic valve sclerosis   . Bilateral carotid bruits   . Carotid artery stenosis   . Hypertension   . Mitral regurgitation   . Mixed hyperlipidemia   . Shoulder pain   . Sleeping difficulties     Patient Active Problem List   Diagnosis Date Noted  . Nonrheumatic mitral valve regurgitation 09/15/2020  . Statin intolerance 09/15/2020  . Epidermal inclusion cyst 09/13/2020  . Drug-induced myopathy 09/12/2020  . Fatigue 07/23/2019  . Coronary artery disease of native artery of native heart with stable angina pectoris ([place]) 02/26/2019  . Osteoarthritis of glenohumeral joint, right 11/26/2018  . Elevated hemoglobin A1c 06/20/2018  . Bilateral carpal tunnel syndrome 05/30/2018  . Chronic right shoulder pain 01/01/2018  . Chronic obstructive  pulmonary disease ([place]) 07/10/2017  . Abnormal stress test 06/24/2017  . CAD (coronary artery disease) 06/24/2017  . Bilateral carotid artery stenosis 06/12/2017  . Atypical chest pain 06/12/2017  . Allergic dermatitis 03/05/2017  . Murmur 01/26/2017  . DOE (dyspnea on exertion) 01/26/2017  . Tobacco abuse 08/14/2016  . Left knee pain 06/12/2016  . Skin mass 06/12/2016  . Urine frequency 12/22/2015  . Essential hypertension 08/18/2015  . Hyperlipidemia 08/18/2015  . Chronic anxiety 08/18/2015  . Seasonal allergies 08/18/2015  . Carotid bruit present 08/18/2015    Past Surgical History:  Procedure Laterality Date  . BREAST BIOPSY Left 08/13/2018   affirm bx, x clip, path pending  . CATARACT EXTRACTION Left    2017  . COLONOSCOPY  2012   Dr Candace Cruise  . LEFT HEART CATH AND CORONARY ANGIOGRAPHY N/A 06/25/2017   Procedure: LEFT HEART CATH AND CORONARY ANGIOGRAPHY;  Surgeon: [name], MD;  Location: [facility];  Service: Cardiovascular;  Laterality: N/A;    Prior to Admission medications   Medication Sig Start Date End Date Taking? Authorizing Provider  albuterol (VENTOLIN HFA) 108 (90 Base) MCG/ACT inhaler INHALE 2 PUFFS EVERY 4 HOURS FOR WHEEZING OR SHORTNESS OF BREATH AS NEEDED 12/10/20   Parks Ranger, Devonne Doughty, DO  amLODipine (NORVASC) 10 MG tablet Take 1 tablet (10 mg total) by mouth daily. 09/08/20   Karamalegos, Devonne Doughty, DO  aspirin EC 81 MG tablet Take 1 tablet (81 mg total) by mouth daily.  03/12/17   End, Harrell Gave, MD  dicyclomine (BENTYL) 10 MG capsule Take 1 capsule (10 mg total) by mouth 3 (three) times daily before meals. 02/08/21   Karamalegos, Devonne Doughty, DO  ezetimibe (ZETIA) 10 MG tablet TAKE 1 TABLET BY MOUTH ONCE DAILY 01/30/21   End, Harrell Gave, MD  fluticasone [facility]) 50 MCG/ACT nasal spray USE 2 SPRAYS INTO EACH NOSTRIL ONCE DAILY 10/31/20   Parks Ranger, Devonne Doughty, DO  hydrOXYzine (ATARAX/VISTARIL) 10 MG tablet TAKE 1 TABLET BY MOUTH AT  BEDTIME AS NEEDED ANXIETY/SLEEP 01/28/21   Karamalegos, Devonne Doughty, DO  isosorbide mononitrate (IMDUR) 60 MG 24 hr tablet TAKE 1 TABLET BY MOUTH ONCE DAILY 01/30/21   End, Harrell Gave, MD  losartan (COZAAR) 100 MG tablet Take 1 tablet (100 mg total) by mouth daily. 09/08/20   Karamalegos, Devonne Doughty, DO  meloxicam (MOBIC) 15 MG tablet TAKE 1 TABLET BY MOUTH ONCE DAILY AS NEEDED FOR PAIN WITH FOOD 09/26/20   Parks Ranger, Devonne Doughty, DO  Omega-3 Fatty Acids (FISH OIL) 1000 MG CAPS Take 1 capsule (1,000 mg total) by mouth 2 (two) times daily. 05/30/17   End, Harrell Gave, MD  rosuvastatin (CRESTOR) 5 MG tablet Take 1 tablet (5 mg total) by mouth daily. 11/30/20 02/28/21  Pixie Casino, MD  sertraline (ZOLOFT) 100 MG tablet Take 1 tablet (100 mg total) by mouth daily. 09/08/20   Karamalegos, Devonne Doughty, DO  SPIRIVA HANDIHALER 18 MCG inhalation capsule INHALE CONTENTS OF 1 CAPSULE ONCE DAILY 06/11/20   Olin Hauser, DO    Allergies Bactrim [sulfamethoxazole-trimethoprim] and Aleve [naproxen sodium]  Family History  Problem Relation Age of Onset  . Cancer Mother        bladder cancer  . Heart disease Mother 78       Pacemaker  . Cancer Father        lung  . Multiple sclerosis Sister   . Valvular heart disease Brother 90       s/p bioprosthetic valve replacement at [facility]  . Breast cancer Neg Hx     Social History Social History   Tobacco Use  . Smoking status: Former Smoker    Packs/day: 0.50    Years: 30.00    Pack years: 15.00    Types: Cigarettes    Quit date: 10/30/2020    Years since quitting: 0.3  . Smokeless tobacco: Current User  . Tobacco comment: not quite ready due to covid-19   Vaping Use  . Vaping Use: Former  Substance Use Topics  . Alcohol use: No    Alcohol/week: 0.0 standard drinks  . Drug use: No    Review of Systems Constitutional: No fever/chills Eyes: No visual changes. ENT: No sore throat. Cardiovascular: Denies chest pain. Respiratory: Denies  shortness of breath.  Negative for cough. Gastrointestinal: Positive abdominal pain.  No nausea, no vomiting.  No diarrhea.  No constipation. Genitourinary: Negative for dysuria. Musculoskeletal: Negative for muscle skeletal pain. Skin: Negative for rash. Neurological: Negative for headaches, focal weakness or numbness.  ____________________________________________   PHYSICAL EXAM:  VITAL SIGNS: ED Triage Vitals  Enc Vitals Group     BP 02/17/21 1922 (!) 159/84     Pulse Rate 02/17/21 1917 68     Resp 02/17/21 1917 16     Temp 02/17/21 1917 98 F (36.7 C)     Temp Source 02/17/21 1917 Oral     SpO2 02/17/21 1917 95 %     Weight 02/17/21 1918 168 lb 3.2 oz (76.3 kg)  Height 02/17/21 1918 5\' 7"  (1.702 m)     Head Circumference --      Peak Flow --      Pain Score 02/17/21 1918 7     Pain Loc --      Pain Edu? --      Excl. in [place]? --    Constitutional: Alert and oriented. Well appearing and in no acute distress. Eyes: Conjunctivae are normal.  Head: Atraumatic. Nose: No congestion/rhinnorhea. Neck: No stridor.   Cardiovascular: Normal rate, regular rhythm. Grossly normal heart sounds.  Good peripheral circulation. Respiratory: Normal respiratory effort.  No retractions. Lungs CTAB. Gastrointestinal: Distended.  Moderate tenderness is noted in the right upper quadrant and diffusely in the mid epigastric area.  Bowel sounds are diffusely heard and are active. Musculoskeletal: Moves upper and lower extremities with any difficulty.  No edema is noted in lower extremities. Neurologic:  Normal speech and language. No gross focal neurologic deficits are appreciated.  Skin:  Skin is warm, dry and intact. No rash noted. Psychiatric: Mood and affect are normal. Speech and behavior are normal.  ____________________________________________   LABS (all labs ordered are listed, but only abnormal results are displayed)  Labs Reviewed  CBC - Abnormal; Notable for the following  components:      Result Value   WBC 10.8 (*)    All other components within normal limits  LIPASE, BLOOD  COMPREHENSIVE METABOLIC PANEL  URINALYSIS, COMPLETE (UACMP) WITH MICROSCOPIC     INITIAL IMPRESSION / ASSESSMENT AND PLAN / ED COURSE  As part of my medical decision making, I reviewed the following data within the electronic MEDICAL RECORD NUMBER Notes from prior ED visits and [place] Controlled Substance Database  ----------------------------------------- 8:29 PM on 02/17/2021 -----------------------------------------  Patient care is being transferred to Dr. Ellender Hose as ultrasound has not resulted or performed on this patient.  Patient is here with right upper quadrant pain with suspicion for cholecystitis.  Patient has had symptoms of right upper quadrant pain for 1 week and has seen her primary care provider.  Today she had increased symptoms after eating a ham and cheese sandwich.  Patient currently has nausea without vomiting, afebrile. ____________________________________________   FINAL CLINICAL IMPRESSION(S) / ED DIAGNOSES  Final diagnoses:  RUQ pain     ED Discharge Orders    None      *Please note:  Kristin Parker was evaluated in Emergency Department on 02/17/2021 for the symptoms described in the history of present illness. She was evaluated in the context of the global COVID-19 pandemic, which necessitated consideration that the patient might be at risk for infection with the SARS-CoV-2 virus that causes COVID-19. Institutional protocols and algorithms that pertain to the evaluation of patients at risk for COVID-19 are in a state of rapid change based on information released by regulatory bodies including the CDC and federal and state organizations. These policies and algorithms were followed during the patient's care in the ED.  Some ED evaluations and interventions may be delayed as a result of limited staffing during and the pandemic.*   Note:  This document was  prepared using Dragon voice recognition software and may include unintentional dictation errors.    Johnn Hai, PA-C 02/18/21 Justin Mend    Duffy Bruce, MD 02/19/21 [phone]

## 2021-02-17 NOTE — ED Notes (Signed)
Ex blue/gray to lab

## 2021-02-17 NOTE — ED Notes (Signed)
Oxygen level dropped to 88% after morphine, 2L [place] placed

## 2021-02-18 ENCOUNTER — Encounter: Payer: Self-pay | Admitting: Surgery

## 2021-02-18 ENCOUNTER — Other Ambulatory Visit: Payer: Self-pay

## 2021-02-18 ENCOUNTER — Inpatient Hospital Stay: Payer: Medicare HMO | Admitting: Anesthesiology

## 2021-02-18 ENCOUNTER — Encounter
Admission: EM | Disposition: A | Discharge status: Home / Self Care | Payer: Self-pay | Source: Home / Self Care | Attending: Surgery

## 2021-02-18 DIAGNOSIS — K8010 Calculus of gallbladder with chronic cholecystitis without obstruction: Secondary | ICD-10-CM

## 2021-02-18 DIAGNOSIS — K810 Acute cholecystitis: Secondary | ICD-10-CM

## 2021-02-18 LAB — CBC
HCT: 38.4 % (ref 36.0–46.0)
Hemoglobin: 13.1 g/dL (ref 12.0–15.0)
MCH: 30.9 pg (ref 26.0–34.0)
MCHC: 34.1 g/dL (ref 30.0–36.0)
MCV: 90.6 fL (ref 80.0–100.0)
Platelets: 224 10*3/uL (ref 150–400)
RBC: 4.24 MIL/uL (ref 3.87–5.11)
RDW: 12.8 % (ref 11.5–15.5)
WBC: 8 10*3/uL (ref 4.0–10.5)
nRBC: 0 % (ref 0.0–0.2)

## 2021-02-18 LAB — COMPREHENSIVE METABOLIC PANEL
ALT: 32 U/L (ref 0–44)
AST: 40 U/L (ref 15–41)
Albumin: 3.4 g/dL — ABNORMAL LOW (ref 3.5–5.0)
Alkaline Phosphatase: 99 U/L (ref 38–126)
Anion gap: 7 (ref 5–15)
BUN: 16 mg/dL (ref 8–23)
CO2: 25 mmol/L (ref 22–32)
Calcium: 8.6 mg/dL — ABNORMAL LOW (ref 8.9–10.3)
Chloride: 107 mmol/L (ref 98–111)
Creatinine, Ser: 0.86 mg/dL (ref 0.44–1.00)
GFR, Estimated: >60 mL/min (ref >60)
Glucose, Bld: 97 mg/dL (ref 70–99)
Potassium: 3.8 mmol/L (ref 3.5–5.1)
Sodium: 139 mmol/L (ref 135–145)
Total Bilirubin: 0.7 mg/dL (ref 0.3–1.2)
Total Protein: 6.3 g/dL — ABNORMAL LOW (ref 6.5–8.1)

## 2021-02-18 LAB — MAGNESIUM: Magnesium: 1.8 mg/dL (ref 1.7–2.4)

## 2021-02-18 SURGERY — CHOLECYSTECTOMY, ROBOT-ASSISTED, LAPAROSCOPIC
Anesthesia: General | Site: Abdomen

## 2021-02-18 MED ORDER — BUPIVACAINE-EPINEPHRINE (PF) 0.25% -1:200000 IJ SOLN
INTRAMUSCULAR | Status: AC
  Filled 2021-02-18: qty 30

## 2021-02-18 MED ORDER — OXYCODONE HCL 5 MG PO TABS
5.0000 mg | ORAL_TABLET | ORAL | Status: DC | PRN
  Administered 2021-02-19: 5 mg via ORAL
  Filled 2021-02-18: qty 1

## 2021-02-18 MED ORDER — PROPOFOL 10 MG/ML IV BOLUS
INTRAVENOUS | Status: AC
  Filled 2021-02-18: qty 60

## 2021-02-18 MED ORDER — SUGAMMADEX SODIUM 200 MG/2ML IV SOLN
INTRAVENOUS | Status: DC | PRN
  Administered 2021-02-18: 200 mg via INTRAVENOUS

## 2021-02-18 MED ORDER — FENTANYL CITRATE (PF) 250 MCG/5ML IJ SOLN
INTRAMUSCULAR | Status: AC
  Filled 2021-02-18: qty 5

## 2021-02-18 MED ORDER — DEXAMETHASONE SODIUM PHOSPHATE 10 MG/ML IJ SOLN
INTRAMUSCULAR | Status: AC
  Filled 2021-02-18: qty 1

## 2021-02-18 MED ORDER — ONDANSETRON HCL 4 MG/2ML IJ SOLN
INTRAMUSCULAR | Status: AC
  Filled 2021-02-18: qty 2

## 2021-02-18 MED ORDER — FENTANYL CITRATE (PF) 100 MCG/2ML IJ SOLN
INTRAMUSCULAR | Status: DC | PRN
  Administered 2021-02-18 (×3): 50 ug via INTRAVENOUS

## 2021-02-18 MED ORDER — EPHEDRINE SULFATE 50 MG/ML IJ SOLN
INTRAMUSCULAR | Status: DC | PRN
  Administered 2021-02-18 (×2): 5 mg via INTRAVENOUS

## 2021-02-18 MED ORDER — MIDAZOLAM HCL 2 MG/2ML IJ SOLN
INTRAMUSCULAR | Status: AC
  Filled 2021-02-18: qty 2

## 2021-02-18 MED ORDER — ONDANSETRON HCL 4 MG/2ML IJ SOLN
INTRAMUSCULAR | Status: DC | PRN
  Administered 2021-02-18: 4 mg via INTRAVENOUS

## 2021-02-18 MED ORDER — DEXAMETHASONE SODIUM PHOSPHATE 10 MG/ML IJ SOLN
INTRAMUSCULAR | Status: DC | PRN
  Administered 2021-02-18: 10 mg via INTRAVENOUS

## 2021-02-18 MED ORDER — ACETAMINOPHEN 10 MG/ML IV SOLN
INTRAVENOUS | Status: DC | PRN
  Administered 2021-02-18: 1000 mg via INTRAVENOUS

## 2021-02-18 MED ORDER — ONDANSETRON HCL 4 MG/2ML IJ SOLN
4.0000 mg | Freq: Once | INTRAMUSCULAR | Status: AC | PRN
  Administered 2021-02-18: 4 mg via INTRAVENOUS

## 2021-02-18 MED ORDER — MIDAZOLAM HCL 2 MG/2ML IJ SOLN
INTRAMUSCULAR | Status: DC | PRN
  Administered 2021-02-18: 2 mg via INTRAVENOUS

## 2021-02-18 MED ORDER — BUPIVACAINE-EPINEPHRINE (PF) 0.25% -1:200000 IJ SOLN
INTRAMUSCULAR | Status: DC | PRN
  Administered 2021-02-18: 30 mL

## 2021-02-18 MED ORDER — PROPOFOL 10 MG/ML IV BOLUS
INTRAVENOUS | Status: DC | PRN
  Administered 2021-02-18: 120 mg via INTRAVENOUS

## 2021-02-18 MED ORDER — LIDOCAINE HCL (CARDIAC) PF 100 MG/5ML IV SOSY
PREFILLED_SYRINGE | INTRAVENOUS | Status: DC | PRN
  Administered 2021-02-18: 80 mg via INTRAVENOUS

## 2021-02-18 MED ORDER — ACETAMINOPHEN 10 MG/ML IV SOLN
INTRAVENOUS | Status: AC
  Filled 2021-02-18: qty 100

## 2021-02-18 MED ORDER — ROCURONIUM BROMIDE 10 MG/ML (PF) SYRINGE
PREFILLED_SYRINGE | INTRAVENOUS | Status: AC
  Filled 2021-02-18: qty 10

## 2021-02-18 MED ORDER — LACTATED RINGERS IV SOLN: INTRAVENOUS | Status: DC

## 2021-02-18 MED ORDER — GLYCOPYRROLATE 0.2 MG/ML IJ SOLN
INTRAMUSCULAR | Status: DC | PRN
  Administered 2021-02-18: .2 mg via INTRAVENOUS

## 2021-02-18 MED ORDER — LIDOCAINE HCL (PF) 2 % IJ SOLN
INTRAMUSCULAR | Status: AC
  Filled 2021-02-18: qty 5

## 2021-02-18 MED ORDER — ROCURONIUM BROMIDE 100 MG/10ML IV SOLN
INTRAVENOUS | Status: DC | PRN
  Administered 2021-02-18: 60 mg via INTRAVENOUS
  Administered 2021-02-18: 10 mg via INTRAVENOUS

## 2021-02-18 MED ORDER — GLYCOPYRROLATE 0.2 MG/ML IJ SOLN
INTRAMUSCULAR | Status: AC
  Filled 2021-02-18: qty 1

## 2021-02-18 MED ORDER — FENTANYL CITRATE (PF) 100 MCG/2ML IJ SOLN: 25.0000 ug | INTRAMUSCULAR | Status: DC | PRN

## 2021-02-18 SURGICAL SUPPLY — 54 items
BAG INFUSER PRESSURE 100CC (MISCELLANEOUS) ×3 IMPLANT
CANISTER SUCT 1200ML W/VALVE (MISCELLANEOUS) ×3 IMPLANT
CANNULA REDUC XI 12-8 STAPL (CANNULA) ×1
CANNULA REDUCER 12-8 DVNC XI (CANNULA) ×2 IMPLANT
CHLORAPREP W/TINT 26 (MISCELLANEOUS) ×3 IMPLANT
CLIP VESOLOCK MED LG 6/CT (CLIP) ×3 IMPLANT
COVER LIGHT HANDLE STERIS (MISCELLANEOUS) ×3 IMPLANT
COVER WAND RF STERILE (DRAPES) ×3 IMPLANT
CUP MEDICINE 2OZ PLAST GRAD ST (MISCELLANEOUS) ×3 IMPLANT
DECANTER SPIKE VIAL GLASS SM (MISCELLANEOUS) ×3 IMPLANT
DEFOGGER SCOPE WARMER CLEARIFY (MISCELLANEOUS) ×3 IMPLANT
DERMABOND ADVANCED (GAUZE/BANDAGES/DRESSINGS) ×1
DERMABOND ADVANCED .7 DNX12 (GAUZE/BANDAGES/DRESSINGS) ×2 IMPLANT
DRAPE ARM DVNC X/XI (DISPOSABLE) ×8 IMPLANT
DRAPE COLUMN DVNC XI (DISPOSABLE) ×2 IMPLANT
DRAPE DA VINCI XI ARM (DISPOSABLE) ×4
DRAPE DA VINCI XI COLUMN (DISPOSABLE) ×1
ELECT CAUTERY BLADE TIP 2.5 (TIP) ×3
ELECT REM PT RETURN 9FT ADLT (ELECTROSURGICAL) ×3
ELECTRODE CAUTERY BLDE TIP 2.5 (TIP) ×2 IMPLANT
ELECTRODE REM PT RTRN 9FT ADLT (ELECTROSURGICAL) ×2 IMPLANT
GLOVE SURG SYN 7.0 (GLOVE) ×6 IMPLANT
GLOVE SURG SYN 7.5  E (GLOVE) ×2
GLOVE SURG SYN 7.5 E (GLOVE) ×4 IMPLANT
GOWN STRL REUS W/ TWL LRG LVL3 (GOWN DISPOSABLE) ×8 IMPLANT
GOWN STRL REUS W/TWL LRG LVL3 (GOWN DISPOSABLE) ×4
IRRIGATOR SUCT 8 DISP DVNC XI (IRRIGATION / IRRIGATOR) ×2 IMPLANT
IRRIGATOR SUCTION 8MM XI DISP (IRRIGATION / IRRIGATOR) ×1
IV NS 1000ML (IV SOLUTION)
IV NS 1000ML BAXH (IV SOLUTION) IMPLANT
KIT PINK PAD W/HEAD ARE REST (MISCELLANEOUS) ×3
KIT PINK PAD W/HEAD ARM REST (MISCELLANEOUS) ×2 IMPLANT
LABEL OR SOLS (LABEL) ×3 IMPLANT
MANIFOLD NEPTUNE II (INSTRUMENTS) ×3 IMPLANT
NEEDLE HYPO 22GX1.5 SAFETY (NEEDLE) ×3 IMPLANT
NS IRRIG 500ML POUR BTL (IV SOLUTION) ×3 IMPLANT
OBTURATOR OPTICAL STANDARD 8MM (TROCAR) ×1
OBTURATOR OPTICAL STND 8 DVNC (TROCAR) ×2
OBTURATOR OPTICALSTD 8 DVNC (TROCAR) ×2 IMPLANT
PACK LAP CHOLECYSTECTOMY (MISCELLANEOUS) ×3 IMPLANT
PENCIL ELECTRO HAND CTR (MISCELLANEOUS) ×3 IMPLANT
POUCH SPECIMEN RETRIEVAL 10MM (ENDOMECHANICALS) ×3 IMPLANT
SEAL CANN UNIV 5-8 DVNC XI (MISCELLANEOUS) ×8 IMPLANT
SEAL XI 5MM-8MM UNIVERSAL (MISCELLANEOUS) ×4
SET TUBE SMOKE EVAC HIGH FLOW (TUBING) ×3 IMPLANT
SOLUTION ELECTROLUBE (MISCELLANEOUS) ×3 IMPLANT
SPONGE LAP 18X18 RF (DISPOSABLE) IMPLANT
SPONGE LAP 4X18 RFD (DISPOSABLE) ×3 IMPLANT
STAPLER CANNULA SEAL DVNC XI (STAPLE) ×2 IMPLANT
STAPLER CANNULA SEAL XI (STAPLE) ×1
SUT MNCRL AB 4-0 PS2 18 (SUTURE) ×3 IMPLANT
SUT VICRYL 0 AB UR-6 (SUTURE) ×6 IMPLANT
TAPE TRANSPORE STRL 2 31045 (GAUZE/BANDAGES/DRESSINGS) ×3 IMPLANT
TROCAR BALLN GELPORT 12X130M (ENDOMECHANICALS) ×3 IMPLANT

## 2021-02-18 NOTE — Anesthesia Postprocedure Evaluation (Signed)
Anesthesia Post Note  Patient: Kristin Parker  Procedure(s) Performed: XI ROBOTIC ASSISTED LAPAROSCOPIC CHOLECYSTECTOMY (N/A Abdomen) INDOCYANINE GREEN FLUORESCENCE IMAGING (ICG) (N/A )  Patient location during evaluation: PACU Anesthesia Type: General Level of consciousness: awake and alert and oriented Pain management: pain level controlled Vital Signs Assessment: post-procedure vital signs reviewed and stable Respiratory status: spontaneous breathing Cardiovascular status: blood pressure returned to baseline Anesthetic complications: no   No complications documented.   Last Vitals:  Vitals:   02/18/21 1330 02/18/21 1626  BP: 130/60 139/60  Pulse: 75 70  Resp: 20 18  Temp: 36.8 C 36.7 C  SpO2: 93% 92%    Last Pain:  Vitals:   02/18/21 1626  TempSrc: Oral  PainSc:                  Mirza Kidney

## 2021-02-18 NOTE — H&P (Signed)
Date of Admission:  02/18/2021  Reason for Admission:  Acute cholecystitis  History of Present Illness: Kristin Parker is a 70 y.o. female presenting with an on/off history of RUQ abdominal pain for a few weeks.  She denies any associated nausea or vomiting, but the pain is the main symptom.  She describes it as contractions.  She saw her PCP on 4/13 and an U/S was scheduled for next month.  However, her pain worsened yesterday and she presented to the ED.  Her workup showed a WBC of 10.8, normal LFTs, and U/S showed a contracted gallbladder with cholelithiasis.  Her pain would not improve, so she was admitted to the surgical team overnight.  Denies any fevers, chills, chest pain, shortness of breath.  She's having bowel function but reports feeling distended.  Past Medical History: Past Medical History:  Diagnosis Date  . Anxiety   . Aortic valve sclerosis   . Bilateral carotid bruits   . Carotid artery stenosis   . Hypertension   . Mitral regurgitation   . Mixed hyperlipidemia   . Shoulder pain   . Sleeping difficulties      Past Surgical History: Past Surgical History:  Procedure Laterality Date  . BREAST BIOPSY Left 08/13/2018   affirm bx, x clip, path pending  . CATARACT EXTRACTION Left    2017  . COLONOSCOPY  2012   Dr Candace Cruise  . LEFT HEART CATH AND CORONARY ANGIOGRAPHY N/A 06/25/2017   Procedure: LEFT HEART CATH AND CORONARY ANGIOGRAPHY;  Surgeon: [name], MD;  Location: [facility];  Service: Cardiovascular;  Laterality: N/A;    Home Medications: Prior to Admission medications   Medication Sig Start Date End Date Taking? Authorizing Provider  albuterol (VENTOLIN HFA) 108 (90 Base) MCG/ACT inhaler INHALE 2 PUFFS EVERY 4 HOURS FOR WHEEZING OR SHORTNESS OF BREATH AS NEEDED 12/10/20  Yes Karamalegos, Devonne Doughty, DO  amLODipine (NORVASC) 10 MG tablet Take 1 tablet (10 mg total) by mouth daily. 09/08/20  Yes Karamalegos, Devonne Doughty, DO  aspirin EC 81 MG  tablet Take 1 tablet (81 mg total) by mouth daily. 03/12/17  Yes End, Harrell Gave, MD  dicyclomine (BENTYL) 10 MG capsule Take 1 capsule (10 mg total) by mouth 3 (three) times daily before meals. 02/08/21  Yes Karamalegos, Devonne Doughty, DO  ezetimibe (ZETIA) 10 MG tablet TAKE 1 TABLET BY MOUTH ONCE DAILY 01/30/21  Yes End, Harrell Gave, MD  fluticasone [facility]) 50 MCG/ACT nasal spray USE 2 SPRAYS INTO EACH NOSTRIL ONCE DAILY 10/31/20  Yes Karamalegos, Devonne Doughty, DO  hydrOXYzine (ATARAX/VISTARIL) 10 MG tablet TAKE 1 TABLET BY MOUTH AT BEDTIME AS NEEDED ANXIETY/SLEEP 01/28/21  Yes Karamalegos, Devonne Doughty, DO  isosorbide mononitrate (IMDUR) 60 MG 24 hr tablet TAKE 1 TABLET BY MOUTH ONCE DAILY 01/30/21  Yes End, Harrell Gave, MD  losartan (COZAAR) 100 MG tablet Take 1 tablet (100 mg total) by mouth daily. 09/08/20  Yes Karamalegos, Devonne Doughty, DO  meloxicam (MOBIC) 15 MG tablet TAKE 1 TABLET BY MOUTH ONCE DAILY AS NEEDED FOR PAIN WITH FOOD 09/26/20  Yes Karamalegos, Devonne Doughty, DO  Omega-3 Fatty Acids (FISH OIL) 1000 MG CAPS Take 1 capsule (1,000 mg total) by mouth 2 (two) times daily. 05/30/17  Yes End, Harrell Gave, MD  sertraline (ZOLOFT) 100 MG tablet Take 1 tablet (100 mg total) by mouth daily. 09/08/20  Yes Karamalegos, Devonne Doughty, DO  SPIRIVA HANDIHALER 18 MCG inhalation capsule INHALE CONTENTS OF 1 CAPSULE ONCE DAILY 06/11/20  Yes Parks Ranger, Devonne Doughty,  DO  rosuvastatin (CRESTOR) 5 MG tablet Take 1 tablet (5 mg total) by mouth daily. Patient not taking: No sig reported 11/30/20 02/28/21  Pixie Casino, MD    Allergies: Allergies  Allergen Reactions  . Bactrim [Sulfamethoxazole-Trimethoprim] Nausea And Vomiting  . Aleve [Naproxen Sodium] Swelling    Lips. But patient can take Ibuprofen without problems    Social History:  reports that she quit smoking about 3 months ago. Her smoking use included cigarettes. She has a 15.00 pack-year smoking history. She uses smokeless tobacco. She reports that  she does not drink alcohol and does not use drugs.   Family History: Family History  Problem Relation Age of Onset  . Cancer Mother        bladder cancer  . Heart disease Mother 54       Pacemaker  . Cancer Father        lung  . Multiple sclerosis Sister   . Valvular heart disease Brother 21       s/p bioprosthetic valve replacement at [facility]  . Breast cancer Neg Hx     Review of Systems: Review of Systems  Constitutional: Negative for chills and fever.  HENT: Negative for hearing loss.   Respiratory: Negative for shortness of breath.   Cardiovascular: Negative for chest pain.  Gastrointestinal: Positive for abdominal pain. Negative for constipation, diarrhea, nausea and vomiting.  Genitourinary: Negative for dysuria.  Musculoskeletal: Negative for myalgias.  Skin: Negative for rash.  Neurological: Negative for dizziness.  Psychiatric/Behavioral: Negative for depression.    Physical Exam BP 140/67 (BP Location: Left Arm)   Pulse (!) 57   Temp 98 F (36.7 C) (Oral)   Resp 20   Ht 5\' 7"  (1.702 m)   Wt 76.3 kg   SpO2 92%   BMI 26.34 kg/m  CONSTITUTIONAL: No acute distress HEENT:  Normocephalic, atraumatic, extraocular motion intact. NECK: Trachea is midline, and there is no jugular venous distension.  RESPIRATORY:  Lungs are clear, and breath sounds are equal bilaterally. Normal respiratory effort without pathologic use of accessory muscles. CARDIOVASCULAR: Heart is regular without murmurs, gallops, or rubs. GI: The abdomen is soft, mildly distended, with tenderness in the RUQ.  Positive Murphy's sign.  MUSCULOSKELETAL:  Normal muscle strength and tone in all four extremities.  No peripheral edema or cyanosis. SKIN: Skin turgor is normal. There are no pathologic skin lesions.  NEUROLOGIC:  Motor and sensation is grossly normal.  Cranial nerves are grossly intact. PSYCH:  Alert and oriented to person, place and time. Affect is normal.  Laboratory Analysis: Results for  orders placed or performed during the hospital encounter of 02/17/21 (from the past 24 hour(s))  Lipase, blood     Status: None   Collection Time: 02/17/21  7:28 PM  Result Value Ref Range   Lipase 32 11 - 51 U/L  Comprehensive metabolic panel     Status: None   Collection Time: 02/17/21  7:28 PM  Result Value Ref Range   Sodium 137 135 - 145 mmol/L   Potassium 3.5 3.5 - 5.1 mmol/L   Chloride 104 98 - 111 mmol/L   CO2 24 22 - 32 mmol/L   Glucose, Bld 98 70 - 99 mg/dL   BUN 20 8 - 23 mg/dL   Creatinine, Ser 0.90 0.44 - 1.00 mg/dL   Calcium 9.3 8.9 - 10.3 mg/dL   Total Protein 7.2 6.5 - 8.1 g/dL   Albumin 4.1 3.5 - 5.0 g/dL   AST 19 15 -  41 U/L   ALT 21 0 - 44 U/L   Alkaline Phosphatase 104 38 - 126 U/L   Total Bilirubin 0.6 0.3 - 1.2 mg/dL   GFR, Estimated >60 >60 mL/min   Anion gap 9 5 - 15  CBC     Status: Abnormal   Collection Time: 02/17/21  7:28 PM  Result Value Ref Range   WBC 10.8 (H) 4.0 - 10.5 K/uL   RBC 4.36 3.87 - 5.11 MIL/uL   Hemoglobin 13.3 12.0 - 15.0 g/dL   HCT 39.2 36.0 - 46.0 %   MCV 89.9 80.0 - 100.0 fL   MCH 30.5 26.0 - 34.0 pg   MCHC 33.9 30.0 - 36.0 g/dL   RDW 12.7 11.5 - 15.5 %   Platelets 258 150 - 400 K/uL   nRBC 0.0 0.0 - 0.2 %  Urinalysis, Complete w Microscopic Urine, Clean Catch     Status: Abnormal   Collection Time: 02/17/21  9:58 PM  Result Value Ref Range   Color, Urine STRAW (A) YELLOW   APPearance CLEAR (A) CLEAR   Specific Gravity, Urine 1.009 1.005 - 1.030   pH 5.0 5.0 - 8.0   Glucose, UA NEGATIVE NEGATIVE mg/dL   Hgb urine dipstick SMALL (A) NEGATIVE   Bilirubin Urine NEGATIVE NEGATIVE   Ketones, ur NEGATIVE NEGATIVE mg/dL   Protein, ur NEGATIVE NEGATIVE mg/dL   Nitrite NEGATIVE NEGATIVE   Leukocytes,Ua NEGATIVE NEGATIVE   RBC / HPF 0-5 0 - 5 RBC/hpf   WBC, UA 0-5 0 - 5 WBC/hpf   Bacteria, UA NONE SEEN NONE SEEN   Squamous Epithelial / LPF NONE SEEN 0 - 5  Resp Panel by RT-PCR (Flu A&B, Covid) Urine, Clean Catch     Status:  None   Collection Time: 02/17/21  9:58 PM   Specimen: Urine, Clean Catch; Nasopharyngeal(NP) swabs in vial transport medium  Result Value Ref Range   SARS Coronavirus 2 by RT PCR NEGATIVE NEGATIVE   Influenza A by PCR NEGATIVE NEGATIVE   Influenza B by PCR NEGATIVE NEGATIVE  Magnesium     Status: None   Collection Time: 02/18/21  4:52 AM  Result Value Ref Range   Magnesium 1.8 1.7 - 2.4 mg/dL  CBC     Status: None   Collection Time: 02/18/21  4:52 AM  Result Value Ref Range   WBC 8.0 4.0 - 10.5 K/uL   RBC 4.24 3.87 - 5.11 MIL/uL   Hemoglobin 13.1 12.0 - 15.0 g/dL   HCT 38.4 36.0 - 46.0 %   MCV 90.6 80.0 - 100.0 fL   MCH 30.9 26.0 - 34.0 pg   MCHC 34.1 30.0 - 36.0 g/dL   RDW 12.8 11.5 - 15.5 %   Platelets 224 150 - 400 K/uL   nRBC 0.0 0.0 - 0.2 %  Comprehensive metabolic panel     Status: Abnormal   Collection Time: 02/18/21  4:52 AM  Result Value Ref Range   Sodium 139 135 - 145 mmol/L   Potassium 3.8 3.5 - 5.1 mmol/L   Chloride 107 98 - 111 mmol/L   CO2 25 22 - 32 mmol/L   Glucose, Bld 97 70 - 99 mg/dL   BUN 16 8 - 23 mg/dL   Creatinine, Ser 0.86 0.44 - 1.00 mg/dL   Calcium 8.6 (L) 8.9 - 10.3 mg/dL   Total Protein 6.3 (L) 6.5 - 8.1 g/dL   Albumin 3.4 (L) 3.5 - 5.0 g/dL   AST 40 15 - 41 U/L  ALT 32 0 - 44 U/L   Alkaline Phosphatase 99 38 - 126 U/L   Total Bilirubin 0.7 0.3 - 1.2 mg/dL   GFR, Estimated >60 >60 mL/min   Anion gap 7 5 - 15    Imaging: US Abdomen Limited RUQ (LIVER/GB)  Result Date: 02/17/2021 CLINICAL DATA:  Abdomen pain with bloating EXAM: ULTRASOUND ABDOMEN LIMITED RIGHT UPPER QUADRANT COMPARISON:  None. FINDINGS: Gallbladder: Contracted gallbladder with multiple shadowing stones measuring up to 8 mm. Normal wall thickness. Negative sonographic Murphy. Common bile duct: Diameter: 3.5 mm Liver: Increased hepatic echogenicity. Circumscribed hypoechoic lesion in the right hepatic lobe measuring 3.1 x 1.9 x 2.1 cm. Portal vein is patent on color Doppler  imaging with normal direction of blood flow towards the liver. Other: None. IMPRESSION: 1. Contracted gallbladder with stones but no sonographic evidence for acute cholecystitis 2. Hepatic steatosis. 3.1 cm focal hypoechoic lesion in the right hepatic lobe indeterminate for hypoechoic mass or geographic focal fat sparing. When the patient is clinically stable and able to follow directions and hold their breath (preferably as an outpatient) further evaluation with dedicated abdominal MRI should be considered. Electronically Signed   By: Donavan Foil M.D.   On: 02/17/2021 20:49    Assessment and Plan: This is a 70 y.o. female with clinical acute cholecystitis.  --Discussed with the patient the role for robotic assisted cholecystectomy.  Reviewed the risks of bleeding, infection, injury to surrounding structures and she's willing to proceed.  Will take her to OR this morning.  Discussed possibility of open surgery, drain placement.  If she does very well, could potentially d/c later today, but more likely tomorrow morning.  Patient understands and all of her questions have been answered.   Melvyn Neth, MD [facility] Pg:  [phone]

## 2021-02-18 NOTE — Op Note (Signed)
  Procedure Date:  02/18/2021  Pre-operative Diagnosis:  Acute cholecystitis  Post-operative Diagnosis:  Acute cholecystitis  Procedure:  Robotic assisted cholecystectomy with ICG FireFly cholangiogram  Surgeon:  Melvyn Neth, MD  Anesthesia:  General endotracheal  Estimated Blood Loss:  15 ml  Specimens:  gallbladder  Complications:  None  Indications for Procedure:  This is a 70 y.o. female who presents with abdominal pain and workup revealing acute cholecystitis.  The benefits, complications, treatment options, and expected outcomes were discussed with the patient. The risks of bleeding, infection, recurrence of symptoms, failure to resolve symptoms, bile duct damage, bile duct leak, retained common bile duct stone, bowel injury, and need for further procedures were all discussed with the patient and she was willing to proceed.  Description of Procedure: The patient was correctly identified in the preoperative area and brought into the operating room.  The patient was placed supine with VTE prophylaxis in place.  Appropriate time-outs were performed.  Anesthesia was induced and the patient was intubated.  Appropriate antibiotics were infused.  The abdomen was prepped and draped in a sterile fashion. An infraumbilical incision was made. A cutdown technique was used to enter the abdominal cavity without injury, and a 12 mm robotic port was inserted.  Pneumoperitoneum was obtained with appropriate opening pressures.  Three 8-mm ports were placed in the mid abdomen at the level of the umbilicus under direct visualization.  The DaVinci platform was docked, camera targeted, and instruments were placed under direct visualization.  The gallbladder was identified.  The gallbladder was edematous and distended.  The fundus was grasped and retracted cephalad.  Adhesions were lysed bluntly and with electrocautery. The infundibulum was grasped and retracted laterally, exposing the peritoneum  overlying the gallbladder.  This was incised with electrocautery and extended on either side of the gallbladder.  FireFly cholangiogram was then obtained, and we were able to clearly identify the cystic duct and common bile duct.  The cystic duct and cystic artery were carefully dissected with combination of cautery and blunt dissection.  Both were clipped twice proximally and once distally, cutting in between.  The gallbladder was taken from the gallbladder fossa in a retrograde fashion with electrocautery. The gallbladder was placed in an Endocatch bag. The liver bed was inspected and any bleeding was controlled with electrocautery. The right upper quadrant was then inspected again revealing intact clips, no bleeding, and no ductal injury.  The area was thoroughly irrigated.  The 8 mm ports were removed under direct visualization and the 12 mm port was removed.  The Endocatch bag was brought out via the umbilical incision. The fascial opening was closed using 0 vicryl suture.  Local anesthetic was infused in all incisions and the incisions were closed with 4-0 Monocryl.  The wounds were cleaned and sealed with DermaBond.  The patient was emerged from anesthesia and extubated and brought to the recovery room for further management.  The patient tolerated the procedure well and all counts were correct at the end of the case.   Melvyn Neth, MD

## 2021-02-18 NOTE — Anesthesia Procedure Notes (Signed)
Procedure Name: Intubation Date/Time: 02/18/2021 10:24 AM Performed by: Leander Rams, CRNA Pre-anesthesia Checklist: Patient identified, Emergency Drugs available, Suction available and Patient being monitored Patient Re-evaluated:Patient Re-evaluated prior to induction Oxygen Delivery Method: Circle system utilized Preoxygenation: Pre-oxygenation with 100% oxygen Induction Type: IV induction Ventilation: Mask ventilation without difficulty Laryngoscope Size: McGraph and 3 Grade View: Grade I Tube type: Oral Tube size: 7.0 mm Number of attempts: 1 Airway Equipment and Method: Stylet Secured at: 21 cm Tube secured with: Tape Dental Injury: Teeth and Oropharynx as per pre-operative assessment

## 2021-02-18 NOTE — Anesthesia Preprocedure Evaluation (Addendum)
Anesthesia Evaluation  Patient identified by MRN, date of birth, ID band Patient awake    Reviewed: Allergy & Precautions, NPO status , Patient's Chart, lab work & pertinent test results  Airway Mallampati: II  TM Distance: >3 FB     Dental  (+) Upper Dentures, Lower Dentures   Pulmonary COPD,  COPD inhaler, former smoker,    Pulmonary exam normal        Cardiovascular hypertension, + angina + CAD, + Peripheral Vascular Disease and + DOE  Normal cardiovascular exam     Neuro/Psych PSYCHIATRIC DISORDERS Anxiety  Neuromuscular disease    GI/Hepatic Neg liver ROS,   Endo/Other  negative endocrine ROS  Renal/GU negative Renal ROS  negative genitourinary   Musculoskeletal  (+) Arthritis , Osteoarthritis,    Abdominal Normal abdominal exam  (+)   Peds negative pediatric ROS (+)  Hematology negative hematology ROS (+)   Anesthesia Other Findings Past Medical History: No date: Anxiety No date: Aortic valve sclerosis No date: Bilateral carotid bruits No date: Carotid artery stenosis No date: Hypertension No date: Mitral regurgitation No date: Mixed hyperlipidemia No date: Shoulder pain No date: Sleeping difficulties  Reproductive/Obstetrics                            Anesthesia Physical Anesthesia Plan  ASA: III and emergent  Anesthesia Plan: General   Post-op Pain Management:    Induction: Intravenous  PONV Risk Score and Plan:   Airway Management Planned: Oral ETT  Additional Equipment:   Intra-op Plan:   Post-operative Plan: Extubation in OR  Informed Consent: I have reviewed the patients History and Physical, chart, labs and discussed the procedure including the risks, benefits and alternatives for the proposed anesthesia with the patient or authorized representative who has indicated his/her understanding and acceptance.     Dental advisory given  Plan Discussed with:  CRNA and Surgeon  Anesthesia Plan Comments:         Anesthesia Quick Evaluation

## 2021-02-18 NOTE — Transfer of Care (Signed)
Immediate Anesthesia Transfer of Care Note  Patient: Kristin Parker  Procedure(s) Performed: XI ROBOTIC ASSISTED LAPAROSCOPIC CHOLECYSTECTOMY (N/A Abdomen) INDOCYANINE GREEN FLUORESCENCE IMAGING (ICG) (N/A )  Patient Location: PACU  Anesthesia Type:General  Level of Consciousness: sedated  Airway & Oxygen Therapy: Patient Spontanous Breathing  Post-op Assessment: Report given to RN  Post vital signs: stable  Last Vitals:  Vitals Value Taken Time  BP    Temp    Pulse 73 02/18/21 1232  Resp 19 02/18/21 1232  SpO2 97 % 02/18/21 1232  Vitals shown include unvalidated device data.  Last Pain:  Vitals:   02/18/21 0805  TempSrc: Oral  PainSc: 2          Complications: No complications documented.

## 2021-02-19 MED ORDER — OXYCODONE HCL 5 MG PO TABS: 5.0000 mg | ORAL_TABLET | ORAL | 0 refills | Status: DC | PRN

## 2021-02-19 MED ORDER — IBUPROFEN 600 MG PO TABS
600.0000 mg | ORAL_TABLET | Freq: Three times a day (TID) | ORAL | 1 refills | Status: DC | PRN
Start: 2021-02-19 — End: 2021-11-28

## 2021-02-19 MED ORDER — ACETAMINOPHEN 500 MG PO TABS: 1000.0000 mg | ORAL_TABLET | Freq: Four times a day (QID) | ORAL | Status: AC | PRN

## 2021-02-19 MED ORDER — AMOXICILLIN-POT CLAVULANATE 875-125 MG PO TABS: 1.0000 | ORAL_TABLET | Freq: Two times a day (BID) | ORAL | 0 refills | Status: AC

## 2021-02-19 NOTE — Plan of Care (Signed)

## 2021-02-19 NOTE — Progress Notes (Deleted)
Office Visit    Patient Name: Kristin Parker Date of Encounter: 02/20/2021  Primary Care Provider:  Olin Hauser, DO Primary Cardiologist:  [name], MD Electrophysiologist:  None   Chief Complaint    Kristin Parker is a 70 y.o. female with a hx of  CAD (up to 40% distal LMCA by cath 05/2017), carotid artery stenosis, HTN, HLD, depression, anxiety, short chronic shortness of breath, tobacco use  presents today for follow-up of CAD.  Past Medical History    Past Medical History:  Diagnosis Date  . Anxiety   . Aortic valve sclerosis   . Bilateral carotid bruits   . Carotid artery stenosis   . Hypertension   . Mitral regurgitation   . Mixed hyperlipidemia   . Shoulder pain   . Sleeping difficulties    Past Surgical History:  Procedure Laterality Date  . BREAST BIOPSY Left 08/13/2018   affirm bx, x clip, path pending  . CATARACT EXTRACTION Left    2017  . COLONOSCOPY  2012   Dr Candace Cruise  . LEFT HEART CATH AND CORONARY ANGIOGRAPHY N/A 06/25/2017   Procedure: LEFT HEART CATH AND CORONARY ANGIOGRAPHY;  Surgeon: [name], MD;  Location: [facility];  Service: Cardiovascular;  Laterality: N/A;   Allergies  Allergies  Allergen Reactions  . Bactrim [Sulfamethoxazole-Trimethoprim] Nausea And Vomiting  . Aleve [Naproxen Sodium] Swelling    Lips. But patient can take Ibuprofen without problems   History of Present Illness    Kristin Parker is a 70 y.o. female with a hx of  CAD (up to 40% distal LMCA by cath 05/2017), carotid artery stenosis, HTN, HLD, statin intolerance with myalgia, depression, anxiety, short chronic shortness of breath, tobacco use last seen 11/28/20.  Echo 02/2017 with LVEF 60-65%, gr1DD, mild MR, normal PASP. Carotid duplex 02/2018 with 40-59% RICA stenosis and 4-43% LICA stenosis.  Cardiac cath 05/2017 with up to 40% stenosis of distal LMCA. She has a longstanding history of dyspnea on exertion.   At clinic visit  07/19/2019 she noted myalgias and was given a statin holiday of 1 month.  Her myalgias resolved and she has been able to tolerate Zetia without difficulty.    She had a repeat carotid duplex 10/08/2019 bilateral ICA with 1-39% stenosis.  She was seen by Dr. Saunders Revel  09/15/2020 and noted worsening exertional dyspnea as well as more forceful pounding over the last 3 to 4 months.  She was recommended for cardiac CTA as well as echocardiogram.  Cardiac CTA 09/29/2020 showed coronary calcium score of 192 placing her in the 79th percentile for age/sex matched control. She had calcified plaque causing mild stenosis Echocardiogram 10/12/2020 showed LVEF 55 to 60%, no wall motion abnormalities, mild LVH, grade 1 diastolic dysfunction, LV global longitudinal strain -15.2%, trivial MR, mild aortic valve sclerosis without stenosis.  She was seen in follow-up 11/29/2019.  She had quit smoking and was congratulated.  She was referred to Dr. Debara Pickett in the lipid clinic for management of her hyperlipidemia in the setting of statin intolerance.  She presents today for follow-up.  ***  EKGs/Labs/Other Studies Reviewed:   The following studies were reviewed today:  Echo 10/12/20  1. Left ventricular ejection fraction, by estimation, is 55 to 60%. The  left ventricle has normal function. The left ventricle has no regional  wall motion abnormalities. There is mild left ventricular hypertrophy.  Left ventricular diastolic parameters  are consistent with Grade I diastolic dysfunction (impaired relaxation).  The average left ventricular global longitudinal strain is -15.2 %. The  global longitudinal strain is abnormal.   2. Right ventricular systolic function is normal. The right ventricular  size is normal.   3. The mitral valve is normal in structure. Trivial mitral valve  regurgitation.   4. The aortic valve is tricuspid. Aortic valve regurgitation is not  visualized. Mild aortic valve sclerosis is present, with  no evidence of  aortic valve stenosis.   5. The inferior vena cava is normal in size with greater than 50%  respiratory variability, suggesting right atrial pressure of 3 mmHg.   Cardiac CT 09/29/2020 IMPRESSION: 1. Coronary calcium score of 192. This was 79th percentile for age and sex matched control.   2. Normal coronary origin with left dominance.   3. Calcified plaque causing mild stenosis in the proximal LM and LCx   4. Moderate ascending and descending aortic wall calcifications   5. Grade 3 atheroma in the descending thoracic aorta   6. CAD-RADS 2. Mild non-obstructive CAD (25-49%). Consider non-atherosclerotic causes of chest pain. Consider preventive therapy and risk factor modification. Recommend asa, statin if no contraindication.    Carotid Duplex 10/08/19: Right Carotid: Velocities in the right ICA are consistent with a 1-39% stenosis. Non-hemodynamically significant plaque <50% noted in the  CCA. The ECA appears <50% stenosed. Left Carotid: Velocities in the left ICA are consistent with a 1-39% stenosis. Non-hemodynamically significant plaque <50% noted in the CCA. The ECA appears <50% stenosed.    LHC (06/25/17): Mild to moderate CAD, including 40% distal LMCA, 20% ostial LAD, 20-30% mid LAD, and sequential 30% proximal and mid LCx stenoses.  Small, nondominant RCA without significant disease.  Normal left ventricular filling pressure.    ETT (06/18/17): Abnormal exercise tolerance test, the target heart rate not achieved.  1-2 mm horizontal ST depressions noted in inferior leads.  Patient only achieved 84% MPHR due to shortness of breath and fatigue   Carotid artery Duplex (03/01/17): Smooth plaque, bilaterally. 40-59% RICA stenosis. 8-78% LICA stenosis. Patent vertebral arteries with antegrade flow. Normal subclavian arteries, bilaterally.   TTE (03/01/17): Normal LV size and function with LVEF 60-65% and grade 1 diastolic dysfunction. Mild MR. Normal RV size and function.  Normal PA pressure.   EKG: No EKG today***  Recent Labs: 02/18/2021: ALT 32; BUN 16; Creatinine, Ser 0.86; Hemoglobin 13.1; Magnesium 1.8; Platelets 224; Potassium 3.8; Sodium 139  Recent Lipid Panel    Component Value Date/Time   CHOL 305 (H) 09/13/2020 0755   CHOL 135 07/22/2019 1034   TRIG 314 (H) 09/13/2020 0755   HDL 45 (L) 09/13/2020 0755   HDL 43 07/22/2019 1034   CHOLHDL 6.8 (H) 09/13/2020 0755   VLDL NOT CALCULATED 05/28/2017 1008   LDLCALC 207 (H) 09/13/2020 0755   LDLDIRECT 62 05/28/2017 1008    Home Medications   No outpatient medications have been marked as taking for the 02/20/21 encounter (Appointment) with Loel Dubonnet, NP.    Review of Systems  All other systems reviewed and are otherwise negative except as noted above.  Physical Exam    VS:  There were no vitals taken for this visit. , BMI There is no height or weight on file to calculate BMI.  Wt Readings from Last 3 Encounters:  02/17/21 168 lb 3.2 oz (76.3 kg)  02/08/21 168 lb 3.2 oz (76.3 kg)  11/30/20 162 lb (73.5 kg)   *** GEN: Well nourished, overweight, well developed, in no acute distress. HEENT:  normal. Neck: Supple, no JVD, carotid bruits, or masses. Cardiac: RRR, no murmurs, rubs, or gallops. No clubbing, cyanosis, edema.  Radials/DP/PT 2+ and equal bilaterally.  Respiratory:  Respirations regular and unlabored, clear to auscultation bilaterally. GI: Soft, nontender, nondistended. MS: No deformity or atrophy. Skin: Warm and dry, no rash. Neuro:  Strength and sensation are intact. Psych: Normal affect.  Assessment & Plan    1. CAD-cardiac CTA 09/2020 with coronary calcium score 192, 79th percentile for age/sex withCAD-RADS 2 mild nonobstructive CAD (25-49%) of the LM and LCx.Marland Kitchen GDMT includes aspirin,, Zetia.  No statin secondary to previous myalgias***  2. DOE / COPD- Cardiac CTA and echocardiogram unrevealing for etiology of her dyspnea.  Likely related to her COPD and long history of  tobacco use.  Politely declines pulmonology referral.  Deconditioning also likely contributory.  Encouraged to follow-up with primary care provider.***  3. Mitral regurgitation-trivial by echo 09/2020.  Continue optimal BP and volume control.***  4. Carotid artery stenosis-carotid duplex 10/08/2019 with bilateral 1-79% stenosis.  Cholesterol management, as below.  Continue aspirin 81 mg daily.***  5. Hypertension- BP well controlled. Continue current antihypertensive regimen of amlodipine 10 mg daily, Imdur 60 mg daily, losartan 100 mg daily..***   6. Myalgias / Statin intolerance - Secondary to statin. Resolved since discontinuation of Crestor 20mg  and Simvastatin 20mg . ***  7. Hyperlipidemia, LDL goal less than 70- following with Dr. Debara Pickett of lipid clinic.***  8. Tobacco use - Has not smoked in 3 weeks.  Congratulated.  Encouraged complete smoking cessation and provided her resources on coping with quitting smoking.***  Disposition: Follow up*** in 3 month(s) with Dr. Saunders Revel or APP   Signed, Loel Dubonnet, NP 02/20/2021, 8:06 AM [facility]

## 2021-02-19 NOTE — Discharge Summary (Signed)
Patient ID: Kristin Parker MRN: [id number] DOB/AGE: 1950-12-28 70 y.o.  Admit date: 02/17/2021 Discharge date: 02/19/2021   Discharge Diagnoses:  Active Problems:   Acute cholecystitis   Procedures:  Robotic assisted cholecystectomy with ICG cholangiogram  Hospital Course:   Patient was admitted on 02/17/2021 with clinical acute cholecystitis.  She was taken to the operating room on 4/23 for a robotic assisted cholecystectomy.  She tolerated the procedure well.  Her gallbladder was inflamed and edematous.  No drain was needed.  Postoperatively, she recovered well and her diet has been advanced appropriately and she is tolerating well.  Her pain is well controlled.  She is ambulating, having flatus, and she is ready for discharge today.  She will follow-up in 2 weeks.  On exam, the patient's vital signs are stable and within normal limits.  She is in no acute distress.  Her abdomen is soft, nondistended, appropriately tender to palpation.  Incisions are clean, dry, intact with Dermabond in place.  Consults: None  Disposition: Discharge disposition: 01-Home or Self Care       Discharge Instructions    Call MD for:  difficulty breathing, headache or visual disturbances   Complete by: As directed    Call MD for:  persistant nausea and vomiting   Complete by: As directed    Call MD for:  redness, tenderness, or signs of infection (pain, swelling, redness, odor or green/yellow discharge around incision site)   Complete by: As directed    Call MD for:  severe uncontrolled pain   Complete by: As directed    Call MD for:  temperature >100.4   Complete by: As directed    Diet general   Complete by: As directed    Low fat diet for the next 2-3 weeks while your body adjusts to not having a gallbladder.   Discharge instructions   Complete by: As directed    1.  Patient may shower, but do not scrub wounds heavily and dab dry only. 2.  Do not submerge wounds in pool/tub for 1 week. 3.   Do not apply ointments or hydrogen peroxide to the wounds. 4.  May apply ice packs to the wounds for comfort.   Driving Restrictions   Complete by: As directed    Do not drive while taking narcotics for pain control.   Increase activity slowly   Complete by: As directed    Lifting restrictions   Complete by: As directed    No heavy lifting or pushing of more than 10-15 lbs for 4 weeks.   No dressing needed   Complete by: As directed      Allergies as of 02/19/2021      Reactions   Bactrim [sulfamethoxazole-trimethoprim] Nausea And Vomiting   Aleve [naproxen Sodium] Swelling   Lips. But patient can take Ibuprofen without problems      Medication List    TAKE these medications   acetaminophen 500 MG tablet Commonly known as: TYLENOL Take 2 tablets (1,000 mg total) by mouth every 6 (six) hours as needed for mild pain.   albuterol 108 (90 Base) MCG/ACT inhaler Commonly known as: VENTOLIN HFA INHALE 2 PUFFS EVERY 4 HOURS FOR WHEEZING OR SHORTNESS OF BREATH AS NEEDED   amLODipine 10 MG tablet Commonly known as: NORVASC Take 1 tablet (10 mg total) by mouth daily.   amoxicillin-clavulanate 875-125 MG tablet Commonly known as: Augmentin Take 1 tablet by mouth 2 (two) times daily for 10 days.   aspirin EC 81  MG tablet Take 1 tablet (81 mg total) by mouth daily.   dicyclomine 10 MG capsule Commonly known as: Bentyl Take 1 capsule (10 mg total) by mouth 3 (three) times daily before meals.   ezetimibe 10 MG tablet Commonly known as: ZETIA TAKE 1 TABLET BY MOUTH ONCE DAILY   Fish Oil 1000 MG Caps Take 1 capsule (1,000 mg total) by mouth 2 (two) times daily.   fluticasone 50 MCG/ACT nasal spray Commonly known as: FLONASE USE 2 SPRAYS INTO EACH NOSTRIL ONCE DAILY   hydrOXYzine 10 MG tablet Commonly known as: ATARAX/VISTARIL TAKE 1 TABLET BY MOUTH AT BEDTIME AS NEEDED ANXIETY/SLEEP   ibuprofen 600 MG tablet Commonly known as: ADVIL Take 1 tablet (600 mg total) by mouth  every 8 (eight) hours as needed for moderate pain.   isosorbide mononitrate 60 MG 24 hr tablet Commonly known as: IMDUR TAKE 1 TABLET BY MOUTH ONCE DAILY   losartan 100 MG tablet Commonly known as: COZAAR Take 1 tablet (100 mg total) by mouth daily.   meloxicam 15 MG tablet Commonly known as: MOBIC TAKE 1 TABLET BY MOUTH ONCE DAILY AS NEEDED FOR PAIN WITH FOOD   oxyCODONE 5 MG immediate release tablet Commonly known as: Oxy IR/ROXICODONE Take 1 tablet (5 mg total) by mouth every 4 (four) hours as needed for severe pain.   rosuvastatin 5 MG tablet Commonly known as: CRESTOR Take 1 tablet (5 mg total) by mouth daily.   sertraline 100 MG tablet Commonly known as: ZOLOFT Take 1 tablet (100 mg total) by mouth daily.   Spiriva HandiHaler 18 MCG inhalation capsule Generic drug: tiotropium INHALE CONTENTS OF 1 CAPSULE ONCE DAILY            Discharge Care Instructions  (From admission, onward)         Start     Ordered   02/19/21 0000  No dressing needed        02/19/21 1106          Follow-up Information    Tylene Fantasia, PA-C Follow up in 2 week(s).   Specialty: Physician Assistant Contact information: [address] [place] [address] [phone]

## 2021-02-19 NOTE — Progress Notes (Signed)
AVS reviewed with pt and pt verbalized understanding of all instructions. NAD noted or voiced concerns prior to discharge.

## 2021-02-20 ENCOUNTER — Ambulatory Visit: Payer: Medicare HMO | Admitting: Family

## 2021-02-21 LAB — SURGICAL PATHOLOGY

## 2021-02-28 ENCOUNTER — Other Ambulatory Visit: Payer: Self-pay | Admitting: Family Medicine

## 2021-02-28 DIAGNOSIS — Z9049 Acquired absence of other specified parts of digestive tract: Secondary | ICD-10-CM

## 2021-02-28 DIAGNOSIS — R7309 Other abnormal glucose: Secondary | ICD-10-CM

## 2021-02-28 DIAGNOSIS — E782 Mixed hyperlipidemia: Secondary | ICD-10-CM

## 2021-03-01 ENCOUNTER — Ambulatory Visit: Payer: Medicare HMO

## 2021-03-01 ENCOUNTER — Other Ambulatory Visit: Payer: Medicare HMO

## 2021-03-01 DIAGNOSIS — R7309 Other abnormal glucose: Secondary | ICD-10-CM | POA: Diagnosis not present

## 2021-03-01 DIAGNOSIS — Z9049 Acquired absence of other specified parts of digestive tract: Secondary | ICD-10-CM | POA: Diagnosis not present

## 2021-03-01 DIAGNOSIS — E782 Mixed hyperlipidemia: Secondary | ICD-10-CM

## 2021-03-02 LAB — CBC WITH DIFFERENTIAL/PLATELET
Absolute Monocytes: 525 cells/uL (ref 200–950)
Basophils Absolute: 60 cells/uL (ref 0–200)
Basophils Relative: 0.8 %
Eosinophils Absolute: 128 cells/uL (ref 15–500)
Eosinophils Relative: 1.7 %
HCT: 41.8 % (ref 35.0–45.0)
Hemoglobin: 13.6 g/dL (ref 11.7–15.5)
Lymphs Abs: 2415 cells/uL (ref 850–3900)
MCH: 30 pg (ref 27.0–33.0)
MCHC: 32.5 g/dL (ref 32.0–36.0)
MCV: 92.1 fL (ref 80.0–100.0)
MPV: 9.8 fL (ref 7.5–12.5)
Monocytes Relative: 7 %
Neutro Abs: 4373 cells/uL (ref 1500–7800)
Neutrophils Relative %: 58.3 %
Platelets: 280 10*3/uL (ref 140–400)
RBC: 4.54 10*6/uL (ref 3.80–5.10)
RDW: 12.7 % (ref 11.0–15.0)
Total Lymphocyte: 32.2 %
WBC: 7.5 10*3/uL (ref 3.8–10.8)

## 2021-03-02 LAB — COMPLETE METABOLIC PANEL WITH GFR
AG Ratio: 1.5 (calc) (ref 1.0–2.5)
ALT: 19 U/L (ref 6–29)
AST: 16 U/L (ref 10–35)
Albumin: 3.8 g/dL (ref 3.6–5.1)
Alkaline phosphatase (APISO): 112 U/L (ref 37–153)
BUN/Creatinine Ratio: 11 (calc) (ref 6–22)
BUN: 11 mg/dL (ref 7–25)
CO2: 26 mmol/L (ref 20–32)
Calcium: 8.9 mg/dL (ref 8.6–10.4)
Chloride: 107 mmol/L (ref 98–110)
Creat: 0.96 mg/dL — ABNORMAL HIGH (ref 0.60–0.93)
GFR, Est African American: 69 mL/min/{1.73_m2} (ref >=60)
GFR, Est Non African American: 60 mL/min/{1.73_m2} (ref >=60)
Globulin: 2.5 g/dL (calc) (ref 1.9–3.7)
Glucose, Bld: 95 mg/dL (ref 65–99)
Potassium: 3.8 mmol/L (ref 3.5–5.3)
Sodium: 141 mmol/L (ref 135–146)
Total Bilirubin: 0.3 mg/dL (ref 0.2–1.2)
Total Protein: 6.3 g/dL (ref 6.1–8.1)

## 2021-03-02 LAB — LIPID PANEL
Cholesterol: 248 mg/dL — ABNORMAL HIGH (ref <200)
HDL: 32 mg/dL — ABNORMAL LOW (ref >=50)
LDL Cholesterol (Calc): 167 mg/dL (calc) — ABNORMAL HIGH
Non-HDL Cholesterol (Calc): 216 mg/dL (calc) — ABNORMAL HIGH (ref <130)
Total CHOL/HDL Ratio: 7.8 (calc) — ABNORMAL HIGH (ref <5.0)
Triglycerides: 319 mg/dL — ABNORMAL HIGH (ref <150)

## 2021-03-02 LAB — HEMOGLOBIN A1C
Hgb A1c MFr Bld: 5.7 % of total Hgb — ABNORMAL HIGH (ref <5.7)
Mean Plasma Glucose: 117 mg/dL
eAG (mmol/L): 6.5 mmol/L

## 2021-03-08 ENCOUNTER — Encounter: Payer: Self-pay | Admitting: Family Medicine

## 2021-03-08 ENCOUNTER — Encounter: Payer: Self-pay | Admitting: Surgery

## 2021-03-08 ENCOUNTER — Ambulatory Visit: Payer: Medicare HMO | Admitting: Family Medicine

## 2021-03-08 ENCOUNTER — Other Ambulatory Visit: Payer: Self-pay

## 2021-03-08 ENCOUNTER — Other Ambulatory Visit: Payer: Self-pay | Admitting: Family Medicine

## 2021-03-08 ENCOUNTER — Encounter: Payer: Self-pay | Admitting: Physician Assistant

## 2021-03-08 ENCOUNTER — Ambulatory Visit (INDEPENDENT_AMBULATORY_CARE_PROVIDER_SITE_OTHER): Payer: Medicare HMO | Admitting: Surgery

## 2021-03-08 ENCOUNTER — Ambulatory Visit (INDEPENDENT_AMBULATORY_CARE_PROVIDER_SITE_OTHER): Payer: Medicare HMO | Admitting: Family Medicine

## 2021-03-08 VITALS — BP 125/55 | HR 67 | Ht 67.0 in | Wt 168.0 lb

## 2021-03-08 VITALS — BP 142/64 | HR 65 | Temp 98.3°F | Ht 67.0 in | Wt 167.0 lb

## 2021-03-08 DIAGNOSIS — Z1211 Encounter for screening for malignant neoplasm of colon: Secondary | ICD-10-CM | POA: Diagnosis not present

## 2021-03-08 DIAGNOSIS — E782 Mixed hyperlipidemia: Secondary | ICD-10-CM | POA: Diagnosis not present

## 2021-03-08 DIAGNOSIS — Z9049 Acquired absence of other specified parts of digestive tract: Secondary | ICD-10-CM | POA: Diagnosis not present

## 2021-03-08 DIAGNOSIS — G8929 Other chronic pain: Secondary | ICD-10-CM | POA: Diagnosis not present

## 2021-03-08 DIAGNOSIS — Z124 Encounter for screening for malignant neoplasm of cervix: Secondary | ICD-10-CM

## 2021-03-08 DIAGNOSIS — Z0000 Encounter for general adult medical examination without abnormal findings: Secondary | ICD-10-CM

## 2021-03-08 DIAGNOSIS — R140 Abdominal distension (gaseous): Secondary | ICD-10-CM

## 2021-03-08 DIAGNOSIS — M25511 Pain in right shoulder: Secondary | ICD-10-CM | POA: Diagnosis not present

## 2021-03-08 DIAGNOSIS — R7309 Other abnormal glucose: Secondary | ICD-10-CM

## 2021-03-08 DIAGNOSIS — L239 Allergic contact dermatitis, unspecified cause: Secondary | ICD-10-CM | POA: Diagnosis not present

## 2021-03-08 DIAGNOSIS — I10 Essential (primary) hypertension: Secondary | ICD-10-CM

## 2021-03-08 MED ORDER — HYDROXYZINE HCL 10 MG PO TABS: 10.0000 mg | ORAL_TABLET | Freq: Every evening | ORAL | 3 refills | Status: DC | PRN

## 2021-03-08 NOTE — Progress Notes (Signed)
Subjective:    Patient ID: Kristin Parker, female    DOB: 30-Sep-1951, 70 y.o.   MRN: [id number]  Kristin Parker is a 70 y.o. female presenting on 03/08/2021 for Hypertension and Shoulder Pain   HPI   Follow-up Cholecystitis S./p Cholecystectomy Robotic Recent visit with me 4/13, had work up, following that limited improvement, went to ED confirmed cholecystitis, had surgery next day. F/u with Gen Surgery - they referred for CT / GI apt, and upcoming screening colonoscopy.  Elevated A1c Prior A1c 5.4 to 5.6, now up to 5.7, mild elevated Meds:Never on med Currently on ARB Lifestyle: - Diet (admits eats cookies and some sweets, not following low carb diet) - Exercise (limited) Denies hypoglycemia  CHRONIC HTN: Reports no concerns Current Meds - Losartan 100mg  daily, Amlodipine 10mg  daily Reports good compliance, took meds today. Tolerating well, w/o complaints. Denies CP, dyspnea, HA, edema, dizziness / lightheadedness  Hyperlipidemia Followed by [facility] Failed multiple meds in past including statins intermittent statin, rosuvastatin, recently zetia. Had myalgia. History of myalgia on statin, with drug induced myopathy could not tolerate   COPD / Tobacco Abuse Reports taking Spiriva 1 puff daily with good results. Still trying to reduce smoking, now mostly in AM and after meals   Anxiety / Insomnia Continues on Sertraline 100mg  daily with good results. - needs refill  FOLLOW-UP Chronic R shoulder pain Prior history subacromial injection with improvement. Due for repeat now, she declines orthopedic Limited with range of motion above head Wants to delay surgery  Depression screen [facility] 2/9 03/08/2021 09/08/2020 12/04/2019  Decreased Interest 0 0 0  Down, Depressed, Hopeless 0 1 0  PHQ - 2 Score 0 1 0  Altered sleeping 1 2 -  Tired, decreased energy 0 1 -  Change in appetite 0 0 -  Feeling bad or failure about yourself  0 0 -  Trouble  concentrating 0 0 -  Moving slowly or fidgety/restless 0 0 -  Suicidal thoughts 0 0 -  PHQ-9 Score 1 4 -  Difficult doing work/chores Not difficult at all Somewhat difficult -  Some recent data might be hidden    Social History   Tobacco Use  . Smoking status: Former Smoker    Packs/day: 0.50    Years: 30.00    Pack years: 15.00    Types: Cigarettes    Quit date: 10/30/2020    Years since quitting: 0.3  . Smokeless tobacco: Never Used  . Tobacco comment: not quite ready due to covid-19   Vaping Use  . Vaping Use: Former  Substance Use Topics  . Alcohol use: No    Alcohol/week: 0.0 standard drinks  . Drug use: No    Review of Systems Per HPI unless specifically indicated above     Objective:    BP (!) 125/55   Pulse 67   Ht 5\' 7"  (1.702 m)   Wt 168 lb (76.2 kg)   SpO2 95%   BMI 26.31 kg/m   Wt Readings from Last 3 Encounters:  03/08/21 168 lb (76.2 kg)  03/08/21 167 lb (75.8 kg)  02/17/21 168 lb 3.2 oz (76.3 kg)    Physical Exam Vitals and nursing note reviewed.  Constitutional:      General: She is not in acute distress.    Appearance: She is well-developed. She is not diaphoretic.     Comments: Well-appearing, comfortable, cooperative  HENT:     Head: Normocephalic and atraumatic.  Eyes:  General:        Right eye: No discharge.        Left eye: No discharge.     Conjunctiva/sclera: Conjunctivae normal.  Cardiovascular:     Rate and Rhythm: Normal rate.  Pulmonary:     Effort: Pulmonary effort is normal.  Abdominal:     General: There is distension.  Skin:    General: Skin is warm and dry.     Findings: No erythema or rash.  Neurological:     Mental Status: She is alert and oriented to person, place, and time.  Psychiatric:        Behavior: Behavior normal.     Comments: Well groomed, good eye contact, normal speech and thoughts    Results for orders placed or performed in visit on 03/01/21  Lipid panel  Result Value Ref Range    Cholesterol 248 (H) <200 mg/dL   HDL 32 (L) > OR = 50 mg/dL   Triglycerides 319 (H) <150 mg/dL   LDL Cholesterol (Calc) 167 (H) mg/dL (calc)   Total CHOL/HDL Ratio 7.8 (H) <5.0 (calc)   Non-HDL Cholesterol (Calc) 216 (H) <130 mg/dL (calc)  COMPLETE METABOLIC PANEL WITH GFR  Result Value Ref Range   Glucose, Bld 95 65 - 99 mg/dL   BUN 11 7 - 25 mg/dL   Creat 0.96 (H) 0.60 - 0.93 mg/dL   GFR, Est Non African American 60 > OR = 60 mL/min/1.68m2   GFR, Est African American 69 > OR = 60 mL/min/1.3m2   BUN/Creatinine Ratio 11 6 - 22 (calc)   Sodium 141 135 - 146 mmol/L   Potassium 3.8 3.5 - 5.3 mmol/L   Chloride 107 98 - 110 mmol/L   CO2 26 20 - 32 mmol/L   Calcium 8.9 8.6 - 10.4 mg/dL   Total Protein 6.3 6.1 - 8.1 g/dL   Albumin 3.8 3.6 - 5.1 g/dL   Globulin 2.5 1.9 - 3.7 g/dL (calc)   AG Ratio 1.5 1.0 - 2.5 (calc)   Total Bilirubin 0.3 0.2 - 1.2 mg/dL   Alkaline phosphatase (APISO) 112 37 - 153 U/L   AST 16 10 - 35 U/L   ALT 19 6 - 29 U/L  CBC with Differential/Platelet  Result Value Ref Range   WBC 7.5 3.8 - 10.8 Thousand/uL   RBC 4.54 3.80 - 5.10 Million/uL   Hemoglobin 13.6 11.7 - 15.5 g/dL   HCT 41.8 35.0 - 45.0 %   MCV 92.1 80.0 - 100.0 fL   MCH 30.0 27.0 - 33.0 pg   MCHC 32.5 32.0 - 36.0 g/dL   RDW 12.7 11.0 - 15.0 %   Platelets 280 140 - 400 Thousand/uL   MPV 9.8 7.5 - 12.5 fL   Neutro Abs 4,373 1,500 - 7,800 cells/uL   Lymphs Abs 2,415 850 - 3,900 cells/uL   Absolute Monocytes 525 200 - 950 cells/uL   Eosinophils Absolute 128 15 - 500 cells/uL   Basophils Absolute 60 0 - 200 cells/uL   Neutrophils Relative % 58.3 %   Total Lymphocyte 32.2 %   Monocytes Relative 7.0 %   Eosinophils Relative 1.7 %   Basophils Relative 0.8 %  Hemoglobin A1c  Result Value Ref Range   Hgb A1c MFr Bld 5.7 (H) <5.7 % of total Hgb   Mean Plasma Glucose 117 mg/dL   eAG (mmol/L) 6.5 mmol/L      Assessment & Plan:   Problem List Items Addressed This Visit    Hyperlipidemia  Elevated hemoglobin A1c - Primary   Chronic right shoulder pain   Allergic dermatitis   Relevant Medications   hydrOXYzine (ATARAX/VISTARIL) 10 MG tablet    Other Visit Diagnoses    S/P cholecystectomy          A1c elevated to 5.7 Mild preDM range Will follow-up in 6 months Counseling on lifestyle  S/p Cholecystectomy for cholecystitis  Followed by Gen Surg, seen earlier today who has referred her and arranged for GI and GYN consultation, and CT scan given patient's persistent abdominal distention  Chronic R Shoulder No change at this time Prior injection series Can repeat if indicated F/u with Orthopedic when ready  Meds ordered this encounter  Medications  . hydrOXYzine (ATARAX/VISTARIL) 10 MG tablet    Sig: Take 1 tablet (10 mg total) by mouth at bedtime as needed.    Dispense:  90 tablet    Refill:  3      Follow up plan: Return in about 6 months (around 09/08/2021) for 6 month follow-up fasting lab only then 1 week later Annual Physical.  Future labs ordered for 08/30/21   Nobie Putnam, [facility] 03/08/2021, 2:25 PM

## 2021-03-08 NOTE — Patient Instructions (Addendum)
Thank you for coming to the office today.  Keep apt with Dr Debara Pickett  Encounter Information  Encounter Information   Provider Department Encounter # Center  03/13/2021 2:15 PM Pixie Casino, MD [facility] [id number] [facility]   Recent Labs    09/13/20 0755 03/01/21 0821  HGBA1C 5.4 5.7*   Cholesterol did improve but LDL still elevated, discuss with Dr Debara Pickett  Keep on track with the testing / referral from Surgeons.  DUE for FASTING BLOOD WORK (no food or drink after midnight before the lab appointment, only water or coffee without cream/sugar on the morning of)  SCHEDULE "Lab Only" visit in the morning at the clinic for lab draw in 6 MONTHS   - Make sure Lab Only appointment is at about 1 week before your next appointment, so that results will be available  For Lab Results, once available within 2-3 days of blood draw, you can can log in to MyChart online to view your results and a brief explanation. Also, we can discuss results at next follow-up visit.    Please schedule a Follow-up Appointment to: Return in about 6 months (around 09/08/2021) for 6 month follow-up fasting lab only then 1 week later Annual Physical.  If you have any other questions or concerns, please feel free to call the office or send a message through [facility]. You may also schedule an earlier appointment if necessary.  Additionally, you may be receiving a survey about your experience at our office within a few days to 1 week by e-mail or mail. We value your feedback.  Nobie Putnam, DO [place]

## 2021-03-08 NOTE — Patient Instructions (Addendum)
We will get you set up with a CT of the abdomen and pelvis with contrast. We will call you about this.  We have referred you to Gastroenterology for a screening colonoscopy. They will call you to schedule this appointment.  We have referred you to GYN for an annual exam. They will call you to schedule this.  Follow up here in 4 weeks.    GENERAL POST-OPERATIVE PATIENT INSTRUCTIONS   WOUND CARE INSTRUCTIONS:  Keep a dry clean dressing on the wound if there is drainage. The initial bandage may be removed after 24 hours.  Once the wound has quit draining you may leave it open to air.  If clothing rubs against the wound or causes irritation and the wound is not draining you may cover it with a dry dressing during the daytime.  Try to keep the wound dry and avoid ointments on the wound unless directed to do so.  If the wound becomes bright red and painful or starts to drain infected material that is not clear, please contact your physician immediately.  If the wound is mildly pink and has a thick firm ridge underneath it, this is normal, and is referred to as a healing ridge.  This will resolve over the next 4-6 weeks.  BATHING: You may shower if you have been informed of this by your surgeon. However, Please do not submerge in a tub, hot tub, or pool until incisions are completely sealed or have been told by your surgeon that you may do so.  DIET:  You may eat any foods that you can tolerate.  It is a good idea to eat a high fiber diet and take in plenty of fluids to prevent constipation.  If you do become constipated you may want to take a mild laxative or take ducolax tablets on a daily basis until your bowel habits are regular.  Constipation can be very uncomfortable, along with straining, after recent surgery.  ACTIVITY:  You are encouraged to cough and deep breath or use your incentive spirometer if you were given one, every 15-30 minutes when awake.  This will help prevent respiratory  complications and low grade fevers post-operatively if you had a general anesthetic.  You may want to hug a pillow when coughing and sneezing to add additional support to the surgical area, if you had abdominal or chest surgery, which will decrease pain during these times.  You are encouraged to walk and engage in light activity for the next two weeks.  You should not lift more than 20 pounds for 6 weeks after surgery as it could put you at increased risk for complications.  Twenty pounds is roughly equivalent to a plastic bag of groceries. At that time- Listen to your body when lifting, if you have pain when lifting, stop and then try again in a few days. Soreness after doing exercises or activities of daily living is normal as you get back in to your normal routine.  MEDICATIONS:  Try to take narcotic medications and anti-inflammatory medications, such as tylenol, ibuprofen, naprosyn, etc., with food.  This will minimize stomach upset from the medication.  Should you develop nausea and vomiting from the pain medication, or develop a rash, please discontinue the medication and contact your physician.  You should not drive, make important decisions, or operate machinery when taking narcotic pain medication.  SUNBLOCK Use sun block to incision area over the next year if this area will be exposed to sun. This helps decrease  scarring and will allow you avoid a permanent darkened area over your incision.  QUESTIONS:  Please feel free to call our office if you have any questions, and we will be glad to assist you.

## 2021-03-08 NOTE — Progress Notes (Signed)
Outpatient Surgical Follow Up  03/08/2021  Gwendola Hornaday is an 70 y.o. female.   Chief Complaint  Patient presents with  . Routine Post Op    HPI: 70 year old female over 2 weeks status post cholecystectomy robotically by Dr. Hampton Abbot..  Did very well and has no concerns regarding recent surgery.  Pain  has completely subsided. Denies any fevers and chills any biliary obstruction. Now brings up to me that she has had chronic distention for over a year.  She states that she has gained some weight.  Last colonoscopy was over 12 years ago and she has never had a pelvic exam ever since she gave birth.  No fevers no chills no B type symptoms.  He did have a prior work-up to include upper quadrant ultrasound but no CT scan of the abdomen or other any other abdominal imaging studies.  CBC and CMP were normal.  She does have a history of mother with bladder cancer and father with lung cancer  Past Medical History:  Diagnosis Date  . Anxiety   . Aortic valve sclerosis   . Bilateral carotid bruits   . Carotid artery stenosis   . Hypertension   . Mitral regurgitation   . Mixed hyperlipidemia   . Shoulder pain   . Sleeping difficulties     Past Surgical History:  Procedure Laterality Date  . BREAST BIOPSY Left 08/13/2018   affirm bx, x clip, path pending  . CATARACT EXTRACTION Left    2017  . COLONOSCOPY  2012   Dr Candace Cruise  . LEFT HEART CATH AND CORONARY ANGIOGRAPHY N/A 06/25/2017   Procedure: LEFT HEART CATH AND CORONARY ANGIOGRAPHY;  Surgeon: [name], MD;  Location: [facility];  Service: Cardiovascular;  Laterality: N/A;    Family History  Problem Relation Age of Onset  . Cancer Mother        bladder cancer  . Heart disease Mother 57       Pacemaker  . Cancer Father        lung  . Multiple sclerosis Sister   . Valvular heart disease Brother 79       s/p bioprosthetic valve replacement at [facility]  . Breast cancer Neg Hx     Social History:  reports that she quit  smoking about 4 months ago. Her smoking use included cigarettes. She has a 15.00 pack-year smoking history. She has never used smokeless tobacco. She reports that she does not drink alcohol and does not use drugs.  Allergies:  Allergies  Allergen Reactions  . Bactrim [Sulfamethoxazole-Trimethoprim] Nausea And Vomiting  . Aleve [Naproxen Sodium] Swelling    Lips. But patient can take Ibuprofen without problems    Medications reviewed.    ROS Full ROS performed and is otherwise negative other than what is stated in HPI   BP (!) 142/64   Pulse 65   Temp 98.3 F (36.8 C)   Ht 5\' 7"  (1.702 m)   Wt 167 lb (75.8 kg)   SpO2 97%   BMI 26.16 kg/m   Physical Exam Vitals and nursing note reviewed. Exam conducted with a chaperone present.  Constitutional:      General: She is not in acute distress.    Appearance: Normal appearance.  Pulmonary:     Effort: Pulmonary effort is normal.     Breath sounds: No stridor.  Abdominal:     General: Abdomen is flat. There is distension.     Palpations: Abdomen is soft.  Tenderness: There is no abdominal tenderness. There is no guarding or rebound.     Hernia: No hernia is present.     Comments: Incisions clean dry and intact.  There is no peritonitis no tenderness.  There is significant increased in abdominal girth.  Difficult to assess for ascites  Musculoskeletal:        General: No swelling. Normal range of motion.  Skin:    General: Skin is warm and dry.     Capillary Refill: Capillary refill takes less than 2 seconds.  Neurological:     General: No focal deficit present.     Mental Status: She is alert and oriented to person, place, and time.  Psychiatric:        Mood and Affect: Mood normal.        Behavior: Behavior normal.        Thought Content: Thought content normal.        Judgment: Judgment normal.      Assessment/Plan: 70 year old female status postcholecystectomy.  She has no surgical issues at this time.  She  does have some chronic distention and increased girth of her abdominal wall.  I do think that she merits further work-up.  I will be concerned about a potential gynecological malignancy.  We will order CT scan of the abdomen and pelvis as well as referral to GI for upper and lower endoscopy.  She has not had a colonoscopy in over 12 years.  She has not had a pelvic exam since her last delivery we will make appropriate arrangements for GYN  Greater than 50% of the 30 minutes  visit was spent in counseling/coordination of care.  30 minutes were not related to her postoperative visit but rather to have chronic abdominal distention and increasing abdominal wall girth that has not been thoroughly evaluated   Caroleen Hamman, MD [place] [facility]

## 2021-03-10 ENCOUNTER — Other Ambulatory Visit: Payer: Self-pay | Admitting: Family Medicine

## 2021-03-10 DIAGNOSIS — J432 Centrilobular emphysema: Secondary | ICD-10-CM

## 2021-03-10 NOTE — Telephone Encounter (Signed)
Requested medications are due for refill today No, was filled 03/02/21  Requested medications are on the active medication list yes  Last refill 03/02/21  Last visit 03/08/21  Future visit scheduled 09/11/21  Notes to clinic Did not want to refuse an inhaler, however, is asking too early, inhaler should last one month, please assess.

## 2021-03-13 ENCOUNTER — Telehealth: Payer: Self-pay | Admitting: Internal Medicine

## 2021-03-13 ENCOUNTER — Other Ambulatory Visit: Payer: Self-pay

## 2021-03-13 ENCOUNTER — Ambulatory Visit (INDEPENDENT_AMBULATORY_CARE_PROVIDER_SITE_OTHER): Payer: Medicare HMO | Admitting: Internal Medicine

## 2021-03-13 ENCOUNTER — Encounter: Payer: Self-pay | Admitting: Internal Medicine

## 2021-03-13 VITALS — BP 120/61 | HR 76 | Ht 67.0 in | Wt 169.6 lb

## 2021-03-13 DIAGNOSIS — E785 Hyperlipidemia, unspecified: Secondary | ICD-10-CM

## 2021-03-13 DIAGNOSIS — I6523 Occlusion and stenosis of bilateral carotid arteries: Secondary | ICD-10-CM | POA: Diagnosis not present

## 2021-03-13 DIAGNOSIS — T466X5A Adverse effect of antihyperlipidemic and antiarteriosclerotic drugs, initial encounter: Secondary | ICD-10-CM

## 2021-03-13 DIAGNOSIS — T466X5D Adverse effect of antihyperlipidemic and antiarteriosclerotic drugs, subsequent encounter: Secondary | ICD-10-CM

## 2021-03-13 DIAGNOSIS — M791 Myalgia, unspecified site: Secondary | ICD-10-CM | POA: Diagnosis not present

## 2021-03-13 DIAGNOSIS — I25119 Atherosclerotic heart disease of native coronary artery with unspecified angina pectoris: Secondary | ICD-10-CM

## 2021-03-13 MED ORDER — REPATHA SURECLICK 140 MG/ML ~~LOC~~ SOAJ: 1.0000 | SUBCUTANEOUS | 11 refills | Status: DC

## 2021-03-13 NOTE — Telephone Encounter (Signed)
PA for repatha submitted via CMM (Key: [id number])

## 2021-03-13 NOTE — Patient Instructions (Signed)
Medication Instructions:  Dr. Debara Pickett recommends Repatha 140mg /mL (PCSK9). This is an injectable cholesterol medication self-administered once every 14 days. This medication will likely need prior approval with your insurance company, which we will work on. If the medication is not approved initially, we may need to do an appeal with your insurance.   Administer medication in area of fatty tissue such as abdomen, outer thigh, back of upper arm - and rotate site with each injection Store medication in refrigerator until ready to administer - allow to sit at room temp for 30 mins - 1 hour prior to injection Dispose of medication in a SHARPS container - your pharmacy should be able to direct you on this and proper disposal   If you need a co-pay card for Repatha: [email or web address]-[email or web address] >> paying for Repatha or red box that says "[place]" in top right If you need a co-pay card for Praluent: WedMap.it >> starting & paying for Praluent  Patient Assistance:  The [employer] foundation offers assistance to help pay for medication copays.  They will cover copays for all cholesterol lowering meds, including statins, fibrates, omega-3 fish oils like Vascepa, ezetimibe, Repatha, Praluent, Nexletol, Nexlizet.  The cards are usually good for $2,500 or 12 months, whichever comes first. 1. Go to healthwellfoundation.org 2. Click on "Apply Now" 3. Answer questions as to whom is applying (patient or representative) 4. Your disease fund will be "hypercholesterolemia - Medicare access" 5. They will ask questions about finances and which medications you are taking for cholesterol 6. When you submit, the approval is usually within minutes.  You will need to print the card information from the site 7. You will need to show this information to your pharmacy, they will bill your Medicare Part D plan first -then bill [employer] --for the copay.   You can also call them at [phone], although the hold times can be quite  long.   *If you need a refill on your cardiac medications before your next appointment, please call your pharmacy*   Lab Work: FASTING lab work in 3-4 months to check cholesterol  * complete about 1 week before your next visit with Dr. Debara Pickett   If you have labs (blood work) drawn today and your tests are completely normal, you will receive your results only by: Marland Kitchen MyChart Message (if you have MyChart) OR . A paper copy in the mail If you have any lab test that is abnormal or we need to change your treatment, we will call you to review the results.   Testing/Procedures: NONE   Follow-Up: At [facility], you and your health needs are our priority.  As part of our continuing mission to provide you with exceptional heart care, we have created designated Provider Care Teams.  These Care Teams include your primary Cardiologist (physician) and Advanced Practice Providers (APPs -  Physician Assistants and Nurse Practitioners) who all work together to provide you with the care you need, when you need it.  We recommend signing up for the patient portal called "MyChart".  Sign up information is provided on this After Visit Summary.  MyChart is used to connect with patients for Virtual Visits (Telemedicine).  Patients are able to view lab/test results, encounter notes, upcoming appointments, etc.  Non-urgent messages can be sent to your provider as well.   To learn more about what you can do with MyChart, go to [email or web address].    Your next appointment:   3-4 month(s) - lipid clinic  The  format for your next appointment:   In Person  Provider:   K. Mali Hilty, MD   Other Instructions

## 2021-03-13 NOTE — Progress Notes (Signed)
LIPID CLINIC CONSULT NOTE  Chief Complaint:  Follow-up dyslipidemia  Primary Care Physician: Olin Hauser, DO  Primary Cardiologist:  [name], MD  HPI:  Kristin Parker is a 70 y.o. female who is being seen today for the evaluation of dyslipidemia at the request of Nobie Putnam *.  Kristin Parker is a pleasant 70 year old female kindly referred by Laurann Montana, NP on behalf of Dr. Saunders Revel tree of coronary artery disease.  She had cardiac catheterization which showed mild to moderate nonobstructive coronary disease in 2018 and a recent CT coronary angiogram showing some similar disease including some distal left main disease.  She also has PAD and significant aortic atherosclerosis.  She denies a family history of high cholesterol or early onset heart disease however more recently lipid profile showed significant dyslipidemia with a total cholesterol 305, HDL 45, LDL 207 and triglycerides 314.  Hemoglobin A1c is normal in a nondiabetic range.  Thyroid function was also normal.  Unfortunately, she is not been able to tolerate high potency rosuvastatin, atorvastatin and simvastatin in the past and then was switched to ezetimibe 10 mg daily.  She has been on that for a while and her lipids in November as referenced above included being on that, therefore her cholesterol is likely about 25% higher.  Her daughter was here today and we discussed the patient's diet as well indicating that there probably is a significant amount of saturated fat intake including a lot of processed and preprepared foods.  03/13/2021  Kristin Parker returns today for follow-up.  She has had a marked reduction in her cholesterol however was not because of the statin.  She did try low-dose rosuvastatin however about 2 weeks later she developed some cramping and drawing up of her hands.  She discontinued that and the symptoms went away.  Lipids however are lower now with total cholesterol of 248 (down from  305).  LDL cholesterol is reduced significantly as well from 207 down to 167.  Unfortunately she remains above target and is on no lipid-lowering therapy at this point.  Her goal LDL is less than 70.  We discussed the possibility of a PCSK9 inhibitor today and she is agreeable to that.  She had previously failed 3 other statins and ezetimibe all causing similar side effects of both high and low doses.  PMHx:  Past Medical History:  Diagnosis Date  . Anxiety   . Aortic valve sclerosis   . Bilateral carotid bruits   . Carotid artery stenosis   . Hypertension   . Mitral regurgitation   . Mixed hyperlipidemia   . Shoulder pain   . Sleeping difficulties     Past Surgical History:  Procedure Laterality Date  . BREAST BIOPSY Left 08/13/2018   affirm bx, x clip, path pending  . CATARACT EXTRACTION Left    2017  . COLONOSCOPY  2012   Dr Candace Cruise  . LEFT HEART CATH AND CORONARY ANGIOGRAPHY N/A 06/25/2017   Procedure: LEFT HEART CATH AND CORONARY ANGIOGRAPHY;  Surgeon: [name], MD;  Location: [facility];  Service: Cardiovascular;  Laterality: N/A;    FAMHx:  Family History  Problem Relation Age of Onset  . Cancer Mother        bladder cancer  . Heart disease Mother 71       Pacemaker  . Cancer Father        lung  . Multiple sclerosis Sister   . Valvular heart disease Brother 34  s/p bioprosthetic valve replacement at [facility]  . Breast cancer Neg Hx     SOCHx:   reports that she quit smoking about 4 months ago. Her smoking use included cigarettes. She has a 15.00 pack-year smoking history. She has never used smokeless tobacco. She reports that she does not drink alcohol and does not use drugs.  ALLERGIES:  Allergies  Allergen Reactions  . Bactrim [Sulfamethoxazole-Trimethoprim] Nausea And Vomiting  . Aleve [Naproxen Sodium] Swelling    Lips. But patient can take Ibuprofen without problems    ROS: Pertinent items noted in HPI and remainder of comprehensive ROS  otherwise negative.  HOME MEDS: Current Outpatient Medications on File Prior to Visit  Medication Sig Dispense Refill  . acetaminophen (TYLENOL) 500 MG tablet Take 2 tablets (1,000 mg total) by mouth every 6 (six) hours as needed for mild pain.    Marland Kitchen albuterol (VENTOLIN HFA) 108 (90 Base) MCG/ACT inhaler INHALE 2 PUFFS EVERY 4 HOURS FOR WHEEZING OR SHORTNESS OF BREATH AS NEEDED 18 g 1  . amLODipine (NORVASC) 10 MG tablet Take 1 tablet (10 mg total) by mouth daily. 90 tablet 3  . aspirin EC 81 MG tablet Take 1 tablet (81 mg total) by mouth daily.    . fluticasone (FLONASE) 50 MCG/ACT nasal spray USE 2 SPRAYS INTO EACH NOSTRIL ONCE DAILY 16 g 5  . hydrOXYzine (ATARAX/VISTARIL) 10 MG tablet Take 1 tablet (10 mg total) by mouth at bedtime as needed. 90 tablet 3  . ibuprofen (ADVIL) 600 MG tablet Take 1 tablet (600 mg total) by mouth every 8 (eight) hours as needed for moderate pain. 60 tablet 1  . isosorbide mononitrate (IMDUR) 60 MG 24 hr tablet TAKE 1 TABLET BY MOUTH ONCE DAILY 90 tablet 1  . losartan (COZAAR) 100 MG tablet Take 1 tablet (100 mg total) by mouth daily. 90 tablet 3  . meloxicam (MOBIC) 15 MG tablet TAKE 1 TABLET BY MOUTH ONCE DAILY AS NEEDED FOR PAIN WITH FOOD 30 tablet 5  . Omega-3 Fatty Acids (FISH OIL) 1000 MG CAPS Take 1 capsule (1,000 mg total) by mouth 2 (two) times daily. 60 capsule 0  . sertraline (ZOLOFT) 100 MG tablet Take 1 tablet (100 mg total) by mouth daily. 90 tablet 3  . SPIRIVA HANDIHALER 18 MCG inhalation capsule INHALE CONTENTS OF 1 CAPSULE ONCE DAILY 30 capsule 11   No current facility-administered medications on file prior to visit.    LABS/IMAGING: No results found for this or any previous visit (from the past 48 hour(s)). No results found.  LIPID PANEL:    Component Value Date/Time   CHOL 248 (H) 03/01/2021 0821   CHOL 135 07/22/2019 1034   TRIG 319 (H) 03/01/2021 0821   HDL 32 (L) 03/01/2021 0821   HDL 43 07/22/2019 1034   CHOLHDL 7.8 (H)  03/01/2021 0821   VLDL NOT CALCULATED 05/28/2017 1008   LDLCALC 167 (H) 03/01/2021 0821   LDLDIRECT 62 05/28/2017 1008    WEIGHTS: Wt Readings from Last 3 Encounters:  03/13/21 169 lb 9.6 oz (76.9 kg)  03/08/21 168 lb (76.2 kg)  03/08/21 167 lb (75.8 kg)    VITALS: BP 120/61   Pulse 76   Ht 5\' 7"  (1.702 m)   Wt 169 lb 9.6 oz (76.9 kg)   SpO2 92%   BMI 26.56 kg/m   EXAM: Deferred   EKG: Deferred  ASSESSMENT: 1. Mixed dyslipidemia, goal LDL <70 2. Mild to moderate non-obstructive CAD 3. PAD 4. Atherogenic diet  PLAN: 1.   Kristin Parker has already had some significant improvement in her cholesterol however not with medication.  Unfortunately she cannot tolerate rosuvastatin.  She is also failed 3 other statins both at high and low doses and ezetimibe.  Given her history of coronary disease and PAD, I think she benefit from a PCSK9 inhibitor.  Currently her LDL cholesterol is 167 with a target to less than 70.  We discussed starting Repatha today and she is agreeable to that.  Plan 140 mg every 2 weeks.  We will get prior authorization and repeat lipids in about 3 to 4 months with a follow-up at that time.  Pixie Casino, MD, [facility], [place] Director of the Advanced Lipid Disorders &  Cardiovascular Risk Reduction Clinic Diplomate of the American Board of Clinical Lipidology Attending Cardiologist  Direct Dial: [phone]  Fax: [phone]  Website:  [email or web address]  Nadean Corwin Daiton Cowles 03/13/2021, 2:12 PM

## 2021-03-14 NOTE — Telephone Encounter (Signed)
Left message on name-verified VM that med is approved and to call back if she has questions/concerns

## 2021-03-14 NOTE — Telephone Encounter (Signed)
PA Case: [id number], Status: Approved, Coverage Starts on: 03/13/2021 12:00:00 AM, Coverage Ends on: 09/09/2021 12:00:00 AM

## 2021-03-15 ENCOUNTER — Encounter: Payer: Self-pay | Admitting: Surgery

## 2021-03-22 ENCOUNTER — Other Ambulatory Visit: Payer: Self-pay

## 2021-03-22 ENCOUNTER — Ambulatory Visit (INDEPENDENT_AMBULATORY_CARE_PROVIDER_SITE_OTHER): Payer: Medicare HMO | Admitting: Certified Nurse Midwife

## 2021-03-22 ENCOUNTER — Encounter: Payer: Self-pay | Admitting: Certified Nurse Midwife

## 2021-03-22 VITALS — BP 135/72 | HR 83 | Resp 16 | Ht 65.0 in | Wt 169.8 lb

## 2021-03-22 DIAGNOSIS — Z124 Encounter for screening for malignant neoplasm of cervix: Secondary | ICD-10-CM

## 2021-03-22 DIAGNOSIS — Z01419 Encounter for gynecological examination (general) (routine) without abnormal findings: Secondary | ICD-10-CM | POA: Diagnosis not present

## 2021-03-22 DIAGNOSIS — Z1231 Encounter for screening mammogram for malignant neoplasm of breast: Secondary | ICD-10-CM

## 2021-03-22 DIAGNOSIS — R1084 Generalized abdominal pain: Secondary | ICD-10-CM

## 2021-03-22 DIAGNOSIS — Z9189 Other specified personal risk factors, not elsewhere classified: Secondary | ICD-10-CM | POA: Diagnosis not present

## 2021-03-22 DIAGNOSIS — R140 Abdominal distension (gaseous): Secondary | ICD-10-CM

## 2021-03-22 NOTE — Progress Notes (Signed)
GYNECOLOGY ANNUAL PREVENTATIVE CARE ENCOUNTER NOTE  History:     Kristin Parker is a 70 y.o. G3P3 female here for a routine annual gynecologic exam.  Current complaints: referred due to abdominal distention. Pt state she is not sure why she is here and that she has no complaints.She has CT scan for abdominal distension.   Denies abnormal vaginal bleeding, discharge, pelvic pain, problems with intercourse or other gynecologic concerns.     Social Relationship: single Living: alone Work: n/a  Exercise: none Smoke/Alcohol/drug use: use to smoke stopped in January this yr.Deneis alcohol and drug use.   Gynecologic History No LMP recorded. Patient is postmenopausal. Contraception: post menopausal status Last Pap: n/a  Last mammogram: 2019. Results were: fibronadenoma  Obstetric History OB History  Gravida Para Term Preterm AB Living  3 3          SAB IAB Ectopic Multiple Live Births               # Outcome Date GA Lbr Len/2nd Weight Sex Delivery Anes PTL Lv  3 Para           2 Para           1 Para             Obstetric Comments  1st Menstrual Cycle:  12   1st Pregnancy:  19    Past Medical History:  Diagnosis Date  . Anxiety   . Aortic valve sclerosis   . Bilateral carotid bruits   . Carotid artery stenosis   . Hypertension   . Mitral regurgitation   . Mixed hyperlipidemia   . Shoulder pain   . Sleeping difficulties     Past Surgical History:  Procedure Laterality Date  . BREAST BIOPSY Left 08/13/2018   affirm bx, x clip, path pending  . CATARACT EXTRACTION Left    2017  . CHOLECYSTECTOMY    . COLONOSCOPY  2012   Dr Candace Cruise  . LEFT HEART CATH AND CORONARY ANGIOGRAPHY N/A 06/25/2017   Procedure: LEFT HEART CATH AND CORONARY ANGIOGRAPHY;  Surgeon: [name], MD;  Location: [facility];  Service: Cardiovascular;  Laterality: N/A;    Current Outpatient Medications on File Prior to Visit  Medication Sig Dispense Refill  . acetaminophen  (TYLENOL) 500 MG tablet Take 2 tablets (1,000 mg total) by mouth every 6 (six) hours as needed for mild pain.    Marland Kitchen albuterol (VENTOLIN HFA) 108 (90 Base) MCG/ACT inhaler INHALE 2 PUFFS EVERY 4 HOURS FOR WHEEZING OR SHORTNESS OF BREATH AS NEEDED 18 g 1  . amLODipine (NORVASC) 10 MG tablet Take 1 tablet (10 mg total) by mouth daily. 90 tablet 3  . aspirin EC 81 MG tablet Take 1 tablet (81 mg total) by mouth daily.    . Evolocumab (REPATHA SURECLICK) 631 MG/ML SOAJ Inject 1 Dose into the skin every 14 (fourteen) days. 2 mL 11  . fluticasone (FLONASE) 50 MCG/ACT nasal spray USE 2 SPRAYS INTO EACH NOSTRIL ONCE DAILY 16 g 5  . hydrOXYzine (ATARAX/VISTARIL) 10 MG tablet Take 1 tablet (10 mg total) by mouth at bedtime as needed. 90 tablet 3  . ibuprofen (ADVIL) 600 MG tablet Take 1 tablet (600 mg total) by mouth every 8 (eight) hours as needed for moderate pain. 60 tablet 1  . isosorbide mononitrate (IMDUR) 60 MG 24 hr tablet TAKE 1 TABLET BY MOUTH ONCE DAILY 90 tablet 1  . losartan (COZAAR) 100 MG tablet Take 1  tablet (100 mg total) by mouth daily. 90 tablet 3  . meloxicam (MOBIC) 15 MG tablet TAKE 1 TABLET BY MOUTH ONCE DAILY AS NEEDED FOR PAIN WITH FOOD 30 tablet 5  . Omega-3 Fatty Acids (FISH OIL) 1000 MG CAPS Take 1 capsule (1,000 mg total) by mouth 2 (two) times daily. 60 capsule 0  . sertraline (ZOLOFT) 100 MG tablet Take 1 tablet (100 mg total) by mouth daily. 90 tablet 3  . SPIRIVA HANDIHALER 18 MCG inhalation capsule INHALE CONTENTS OF 1 CAPSULE ONCE DAILY 30 capsule 11   No current facility-administered medications on file prior to visit.    Allergies  Allergen Reactions  . Bactrim [Sulfamethoxazole-Trimethoprim] Nausea And Vomiting  . Aleve [Naproxen Sodium] Swelling    Lips. But patient can take Ibuprofen without problems  . Atorvastatin     myalgias  . Crestor [Rosuvastatin]     5mg , 20mg  - myalgias  . Simvastatin     myalgias  . Zetia [Ezetimibe]     myalgias    Social  History:  reports that she quit smoking about 4 months ago. Her smoking use included cigarettes. She has a 15.00 pack-year smoking history. She has never used smokeless tobacco. She reports that she does not drink alcohol and does not use drugs.  Family History  Problem Relation Age of Onset  . Cancer Mother        bladder cancer  . Heart disease Mother 71       Pacemaker  . Cancer Father        lung  . Multiple sclerosis Sister   . Valvular heart disease Brother 32       s/p bioprosthetic valve replacement at [facility]  . Breast cancer Neg Hx     The following portions of the patient's history were reviewed and updated as appropriate: allergies, current medications, past family history, past medical history, past social history, past surgical history and problem list.  Review of Systems Pertinent items noted in HPI and remainder of comprehensive ROS otherwise negative.  Physical Exam:  BP 135/72   Pulse 83   Resp 16   Ht 5\' 5"  (1.651 m)   Wt 169 lb 12.8 oz (77 kg)   BMI 28.26 kg/m  CONSTITUTIONAL: Well-developed, well-nourished female in no acute distress.  HENT:  Normocephalic, atraumatic, External right and left ear normal. Oropharynx is clear and moist EYES: Conjunctivae and EOM are normal. Pupils are equal, round, and reactive to light. No scleral icterus.  NECK: Normal range of motion, supple, no masses.  Normal thyroid.  SKIN: Skin is warm and dry. No rash noted. Not diaphoretic. No erythema. No pallor. MUSCULOSKELETAL: Normal range of motion. No tenderness.  No cyanosis, clubbing, or edema.  2+ distal pulses. NEUROLOGIC: Alert and oriented to person, place, and time. Normal reflexes, muscle tone coordination.  PSYCHIATRIC: Normal mood and affect. Normal behavior. Normal judgment and thought content. CARDIOVASCULAR: Normal heart rate noted, regular rhythm RESPIRATORY: Clear to auscultation bilaterally. Effort and breath sounds normal, no problems with respiration  noted. BREASTS: Symmetric in size. No masses, tenderness, skin changes, nipple drainage, or lymphadenopathy bilaterally.  ABDOMEN: Soft, no distention noted.  No tenderness, rebound or guarding. Distended , mild tenderness to palpation. No signs of hernia. PELVIC: Normal appearing external genitalia and urethral meatus; normal appearing vaginal mucosa and cervix.  No abnormal discharge noted.  Pap smear not indicated.  uterine size- difficult to assess due to abdominal distension, no other palpable masses, no uterine or adnexal  tenderness.  .   Assessment and Plan:  Annual [facility] Women GYN exam  2. Encounter for screening mammogram for malignant neoplasm of breast  - MM 3D SCREEN BREAST BILATERAL; Future  3. At risk for bone density loss  - DG Bone Density; Future . Pap: n/a  Mammogram : ordered Labs: none  Refills: none Referral: bone density scan ordered. Routine preventative health maintenance measures emphasized. Please refer to After Visit Summary for other counseling recommendations.     Discussed with pt that her uterus was difficult to appreciate due to distention. She denies any pain or issues and declines u/s at this time. PT state she will wait until she has CT scan .  Philip Aspen, CNM [facility]

## 2021-03-22 NOTE — Patient Instructions (Signed)
Exercising to Stay Healthy To become healthy and stay healthy, it is recommended that you do moderate-intensity and vigorous-intensity exercise. You can tell that you are exercising at a moderate intensity if your heart starts beating faster and you start breathing faster but can still hold a conversation. You can tell that you are exercising at a vigorous intensity if you are breathing much harder and faster and cannot hold a conversation while exercising. Exercising regularly is important. It has many health benefits, such as:  Improving overall fitness, flexibility, and endurance.  Increasing bone density.  Helping with weight control.  Decreasing body fat.  Increasing muscle strength.  Reducing stress and tension.  Improving overall health. How often should I exercise? Choose an activity that you enjoy, and set realistic goals. Your health care provider can help you make an activity plan that works for you. Exercise regularly as told by your health care provider. This may include:  Doing strength training two times a week, such as: ? Lifting weights. ? Using resistance bands. ? Push-ups. ? Sit-ups. ? Yoga.  Doing a certain intensity of exercise for a given amount of time. Choose from these options: ? A total of 150 minutes of moderate-intensity exercise every week. ? A total of 75 minutes of vigorous-intensity exercise every week. ? A mix of moderate-intensity and vigorous-intensity exercise every week. Children, pregnant women, people who have not exercised regularly, people who are overweight, and older adults may need to talk with a health care provider about what activities are safe to do. If you have a medical condition, be sure to talk with your health care provider before you start a new exercise program. What are some exercise ideas? Moderate-intensity exercise ideas include:  Walking 1 mile (1.6 km) in about 15  minutes.  Biking.  Hiking.  Golfing.  Dancing.  Water aerobics. Vigorous-intensity exercise ideas include:  Walking 4.5 miles (7.2 km) or more in about 1 hour.  Jogging or running 5 miles (8 km) in about 1 hour.  Biking 10 miles (16.1 km) or more in about 1 hour.  Lap swimming.  Roller-skating or in-line skating.  Cross-country skiing.  Vigorous competitive sports, such as football, basketball, and soccer.  Jumping rope.  Aerobic dancing.   What are some everyday activities that can help me to get exercise?  Yard work, such as: ? Pushing a lawn mower. ? Raking and bagging leaves.  Washing your car.  Pushing a stroller.  Shoveling snow.  Gardening.  Washing windows or floors. How can I be more active in my day-to-day activities?  Use stairs instead of an elevator.  Take a walk during your lunch break.  If you drive, park your car farther away from your work or school.  If you take public transportation, get off one stop early and walk the rest of the way.  Stand up or walk around during all of your indoor phone calls.  Get up, stretch, and walk around every 30 minutes throughout the day.  Enjoy exercise with a friend. Support to continue exercising will help you keep a regular routine of activity. What guidelines can I follow while exercising?  Before you start a new exercise program, talk with your health care provider.  Do not exercise so much that you hurt yourself, feel dizzy, or get very short of breath.  Wear comfortable clothes and wear shoes with good support.  Drink plenty of water while you exercise to prevent dehydration or heat stroke.  Work out until   your breathing and your heartbeat get faster. Where to find more information  U.S. Department of Health and Human Services: www.hhs.gov  Centers for Disease Control and Prevention (CDC): www.cdc.gov Summary  Exercising regularly is important. It will improve your overall fitness,  flexibility, and endurance.  Regular exercise also will improve your overall health. It can help you control your weight, reduce stress, and improve your bone density.  Do not exercise so much that you hurt yourself, feel dizzy, or get very short of breath.  Before you start a new exercise program, talk with your health care provider. This information is not intended to replace advice given to you by your health care provider. Make sure you discuss any questions you have with your health care provider. Document Revised: 09/27/2017 Document Reviewed: 09/05/2017 Elsevier Patient Education  2021 Elsevier Inc.  

## 2021-03-23 ENCOUNTER — Telehealth: Payer: Self-pay

## 2021-03-23 NOTE — Telephone Encounter (Signed)
Left the patient a message to call back to speak with me about scheduling her CT scan.

## 2021-03-29 NOTE — Telephone Encounter (Signed)
Patient has been scheduled for a CT abdomen/pelvis without contrast at [facility] on 04/05/2021 at 9:30 am. Prep: NPO 4 hours prior. Need to pick up prep kit. Patient verbalizes understanding. She will follow up in office after the results are back.

## 2021-04-04 ENCOUNTER — Other Ambulatory Visit: Payer: Self-pay

## 2021-04-04 ENCOUNTER — Ambulatory Visit
Admission: RE | Admit: 2021-04-04 | Discharge: 2021-04-04 | Disposition: A | Discharge status: Home / Self Care | Payer: Medicare HMO | Source: Ambulatory Visit | Attending: Surgery | Admitting: Surgery

## 2021-04-04 DIAGNOSIS — R1084 Generalized abdominal pain: Secondary | ICD-10-CM

## 2021-04-04 DIAGNOSIS — R140 Abdominal distension (gaseous): Secondary | ICD-10-CM

## 2021-04-04 DIAGNOSIS — M47819 Spondylosis without myelopathy or radiculopathy, site unspecified: Secondary | ICD-10-CM | POA: Diagnosis not present

## 2021-04-04 DIAGNOSIS — K753 Granulomatous hepatitis, not elsewhere classified: Secondary | ICD-10-CM | POA: Diagnosis not present

## 2021-04-04 DIAGNOSIS — K760 Fatty (change of) liver, not elsewhere classified: Secondary | ICD-10-CM | POA: Diagnosis not present

## 2021-04-05 ENCOUNTER — Ambulatory Visit: Admission: RE | Admit: 2021-04-05 | Payer: Medicare HMO | Source: Ambulatory Visit

## 2021-04-05 ENCOUNTER — Encounter: Payer: Medicare HMO | Admitting: Surgery

## 2021-04-10 ENCOUNTER — Other Ambulatory Visit: Payer: Self-pay

## 2021-04-10 ENCOUNTER — Encounter: Payer: Self-pay | Admitting: Surgery

## 2021-04-10 ENCOUNTER — Ambulatory Visit (INDEPENDENT_AMBULATORY_CARE_PROVIDER_SITE_OTHER): Payer: Medicare HMO | Admitting: Surgery

## 2021-04-10 VITALS — BP 134/71 | HR 75 | Temp 98.3°F | Ht 65.0 in | Wt 168.0 lb

## 2021-04-10 DIAGNOSIS — R140 Abdominal distension (gaseous): Secondary | ICD-10-CM | POA: Diagnosis not present

## 2021-04-10 NOTE — Patient Instructions (Addendum)
You may want to speak with your primary care provider about weight loss.   Follow-up with our office as needed.  Please call and ask to speak with a nurse if you develop questions or concerns.   GENERAL POST-OPERATIVE PATIENT INSTRUCTIONS   WOUND CARE INSTRUCTIONS: Try to keep the wound dry and avoid ointments on the wound unless directed to do so.  If the wound becomes bright red and painful or starts to drain infected material that is not clear, please contact your physician immediately.  If the wound is mildly pink and has a thick firm ridge underneath it, this is normal, and is referred to as a healing ridge.  This will resolve over the next 4-6 weeks.  BATHING: You may shower if you have been informed of this by your surgeon. However, Please do not submerge in a tub, hot tub, or pool until incisions are completely sealed or have been told by your surgeon that you may do so.  DIET:  You may eat any foods that you can tolerate.  It is a good idea to eat a high fiber diet and take in plenty of fluids to prevent constipation.  If you do become constipated you may want to take a mild laxative or take ducolax tablets on a daily basis until your bowel habits are regular.  Constipation can be very uncomfortable, along with straining, after recent surgery.  ACTIVITY: You are encouraged to walk and engage in light activity for the next two weeks.  You should not lift more than 20 pounds for 6 weeks after surgery as it could put you at increased risk for complications.  Twenty pounds is roughly equivalent to a plastic bag of groceries. At that time- Listen to your body when lifting, if you have pain when lifting, stop and then try again in a few days. Soreness after doing exercises or activities of daily living is normal as you get back in to your normal routine.  MEDICATIONS:  Try to take narcotic medications and anti-inflammatory medications, such as tylenol, ibuprofen, naprosyn, etc., with food.   This will minimize stomach upset from the medication.  Should you develop nausea and vomiting from the pain medication, or develop a rash, please discontinue the medication and contact your physician.  You should not drive, make important decisions, or operate machinery when taking narcotic pain medication.  SUNBLOCK Use sun block to incision area over the next year if this area will be exposed to sun. This helps decrease scarring and will allow you avoid a permanent darkened area over your incision.  QUESTIONS:  Please feel free to call our office if you have any questions, and we will be glad to assist you.

## 2021-04-10 NOTE — Progress Notes (Signed)
Outpatient Surgical Follow Up  04/10/2021  Kristin Parker is an 70 y.o. female.   Chief Complaint  Patient presents with   Routine Post Op    HPI: Kristin Parker is a 70 year old female who had her cholecystectomy on February 18, 2021.  She is followed after further work-up for abdominal distention and increasing abdominal girth.  I have personally reviewed the CT scan showing no evidence of any mechanical lesions.  No evidence of bowel obstruction and no complications related to her gallbladder surgery.  She is taking p.o.  No fevers no chills no diarrhea.  No biliary obstruction.  Past Medical History:  Diagnosis Date   Anxiety    Aortic valve sclerosis    Bilateral carotid bruits    Carotid artery stenosis    Hypertension    Mitral regurgitation    Mixed hyperlipidemia    Shoulder pain    Sleeping difficulties     Past Surgical History:  Procedure Laterality Date   BREAST BIOPSY Left 08/13/2018   affirm bx, x clip, path pending   CATARACT EXTRACTION Left    2017   CHOLECYSTECTOMY     COLONOSCOPY  2012   Dr Candace Cruise   LEFT HEART CATH AND CORONARY ANGIOGRAPHY N/A 06/25/2017   Procedure: LEFT HEART CATH AND CORONARY ANGIOGRAPHY;  Surgeon: [name], MD;  Location: [facility];  Service: Cardiovascular;  Laterality: N/A;    Family History  Problem Relation Age of Onset   Cancer Mother        bladder cancer   Heart disease Mother 9       Pacemaker   Cancer Father        lung   Multiple sclerosis Sister    Valvular heart disease Brother 47       s/p bioprosthetic valve replacement at [facility]   Breast cancer Neg Hx     Social History:  reports that she quit smoking about 5 months ago. Her smoking use included cigarettes. She has a 15.00 pack-year smoking history. She has never used smokeless tobacco. She reports that she does not drink alcohol and does not use drugs.  Allergies:  Allergies  Allergen Reactions   Bactrim [Sulfamethoxazole-Trimethoprim] Nausea And  Vomiting   Aleve [Naproxen Sodium] Swelling    Lips. But patient can take Ibuprofen without problems   Atorvastatin     myalgias   Crestor [Rosuvastatin]     5mg , 20mg  - myalgias   Simvastatin     myalgias   Zetia [Ezetimibe]     myalgias    Medications reviewed.    ROS Full ROS performed and is otherwise negative other than what is stated in HPI   BP 134/71   Pulse 75   Temp 98.3 F (36.8 C)   Ht 5\' 5"  (1.651 m)   Wt 168 lb (76.2 kg)   SpO2 91%   BMI 27.96 kg/m   Physical Exam Vitals and nursing note reviewed. Exam conducted with a chaperone present.  Constitutional:      General: She is not in acute distress.    Appearance: Normal appearance. She is not toxic-appearing.  Eyes:     General: No scleral icterus.       Right eye: No discharge.        Left eye: No discharge.  Cardiovascular:     Rate and Rhythm: Normal rate and regular rhythm.  Pulmonary:     Effort: Pulmonary effort is normal.     Breath sounds: Normal breath  sounds. No stridor. No wheezing.  Abdominal:     General: There is no distension.     Palpations: Abdomen is soft. There is no mass.     Tenderness: There is no abdominal tenderness. There is no guarding or rebound.     Hernia: No hernia is present.     Comments: Lap scars w/o hernias or complicaitons  Musculoskeletal:        General: No swelling or tenderness. Normal range of motion.     Cervical back: No rigidity or tenderness.  Skin:    General: Skin is warm and dry.     Capillary Refill: Capillary refill takes less than 2 seconds.  Neurological:     General: No focal deficit present.     Mental Status: She is alert and oriented to person, place, and time.  Psychiatric:        Mood and Affect: Mood normal.        Behavior: Behavior normal.        Thought Content: Thought content normal.        Judgment: Judgment normal.        Assessment/Plan:  1. Abdominal distension without evidence of definitive mechanical obstruction  or any other pathology that will explain increasing abdominal girth.  Continue healthy management and exams and procedures to include upcoming colonoscopy.  No need for surgical intervention at this time. Please note that sxs are unrelated to [place] surgery and visit is unrelated to post op visit.   Greater than 50% of the 30 minutes  visit was spent in counseling/coordination of care   Caroleen Hamman, MD [facility]

## 2021-05-03 ENCOUNTER — Telehealth (INDEPENDENT_AMBULATORY_CARE_PROVIDER_SITE_OTHER): Payer: Medicare HMO | Admitting: Gastroenterology

## 2021-05-03 DIAGNOSIS — Z86010 Personal history of colonic polyps: Secondary | ICD-10-CM

## 2021-05-03 MED ORDER — NA SULFATE-K SULFATE-MG SULF 17.5-3.13-1.6 GM/177ML PO SOLN: 1.0000 | Freq: Once | ORAL | 0 refills | Status: AC

## 2021-05-03 NOTE — Progress Notes (Signed)
Gastroenterology Pre-Procedure Review  Request Date: 05/15/21 Requesting Physician: Dr. Vicente Males  PATIENT REVIEW QUESTIONS: The patient responded to the following health history questions as indicated:    1. Are you having any GI issues? no 2. Do you have a personal history of Polyps? yes (04/05/2011) 3. Do you have a family history of Colon Cancer or Polyps? no 4. Diabetes Mellitus? no 5. Joint replacements in the past 12 months?no 6. Major health problems in the past 3 months?no 7. Any artificial heart valves, MVP, or defibrillator?no    MEDICATIONS & ALLERGIES:    Patient reports the following regarding taking any anticoagulation/antiplatelet therapy:   Plavix, Coumadin, Eliquis, Xarelto, Lovenox, Pradaxa, Brilinta, or Effient? no Aspirin? yes (81 mg)  Patient confirms/reports the following medications:  Current Outpatient Medications  Medication Sig Dispense Refill   acetaminophen (TYLENOL) 500 MG tablet Take 2 tablets (1,000 mg total) by mouth every 6 (six) hours as needed for mild pain.     albuterol (VENTOLIN HFA) 108 (90 Base) MCG/ACT inhaler INHALE 2 PUFFS EVERY 4 HOURS FOR WHEEZING OR SHORTNESS OF BREATH AS NEEDED 18 g 1   amLODipine (NORVASC) 10 MG tablet Take 1 tablet (10 mg total) by mouth daily. 90 tablet 3   aspirin EC 81 MG tablet Take 1 tablet (81 mg total) by mouth daily.     Evolocumab (REPATHA SURECLICK) 606 MG/ML SOAJ Inject 1 Dose into the skin every 14 (fourteen) days. 2 mL 11   fluticasone (FLONASE) 50 MCG/ACT nasal spray USE 2 SPRAYS INTO EACH NOSTRIL ONCE DAILY 16 g 5   hydrOXYzine (ATARAX/VISTARIL) 10 MG tablet Take 1 tablet (10 mg total) by mouth at bedtime as needed. 90 tablet 3   ibuprofen (ADVIL) 600 MG tablet Take 1 tablet (600 mg total) by mouth every 8 (eight) hours as needed for moderate pain. 60 tablet 1   isosorbide mononitrate (IMDUR) 60 MG 24 hr tablet TAKE 1 TABLET BY MOUTH ONCE DAILY 90 tablet 1   losartan (COZAAR) 100 MG tablet Take 1 tablet (100  mg total) by mouth daily. 90 tablet 3   meloxicam (MOBIC) 15 MG tablet TAKE 1 TABLET BY MOUTH ONCE DAILY AS NEEDED FOR PAIN WITH FOOD 30 tablet 5   sertraline (ZOLOFT) 100 MG tablet Take 1 tablet (100 mg total) by mouth daily. 90 tablet 3   SPIRIVA HANDIHALER 18 MCG inhalation capsule INHALE CONTENTS OF 1 CAPSULE ONCE DAILY 30 capsule 11   No current facility-administered medications for this visit.    Patient confirms/reports the following allergies:  Allergies  Allergen Reactions   Bactrim [Sulfamethoxazole-Trimethoprim] Nausea And Vomiting   Aleve [Naproxen Sodium] Swelling    Lips. But patient can take Ibuprofen without problems   Atorvastatin     myalgias   Crestor [Rosuvastatin]     5mg , 20mg  - myalgias   Simvastatin     myalgias   Zetia [Ezetimibe]     myalgias    No orders of the defined types were placed in this encounter.   AUTHORIZATION INFORMATION Primary Insurance: 1D#: Group #:  Secondary Insurance: 1D#: Group #:  SCHEDULE INFORMATION: Date: 05/15/21 Time: Location: [place]

## 2021-05-05 ENCOUNTER — Telehealth: Payer: Self-pay | Admitting: Cardiology

## 2021-05-05 NOTE — Telephone Encounter (Signed)
   [facility] Pre-operative Risk Assessment    Patient Name: Kristin Parker  DOB: 11-11-1950 MRN: [id number]  HEARTCARE STAFF:  - IMPORTANT!!!!!! Under Visit Info/Reason for Call, type in Other and utilize the format Clearance MM/DD/YY or Clearance TBD. Do not use dashes or single digits. - Please review there is not already an duplicate clearance open for this procedure. - If request is for dental extraction, please clarify the # of teeth to be extracted. - If the patient is currently at the dentist's office, call Pre-Op Callback Staff (MA/nurse) to input urgent request.  - If the patient is not currently in the dentist office, please route to the Pre-Op pool.  Request for surgical clearance:  What type of surgery is being performed? Colonoscopy  When is this surgery scheduled? 05/15/21  What type of clearance is required (medical clearance vs. Pharmacy clearance to hold med vs. Both)? both  Are there any medications that need to be held prior to surgery and how long? Not listed, please advise if needed  Practice name and name of physician performing surgery? [facility] - Dr Vicente Males  What is the office phone number? [phone]   7.   What is the office fax number? [phone]  8.   Anesthesia type (None, local, MAC, general) ? Not listed    Ace Gins 05/05/2021, 11:28 AM  _________________________________________________________________   (provider comments below)

## 2021-05-05 NOTE — Telephone Encounter (Signed)
   Name: Kristin Parker  DOB: 1951-05-08  MRN: [id number]   Primary Cardiologist: [name], MD  Chart reviewed as part of pre-operative protocol coverage. Patient was contacted 05/05/2021 in reference to pre-operative risk assessment for pending surgery as outlined below.  [name] was last seen on 02/2021 by Dr Debara Pickett for lipid clinic follow-up and 10/2020 by Laurann Montana for general cardiology follow-up. Per chart review she has history of nonobstructive CAD, PAD, carotid artery disease, aortic atherosclerosis, HLD, anxiety, HTN. 2D echo 09/2020 EF 55-60%, mild LVH, grade 1 DD, normal RV, mild aortic sclerosis w/o stenosis. CAD last assessed in 09/2020 with mild nonobstructive stenosis.  Got VM. LMTCB.  Charlie Pitter, PA-C 05/05/2021, 12:36 PM

## 2021-05-09 NOTE — Telephone Encounter (Signed)
[facility] faxing to check on status of clearance. Fax states this is their 3rd attempt

## 2021-05-10 NOTE — Telephone Encounter (Signed)
Clearance notes have been re-faxed again.

## 2021-05-10 NOTE — Telephone Encounter (Signed)
Name: Kristin Parker  DOB: 12-09-1950  MRN: [id number]   Primary Cardiologist: Yvonne Kendall, MD  Chart reviewed as part of pre-operative protocol coverage. Patient was contacted 05/10/2021 in reference to pre-operative risk assessment for pending surgery as outlined below.  Chinda Shinabery was last seen on 11/30/20 by Dr. Rennis Golden.  Since that day, Jenene Bartz has done well. She is able to complete 4.0 METS without angina. She has a history of mild nonobstructive CAD, no history of PCI. From a cardiovascular perspective, she may hold ASA if needed to reduce bleeding risk.   Therefore, based on ACC/AHA guidelines, the patient would be at acceptable risk for the planned procedure without further cardiovascular testing.   The patient was advised that if she develops new symptoms prior to surgery to contact our office to arrange for a follow-up visit, and she verbalized understanding.  I will route this recommendation to the requesting party via Epic fax function and remove from pre-op pool. Please call with questions.  Roe Rutherford Shadai Mcclane, PA 05/10/2021, 8:20 AM

## 2021-05-15 ENCOUNTER — Encounter
Admission: RE | Disposition: A | Discharge status: Home / Self Care | Payer: Self-pay | Source: Home / Self Care | Attending: Gastroenterology

## 2021-05-15 ENCOUNTER — Ambulatory Visit: Payer: Medicare HMO | Admitting: Certified Registered Nurse Anesthetist

## 2021-05-15 ENCOUNTER — Ambulatory Visit
Admission: RE | Admit: 2021-05-15 | Discharge: 2021-05-15 | Disposition: A | Discharge status: Home / Self Care | Payer: Medicare HMO | Attending: Gastroenterology | Admitting: Gastroenterology

## 2021-05-15 ENCOUNTER — Encounter: Payer: Self-pay | Admitting: Gastroenterology

## 2021-05-15 DIAGNOSIS — K635 Polyp of colon: Secondary | ICD-10-CM | POA: Diagnosis not present

## 2021-05-15 DIAGNOSIS — E782 Mixed hyperlipidemia: Secondary | ICD-10-CM | POA: Insufficient documentation

## 2021-05-15 DIAGNOSIS — Z8052 Family history of malignant neoplasm of bladder: Secondary | ICD-10-CM | POA: Diagnosis not present

## 2021-05-15 DIAGNOSIS — Z881 Allergy status to other antibiotic agents status: Secondary | ICD-10-CM | POA: Insufficient documentation

## 2021-05-15 DIAGNOSIS — J449 Chronic obstructive pulmonary disease, unspecified: Secondary | ICD-10-CM | POA: Insufficient documentation

## 2021-05-15 DIAGNOSIS — Z9049 Acquired absence of other specified parts of digestive tract: Secondary | ICD-10-CM | POA: Diagnosis not present

## 2021-05-15 DIAGNOSIS — Z87891 Personal history of nicotine dependence: Secondary | ICD-10-CM | POA: Insufficient documentation

## 2021-05-15 DIAGNOSIS — D120 Benign neoplasm of cecum: Secondary | ICD-10-CM | POA: Insufficient documentation

## 2021-05-15 DIAGNOSIS — I10 Essential (primary) hypertension: Secondary | ICD-10-CM | POA: Insufficient documentation

## 2021-05-15 DIAGNOSIS — D123 Benign neoplasm of transverse colon: Secondary | ICD-10-CM | POA: Insufficient documentation

## 2021-05-15 DIAGNOSIS — Z1211 Encounter for screening for malignant neoplasm of colon: Secondary | ICD-10-CM | POA: Diagnosis not present

## 2021-05-15 DIAGNOSIS — Z86010 Personal history of colonic polyps: Secondary | ICD-10-CM

## 2021-05-15 DIAGNOSIS — Z7982 Long term (current) use of aspirin: Secondary | ICD-10-CM | POA: Diagnosis not present

## 2021-05-15 DIAGNOSIS — Z79899 Other long term (current) drug therapy: Secondary | ICD-10-CM | POA: Insufficient documentation

## 2021-05-15 DIAGNOSIS — Z888 Allergy status to other drugs, medicaments and biological substances status: Secondary | ICD-10-CM | POA: Insufficient documentation

## 2021-05-15 DIAGNOSIS — Z8249 Family history of ischemic heart disease and other diseases of the circulatory system: Secondary | ICD-10-CM | POA: Diagnosis not present

## 2021-05-15 DIAGNOSIS — Z886 Allergy status to analgesic agent status: Secondary | ICD-10-CM | POA: Insufficient documentation

## 2021-05-15 DIAGNOSIS — D122 Benign neoplasm of ascending colon: Secondary | ICD-10-CM | POA: Insufficient documentation

## 2021-05-15 DIAGNOSIS — Z801 Family history of malignant neoplasm of trachea, bronchus and lung: Secondary | ICD-10-CM | POA: Diagnosis not present

## 2021-05-15 DIAGNOSIS — D125 Benign neoplasm of sigmoid colon: Secondary | ICD-10-CM | POA: Insufficient documentation

## 2021-05-15 HISTORY — DX: Chronic obstructive pulmonary disease, unspecified: J44.9

## 2021-05-15 HISTORY — PX: COLONOSCOPY WITH PROPOFOL: SHX5780

## 2021-05-15 SURGERY — COLONOSCOPY WITH PROPOFOL
Anesthesia: General

## 2021-05-15 MED ORDER — EPHEDRINE SULFATE 50 MG/ML IJ SOLN
INTRAMUSCULAR | Status: DC | PRN
  Administered 2021-05-15: 10 mg via INTRAVENOUS

## 2021-05-15 MED ORDER — PROPOFOL 500 MG/50ML IV EMUL
INTRAVENOUS | Status: DC | PRN
  Administered 2021-05-15: 170 ug/kg/min via INTRAVENOUS

## 2021-05-15 MED ORDER — SODIUM CHLORIDE 0.9 % IV SOLN: INTRAVENOUS | Status: DC

## 2021-05-15 MED ORDER — PROPOFOL 10 MG/ML IV BOLUS
INTRAVENOUS | Status: DC | PRN
  Administered 2021-05-15: 40 mg via INTRAVENOUS
  Administered 2021-05-15: 70 mg via INTRAVENOUS
  Administered 2021-05-15: 30 mg via INTRAVENOUS

## 2021-05-15 MED ORDER — EPHEDRINE 5 MG/ML INJ
INTRAVENOUS | Status: AC
  Filled 2021-05-15: qty 10

## 2021-05-15 NOTE — Anesthesia Preprocedure Evaluation (Addendum)
Anesthesia Evaluation  Patient identified by MRN, date of birth, ID band Patient awake    Reviewed: Allergy & Precautions, NPO status , Patient's Chart, lab work & pertinent test results  Airway Mallampati: I  TM Distance: >3 FB Neck ROM: Full    Dental no notable dental hx.    Pulmonary COPD,  COPD inhaler, Current Smoker and Patient abstained from smoking., former smoker,    Pulmonary exam normal breath sounds clear to auscultation       Cardiovascular hypertension, Pt. on medications (-) angina+ DOE  Normal cardiovascular exam Rhythm:Regular Rate:Normal     Neuro/Psych Anxiety negative neurological ROS     GI/Hepatic Bowel prep,  Endo/Other  negative endocrine ROS  Renal/GU negative Renal ROS  Female GU complaint     Musculoskeletal  (+) Arthritis , Osteoarthritis,    Abdominal Normal abdominal exam  (+)   Peds  Hematology negative hematology ROS (+)   Anesthesia Other Findings   Reproductive/Obstetrics                            Anesthesia Physical Anesthesia Plan  ASA: 3  Anesthesia Plan: General   Post-op Pain Management:    Induction: Intravenous  PONV Risk Score and Plan: 1 and Propofol infusion and TIVA  Airway Management Planned: Natural Airway and Nasal Cannula  Additional Equipment:   Intra-op Plan:   Post-operative Plan:   Informed Consent: I have reviewed the patients History and Physical, chart, labs and discussed the procedure including the risks, benefits and alternatives for the proposed anesthesia with the patient or authorized representative who has indicated his/her understanding and acceptance.     Dental Advisory Given  Plan Discussed with: Anesthesiologist, CRNA and Surgeon  Anesthesia Plan Comments: (Patient consented for risks of anesthesia including but not limited to:  - adverse reactions to medications - risk of airway placement if  required - damage to eyes, teeth, lips or other oral mucosa - nerve damage due to positioning  - sore throat or hoarseness - Damage to heart, brain, nerves, lungs, other parts of body or loss of life  Patient voiced understanding.)        Anesthesia Quick Evaluation

## 2021-05-15 NOTE — Transfer of Care (Signed)
Immediate Anesthesia Transfer of Care Note  Patient: Kristin Parker  Procedure(s) Performed: COLONOSCOPY WITH PROPOFOL  Patient Location: PACU  Anesthesia Type:General  Level of Consciousness: drowsy  Airway & Oxygen Therapy: Patient Spontanous Breathing  Post-op Assessment: Report given to RN and Post -op Vital signs reviewed and stable  Post vital signs: Reviewed and stable  Last Vitals:  Vitals Value Taken Time  BP 99/38 05/15/21 1041  Temp 35.9 C 05/15/21 1040  Pulse 60 05/15/21 1041  Resp 21 05/15/21 1041  SpO2 96 % 05/15/21 1041  Vitals shown include unvalidated device data.  Last Pain:  Vitals:   05/15/21 1040  TempSrc: Tympanic  PainSc: Asleep         Complications: No notable events documented.

## 2021-05-15 NOTE — Op Note (Signed)
[facility] Patient Name: Kristin Parker Procedure Date: 05/15/2021 10:02 AM MRN: [id number] Account #: [id number] Date of Birth: April 05, 1951 Admit Type: Outpatient Age: 70 Room: [facility] ROOM 4 Gender: Female Note Status: Finalized Procedure:             Colonoscopy Indications:           Surveillance: Personal history of colonic polyps                         (unknown histology) on last colonoscopy more than 5                         years ago Providers:             Jonathon Bellows MD, MD Referring MD:          Olin Hauser (Referring MD) Medicines:             Monitored Anesthesia Care Complications:         No immediate complications. Procedure:             Pre-Anesthesia Assessment:                        - Prior to the procedure, a History and Physical was                         performed, and patient medications, allergies and                         sensitivities were reviewed. The patient's tolerance                         of previous anesthesia was reviewed.                        - The risks and benefits of the procedure and the                         sedation options and risks were discussed with the                         patient. All questions were answered and informed                         consent was obtained.                        - ASA Grade Assessment: II - A patient with mild                         systemic disease.                        After obtaining informed consent, the colonoscope was                         passed under direct vision. Throughout the procedure,                         the patient's blood pressure, pulse, and oxygen  saturations were monitored continuously. The                         Colonoscope was introduced through the anus and                         advanced to the the cecum, identified by the                         appendiceal orifice. The colonoscopy was  performed                         with ease. The patient tolerated the procedure well.                         The quality of the bowel preparation was good. Findings:      The perianal and digital rectal examinations were normal.      Three sessile polyps were found in the sigmoid colon. The polyps were 4       to 5 mm in size. These polyps were removed with a cold snare. Resection       and retrieval were complete.      A 10 mm polyp was found in the sigmoid colon. The polyp was       semi-pedunculated. The polyp was removed with a hot snare. Resection and       retrieval were complete.      A 7 mm polyp was found in the ascending colon. The polyp was sessile.       The polyp was removed with a cold snare. Resection and retrieval were       complete.      Two sessile polyps were found in the cecum. The polyps were 10 to 12 mm       in size. These polyps were removed with a cold snare. Resection and       retrieval were complete.      Two semi-pedunculated polyps were found in the transverse colon. The       polyps were 8 to 10 mm in size. These polyps were removed with a cold       snare. Resection and retrieval were complete. To prevent bleeding after       the polypectomy, two hemostatic clips were successfully placed. There       was no bleeding during, or at the end, of the procedure.      The exam was otherwise without abnormality on direct and retroflexion       views. Impression:            - Three 4 to 5 mm polyps in the sigmoid colon, removed                         with a cold snare. Resected and retrieved.                        - One 10 mm polyp in the sigmoid colon, removed with a                         hot snare. Resected and retrieved.                        -  One 7 mm polyp in the ascending colon, removed with                         a cold snare. Resected and retrieved.                        - Two 10 to 12 mm polyps in the cecum, removed with a                          cold snare. Resected and retrieved.                        - Two 8 to 10 mm polyps in the transverse colon,                         removed with a cold snare. Resected and retrieved.                         Clips were placed.                        - The examination was otherwise normal on direct and                         retroflexion views. Recommendation:        - Discharge patient to home (with escort).                        - Resume previous diet.                        - Continue present medications.                        - Await pathology results.                        - Repeat colonoscopy in 1 year for surveillance based                         on pathology results. Procedure Code(s):     --- Professional ---                        [phone], Colonoscopy, flexible; with removal of                         tumor(s), polyp(s), or other lesion(s) by snare                         technique Diagnosis Code(s):     --- Professional ---                        K63.5, Polyp of colon                        Z86.010, Personal history of colonic polyps CPT copyright 2019 American Medical Association. All rights reserved. The codes documented in this report are preliminary and upon coder review may  be revised to meet current compliance  requirements. Jonathon Bellows, MD Jonathon Bellows MD, MD 05/15/2021 10:41:10 AM This report has been signed electronically. Number of Addenda: 0 Note Initiated On: 05/15/2021 10:02 AM Scope Withdrawal Time: 0 hours 19 minutes 34 seconds  Total Procedure Duration: 0 hours 29 minutes 48 seconds  Estimated Blood Loss:  Estimated blood loss: none.      [facility]

## 2021-05-15 NOTE — Anesthesia Postprocedure Evaluation (Signed)
Anesthesia Post Note  Patient: Kristin Parker  Procedure(s) Performed: COLONOSCOPY WITH PROPOFOL  Patient location during evaluation: PACU Anesthesia Type: General Level of consciousness: awake and alert Pain management: pain level controlled Vital Signs Assessment: post-procedure vital signs reviewed and stable Respiratory status: spontaneous breathing, nonlabored ventilation, respiratory function stable and patient connected to nasal cannula oxygen Cardiovascular status: blood pressure returned to baseline and stable Postop Assessment: no apparent nausea or vomiting Anesthetic complications: no   No notable events documented.   Last Vitals:  Vitals:   05/15/21 1100 05/15/21 1110  BP: 133/65 (!) 152/66  Pulse: 69 69  Resp: 19 18  Temp:    SpO2: 94% 94%    Last Pain:  Vitals:   05/15/21 1110  TempSrc:   PainSc: 0-No pain                 [place] Ouch

## 2021-05-15 NOTE — H&P (Signed)
Kristin Bellows, MD [address] [place], [place], [place], [address] [address], [place], [place], [place], [address] Phone: [phone]  Fax: [phone]  Primary Care Physician:  Olin Hauser, DO   Pre-Procedure History & Physical: HPI:  Kristin Parker is a 70 y.o. female is here for an colonoscopy.   Past Medical History:  Diagnosis Date   Anxiety    Aortic valve sclerosis    Bilateral carotid bruits    Carotid artery stenosis    COPD (chronic obstructive pulmonary disease) (HCC)    Hypertension    Mitral regurgitation    Mixed hyperlipidemia    Shoulder pain    Sleeping difficulties     Past Surgical History:  Procedure Laterality Date   BREAST BIOPSY Left 08/13/2018   affirm bx, x clip, path pending   CATARACT EXTRACTION Left    2017   CHOLECYSTECTOMY     COLONOSCOPY  2012   Dr Candace Cruise   LEFT HEART CATH AND CORONARY ANGIOGRAPHY N/A 06/25/2017   Procedure: LEFT HEART CATH AND CORONARY ANGIOGRAPHY;  Surgeon: [name], MD;  Location: [facility];  Service: Cardiovascular;  Laterality: N/A;    Prior to Admission medications   Medication Sig Start Date End Date Taking? Authorizing Provider  amLODipine (NORVASC) 10 MG tablet Take 1 tablet (10 mg total) by mouth daily. 09/08/20  Yes Karamalegos, Devonne Doughty, DO  aspirin EC 81 MG tablet Take 1 tablet (81 mg total) by mouth daily. 03/12/17  Yes End, Harrell Gave, MD  fluticasone [facility]) 50 MCG/ACT nasal spray USE 2 SPRAYS INTO EACH NOSTRIL ONCE DAILY 10/31/20  Yes Karamalegos, Devonne Doughty, DO  isosorbide mononitrate (IMDUR) 60 MG 24 hr tablet TAKE 1 TABLET BY MOUTH ONCE DAILY 01/30/21  Yes End, Harrell Gave, MD  losartan (COZAAR) 100 MG tablet Take 1 tablet (100 mg total) by mouth daily. 09/08/20  Yes Karamalegos, Devonne Doughty, DO  sertraline (ZOLOFT) 100 MG tablet Take 1 tablet (100 mg total) by mouth daily. 09/08/20  Yes Karamalegos, Devonne Doughty, DO  SPIRIVA HANDIHALER 18 MCG inhalation capsule  INHALE CONTENTS OF 1 CAPSULE ONCE DAILY 06/11/20  Yes Karamalegos, Devonne Doughty, DO  acetaminophen (TYLENOL) 500 MG tablet Take 2 tablets (1,000 mg total) by mouth every 6 (six) hours as needed for mild pain. 02/19/21   Piscoya, Jacqulyn [facility], MD  albuterol (VENTOLIN HFA) 108 (90 Base) MCG/ACT inhaler INHALE 2 PUFFS EVERY 4 HOURS FOR WHEEZING OR SHORTNESS OF BREATH AS NEEDED 03/13/21   Karamalegos, Devonne Doughty, DO  Evolocumab (REPATHA SURECLICK) 681 MG/ML SOAJ Inject 1 Dose into the skin every 14 (fourteen) days. 03/13/21   Hilty, Nadean Corwin, MD  hydrOXYzine (ATARAX/VISTARIL) 10 MG tablet Take 1 tablet (10 mg total) by mouth at bedtime as needed. 03/08/21   Karamalegos, Devonne Doughty, DO  ibuprofen (ADVIL) 600 MG tablet Take 1 tablet (600 mg total) by mouth every 8 (eight) hours as needed for moderate pain. 02/19/21   Olean Ree, MD  meloxicam (MOBIC) 15 MG tablet TAKE 1 TABLET BY MOUTH ONCE DAILY AS NEEDED FOR PAIN WITH FOOD 09/26/20   Olin Hauser, DO    Allergies as of 05/03/2021 - Review Complete 05/03/2021  Allergen Reaction Noted   Bactrim [sulfamethoxazole-trimethoprim] Nausea And Vomiting 12/23/2018   Aleve [naproxen sodium] Swelling 07/08/2015   Atorvastatin  03/13/2021   Crestor [rosuvastatin]  03/13/2021   Simvastatin  03/13/2021   Zetia [ezetimibe]  03/13/2021    Family History  Problem Relation Age of Onset  Cancer Mother        bladder cancer   Heart disease Mother 76       Pacemaker   Cancer Father        lung   Multiple sclerosis Sister    Valvular heart disease Brother 17       s/p bioprosthetic valve replacement at [facility]   Breast cancer Neg Hx     Social History   Socioeconomic History   Marital status: Divorced    Spouse name: Not on file   Number of children: Not on file   Years of education: Not on file   Highest education level: GED or equivalent  Occupational History   Occupation: retired   Tobacco Use   Smoking status: Former    Packs/day: 0.50     Years: 30.00    Pack years: 15.00    Types: Cigarettes    Quit date: 10/30/2020    Years since quitting: 0.5   Smokeless tobacco: Never   Tobacco comments:    not quite ready due to covid-19   Vaping Use   Vaping Use: Former  Substance and Sexual Activity   Alcohol use: No    Alcohol/week: 0.0 standard drinks   Drug use: No   Sexual activity: Not on file  Other Topics Concern   Not on file  Social History Narrative   Not on file   Social Determinants of Health   Financial Resource Strain: Not on file  Food Insecurity: Not on file  Transportation Needs: Not on file  Physical Activity: Not on file  Stress: Not on file  Social Connections: Not on file  Intimate Partner Violence: Not on file    Review of Systems: See HPI, otherwise negative ROS  Physical Exam: BP 128/77   Pulse 94   Temp (!) 97 F (36.1 C) (Temporal)   Ht 5\' 7"  (1.702 m)   Wt 75.3 kg   SpO2 94%   BMI 26.00 kg/m  General:   Alert,  pleasant and cooperative in NAD Head:  Normocephalic and atraumatic. Neck:  Supple; no masses or thyromegaly. Lungs:  Clear throughout to auscultation, normal respiratory effort.    Heart:  +S1, +S2, Regular rate and rhythm, No edema. Abdomen:  Soft, nontender and nondistended. Normal bowel sounds, without guarding, and without rebound.   Neurologic:  Alert and  oriented x4;  grossly normal neurologically.  Impression/Plan: Ladan Vanderzanden is here for an colonoscopy to be performed for surveillance due to prior history of colon polyps   Risks, benefits, limitations, and alternatives regarding  colonoscopy have been reviewed with the patient.  Questions have been answered.  All parties agreeable.   Kristin Bellows, MD  05/15/2021, 10:02 AM

## 2021-05-16 ENCOUNTER — Encounter: Payer: Self-pay | Admitting: Gastroenterology

## 2021-05-16 LAB — SURGICAL PATHOLOGY

## 2021-05-24 ENCOUNTER — Other Ambulatory Visit: Payer: Self-pay | Admitting: Family Medicine

## 2021-05-24 DIAGNOSIS — J432 Centrilobular emphysema: Secondary | ICD-10-CM

## 2021-05-26 ENCOUNTER — Other Ambulatory Visit: Payer: Self-pay | Admitting: Family Medicine

## 2021-05-26 ENCOUNTER — Other Ambulatory Visit: Payer: Self-pay | Admitting: Internal Medicine

## 2021-05-26 ENCOUNTER — Other Ambulatory Visit: Payer: Self-pay | Admitting: Surgery

## 2021-05-26 DIAGNOSIS — J3089 Other allergic rhinitis: Secondary | ICD-10-CM

## 2021-05-26 DIAGNOSIS — M25511 Pain in right shoulder: Secondary | ICD-10-CM

## 2021-05-26 DIAGNOSIS — G8929 Other chronic pain: Secondary | ICD-10-CM

## 2021-06-19 ENCOUNTER — Telehealth: Payer: Self-pay | Admitting: Family Medicine

## 2021-06-19 NOTE — Telephone Encounter (Signed)
Left message for patient to call back and schedule the Medicare Annual Wellness Visit (AWV) virtually or by telephone.  Last AWV 07/07/19  Please schedule at anytime with [place].  40 minute appointment  Any questions, please call me at [phone]

## 2021-06-28 ENCOUNTER — Telehealth: Payer: Self-pay

## 2021-06-28 NOTE — Telephone Encounter (Signed)
Patient arrived in clinic around 4pm today  We have no available apt until end of next week or 2 weeks from now on my schedule.  She will need to seek care at an Urgent Care to have urinalysis and urine culture tested at this time. Otherwise could check Webb Silversmith, FNP schedule.  If you could follow up with her - and find out status - if she went to UC or we can try to figure out an alternative plan if theres a better day we can double book a virtual if needed. But she needs urine tested before any treatment.  Nobie Putnam, [facility] 06/28/2021, 6:28 PM

## 2021-06-28 NOTE — Telephone Encounter (Signed)
Copied from [place] [id number]. Topic: General - Other >> Jun 28, 2021  1:17 PM Valere Dross wrote: Reason for CRM: Pt called in stating she thinks she has a UTI and wanted to speak to someone about what she can do or take, because she does not want to wait for an appt. Please advise.    06/28/21- 4pm  Not sure why the message I sent around 2pm did not get sent. I had called her earlier at [phone] to get an update on her symptoms but I wasn't able to leave a message.

## 2021-07-03 ENCOUNTER — Other Ambulatory Visit: Payer: Self-pay | Admitting: Family Medicine

## 2021-07-03 DIAGNOSIS — J432 Centrilobular emphysema: Secondary | ICD-10-CM

## 2021-07-04 NOTE — Telephone Encounter (Signed)
Requested Prescriptions  Pending Prescriptions Disp Refills  . albuterol (VENTOLIN HFA) 108 (90 Base) MCG/ACT inhaler [Pharmacy Med Name: ALBUTEROL SULFATE HFA 108 (90 BASE)] 18 g 1    Sig: INHALE 2 PUFFS EVERY 4 HOURS FOR WHEEZING OR SHORTNESS OF BREATH AS NEEDED     Pulmonology:  Beta Agonists Failed - 07/03/2021  9:49 AM      Failed - One inhaler should last at least one month. If the patient is requesting refills earlier, contact the patient to check for uncontrolled symptoms.      Passed - Valid encounter within last 12 months    Recent Outpatient Visits          3 months ago Elevated hemoglobin A1c   [place], DO   4 months ago Distended abdomen   [place], DO   9 months ago Mixed hyperlipidemia   [place], DO   1 year ago Urinary tract infection due to extended-spectrum beta lactamase (ESBL) producing Escherichia coli   [place], DO   1 year ago Acute cystitis without hematuria   [place], DO      Future Appointments            In 2 months [place], Devonne Doughty, [facility], [facility]

## 2021-08-15 ENCOUNTER — Encounter: Payer: Self-pay | Admitting: General Surgery

## 2021-08-26 ENCOUNTER — Other Ambulatory Visit: Payer: Self-pay | Admitting: Internal Medicine

## 2021-08-26 ENCOUNTER — Other Ambulatory Visit: Payer: Self-pay | Admitting: Family Medicine

## 2021-08-26 DIAGNOSIS — J432 Centrilobular emphysema: Secondary | ICD-10-CM

## 2021-08-26 DIAGNOSIS — I10 Essential (primary) hypertension: Secondary | ICD-10-CM

## 2021-08-26 DIAGNOSIS — F419 Anxiety disorder, unspecified: Secondary | ICD-10-CM

## 2021-08-26 NOTE — Telephone Encounter (Signed)
Requested Prescriptions  Pending Prescriptions Disp Refills  . SPIRIVA HANDIHALER 18 MCG inhalation capsule [Pharmacy Med Name: SPIRIVA HANDIHALER 18 MCG INH CAP] 30 capsule 2    Sig: INHALE CONTENTS OF 1 CAPSULE ONCE DAILY     Pulmonology:  Anticholinergic Agents Passed - 08/26/2021  4:45 PM      Passed - Valid encounter within last 12 months    Recent Outpatient Visits          5 months ago Elevated hemoglobin A1c   [place], DO   6 months ago Distended abdomen   [place], DO   11 months ago Mixed hyperlipidemia   [place], DO   1 year ago Urinary tract infection due to extended-spectrum beta lactamase (ESBL) producing Escherichia coli   [place], DO   1 year ago Acute cystitis without hematuria   [place], DO      Future Appointments            In 2 weeks [place] Ranger, Devonne Doughty, DO [facility], [place]           . albuterol (VENTOLIN HFA) 108 (90 Base) MCG/ACT inhaler [Pharmacy Med Name: ALBUTEROL SULFATE HFA 108 (90 BASE)] 18 g 1    Sig: INHALE 2 PUFFS EVERY 4 HOURS FOR WHEEZING OR SHORTNESS OF BREATH AS NEEDED     Pulmonology:  Beta Agonists Failed - 08/26/2021  4:45 PM      Failed - One inhaler should last at least one month. If the patient is requesting refills earlier, contact the patient to check for uncontrolled symptoms.      Passed - Valid encounter within last 12 months    Recent Outpatient Visits          5 months ago Elevated hemoglobin A1c   [place], DO   6 months ago Distended abdomen   [place], [place]   11 months ago Mixed hyperlipidemia   [place], DO   1 year ago Urinary tract infection due to  extended-spectrum beta lactamase (ESBL) producing Escherichia coli   [place], DO   1 year ago Acute cystitis without hematuria   [place], DO      Future Appointments            In 2 weeks [place] Ranger, Devonne Doughty, DO [facility], [place]           . sertraline (ZOLOFT) 100 MG tablet [Pharmacy Med Name: SERTRALINE HCL 100 MG TAB] 90 tablet 0    Sig: TAKE 1 TABLET BY MOUTH ONCE DAILY     Psychiatry:  Antidepressants - SSRI Passed - 08/26/2021  4:45 PM      Passed - Valid encounter within last 6 months    Recent Outpatient Visits          5 months ago Elevated hemoglobin A1c   [place], DO   6 months ago Distended abdomen   [place], DO   11 months ago Mixed hyperlipidemia   [place], DO   1 year ago Urinary tract infection due to extended-spectrum beta lactamase (ESBL) producing  Escherichia coli   [place], DO   1 year ago Acute cystitis without hematuria   [place], DO      Future Appointments            In 2 weeks [place] Ranger, Devonne Doughty, DO [facility], [facility]           . losartan (COZAAR) 100 MG tablet [Pharmacy Med Name: LOSARTAN POTASSIUM 100 MG TAB] 90 tablet 0    Sig: TAKE 1 TABLET BY MOUTH ONCE DAILY     Cardiovascular:  Angiotensin Receptor Blockers Failed - 08/26/2021  4:45 PM      Failed - Cr in normal range and within 180 days    Creat  Date Value Ref Range Status  03/01/2021 0.96 (H) 0.60 - 0.93 mg/dL Final    Comment:    For patients >79 years of age, the reference limit for Creatinine is approximately 13% higher for people identified as African-American. .          Failed - Last BP in normal range    BP Readings  from Last 1 Encounters:  05/15/21 (!) 152/66         Passed - K in normal range and within 180 days    Potassium  Date Value Ref Range Status  03/01/2021 3.8 3.5 - 5.3 mmol/L Final         Passed - Patient is not pregnant      Passed - Valid encounter within last 6 months    Recent Outpatient Visits          5 months ago Elevated hemoglobin A1c   [place], DO   6 months ago Distended abdomen   [place], DO   11 months ago Mixed hyperlipidemia   [place], DO   1 year ago Urinary tract infection due to extended-spectrum beta lactamase (ESBL) producing Escherichia coli   [place], DO   1 year ago Acute cystitis without hematuria   [place], DO      Future Appointments            In 2 weeks Parks Ranger, Devonne Doughty, [facility], [facility]

## 2021-09-01 ENCOUNTER — Other Ambulatory Visit: Payer: Self-pay

## 2021-09-01 DIAGNOSIS — R7309 Other abnormal glucose: Secondary | ICD-10-CM

## 2021-09-01 DIAGNOSIS — E782 Mixed hyperlipidemia: Secondary | ICD-10-CM

## 2021-09-01 DIAGNOSIS — I10 Essential (primary) hypertension: Secondary | ICD-10-CM

## 2021-09-01 DIAGNOSIS — Z0000 Encounter for general adult medical examination without abnormal findings: Secondary | ICD-10-CM

## 2021-09-04 ENCOUNTER — Other Ambulatory Visit: Payer: Medicare HMO

## 2021-09-06 DIAGNOSIS — I10 Essential (primary) hypertension: Secondary | ICD-10-CM | POA: Diagnosis not present

## 2021-09-06 DIAGNOSIS — E782 Mixed hyperlipidemia: Secondary | ICD-10-CM | POA: Diagnosis not present

## 2021-09-06 DIAGNOSIS — Z0000 Encounter for general adult medical examination without abnormal findings: Secondary | ICD-10-CM | POA: Diagnosis not present

## 2021-09-06 DIAGNOSIS — R7309 Other abnormal glucose: Secondary | ICD-10-CM | POA: Diagnosis not present

## 2021-09-07 LAB — TSH: TSH: 2.73 mIU/L (ref 0.40–4.50)

## 2021-09-07 LAB — LIPID PANEL
Cholesterol: 161 mg/dL (ref <200)
HDL: 37 mg/dL — ABNORMAL LOW (ref >=50)
LDL Cholesterol (Calc): 92 mg/dL (calc)
Non-HDL Cholesterol (Calc): 124 mg/dL (calc) (ref <130)
Total CHOL/HDL Ratio: 4.4 (calc) (ref <5.0)
Triglycerides: 230 mg/dL — ABNORMAL HIGH (ref <150)

## 2021-09-07 LAB — COMPLETE METABOLIC PANEL WITH GFR
AG Ratio: 1.7 (calc) (ref 1.0–2.5)
ALT: 14 U/L (ref 6–29)
AST: 15 U/L (ref 10–35)
Albumin: 4.2 g/dL (ref 3.6–5.1)
Alkaline phosphatase (APISO): 123 U/L (ref 37–153)
BUN: 12 mg/dL (ref 7–25)
CO2: 26 mmol/L (ref 20–32)
Calcium: 9.1 mg/dL (ref 8.6–10.4)
Chloride: 105 mmol/L (ref 98–110)
Creat: 0.82 mg/dL (ref 0.60–1.00)
Globulin: 2.5 g/dL (calc) (ref 1.9–3.7)
Glucose, Bld: 101 mg/dL — ABNORMAL HIGH (ref 65–99)
Potassium: 3.7 mmol/L (ref 3.5–5.3)
Sodium: 144 mmol/L (ref 135–146)
Total Bilirubin: 0.3 mg/dL (ref 0.2–1.2)
Total Protein: 6.7 g/dL (ref 6.1–8.1)
eGFR: 76 mL/min/{1.73_m2} (ref >=60)

## 2021-09-07 LAB — CBC WITH DIFFERENTIAL/PLATELET
Absolute Monocytes: 499 cells/uL (ref 200–950)
Basophils Absolute: 78 cells/uL (ref 0–200)
Basophils Relative: 1 %
Eosinophils Absolute: 328 cells/uL (ref 15–500)
Eosinophils Relative: 4.2 %
HCT: 44 % (ref 35.0–45.0)
Hemoglobin: 14.9 g/dL (ref 11.7–15.5)
Lymphs Abs: 2512 cells/uL (ref 850–3900)
MCH: 30.3 pg (ref 27.0–33.0)
MCHC: 33.9 g/dL (ref 32.0–36.0)
MCV: 89.4 fL (ref 80.0–100.0)
MPV: 9.9 fL (ref 7.5–12.5)
Monocytes Relative: 6.4 %
Neutro Abs: 4384 cells/uL (ref 1500–7800)
Neutrophils Relative %: 56.2 %
Platelets: 339 10*3/uL (ref 140–400)
RBC: 4.92 10*6/uL (ref 3.80–5.10)
RDW: 13.6 % (ref 11.0–15.0)
Total Lymphocyte: 32.2 %
WBC: 7.8 10*3/uL (ref 3.8–10.8)

## 2021-09-07 LAB — HEMOGLOBIN A1C
Hgb A1c MFr Bld: 5.6 % of total Hgb (ref <5.7)
Mean Plasma Glucose: 114 mg/dL
eAG (mmol/L): 6.3 mmol/L

## 2021-09-11 ENCOUNTER — Other Ambulatory Visit: Payer: Self-pay | Admitting: Family Medicine

## 2021-09-11 ENCOUNTER — Other Ambulatory Visit: Payer: Self-pay

## 2021-09-11 ENCOUNTER — Ambulatory Visit (INDEPENDENT_AMBULATORY_CARE_PROVIDER_SITE_OTHER): Payer: Medicare HMO | Admitting: Family Medicine

## 2021-09-11 ENCOUNTER — Encounter: Payer: Self-pay | Admitting: Family Medicine

## 2021-09-11 VITALS — BP 143/62 | HR 81 | Ht 67.0 in | Wt 167.6 lb

## 2021-09-11 DIAGNOSIS — I10 Essential (primary) hypertension: Secondary | ICD-10-CM

## 2021-09-11 DIAGNOSIS — F419 Anxiety disorder, unspecified: Secondary | ICD-10-CM

## 2021-09-11 DIAGNOSIS — R7309 Other abnormal glucose: Secondary | ICD-10-CM

## 2021-09-11 DIAGNOSIS — M25511 Pain in right shoulder: Secondary | ICD-10-CM | POA: Diagnosis not present

## 2021-09-11 DIAGNOSIS — G8929 Other chronic pain: Secondary | ICD-10-CM

## 2021-09-11 DIAGNOSIS — Z0000 Encounter for general adult medical examination without abnormal findings: Secondary | ICD-10-CM

## 2021-09-11 DIAGNOSIS — E782 Mixed hyperlipidemia: Secondary | ICD-10-CM

## 2021-09-11 MED ORDER — LOSARTAN POTASSIUM 100 MG PO TABS: 100.0000 mg | ORAL_TABLET | Freq: Every day | ORAL | 3 refills | Status: DC

## 2021-09-11 MED ORDER — SERTRALINE HCL 100 MG PO TABS: 100.0000 mg | ORAL_TABLET | Freq: Every day | ORAL | 3 refills | Status: DC

## 2021-09-11 NOTE — Patient Instructions (Addendum)
Thank you for coming to the office today.  Check into weight loss injection medications  Saxenda (once daily injection)  Wegovy (once weekly injection)  We can do sample or discuss options at future visit in new year.  R Shoulder Injection next time.  Please schedule a Follow-up Appointment to: Return in about 1 month (around 10/11/2021) for 1 month R Shoulder Injection + Weight Management medication.  If you have any other questions or concerns, please feel free to call the office or send a message through [place]. You may also schedule an earlier appointment if necessary.  Additionally, you may be receiving a survey about your experience at our office within a few days to 1 week by e-mail or mail. We value your feedback.  Nobie Putnam, DO [place]

## 2021-09-11 NOTE — Telephone Encounter (Signed)
Requested medication (s) are due for refill today: Yes  Requested medication (s) are on the active medication list: Yes  Last refill:  09/08/20  Future visit scheduled: Today  Notes to clinic:  Prescription expired.    Requested Prescriptions  Pending Prescriptions Disp Refills   amLODipine (NORVASC) 10 MG tablet [Pharmacy Med Name: AMLODIPINE BESYLATE 10 MG TAB] 90 tablet 3    Sig: TAKE 1 TABLET BY MOUTH ONCE DAILY     Cardiovascular:  Calcium Channel Blockers Failed - 09/11/2021  8:54 AM      Failed - Last BP in normal range    BP Readings from Last 1 Encounters:  09/11/21 (!) 143/62          Failed - Valid encounter within last 6 months    Recent Outpatient Visits           Today Annual physical exam   [place], DO   6 months ago Elevated hemoglobin A1c   [place], DO   7 months ago Distended abdomen   [place], DO   1 year ago Mixed hyperlipidemia   [place], DO   1 year ago Urinary tract infection due to extended-spectrum beta lactamase (ESBL) producing Escherichia coli   [place], Devonne Doughty, [place]

## 2021-09-11 NOTE — Progress Notes (Signed)
Subjective:    Patient ID: Kristin Parker, female    DOB: 09/30/1951, 70 y.o.   MRN: [id number]  Kristin Parker is a 70 y.o. female presenting on 09/11/2021 for Annual Exam   HPI  Here for Annual Physical and Lab Review.  Elevated A1c Prior A1c 5.4 to 5.7 Now recent A1c to 5.6 Meds: Never on med Currently on ARB Lifestyle: - Diet (admits eats cookies and some sweets, not following low carb diet) - Exercise (limited) Denies hypoglycemia   CHRONIC HTN: Reports no concerns Current Meds - Losartan $RemoveBe'100mg'mBxowfXuJ$  daily, Amlodipine $RemoveBeforeDEI'10mg'rIOSluJGkzxgnoUZ$  daily Reports good compliance, took meds today. Tolerating well, w/o complaints. Denies CP, dyspnea, HA, edema, dizziness / lightheadedness   Hyperlipidemia Followed by [place] Failed multiple meds in past including statins intermittent statin, rosuvastatin, recently zetia. Had myalgia. History of myalgia on statin, with drug induced myopathy could not tolerate She is doing great on Repatha SureClick, now LDL down from >200 to 160 to 92     COPD / Tobacco Abuse Reports taking Spiriva 1 puff daily with good results. Still trying to reduce smoking, now mostly in AM and after meals     Anxiety / Insomnia Continues on Sertraline $RemoveBefor'100mg'pxuQlWggtEoV$  daily with good results. - needs refill   FOLLOW-UP Chronic R shoulder pain Prior history subacromial injection with improvement. Due for repeat now, she declines orthopedic Limited with range of motion above head Wants to delay surgery  S/p cholecystectomy - robotic surgery Done by Dr Hampton Abbot ASA    Health Maintenance:  Colonoscopy completed 05/15/21 by Dr Jonathon Bellows, had 11-12 polyps removed, next due repeat 1 year in 04/2022.  Has followed with OBGYN Philip Aspen CNM, she ordered Mammogram 02/2021.  Depression screen [facility] 2/9 09/11/2021 03/22/2021 03/08/2021  Decreased Interest 0 0 0  Down, Depressed, Hopeless 0 0 0  PHQ - 2 Score 0 0 0  Altered sleeping 1 - 1  Tired, decreased energy 0 -  0  Change in appetite 0 - 0  Feeling bad or failure about yourself  0 - 0  Trouble concentrating 0 - 0  Moving slowly or fidgety/restless 0 - 0  Suicidal thoughts 0 - 0  PHQ-9 Score 1 - 1  Difficult doing work/chores Somewhat difficult - Not difficult at all  Some recent data might be hidden    Past Medical History:  Diagnosis Date   Anxiety    Aortic valve sclerosis    Bilateral carotid bruits    Carotid artery stenosis    COPD (chronic obstructive pulmonary disease) (HCC)    Hypertension    Mitral regurgitation    Mixed hyperlipidemia    Shoulder pain    Sleeping difficulties    Past Surgical History:  Procedure Laterality Date   BREAST BIOPSY Left 08/13/2018   affirm bx, x clip, path pending   CATARACT EXTRACTION Left    2017   CHOLECYSTECTOMY     COLONOSCOPY  2012   Dr Candace Cruise   COLONOSCOPY WITH PROPOFOL N/A 05/15/2021   Procedure: COLONOSCOPY WITH PROPOFOL;  Surgeon: Jonathon Bellows, MD;  Location: [facility] ENDOSCOPY;  Service: Gastroenterology;  Laterality: N/A;   LEFT HEART CATH AND CORONARY ANGIOGRAPHY N/A 06/25/2017   Procedure: LEFT HEART CATH AND CORONARY ANGIOGRAPHY;  Surgeon: [name], MD;  Location: [facility];  Service: Cardiovascular;  Laterality: N/A;   Social History   Socioeconomic History   Marital status: Divorced    Spouse name: Not on file   Number  of children: Not on file   Years of education: Not on file   Highest education level: GED or equivalent  Occupational History   Occupation: retired   Tobacco Use   Smoking status: Former    Packs/day: 0.50    Years: 30.00    Pack years: 15.00    Types: Cigarettes    Quit date: 10/30/2020    Years since quitting: 0.8   Smokeless tobacco: Never   Tobacco comments:    not quite ready due to covid-19   Vaping Use   Vaping Use: Former  Substance and Sexual Activity   Alcohol use: No    Alcohol/week: 0.0 standard drinks   Drug use: No   Sexual activity: Not on file  Other Topics Concern    Not on file  Social History Narrative   Not on file   Social Determinants of Health   Financial Resource Strain: Not on file  Food Insecurity: Not on file  Transportation Needs: Not on file  Physical Activity: Not on file  Stress: Not on file  Social Connections: Not on file  Intimate Partner Violence: Not on file   Family History  Problem Relation Age of Onset   Cancer Mother        bladder cancer   Heart disease Mother 42       Pacemaker   Cancer Father        lung   Multiple sclerosis Sister    Valvular heart disease Brother 23       s/p bioprosthetic valve replacement at [facility]   Breast cancer Neg Hx    Current Outpatient Medications on File Prior to Visit  Medication Sig   acetaminophen (TYLENOL) 500 MG tablet Take 2 tablets (1,000 mg total) by mouth every 6 (six) hours as needed for mild pain.   albuterol (VENTOLIN HFA) 108 (90 Base) MCG/ACT inhaler INHALE 2 PUFFS EVERY 4 HOURS FOR WHEEZING OR SHORTNESS OF BREATH AS NEEDED   aspirin EC 81 MG tablet Take 1 tablet (81 mg total) by mouth daily.   fluticasone (FLONASE) 50 MCG/ACT nasal spray USE 2 SPRAYS INTO EACH NOSTRIL ONCE DAILY   hydrOXYzine (ATARAX/VISTARIL) 10 MG tablet Take 1 tablet (10 mg total) by mouth at bedtime as needed.   ibuprofen (ADVIL) 600 MG tablet Take 1 tablet (600 mg total) by mouth every 8 (eight) hours as needed for moderate pain.   meloxicam (MOBIC) 15 MG tablet TAKE 1 TABLET BY MOUTH ONCE DAILY AS NEEDED FOR PAIN WITH FOOD   SPIRIVA HANDIHALER 18 MCG inhalation capsule INHALE CONTENTS OF 1 CAPSULE ONCE DAILY   isosorbide mononitrate (IMDUR) 60 MG 24 hr tablet TAKE 1 TABLET BY MOUTH ONCE DAILY (Patient not taking: Reported on 09/11/2021)   REPATHA SURECLICK 419 MG/ML SOAJ Inject 1 Dose into the skin every 14 (fourteen) days.   No current facility-administered medications on file prior to visit.    Review of Systems  Constitutional:  Negative for activity change, appetite change, chills,  diaphoresis, fatigue and fever.  HENT:  Negative for congestion and hearing loss.   Eyes:  Negative for visual disturbance.  Respiratory:  Negative for cough, chest tightness, shortness of breath and wheezing.   Cardiovascular:  Negative for chest pain, palpitations and leg swelling.  Gastrointestinal:  Negative for abdominal pain, constipation, diarrhea, nausea and vomiting.  Genitourinary:  Negative for dysuria, frequency and hematuria.  Musculoskeletal:  Negative for arthralgias and neck pain.  Skin:  Negative for rash.  Neurological:  Negative for dizziness, weakness, light-headedness, numbness and headaches.  Hematological:  Negative for adenopathy.  Psychiatric/Behavioral:  Negative for behavioral problems, dysphoric mood and sleep disturbance.   Per HPI unless specifically indicated above      Objective:    BP (!) 143/62   Pulse 81   Ht 5\' 7"  (1.702 m)   Wt 167 lb 9.6 oz (76 kg)   SpO2 94%   BMI 26.25 kg/m   Wt Readings from Last 3 Encounters:  09/11/21 167 lb 9.6 oz (76 kg)  05/15/21 166 lb (75.3 kg)  04/10/21 168 lb (76.2 kg)    Physical Exam Vitals and nursing note reviewed.  Constitutional:      General: She is not in acute distress.    Appearance: She is well-developed. She is not diaphoretic.     Comments: Well-appearing, comfortable, cooperative  HENT:     Head: Normocephalic and atraumatic.  Eyes:     General:        Right eye: No discharge.        Left eye: No discharge.     Conjunctiva/sclera: Conjunctivae normal.     Pupils: Pupils are equal, round, and reactive to light.  Neck:     Thyroid: No thyromegaly.     Vascular: Carotid bruit (bilateral stable from prior) present.  Cardiovascular:     Rate and Rhythm: Normal rate and regular rhythm.     Pulses: Normal pulses.     Heart sounds: Murmur (+1 systolic murmur) heard.  Pulmonary:     Effort: Pulmonary effort is normal. No respiratory distress.     Breath sounds: Normal breath sounds. No  wheezing or rales.  Abdominal:     General: Bowel sounds are normal. There is no distension.     Palpations: Abdomen is soft. There is no mass.     Tenderness: There is no abdominal tenderness.  Musculoskeletal:        General: No tenderness. Normal range of motion.     Cervical back: Normal range of motion and neck supple.     Right lower leg: No edema.     Left lower leg: No edema.     Comments: Upper / Lower Extremities: - Normal muscle tone, strength bilateral upper extremities 5/5, lower extremities 5/5  Lymphadenopathy:     Cervical: No cervical adenopathy.  Skin:    General: Skin is warm and dry.     Findings: No erythema or rash.  Neurological:     Mental Status: She is alert and oriented to person, place, and time.     Comments: Distal sensation intact to light touch all extremities  Psychiatric:        Mood and Affect: Mood normal.        Behavior: Behavior normal.        Thought Content: Thought content normal.     Comments: Well groomed, good eye contact, normal speech and thoughts     Results for orders placed or performed in visit on 09/01/21  TSH  Result Value Ref Range   TSH 2.73 0.40 - 4.50 mIU/L  Lipid panel  Result Value Ref Range   Cholesterol 161 <200 mg/dL   HDL 37 (L) > OR = 50 mg/dL   Triglycerides 13/04/22 (H) <150 mg/dL   LDL Cholesterol (Calc) 92 mg/dL (calc)   Total CHOL/HDL Ratio 4.4 <5.0 (calc)   Non-HDL Cholesterol (Calc) 124 <130 mg/dL (calc)  COMPLETE METABOLIC PANEL WITH GFR  Result Value Ref Range  Glucose, Bld 101 (H) 65 - 99 mg/dL   BUN 12 7 - 25 mg/dL   Creat 0.82 0.60 - 1.00 mg/dL   eGFR 76 > OR = 60 mL/min/1.72m2   BUN/Creatinine Ratio NOT APPLICABLE 6 - 22 (calc)   Sodium 144 135 - 146 mmol/L   Potassium 3.7 3.5 - 5.3 mmol/L   Chloride 105 98 - 110 mmol/L   CO2 26 20 - 32 mmol/L   Calcium 9.1 8.6 - 10.4 mg/dL   Total Protein 6.7 6.1 - 8.1 g/dL   Albumin 4.2 3.6 - 5.1 g/dL   Globulin 2.5 1.9 - 3.7 g/dL (calc)   AG Ratio 1.7  1.0 - 2.5 (calc)   Total Bilirubin 0.3 0.2 - 1.2 mg/dL   Alkaline phosphatase (APISO) 123 37 - 153 U/L   AST 15 10 - 35 U/L   ALT 14 6 - 29 U/L  CBC with Differential/Platelet  Result Value Ref Range   WBC 7.8 3.8 - 10.8 Thousand/uL   RBC 4.92 3.80 - 5.10 Million/uL   Hemoglobin 14.9 11.7 - 15.5 g/dL   HCT 44.0 35.0 - 45.0 %   MCV 89.4 80.0 - 100.0 fL   MCH 30.3 27.0 - 33.0 pg   MCHC 33.9 32.0 - 36.0 g/dL   RDW 13.6 11.0 - 15.0 %   Platelets 339 140 - 400 Thousand/uL   MPV 9.9 7.5 - 12.5 fL   Neutro Abs 4,384 1,500 - 7,800 cells/uL   Lymphs Abs 2,512 850 - 3,900 cells/uL   Absolute Monocytes 499 200 - 950 cells/uL   Eosinophils Absolute 328 15 - 500 cells/uL   Basophils Absolute 78 0 - 200 cells/uL   Neutrophils Relative % 56.2 %   Total Lymphocyte 32.2 %   Monocytes Relative 6.4 %   Eosinophils Relative 4.2 %   Basophils Relative 1.0 %  Hemoglobin A1c  Result Value Ref Range   Hgb A1c MFr Bld 5.6 <5.7 % of total Hgb   Mean Plasma Glucose 114 mg/dL   eAG (mmol/L) 6.3 mmol/L      Assessment & Plan:   Problem List Items Addressed This Visit     Hyperlipidemia   Relevant Medications   REPATHA SURECLICK 768 MG/ML SOAJ   losartan (COZAAR) 100 MG tablet   Essential hypertension   Relevant Medications   REPATHA SURECLICK 115 MG/ML SOAJ   losartan (COZAAR) 100 MG tablet   Elevated hemoglobin A1c   Chronic right shoulder pain   Relevant Medications   sertraline (ZOLOFT) 100 MG tablet   Chronic anxiety   Relevant Medications   sertraline (ZOLOFT) 100 MG tablet   Other Visit Diagnoses     Annual physical exam    -  Primary       Updated Health Maintenance information Reviewed recent lab results with patient Encouraged improvement to lifestyle with diet and exercise Goal of weight loss  HTN Mild elevated SBP but overall controlled. Off med currently needs refill  Elevated A1c Not PreDM at this point  Chronic Anxiety Renew  Sertraline  Hyperlipidemia Controlled on Repatha sureclick injection  Meds ordered this encounter  Medications   losartan (COZAAR) 100 MG tablet    Sig: Take 1 tablet (100 mg total) by mouth daily.    Dispense:  90 tablet    Refill:  3    Add refills   sertraline (ZOLOFT) 100 MG tablet    Sig: Take 1 tablet (100 mg total) by mouth daily.    Dispense:  90 tablet    Refill:  3    Add refills       Follow up plan: Return in about 1 month (around 10/11/2021) for 1 month R Shoulder Injection + Weight Management medication.  Nobie Putnam, [facility] 09/11/2021, 2:39 PM

## 2021-09-28 ENCOUNTER — Telehealth: Payer: Self-pay | Admitting: Internal Medicine

## 2021-09-28 NOTE — Telephone Encounter (Signed)
PA Case: [id number], Status: Approved, Coverage Starts on: 10/29/2020 12:00:00 AM, Coverage Ends on: 10/28/2022 12:00:00 AM

## 2021-09-28 NOTE — Telephone Encounter (Signed)
PA for repatha sureclick submitted via CMM (Key: [id number])

## 2021-10-11 ENCOUNTER — Encounter: Payer: Self-pay | Admitting: Family Medicine

## 2021-10-11 ENCOUNTER — Other Ambulatory Visit: Payer: Self-pay

## 2021-10-11 ENCOUNTER — Ambulatory Visit (INDEPENDENT_AMBULATORY_CARE_PROVIDER_SITE_OTHER): Payer: Medicare HMO | Admitting: Family Medicine

## 2021-10-11 VITALS — BP 116/55 | HR 74 | Ht 67.0 in | Wt 168.0 lb

## 2021-10-11 DIAGNOSIS — R140 Abdominal distension (gaseous): Secondary | ICD-10-CM | POA: Diagnosis not present

## 2021-10-11 DIAGNOSIS — G8929 Other chronic pain: Secondary | ICD-10-CM

## 2021-10-11 DIAGNOSIS — M19011 Primary osteoarthritis, right shoulder: Secondary | ICD-10-CM | POA: Diagnosis not present

## 2021-10-11 DIAGNOSIS — M25511 Pain in right shoulder: Secondary | ICD-10-CM

## 2021-10-11 MED ORDER — LIDOCAINE HCL (PF) 1 % IJ SOLN
4.0000 mL | Freq: Once | INTRAMUSCULAR | Status: AC
Start: 2021-10-11 — End: 2021-10-11
  Administered 2021-10-11: 4 mL

## 2021-10-11 MED ORDER — METHYLPREDNISOLONE ACETATE 40 MG/ML IJ SUSP
40.0000 mg | Freq: Once | INTRAMUSCULAR | Status: AC
Start: 2021-10-11 — End: 2021-10-11
  Administered 2021-10-11: 40 mg via INTRA_ARTICULAR

## 2021-10-11 NOTE — Patient Instructions (Addendum)
Thank you for coming to the office today.  You received a Right Shoulder Joint steroid injection today. - Lidocaine numbing medicine may ease the pain initially for a few hours until it wears off - As discussed, you may experience a "steroid flare" this evening or within 24-48 hours, anytime medicine is injected into an inflamed joint it can cause the pain to get worse temporarily - Everyone responds differently to these injections, it depends on the patient and the severity of the joint problem, it may provide anywhere from days to weeks, to months of relief. Ideal response is >6 months relief - Try to take it easy for next 1-2 days, avoid over activity and strain on joint (limit lifting for shoulder) - Recommend the following:   - For swelling - rest, compression sleeve / ACE wrap, elevation, and ice packs as needed for first few days   - For pain in future may use heating pad or moist heat as needed  ----------------   OTC Peppermint Oil (Triple Coated Capsule) 180mg  take one 3 times daily to reduce bloating and gas production.  Gas-X as needed.  ----------------------  For Constipation (less frequent bowel movement that can be hard dry or involve straining).  Recommend trying OTC Miralax 17g = 1 capful in large glass water once daily for now, try several days to see if working, goal is soft stool or BM 1-2 times daily, if too loose then reduce dose or try every other day. If not effective may need to increase it to 2 doses at once in AM or may do 1 in morning and 1 in afternoon/evening  - This medicine is very safe and can be used often without any problem and will not make you dehydrated. It is good for use on AS NEEDED BASIS or even MAINTENANCE therapy for longer term for several days to weeks at a time to help regulate bowel movements  Other more natural remedies or preventative treatment: - Increase hydration with water - Increase fiber in diet (high fiber foods = vegetables,  leafy greens, oats/grains) - May take OTC Fiber supplement (metamucil powder or pill/gummy) - May try OTC Probiotic   Please schedule a Follow-up Appointment to: Return in about 3 months (around 01/09/2022), or if symptoms worsen or fail to improve, for 3 month as needed abdominal distention.  If you have any other questions or concerns, please feel free to call the office or send a message through [place]. You may also schedule an earlier appointment if necessary.  Additionally, you may be receiving a survey about your experience at our office within a few days to 1 week by e-mail or mail. We value your feedback.  Nobie Putnam, DO [place]

## 2021-10-11 NOTE — Progress Notes (Signed)
Subjective:    Patient ID: Kristin Parker, female    DOB: [birth year], 70 y.o.   MRN: [id number]  Kristin Parker is a 70 y.o. female presenting on 10/11/2021 for Shoulder Pain and weight management   HPI  Chronic R Shoulder pain Last injection in R shoulder 01/2021, good results. She has had total 5 cortisone injections in R shoulder over past 3+ years. She has seen Ortho in past and they recommend surgery but she has declined surgery for now. Limited with range of motion above head Ready for repeat injection  Chronic Abdominal Distention Overweight BMI >26 She asks about obesity treatment today however.  Admits no constipation technically, she has bowel movement every other day, soft not hard or dry, not straining but does have cramping and gas production. She admits bloating gas She has seen General Surgery and had CT no other etiology identified. Saw GI for Colonoscopy repeat in 1 year but otherwise no clear cause.    Depression screen [facility] 2/9 09/11/2021 03/22/2021 03/08/2021  Decreased Interest 0 0 0  Down, Depressed, Hopeless 0 0 0  PHQ - 2 Score 0 0 0  Altered sleeping 1 - 1  Tired, decreased energy 0 - 0  Change in appetite 0 - 0  Feeling bad or failure about yourself  0 - 0  Trouble concentrating 0 - 0  Moving slowly or fidgety/restless 0 - 0  Suicidal thoughts 0 - 0  PHQ-9 Score 1 - 1  Difficult doing work/chores Somewhat difficult - Not difficult at all  Some recent data might be hidden    Social History   Tobacco Use   Smoking status: Former    Packs/day: 0.50    Years: 30.00    Pack years: 15.00    Types: Cigarettes    Quit date: 10/30/2020    Years since quitting: 0.9   Smokeless tobacco: Never   Tobacco comments:    not quite ready due to covid-19   Vaping Use   Vaping Use: Former  Substance Use Topics   Alcohol use: No    Alcohol/week: 0.0 standard drinks   Drug use: No    Review of Systems Per HPI unless specifically indicated above      Objective:    BP (!) 116/55    Pulse 74    Ht _0  (1.702 m)    Wt 168 lb (76.2 kg)    SpO2 94%    BMI 26.31 kg/m   Wt Readings from Last 3 Encounters:  10/11/21 168 lb (76.2 kg)  09/11/21 167 lb 9.6 oz (76 kg)  05/15/21 166 lb (75.3 kg)    Physical Exam Vitals and nursing note reviewed.  Constitutional:      General: She is not in acute distress.    Appearance: Normal appearance. She is well-developed. She is not diaphoretic.     Comments: Well-appearing, comfortable, cooperative  HENT:     Head: Normocephalic and atraumatic.  Eyes:     General:        Right eye: No discharge.        Left eye: No discharge.     Conjunctiva/sclera: Conjunctivae normal.  Cardiovascular:     Rate and Rhythm: Normal rate.  Pulmonary:     Effort: Pulmonary effort is normal.  Abdominal:     General: There is distension.     Palpations: There is no mass.     Tenderness: There is no abdominal tenderness. There is no guarding.  Hernia: No hernia is present.     Comments: Reduced bowel sounds  Skin:    General: Skin is warm and dry.     Findings: No erythema or rash.  Neurological:     Mental Status: She is alert and oriented to person, place, and time.  Psychiatric:        Mood and Affect: Mood normal.        Behavior: Behavior normal.        Thought Content: Thought content normal.     Comments: Well groomed, good eye contact, normal speech and thoughts    ________________________________________________________ PROCEDURE NOTE Date: 10/11/21 R Shoulder injection Discussed benefits and risks (including pain, bleeding, infection, steroid flare). Verbal consent given by patient. Medication:  1 cc Depo-medrol 45m and 4 cc Lidocaine 1% without epi Time Out taken  Landmarks identified. Area cleansed with alcohol wipes. Using 21 gauge and 1, 1/2 inch needle, Right subacromial bursa space was injected (with above listed medication) via posterior approach cold spray used for superficial  anesthetic. Sterile bandage placed. Patient tolerated procedure well without bleeding or paresthesias. No complications.    Results for orders placed or performed in visit on 09/01/21  TSH  Result Value Ref Range   TSH 2.73 0.40 - 4.50 mIU/L  Lipid panel  Result Value Ref Range   Cholesterol 161 <200 mg/dL   HDL 37 (L) > OR = 50 mg/dL   Triglycerides 230 (H) <150 mg/dL   LDL Cholesterol (Calc) 92 mg/dL (calc)   Total CHOL/HDL Ratio 4.4 <5.0 (calc)   Non-HDL Cholesterol (Calc) 124 <130 mg/dL (calc)  COMPLETE METABOLIC PANEL WITH GFR  Result Value Ref Range   Glucose, Bld 101 (H) 65 - 99 mg/dL   BUN 12 7 - 25 mg/dL   Creat 0.82 0.60 - 1.00 mg/dL   eGFR 76 > OR = 60 mL/min/1.728m  BUN/Creatinine Ratio NOT APPLICABLE 6 - 22 (calc)   Sodium 144 135 - 146 mmol/L   Potassium 3.7 3.5 - 5.3 mmol/L   Chloride 105 98 - 110 mmol/L   CO2 26 20 - 32 mmol/L   Calcium 9.1 8.6 - 10.4 mg/dL   Total Protein 6.7 6.1 - 8.1 g/dL   Albumin 4.2 3.6 - 5.1 g/dL   Globulin 2.5 1.9 - 3.7 g/dL (calc)   AG Ratio 1.7 1.0 - 2.5 (calc)   Total Bilirubin 0.3 0.2 - 1.2 mg/dL   Alkaline phosphatase (APISO) 123 37 - 153 U/L   AST 15 10 - 35 U/L   ALT 14 6 - 29 U/L  CBC with Differential/Platelet  Result Value Ref Range   WBC 7.8 3.8 - 10.8 Thousand/uL   RBC 4.92 3.80 - 5.10 Million/uL   Hemoglobin 14.9 11.7 - 15.5 g/dL   HCT 44.0 35.0 - 45.0 %   MCV 89.4 80.0 - 100.0 fL   MCH 30.3 27.0 - 33.0 pg   MCHC 33.9 32.0 - 36.0 g/dL   RDW 13.6 11.0 - 15.0 %   Platelets 339 140 - 400 Thousand/uL   MPV 9.9 7.5 - 12.5 fL   Neutro Abs 4,384 1,500 - 7,800 cells/uL   Lymphs Abs 2,512 850 - 3,900 cells/uL   Absolute Monocytes 499 200 - 950 cells/uL   Eosinophils Absolute 328 15 - 500 cells/uL   Basophils Absolute 78 0 - 200 cells/uL   Neutrophils Relative % 56.2 %   Total Lymphocyte 32.2 %   Monocytes Relative 6.4 %   Eosinophils Relative 4.2 %  Basophils Relative 1.0 %  Hemoglobin A1c  Result Value Ref  Range   Hgb A1c MFr Bld 5.6 <5.7 % of total Hgb   Mean Plasma Glucose 114 mg/dL   eAG (mmol/L) 6.3 mmol/L      Assessment & Plan:   Problem List Items Addressed This Visit     Osteoarthritis of glenohumeral joint, right - Primary   Chronic right shoulder pain   Other Visit Diagnoses     Distended abdomen           Abdominal distention Gas Bloating >8-9 months problem still Constipation still present but has BMs more regular now Last CT reviewed, had moderate stool burden.  Colonoscopy reviewed No prior abdominal surgery She has seen Gen Surgery already, no biliary etiology Normal labs chemistry Does have mild low Albumin 3.4, could be related  Restart Miralax daily and use as preventative OTC Peppermint Oil (Triple Coated Capsule) 184m take one 3 times daily to reduce diarrhea Gas-X for now We discussed this does not seem like abdominal fat or obesity - based on exam and also history, her weight is < 30 BMI, not qualify for GLP1   Future reconsider GI consultation if still unable to identify problem.   --------------    Consistent with recurrent chronic R-shoulder pain, gradual worsening Secondary underlying moderate glenohumeral and AC arthritis and likely some bursitis flare Concern for possible adhesive capsulitis given significant limited range of motion Cannot rule out rotator cuff tendinopathy - likely given degenerative symptoms - may need advanced imaging Known arthritis other joints X-ray results above   Plan: Right shoulder subacromial steroid injection performed today, see procedure note for details. #6 steroid injection now since 2019  NSAID PRN Tylenol PRN ROM exercises  As advised before, she has ortho and they recommend surgery, she declines surgery and wants to delay and defer.    Meds ordered this encounter  Medications   methylPREDNISolone acetate (DEPO-MEDROL) injection 40 mg   lidocaine (PF) (XYLOCAINE) 1 % injection 4 mL       Follow up plan: Return in about 3 months (around 01/09/2022), or if symptoms worsen or fail to improve, for 3 month as needed abdominal distention.   ANobie Putnam [facility] 10/11/2021, 9:46 AM

## 2021-10-30 ENCOUNTER — Other Ambulatory Visit: Payer: Self-pay | Admitting: Family Medicine

## 2021-10-30 DIAGNOSIS — J432 Centrilobular emphysema: Secondary | ICD-10-CM

## 2021-10-31 NOTE — Telephone Encounter (Signed)
Requested Prescriptions  Pending Prescriptions Disp Refills   albuterol (VENTOLIN HFA) 108 (90 Base) MCG/ACT inhaler [Pharmacy Med Name: ALBUTEROL SULFATE HFA 108 (90 BASE)] 18 g 9    Sig: INHALE 2 PUFFS EVERY 4 HOURS FOR WHEEZING OR SHORTNESS OF BREATH AS NEEDED     Pulmonology:  Beta Agonists Failed - 10/30/2021  9:26 AM      Failed - One inhaler should last at least one month. If the patient is requesting refills earlier, contact the patient to check for uncontrolled symptoms.      Passed - Valid encounter within last 12 months    Recent Outpatient Visits          2 weeks ago Osteoarthritis of glenohumeral joint, right   [place], DO   1 month ago Annual physical exam   [place], DO   7 months ago Elevated hemoglobin A1c   [place], DO   8 months ago Distended abdomen   [place], DO   1 year ago Mixed hyperlipidemia   [place], Devonne Doughty, [place]

## 2021-11-03 IMAGING — CT CT HEART MORP W/ CTA COR W/ SCORE W/ CA W/CM &/OR W/O CM
1 of 13 series · 4 of 20 positions shown, 5 images · non-contrast
Comparison: No comparison studies available.

Addendum:
CLINICAL DATA: Chestpain

EXAM:
Cardiac/Coronary  CTA
TECHNIQUE: The patient was scanned on a Siemens Somatoform go.Top scanner.

[Series 44: ms multiphase cta coronary 0.60 · axial · 0.34mm/px · z∈[-1100,-1022]mm · 4 of 2907 slices shown, 5 images]
[im 582/2907  vessel]
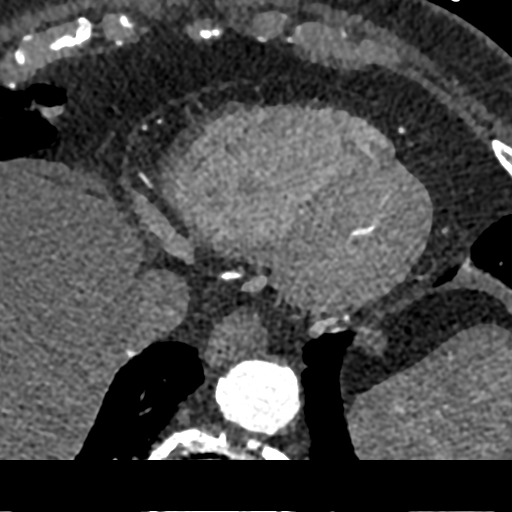
[im 582/2907  lung]
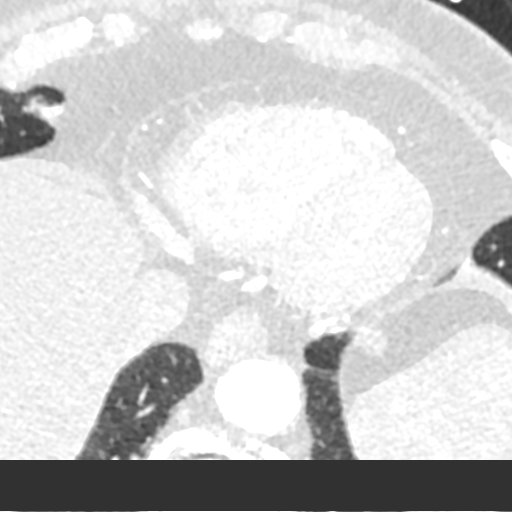
[im 1163/2907  vessel]
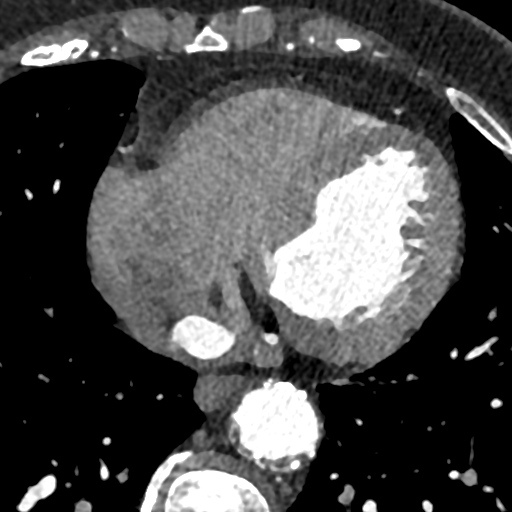
[im 1744/2907  vessel]
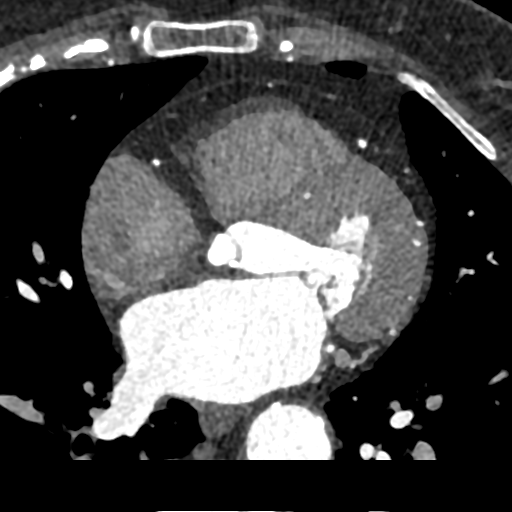
[im 2325/2907  vessel]
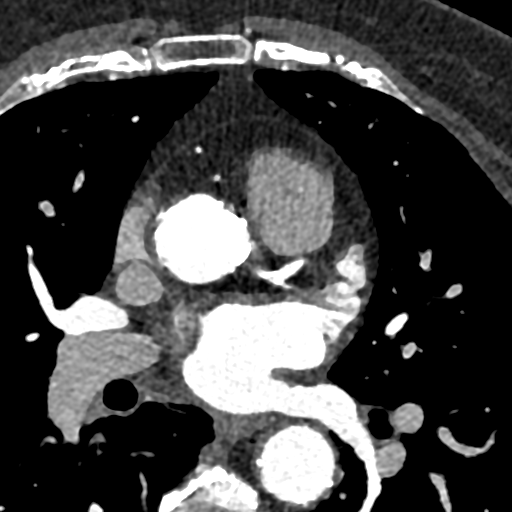

[4 of 20 positions shown; findings below may reference images not displayed]

FINDINGS: A retrospective scan was triggered in the descending thoracic aorta.
Axial non-contrast 3 mm slices were carried out through the heart.
The data set was analyzed on a dedicated work station and scored
using the Agatson method. Gantry rotation speed was 330 msecs and
collimation was .6 mm. 50mg of metoprolol and 0.8 mg of sl NTG was
given. The 3D data set was reconstructed in 5% intervals of the
50-95 % of the R-R cycle. Diastolic phases were analyzed on a
dedicated work station using MPR, MIP and VRT modes. The patient
received 75 cc of contrast.

Aorta: Normal size. Moderate ascending and descending aortic wall
calcifications, Grade 3 atheroma noted the the thoracic descending
aorta. No dissection.

Aortic Valve:  Trileaflet.  mild calcifications.

Coronary Arteries:  Normal coronary origin.  Left dominance.

RCA is a small non dominant artery.  There is no plaque.

Left main is a large, short artery that gives rise to LAD and LCX
arteries. There is calcified plaque in the proximal LM causing mild
(25%) stenosis

LAD is a large vessel that wraps around the apex. it has mild
calcified plaque in the proximal segment causing minimal (<25%)
stenosis.

LCX is a dominant artery that gives rise to 3 obtuse marginal
branches before branching into PDA and PLA. There is mild calcified
plaque in the proximal segment causing mild stenosis (25-49%).

Other findings:

Normal pulmonary vein drainage into the left atrium.

Normal left atrial appendage without a thrombus.

Normal size of the pulmonary artery.
IMPRESSION: 1. Coronary calcium score of 192. This was 79th percentile for age
and sex matched control.

2. Normal coronary origin with left dominance.

3. Calcified plaque causing mild stenosis in the proximal LM and LCx

4. Moderate ascending and descending aortic wall calcifications

5. Grade 3 atheroma in the descending thoracic aorta

6. CAD-RADS 2. Mild non-obstructive CAD (25-49%). Consider
non-atherosclerotic causes of chest pain. Consider preventive
therapy and risk factor modification. Recommend asa, statin if no
contraindication.

ADDENDUM:
EXAM:
OVER-READ INTERPRETATION CT CHEST

The following report is an over-read performed by radiologist Dr.
Tejaun [[email or web address]] on 07/27/2015. This over-read
does not include interpretation of cardiac or coronary anatomy or
pathology. The coronary calcium score/coronary CTA interpretation by
the cardiologist is attached.
FINDINGS: Atherosclerotic calcification is noted in the wall of the thoracic
aorta. No lymphadenopathy identified within the visualized
mediastinum and hilar regions.

No suspicious pulmonary nodule or mass within the visualized lung
parenchyma. There is some trace atelectasis or scarring in the
lingula. No worrisome lytic or sclerotic osseous abnormality.
IMPRESSION: No acute or clinically significant abnormality identified within the
extra cardiac anatomy of the visualized chest.

*** End of Addendum ***
FINDINGS: A retrospective scan was triggered in the descending thoracic aorta.
Axial non-contrast 3 mm slices were carried out through the heart.
The data set was analyzed on a dedicated work station and scored
using the Agatson method. Gantry rotation speed was 330 msecs and
collimation was .6 mm. 50mg of metoprolol and 0.8 mg of sl NTG was
given. The 3D data set was reconstructed in 5% intervals of the
50-95 % of the R-R cycle. Diastolic phases were analyzed on a
dedicated work station using MPR, MIP and VRT modes. The patient
received 75 cc of contrast.

Aorta: Normal size. Moderate ascending and descending aortic wall
calcifications, Grade 3 atheroma noted the the thoracic descending
aorta. No dissection.

Aortic Valve:  Trileaflet.  mild calcifications.

Coronary Arteries:  Normal coronary origin.  Left dominance.

RCA is a small non dominant artery.  There is no plaque.

Left main is a large, short artery that gives rise to LAD and LCX
arteries. There is calcified plaque in the proximal LM causing mild
(25%) stenosis

LAD is a large vessel that wraps around the apex. it has mild
calcified plaque in the proximal segment causing minimal (<25%)
stenosis.

LCX is a dominant artery that gives rise to 3 obtuse marginal
branches before branching into PDA and PLA. There is mild calcified
plaque in the proximal segment causing mild stenosis (25-49%).

Other findings:

Normal pulmonary vein drainage into the left atrium.

Normal left atrial appendage without a thrombus.

Normal size of the pulmonary artery.
IMPRESSION: 1. Coronary calcium score of 192. This was 79th percentile for age
and sex matched control.

2. Normal coronary origin with left dominance.

3. Calcified plaque causing mild stenosis in the proximal LM and LCx

4. Moderate ascending and descending aortic wall calcifications

5. Grade 3 atheroma in the descending thoracic aorta

6. CAD-RADS 2. Mild non-obstructive CAD (25-49%). Consider
non-atherosclerotic causes of chest pain. Consider preventive
therapy and risk factor modification. Recommend asa, statin if no
contraindication.

## 2021-11-17 ENCOUNTER — Telehealth: Payer: Self-pay | Admitting: Family Medicine

## 2021-11-17 NOTE — Telephone Encounter (Signed)
N/A unable to leave a message for patient to call back and schedule the Medicare Annual Wellness Visit (AWV) virtually or by telephone.  Last AWV 07/07/19  Please schedule at anytime with [place].  40 minute appointment  Any questions, please call me at [phone]

## 2021-11-27 ENCOUNTER — Other Ambulatory Visit: Payer: Self-pay | Admitting: Internal Medicine

## 2021-11-27 ENCOUNTER — Other Ambulatory Visit: Payer: Self-pay | Admitting: Family Medicine

## 2021-11-27 DIAGNOSIS — J432 Centrilobular emphysema: Secondary | ICD-10-CM

## 2021-11-27 DIAGNOSIS — J3089 Other allergic rhinitis: Secondary | ICD-10-CM

## 2021-11-27 DIAGNOSIS — G8929 Other chronic pain: Secondary | ICD-10-CM

## 2021-11-27 NOTE — Telephone Encounter (Signed)
Needs appointment for refills. Cancelled last scheduled office visit. (Overdue) Thank you!

## 2021-11-27 NOTE — Telephone Encounter (Signed)
Requested medication (s) are due for refill today: yes  Requested medication (s) are on the active medication list: yes  Last refill:  flonase and Meloxicam 05/26/21, Spiriva 08/26/21  Future visit scheduled: No  Notes to clinic:  Unable to refill per protocol, medication not assigned to the refill protocol.      Requested Prescriptions  Pending Prescriptions Disp Refills   SPIRIVA HANDIHALER 18 MCG inhalation capsule [Pharmacy Med Name: SPIRIVA HANDIHALER 18 MCG INH CAP] 30 capsule 2    Sig: INHALE CONTENTS OF 1 CAPSULE ONCE DAILY     There is no refill protocol information for this order     meloxicam (MOBIC) 15 MG tablet [Pharmacy Med Name: MELOXICAM 15 MG TAB] 30 tablet 5    Sig: TAKE 1 TABLET BY MOUTH ONCE DAILY AS NEEDED FOR PAIN WITH FOOD     There is no refill protocol information for this order     fluticasone (FLONASE) 50 MCG/ACT nasal spray [Pharmacy Med Name: FLUTICASONE PROPIONATE 50 MCG/ACT N] 16 g 5    Sig: USE 2 SPRAYS INTO EACH NOSTRIL ONCE DAILY     There is no refill protocol information for this order

## 2022-01-05 ENCOUNTER — Ambulatory Visit (INDEPENDENT_AMBULATORY_CARE_PROVIDER_SITE_OTHER): Payer: Medicare HMO | Admitting: Family Medicine

## 2022-01-05 ENCOUNTER — Other Ambulatory Visit: Payer: Self-pay

## 2022-01-05 ENCOUNTER — Ambulatory Visit (INDEPENDENT_AMBULATORY_CARE_PROVIDER_SITE_OTHER): Payer: Medicare HMO

## 2022-01-05 ENCOUNTER — Encounter: Payer: Self-pay | Admitting: Family Medicine

## 2022-01-05 VITALS — BP 120/60 | HR 82 | Ht 67.0 in | Wt 170.0 lb

## 2022-01-05 VITALS — BP 120/60 | HR 82 | Temp 97.9°F | Ht 67.0 in | Wt 170.8 lb

## 2022-01-05 DIAGNOSIS — R051 Acute cough: Secondary | ICD-10-CM | POA: Diagnosis not present

## 2022-01-05 DIAGNOSIS — J441 Chronic obstructive pulmonary disease with (acute) exacerbation: Secondary | ICD-10-CM | POA: Diagnosis not present

## 2022-01-05 DIAGNOSIS — R0602 Shortness of breath: Secondary | ICD-10-CM

## 2022-01-05 DIAGNOSIS — Z0000 Encounter for general adult medical examination without abnormal findings: Secondary | ICD-10-CM | POA: Diagnosis not present

## 2022-01-05 MED ORDER — PREDNISONE 20 MG PO TABS: ORAL_TABLET | ORAL | 0 refills | Status: DC

## 2022-01-05 MED ORDER — LEVOFLOXACIN 500 MG PO TABS: 500.0000 mg | ORAL_TABLET | Freq: Every day | ORAL | 0 refills | Status: DC

## 2022-01-05 MED ORDER — HYDROCOD POLI-CHLORPHE POLI ER 10-8 MG/5ML PO SUER: 5.0000 mL | Freq: Two times a day (BID) | ORAL | 0 refills | Status: DC | PRN

## 2022-01-05 NOTE — Progress Notes (Signed)
? ?Subjective:  ? ? Patient ID: Kristin Parker, female    DOB: 10/10/1951, 70 y.o.   MRN: [id number] ? ?Kristin Parker is a 71 y.o. female presenting on 01/05/2022 for Cough and Shortness of Breath ? ?Patient has also seen Dionisio David, LPN for [facility] ? ?Patient presents for a same day appointment. ? ? ?HPI ? ?COPD EXACERBATION ?Reports recent onset 2-3 days worse cough with wheezing short of breath inc thick sputum production. No recent antibiotic. She had sore throat and some rib aching with coughing spells lately. Has albuterol PRN some relief. In past has used cough medicine and steroid as well for relief. ? ? ?Depression screen [facility] 2/9 01/05/2022 09/11/2021 03/22/2021  ?Decreased Interest 0 0 0  ?Down, Depressed, Hopeless 0 0 0  ?PHQ - 2 Score 0 0 0  ?Altered sleeping - 1 -  ?Tired, decreased energy - 0 -  ?Change in appetite - 0 -  ?Feeling bad or failure about yourself  - 0 -  ?Trouble concentrating - 0 -  ?Moving slowly or fidgety/restless - 0 -  ?Suicidal thoughts - 0 -  ?PHQ-9 Score - 1 -  ?Difficult doing work/chores - Somewhat difficult -  ?Some recent data might be hidden  ? ? ?Social History  ? ?Tobacco Use  ? Smoking status: Former  ?  Packs/day: 0.50  ?  Years: 30.00  ?  Pack years: 15.00  ?  Types: Cigarettes  ?  Quit date: 10/30/2020  ?  Years since quitting: 1.1  ? Smokeless tobacco: Never  ? Tobacco comments:  ?  not quite ready due to covid-19   ?Vaping Use  ? Vaping Use: Former  ?Substance Use Topics  ? Alcohol use: No  ?  Alcohol/week: 0.0 standard drinks  ? Drug use: No  ? ? ?Review of Systems ?Per HPI unless specifically indicated above ? ?   ?Objective:  ?  ?BP 120/60   Pulse 82   Ht $R'5\' 7"'Wu$  (1.702 m)   Wt 170 lb (77.1 kg)   SpO2 90%   BMI 26.63 kg/m?   ?Wt Readings from Last 3 Encounters:  ?01/05/22 170 lb (77.1 kg)  ?01/05/22 170 lb 12.8 oz (77.5 kg)  ?10/11/21 168 lb (76.2 kg)  ?  ?Physical Exam ?Vitals and nursing note reviewed.  ?Constitutional:   ?   General: She is not in acute  distress. ?   Appearance: She is well-developed. She is not diaphoretic.  ?   Comments: Well-appearing, comfortable, cooperative  ?HENT:  ?   Head: Normocephalic and atraumatic.  ?Eyes:  ?   General:     ?   Right eye: No discharge.     ?   Left eye: No discharge.  ?   Conjunctiva/sclera: Conjunctivae normal.  ?Neck:  ?   Thyroid: No thyromegaly.  ?Cardiovascular:  ?   Rate and Rhythm: Normal rate and regular rhythm.  ?   Heart sounds: Normal heart sounds. No murmur heard. ?Pulmonary:  ?   Effort: Pulmonary effort is normal. No respiratory distress.  ?   Breath sounds: Examination of the right-upper field reveals decreased breath sounds and wheezing. Examination of the left-upper field reveals decreased breath sounds and wheezing. Examination of the right-middle field reveals decreased breath sounds and wheezing. Examination of the left-middle field reveals decreased breath sounds and wheezing. Examination of the right-lower field reveals decreased breath sounds and wheezing. Examination of the left-lower field reveals decreased breath sounds and wheezing. Decreased breath  sounds and wheezing present. No rales.  ?Musculoskeletal:     ?   General: Normal range of motion.  ?   Cervical back: Normal range of motion and neck supple.  ?Lymphadenopathy:  ?   Cervical: No cervical adenopathy.  ?Skin: ?   General: Skin is warm and dry.  ?   Findings: No erythema or rash.  ?Neurological:  ?   Mental Status: She is alert and oriented to person, place, and time.  ?Psychiatric:     ?   Behavior: Behavior normal.  ?   Comments: Well groomed, good eye contact, normal speech and thoughts  ? ?Results for orders placed or performed in visit on 09/01/21  ?TSH  ?Result Value Ref Range  ? TSH 2.73 0.40 - 4.50 mIU/L  ?Lipid panel  ?Result Value Ref Range  ? Cholesterol 161 <200 mg/dL  ? HDL 37 (L) > OR = 50 mg/dL  ? Triglycerides 230 (H) <150 mg/dL  ? LDL Cholesterol (Calc) 92 mg/dL (calc)  ? Total CHOL/HDL Ratio 4.4 <5.0 (calc)  ?  Non-HDL Cholesterol (Calc) 124 <130 mg/dL (calc)  ?COMPLETE METABOLIC PANEL WITH GFR  ?Result Value Ref Range  ? Glucose, Bld 101 (H) 65 - 99 mg/dL  ? BUN 12 7 - 25 mg/dL  ? Creat 0.82 0.60 - 1.00 mg/dL  ? eGFR 76 > OR = 60 mL/min/1.18m2  ? BUN/Creatinine Ratio NOT APPLICABLE 6 - 22 (calc)  ? Sodium 144 135 - 146 mmol/L  ? Potassium 3.7 3.5 - 5.3 mmol/L  ? Chloride 105 98 - 110 mmol/L  ? CO2 26 20 - 32 mmol/L  ? Calcium 9.1 8.6 - 10.4 mg/dL  ? Total Protein 6.7 6.1 - 8.1 g/dL  ? Albumin 4.2 3.6 - 5.1 g/dL  ? Globulin 2.5 1.9 - 3.7 g/dL (calc)  ? AG Ratio 1.7 1.0 - 2.5 (calc)  ? Total Bilirubin 0.3 0.2 - 1.2 mg/dL  ? Alkaline phosphatase (APISO) 123 37 - 153 U/L  ? AST 15 10 - 35 U/L  ? ALT 14 6 - 29 U/L  ?CBC with Differential/Platelet  ?Result Value Ref Range  ? WBC 7.8 3.8 - 10.8 Thousand/uL  ? RBC 4.92 3.80 - 5.10 Million/uL  ? Hemoglobin 14.9 11.7 - 15.5 g/dL  ? HCT 44.0 35.0 - 45.0 %  ? MCV 89.4 80.0 - 100.0 fL  ? MCH 30.3 27.0 - 33.0 pg  ? MCHC 33.9 32.0 - 36.0 g/dL  ? RDW 13.6 11.0 - 15.0 %  ? Platelets 339 140 - 400 Thousand/uL  ? MPV 9.9 7.5 - 12.5 fL  ? Neutro Abs 4,384 1,500 - 7,800 cells/uL  ? Lymphs Abs 2,512 850 - 3,900 cells/uL  ? Absolute Monocytes 499 200 - 950 cells/uL  ? Eosinophils Absolute 328 15 - 500 cells/uL  ? Basophils Absolute 78 0 - 200 cells/uL  ? Neutrophils Relative % 56.2 %  ? Total Lymphocyte 32.2 %  ? Monocytes Relative 6.4 %  ? Eosinophils Relative 4.2 %  ? Basophils Relative 1.0 %  ?Hemoglobin A1c  ?Result Value Ref Range  ? Hgb A1c MFr Bld 5.6 <5.7 % of total Hgb  ? Mean Plasma Glucose 114 mg/dL  ? eAG (mmol/L) 6.3 mmol/L  ? ?   ?Assessment & Plan:  ? ?Problem List Items Addressed This Visit   ?None ?Visit Diagnoses   ? ? COPD with acute exacerbation ([place])    -  Primary  ? Relevant Medications  ? levofloxacin (LEVAQUIN) 500  MG tablet  ? predniSONE (DELTASONE) 20 MG tablet  ? chlorpheniramine-HYDROcodone (TUSSIONEX PENNKINETIC ER) 10-8 MG/5ML  ? Other Relevant Orders  ? COVID-19,  Flu A+B and RSV  ? Shortness of breath      ? Relevant Orders  ? COVID-19, Flu A+B and RSV  ? Acute cough      ? Relevant Medications  ? chlorpheniramine-HYDROcodone (TUSSIONEX PENNKINETIC ER) 10-8 MG/5ML  ? Other Relevant Orders  ? COVID-19, Flu A+B and RSV  ? ?  ?  ?Consistent with mild to moderate acute exacerbation of COPD with worsening productive cough. Similar to prior exacerbations ? ?- No hypoxia (90% on RA), afebrile, no recent hospitalization ? ?Plan: ?Start taking Levaquin antibiotic 500mg  daily x 7 days ?Start Prednisone 7 day taper ?Continue SPiriva, Albuterol PRN ?Rx tussionex cough syrup PRN ? ?Future consider CXR ?RTC about 1 week if not improving, otherwise strict return criteria to go to ED ? ? ? ?Meds ordered this encounter  ?Medications  ? levofloxacin (LEVAQUIN) 500 MG tablet  ?  Sig: Take 1 tablet (500 mg total) by mouth daily. For 7 days  ?  Dispense:  7 tablet  ?  Refill:  0  ? predniSONE (DELTASONE) 20 MG tablet  ?  Sig: Take daily with food. Start with 60mg  (3 pills) x 2 days, then reduce to 40mg  (2 pills) x 2 days, then 20mg  (1 pill) x 3 days  ?  Dispense:  13 tablet  ?  Refill:  0  ? chlorpheniramine-HYDROcodone (TUSSIONEX PENNKINETIC ER) 10-8 MG/5ML  ?  Sig: Take 5 mLs by mouth every 12 (twelve) hours as needed for cough.  ?  Dispense:  115 mL  ?  Refill:  0  ? ? ? ? ?Follow up plan: ?Return in about 1 week (around 01/12/2022), or if symptoms worsen or fail to improve. ? ? ?Nobie Putnam, DO ?[facility] ?01/05/2022, 10:47 AM ?

## 2022-01-05 NOTE — Patient Instructions (Addendum)
Thank you for coming to the office today. ? ?Start taking Levaquin antibiotic '500mg'$  daily x 7 days ? ?Prednisone 7 day taper ? ?COVID FLU RSV test today pending result 1-3 days approx ? ?Use ALbuterol rescue inhaler 2 puffs every 4 hours approximately ? ?If not improving we can X-ray or you can go to urgent care / ED over weekend ? ?TUssionex cough syrup as needed ? ?Please schedule a Follow-up Appointment to: Return in about 1 week (around 01/12/2022), or if symptoms worsen or fail to improve. ? ?If you have any other questions or concerns, please feel free to call the office or send a message through [place]. You may also schedule an earlier appointment if necessary. ? ?Additionally, you may be receiving a survey about your experience at our office within a few days to 1 week by e-mail or mail. We value your feedback. ? ?Nobie Putnam, DO ?[facility] ?

## 2022-01-05 NOTE — Progress Notes (Signed)
Subjective:   Kristin Parker is a 71 y.o. female who presents for Medicare Annual (Subsequent) preventive examination.  Review of Systems           Objective:    Today's Vitals   01/05/22 1022  BP: 120/60  Pulse: 82  Temp: 97.9 F (36.6 C)  TempSrc: Oral  SpO2: 90%  Weight: 170 lb 12.8 oz (77.5 kg)  Height: '5\' 7"'$  (1.702 m)   Body mass index is 26.75 kg/m.  Advanced Directives 05/15/2021 02/18/2021 02/18/2021 07/07/2019 06/24/2018 06/25/2017 05/14/2017  Does Patient Have a Medical Advance Directive? No - No No No No No  Would patient like information on creating a medical advance directive? - No - Patient declined - - Yes (MAU/Ambulatory/Procedural Areas - Information given) No - Patient declined Yes (MAU/Ambulatory/Procedural Areas - Information given)    Current Medications (verified) Outpatient Encounter Medications as of 01/05/2022  Medication Sig   acetaminophen (TYLENOL) 500 MG tablet Take 2 tablets (1,000 mg total) by mouth every 6 (six) hours as needed for mild pain.   albuterol (VENTOLIN HFA) 108 (90 Base) MCG/ACT inhaler INHALE 2 PUFFS EVERY 4 HOURS FOR WHEEZING OR SHORTNESS OF BREATH AS NEEDED   amLODipine (NORVASC) 10 MG tablet TAKE 1 TABLET BY MOUTH ONCE DAILY   aspirin EC 81 MG tablet Take 1 tablet (81 mg total) by mouth daily.   fluticasone (FLONASE) 50 MCG/ACT nasal spray USE 2 SPRAYS INTO EACH NOSTRIL ONCE DAILY   hydrOXYzine (ATARAX/VISTARIL) 10 MG tablet Take 1 tablet (10 mg total) by mouth at bedtime as needed.   isosorbide mononitrate (IMDUR) 60 MG 24 hr tablet TAKE 1 TABLET BY MOUTH ONCE DAILY   losartan (COZAAR) 100 MG tablet Take 1 tablet (100 mg total) by mouth daily.   meloxicam (MOBIC) 15 MG tablet TAKE 1 TABLET BY MOUTH ONCE DAILY AS NEEDED FOR PAIN WITH FOOD   REPATHA SURECLICK 917 MG/ML SOAJ Inject 1 Dose into the skin every 14 (fourteen) days.   RESTASIS 0.05 % ophthalmic emulsion    rosuvastatin (CRESTOR) 5 MG tablet    sertraline (ZOLOFT) 100 MG  tablet Take 1 tablet (100 mg total) by mouth daily.   SPIRIVA HANDIHALER 18 MCG inhalation capsule INHALE CONTENTS OF 1 CAPSULE ONCE DAILY   No facility-administered encounter medications on file as of 01/05/2022.    Allergies (verified) Bactrim [sulfamethoxazole-trimethoprim], Aleve [naproxen sodium], Atorvastatin, Crestor [rosuvastatin], Simvastatin, and Zetia [ezetimibe]   History: Past Medical History:  Diagnosis Date   Anxiety    Aortic valve sclerosis    Bilateral carotid bruits    Carotid artery stenosis    COPD (chronic obstructive pulmonary disease) (HCC)    Hypertension    Mitral regurgitation    Mixed hyperlipidemia    Shoulder pain    Sleeping difficulties    Past Surgical History:  Procedure Laterality Date   BREAST BIOPSY Left 08/13/2018   affirm bx, x clip, path pending   CATARACT EXTRACTION Left    2017   CHOLECYSTECTOMY     COLONOSCOPY  2012   Dr Candace Cruise   COLONOSCOPY WITH PROPOFOL N/A 05/15/2021   Procedure: COLONOSCOPY WITH PROPOFOL;  Surgeon: Jonathon Bellows, MD;  Location: [facility] ENDOSCOPY;  Service: Gastroenterology;  Laterality: N/A;   LEFT HEART CATH AND CORONARY ANGIOGRAPHY N/A 06/25/2017   Procedure: LEFT HEART CATH AND CORONARY ANGIOGRAPHY;  Surgeon: [name], MD;  Location: [facility];  Service: Cardiovascular;  Laterality: N/A;   Family History  Problem Relation Age of  Onset   Cancer Mother        bladder cancer   Heart disease Mother 75       Pacemaker   Cancer Father        lung   Multiple sclerosis Sister    Valvular heart disease Brother 53       s/p bioprosthetic valve replacement at [facility]   Breast cancer Neg Hx    Social History   Socioeconomic History   Marital status: Divorced    Spouse name: Not on file   Number of children: Not on file   Years of education: Not on file   Highest education level: GED or equivalent  Occupational History   Occupation: retired   Tobacco Use   Smoking status: Former    Packs/day: 0.50     Years: 30.00    Pack years: 15.00    Types: Cigarettes    Quit date: 10/30/2020    Years since quitting: 1.1   Smokeless tobacco: Never   Tobacco comments:    not quite ready due to covid-19   Vaping Use   Vaping Use: Former  Substance and Sexual Activity   Alcohol use: No    Alcohol/week: 0.0 standard drinks   Drug use: No   Sexual activity: Not on file  Other Topics Concern   Not on file  Social History Narrative   Not on file   Social Determinants of Health   Financial Resource Strain: Not on file  Food Insecurity: Not on file  Transportation Needs: Not on file  Physical Activity: Not on file  Stress: Not on file  Social Connections: Not on file    Tobacco Counseling Counseling given: Not Answered Tobacco comments: not quite ready due to covid-19    Clinical Intake:  Pre-visit preparation completed: Yes           How often do you need to have someone help you when you read instructions, pamphlets, or other written materials from your doctor or pharmacy?: 1 - Never  Diabetic?no  Interpreter Needed?: No  Information entered by :: Kirke Shaggy, LPN   Activities of Daily Living In your present state of health, do you have any difficulty performing the following activities: 02/18/2021  Hearing? N  Vision? N  Difficulty concentrating or making decisions? N  Walking or climbing stairs? N  Dressing or bathing? N  Doing errands, shopping? N  Some recent data might be hidden    Patient Care Team: Olin Hauser, DO as PCP - General (Family Medicine) End, Harrell Gave, MD as PCP - Cardiology (Cardiology) Christene Lye, MD (General Surgery)  Indicate any recent Medical Services you may have received from other than [facility] providers in the past year (date may be approximate).     Assessment:   This is a routine wellness examination for Kristin Parker.  Hearing/Vision screen No results found.  Dietary issues and exercise activities  discussed:     Goals Addressed   None    Depression Screen PHQ 2/9 Scores 09/11/2021 03/22/2021 03/08/2021 09/08/2020 12/04/2019 07/07/2019 12/19/2018  PHQ - 2 Score 0 0 0 1 0 1 0  PHQ- 9 Score 1 - 1 4 - - 0    Fall Risk Fall Risk  09/11/2021 03/08/2021 09/13/2020 09/08/2020 12/04/2019  Falls in the past year? 0 0 0 0 0  Number falls in past yr: 0 0 0 0 0  Injury with Fall? 0 0 0 0 0  Follow up Falls evaluation completed Falls  evaluation completed - Falls evaluation completed Falls evaluation completed    FALL RISK PREVENTION PERTAINING TO THE HOME:  Any stairs in or around the home? No  If so, are there any without handrails? No  Home free of loose throw rugs in walkways, pet beds, electrical cords, etc? Yes  Adequate lighting in your home to reduce risk of falls? Yes   ASSISTIVE DEVICES UTILIZED TO PREVENT FALLS:  Life alert? No  Use of a cane, walker or w/c? No  Grab bars in the bathroom? No  Shower chair or bench in shower? Yes  Elevated toilet seat or a handicapped toilet? No   TIMED UP AND GO:  Was the test performed? Yes .  Length of time to ambulate 10 feet: 4 sec.   Gait steady and fast without use of assistive device  Cognitive Function:     6CIT Screen 06/24/2018 05/14/2017  What Year? 0 points 0 points  What month? 0 points 0 points  What time? 0 points 0 points  Count back from 20 0 points 0 points  Months in reverse 0 points 0 points  Repeat phrase 0 points 0 points  Total Score 0 0    Immunizations  There is no immunization history on file for this patient.  TDAP status: Due, Education has been provided regarding the importance of this vaccine. Advised may receive this vaccine at local pharmacy or Health Dept. Aware to provide a copy of the vaccination record if obtained from local pharmacy or Health Dept. Verbalized acceptance and understanding.  Flu Vaccine status: Declined, Education has been provided regarding the importance of this vaccine but  patient still declined. Advised may receive this vaccine at local pharmacy or Health Dept. Aware to provide a copy of the vaccination record if obtained from local pharmacy or Health Dept. Verbalized acceptance and understanding.  Pneumococcal vaccine status: Declined,  Education has been provided regarding the importance of this vaccine but patient still declined. Advised may receive this vaccine at local pharmacy or Health Dept. Aware to provide a copy of the vaccination record if obtained from local pharmacy or Health Dept. Verbalized acceptance and understanding.   Covid-19 vaccine status: Declined, Education has been provided regarding the importance of this vaccine but patient still declined. Advised may receive this vaccine at local pharmacy or Health Dept.or vaccine clinic. Aware to provide a copy of the vaccination record if obtained from local pharmacy or Health Dept. Verbalized acceptance and understanding.  Qualifies for Shingles Vaccine? Yes   Zostavax completed No   Shingrix Completed?: No.    Education has been provided regarding the importance of this vaccine. Patient has been advised to call insurance company to determine out of pocket expense if they have not yet received this vaccine. Advised may also receive vaccine at local pharmacy or Health Dept. Verbalized acceptance and understanding.  Screening Tests Health Maintenance  Topic Date Due   COVID-19 Vaccine (1) Never done   Zoster Vaccines- Shingrix (1 of 2) Never done   DEXA SCAN  Never done   MAMMOGRAM  08/06/2020   INFLUENZA VACCINE  01/26/2022 (Originally 05/29/2021)   Pneumonia Vaccine 90+ Years old (1 - PCV) 09/11/2022 (Originally 04/04/1957)   TETANUS/TDAP  09/11/2022 (Originally 03/29/2021)   COLONOSCOPY (Pts 45-68yr Insurance coverage will need to be confirmed)  05/15/2022   Hepatitis C Screening  Completed   HPV VACCINES  Aged Out    Health Maintenance  Health Maintenance Due  Topic Date Due   COVID-19 Vaccine  (  1) Never done   Zoster Vaccines- Shingrix (1 of 2) Never done   DEXA SCAN  Never done   MAMMOGRAM  08/06/2020    Colorectal cancer screening: Type of screening: Colonoscopy. Completed 05/15/21. Repeat every 1 years  Mammogram status: Completed 08/13/18, declined referral. Repeat every year    Lung Cancer Screening: (Low Dose CT Chest recommended if Age 31-80 years, 30 pack-year currently smoking OR have quit w/in 15years.) does not qualify.    Additional Screening:  Hepatitis C Screening: does qualify; Completed 06/18/18  Vision Screening: Recommended annual ophthalmology exams for early detection of glaucoma and other disorders of the eye. Is the patient up to date with their annual eye exam?  Yes  Who is the provider or what is the name of the office in which the patient attends annual eye exams? [facility] [place] If pt is not established with a provider, would they like to be referred to a provider to establish care? No .   Dental Screening: Recommended annual dental exams for proper oral hygiene  Community Resource Referral / Chronic Care Management: CRR required this visit?  No   CCM required this visit?  No      Plan:     I have personally reviewed and noted the following in the patients chart:   Medical and social history Use of alcohol, tobacco or illicit drugs  Current medications and supplements including opioid prescriptions.  Functional ability and status Nutritional status Physical activity Advanced directives List of other physicians Hospitalizations, surgeries, and ER visits in previous 12 months Vitals Screenings to include cognitive, depression, and falls Referrals and appointments  In addition, I have reviewed and discussed with patient certain preventive protocols, quality metrics, and best practice recommendations. A written personalized care plan for preventive services as well as general preventive health recommendations were provided to  patient.     Dionisio David, LPN   [id number]/50/[id number]   Nurse Notes: none

## 2022-01-05 NOTE — Patient Instructions (Signed)
Kristin Parker , Thank you for taking time to come for your Medicare Wellness Visit. I appreciate your ongoing commitment to your health goals. Please review the following plan we discussed and let me know if I can assist you in the future.   Screening recommendations/referrals: Colonoscopy: 05/15/21 Mammogram: 08/13/18, declined referral Bone Density: n/d Recommended yearly ophthalmology/optometry visit for glaucoma screening and checkup Recommended yearly dental visit for hygiene and checkup  Vaccinations: Influenza vaccine: 08/21/14 Pneumococcal vaccine: n/d Tdap vaccine: 03/30/11, due Shingles vaccine: n/d   Covid-19:n/d  Advanced directives: no  Conditions/risks identified: none  Next appointment: Follow up in one year for your annual wellness visit - 01/11/23 @ 9:40am in person   Preventive Care 25 Years and Older, Female Preventive care refers to lifestyle choices and visits with your health care provider that can promote health and wellness. What does preventive care include? A yearly physical exam. This is also called an annual well check. Dental exams once or twice a year. Routine eye exams. Ask your health care provider how often you should have your eyes checked. Personal lifestyle choices, including: Daily care of your teeth and gums. Regular physical activity. Eating a healthy diet. Avoiding tobacco and drug use. Limiting alcohol use. Practicing safe sex. Taking low-dose aspirin every day. Taking vitamin and mineral supplements as recommended by your health care provider. What happens during an annual well check? The services and screenings done by your health care provider during your annual well check will depend on your age, overall health, lifestyle risk factors, and family history of disease. Counseling  Your health care provider may ask you questions about your: Alcohol use. Tobacco use. Drug use. Emotional well-being. Home and relationship  well-being. Sexual activity. Eating habits. History of falls. Memory and ability to understand (cognition). Work and work Statistician. Reproductive health. Screening  You may have the following tests or measurements: Height, weight, and BMI. Blood pressure. Lipid and cholesterol levels. These may be checked every 5 years, or more frequently if you are over 32 years old. Skin check. Lung cancer screening. You may have this screening every year starting at age 40 if you have a 30-pack-year history of smoking and currently smoke or have quit within the past 15 years. Fecal occult blood test (FOBT) of the stool. You may have this test every year starting at age 89. Flexible sigmoidoscopy or colonoscopy. You may have a sigmoidoscopy every 5 years or a colonoscopy every 10 years starting at age 57. Hepatitis C blood test. Hepatitis B blood test. Sexually transmitted disease (STD) testing. Diabetes screening. This is done by checking your blood sugar (glucose) after you have not eaten for a while (fasting). You may have this done every 1-3 years. Bone density scan. This is done to screen for osteoporosis. You may have this done starting at age 31. Mammogram. This may be done every 1-2 years. Talk to your health care provider about how often you should have regular mammograms. Talk with your health care provider about your test results, treatment options, and if necessary, the need for more tests. Vaccines  Your health care provider may recommend certain vaccines, such as: Influenza vaccine. This is recommended every year. Tetanus, diphtheria, and acellular pertussis (Tdap, Td) vaccine. You may need a Td booster every 10 years. Zoster vaccine. You may need this after age 90. Pneumococcal 13-valent conjugate (PCV13) vaccine. One dose is recommended after age 24. Pneumococcal polysaccharide (PPSV23) vaccine. One dose is recommended after age 71. Talk to your health  care provider about which  screenings and vaccines you need and how often you need them. This information is not intended to replace advice given to you by your health care provider. Make sure you discuss any questions you have with your health care provider. Document Released: 11/11/2015 Document Revised: 07/04/2016 Document Reviewed: 08/16/2015 Elsevier Interactive Patient Education  2017 [place] Prevention in the Home Falls can cause injuries. They can happen to people of all ages. There are many things you can do to make your home safe and to help prevent falls. What can I do on the outside of my home? Regularly fix the edges of walkways and driveways and fix any cracks. Remove anything that might make you trip as you walk through a door, such as a raised step or threshold. Trim any bushes or trees on the path to your home. Use bright outdoor lighting. Clear any walking paths of anything that might make someone trip, such as rocks or tools. Regularly check to see if handrails are loose or broken. Make sure that both sides of any steps have handrails. Any raised decks and porches should have guardrails on the edges. Have any leaves, snow, or ice cleared regularly. Use sand or salt on walking paths during winter. Clean up any spills in your garage right away. This includes oil or grease spills. What can I do in the bathroom? Use night lights. Install grab bars by the toilet and in the tub and shower. Do not use towel bars as grab bars. Use non-skid mats or decals in the tub or shower. If you need to sit down in the shower, use a plastic, non-slip stool. Keep the floor dry. Clean up any water that spills on the floor as soon as it happens. Remove soap buildup in the tub or shower regularly. Attach bath mats securely with double-sided non-slip rug tape. Do not have throw rugs and other things on the floor that can make you trip. What can I do in the bedroom? Use night lights. Make sure that you have a  light by your bed that is easy to reach. Do not use any sheets or blankets that are too big for your bed. They should not hang down onto the floor. Have a firm chair that has side arms. You can use this for support while you get dressed. Do not have throw rugs and other things on the floor that can make you trip. What can I do in the kitchen? Clean up any spills right away. Avoid walking on wet floors. Keep items that you use a lot in easy-to-reach places. If you need to reach something above you, use a strong step stool that has a grab bar. Keep electrical cords out of the way. Do not use floor polish or wax that makes floors slippery. If you must use wax, use non-skid floor wax. Do not have throw rugs and other things on the floor that can make you trip. What can I do with my stairs? Do not leave any items on the stairs. Make sure that there are handrails on both sides of the stairs and use them. Fix handrails that are broken or loose. Make sure that handrails are as long as the stairways. Check any carpeting to make sure that it is firmly attached to the stairs. Fix any carpet that is loose or worn. Avoid having throw rugs at the top or bottom of the stairs. If you do have throw rugs, attach them to  the floor with carpet tape. Make sure that you have a light switch at the top of the stairs and the bottom of the stairs. If you do not have them, ask someone to add them for you. What else can I do to help prevent falls? Wear shoes that: Do not have high heels. Have rubber bottoms. Are comfortable and fit you well. Are closed at the toe. Do not wear sandals. If you use a stepladder: Make sure that it is fully opened. Do not climb a closed stepladder. Make sure that both sides of the stepladder are locked into place. Ask someone to hold it for you, if possible. Clearly mark and make sure that you can see: Any grab bars or handrails. First and last steps. Where the edge of each step  is. Use tools that help you move around (mobility aids) if they are needed. These include: Canes. Walkers. Scooters. Crutches. Turn on the lights when you go into a dark area. Replace any light bulbs as soon as they burn out. Set up your furniture so you have a clear path. Avoid moving your furniture around. If any of your floors are uneven, fix them. If there are any pets around you, be aware of where they are. Review your medicines with your doctor. Some medicines can make you feel dizzy. This can increase your chance of falling. Ask your doctor what other things that you can do to help prevent falls. This information is not intended to replace advice given to you by your health care provider. Make sure you discuss any questions you have with your health care provider. Document Released: 08/11/2009 Document Revised: 03/22/2016 Document Reviewed: 11/19/2014 Elsevier Interactive Patient Education  2017 [employer].

## 2022-01-07 LAB — COVID-19, FLU A+B AND RSV
Influenza A, NAA: NOT DETECTED
Influenza B, NAA: NOT DETECTED
RSV, NAA: NOT DETECTED
SARS-CoV-2, NAA: NOT DETECTED

## 2022-02-07 ENCOUNTER — Ambulatory Visit (INDEPENDENT_AMBULATORY_CARE_PROVIDER_SITE_OTHER): Payer: Medicare HMO | Admitting: Internal Medicine

## 2022-02-07 ENCOUNTER — Encounter: Payer: Self-pay | Admitting: Internal Medicine

## 2022-02-07 VITALS — BP 132/78 | HR 70 | Wt 170.0 lb

## 2022-02-07 DIAGNOSIS — I25118 Atherosclerotic heart disease of native coronary artery with other forms of angina pectoris: Secondary | ICD-10-CM | POA: Diagnosis not present

## 2022-02-07 DIAGNOSIS — I6523 Occlusion and stenosis of bilateral carotid arteries: Secondary | ICD-10-CM

## 2022-02-07 DIAGNOSIS — I10 Essential (primary) hypertension: Secondary | ICD-10-CM

## 2022-02-07 DIAGNOSIS — R0602 Shortness of breath: Secondary | ICD-10-CM

## 2022-02-07 DIAGNOSIS — J449 Chronic obstructive pulmonary disease, unspecified: Secondary | ICD-10-CM

## 2022-02-07 DIAGNOSIS — E785 Hyperlipidemia, unspecified: Secondary | ICD-10-CM | POA: Diagnosis not present

## 2022-02-07 DIAGNOSIS — I25119 Atherosclerotic heart disease of native coronary artery with unspecified angina pectoris: Secondary | ICD-10-CM

## 2022-02-07 NOTE — Progress Notes (Signed)
? ?Follow-up Outpatient Visit ?Date: 02/07/2022 ? ?Primary Care Provider: ?Olin Hauser, DO ?[address] ?[place] [address] ? ?Chief Complaint: Abdominal fullness ? ?HPI:  Kristin Parker is a 71 y.o. female with history of  coronary artery disease (up to 40% distal LMCA by cath in 05/2017), carotid artery stenosis, hypertension, hyperlipidemia, depression, and anxiety, who presents for follow-up of coronary artery disease.  She was last seen in our office in 10/2020 by Laurann Montana, NP following coronary CTA and echo in 09/2020.  She reported stable exertional dyspnea at that time.  She has continued to follow with Dr. Debara Pickett in our lipid clinic for management of her dyslipidemia. ? ?Today, Kristin Parker is most concerned about progressive abdominal fullness and bloating.  She feels like this has worsened despite trying to improve her diet.  She also feels like her abdomen is sore all over.  This predates her cholecystectomy last year but seems of gotten worse since then.  She has also noticed some changes in her bowels. ? ?Kristin Parker reports stable exertional dyspnea.  She also has some chest pain when she lies down.  She does not have any exertional chest pain.  She notes sporadic self-limited palpitations during which it feels like her heart beats harder.  This sometimes accompanies the aforementioned chest discomfort.  She has not had any lightheadedness.  She has mild puffiness in the left ankle from time to time.  Home blood pressures are typically in the normal range, similar to today's.  She is concerned about feeling fatigued a lot.  She has never undergone a sleep study.  She previously was told that she snored fairly loudly.  In general, she feels refreshed in the mornings.  She does not nap during the day. ? ?-------------------------------------------------------------------------------------------------- ? ?Cardiovascular History & Procedures: ?Cardiovascular Problems: ?Coronary artery  disease ?Aortic sclerosis ?Carotid artery stenosis (mild to moderate) ?  ?Risk Factors: ?Cerebrovascular disease, hypertension, hyperlipidemia, tobacco use, and age greater than 42 ?  ?Cath/PCI: ?LHC (06/25/17): Mild to moderate CAD, including 40% distal LMCA, 20% ostial LAD, 20-30% mid LAD, and sequential 30% proximal and mid LCx stenoses.  Small, nondominant RCA without significant disease.  Normal left ventricular filling pressure. ?  ?CV Surgery: ?None ?  ?EP Procedures and Devices: ?None ?  ?Non-Invasive Evaluation(s): ?TTE (10/12/2020): Normal LV size with mild LVH.  LVEF 55-60% with grade 1 diastolic dysfunction.  GLS -15.2%.  Normal RV size and function.  Aortic sclerosis with borderline mild stenosis noted (mean gradient 10 mmHg). ?Coronary CTA (09/29/2020): Mild-moderate, nonobstructive disease including 25% stenosis in the proximal LMCA, less than 25% stenosis in the proximal LAD, and 25-50% stenosis in the proximal LCx (dominant vessel).  Calcium score 192 (79th percentile).  Significant atheroma noted in the descending aorta. ?ETT (06/18/17): Abnormal exercise tolerance test, the target heart rate not achieved.  1-2 mm horizontal ST depressions noted in inferior leads.  Patient only achieved 84% MPHR due to shortness of breath and fatigue. ?Carotid artery Duplex (03/01/17): Smooth plaque, bilaterally. 40-59% RICA stenosis. 9-14% LICA stenosis. Patent vertebral arteries with antegrade flow. Normal subclavian arteries, bilaterally. ?TTE (03/01/17): Normal LV size and function with LVEF 60-65% and grade 1 diastolic dysfunction. Mild MR. Normal RV size and function. Normal PA pressure. ? ?Recent CV Pertinent Labs: ?Lab Results  ?Component Value Date  ? CHOL 161 09/06/2021  ? CHOL 135 07/22/2019  ? HDL 37 (L) 09/06/2021  ? HDL 43 07/22/2019  ? [place] 92 09/06/2021  ? LDLDIRECT  62 05/28/2017  ? TRIG 230 (H) 09/06/2021  ? CHOLHDL 4.4 09/06/2021  ? INR 0.94 06/24/2017  ? BNP 53.0 01/27/2017  ? K 3.7 09/06/2021  ? MG  1.8 02/18/2021  ? BUN 12 09/06/2021  ? BUN 11 07/22/2019  ? CREATININE 0.82 09/06/2021  ? ? ?Past medical and surgical history were reviewed and updated in EPIC. ? ?Current Meds  ?Medication Sig  ? acetaminophen (TYLENOL) 500 MG tablet Take 2 tablets (1,000 mg total) by mouth every 6 (six) hours as needed for mild pain.  ? albuterol (VENTOLIN HFA) 108 (90 Base) MCG/ACT inhaler INHALE 2 PUFFS EVERY 4 HOURS FOR WHEEZING OR SHORTNESS OF BREATH AS NEEDED  ? amLODipine (NORVASC) 10 MG tablet TAKE 1 TABLET BY MOUTH ONCE DAILY  ? aspirin EC 81 MG tablet Take 1 tablet (81 mg total) by mouth daily.  ? fluticasone (FLONASE) 50 MCG/ACT nasal spray USE 2 SPRAYS INTO EACH NOSTRIL ONCE DAILY  ? hydrOXYzine (ATARAX/VISTARIL) 10 MG tablet Take 1 tablet (10 mg total) by mouth at bedtime as needed.  ? isosorbide mononitrate (IMDUR) 60 MG 24 hr tablet TAKE 1 TABLET BY MOUTH ONCE DAILY  ? losartan (COZAAR) 100 MG tablet Take 1 tablet (100 mg total) by mouth daily.  ? meloxicam (MOBIC) 15 MG tablet TAKE 1 TABLET BY MOUTH ONCE DAILY AS NEEDED FOR PAIN WITH FOOD  ? REPATHA SURECLICK 983 MG/ML SOAJ Inject 1 Dose into the skin every 14 (fourteen) days.  ? sertraline (ZOLOFT) 100 MG tablet Take 1 tablet (100 mg total) by mouth daily.  ? SPIRIVA HANDIHALER 18 MCG inhalation capsule INHALE CONTENTS OF 1 CAPSULE ONCE DAILY  ? ? ?Allergies: Bactrim [sulfamethoxazole-trimethoprim], Aleve [naproxen sodium], Atorvastatin, Crestor [rosuvastatin], Simvastatin, and Zetia [ezetimibe] ? ?Social History  ? ?Tobacco Use  ? Smoking status: Former  ?  Packs/day: 0.50  ?  Years: 30.00  ?  Pack years: 15.00  ?  Types: Cigarettes  ?  Quit date: 10/30/2020  ?  Years since quitting: 1.2  ? Smokeless tobacco: Never  ? Tobacco comments:  ?  Quit Jan 2022  ?Vaping Use  ? Vaping Use: Former  ?Substance Use Topics  ? Alcohol use: No  ?  Alcohol/week: 0.0 standard drinks  ? Drug use: No  ? ? ?Family History  ?Problem Relation Age of Onset  ? Cancer Mother   ?      bladder cancer  ? Heart disease Mother 84  ?     Pacemaker  ? Cancer Father   ?     lung  ? Multiple sclerosis Sister   ? Valvular heart disease Brother 62  ?     s/p bioprosthetic valve replacement at [facility]  ? Breast cancer Neg Hx   ? ? ?Review of Systems: ?A 12-system review of systems was performed and was negative except as noted in the HPI. ? ?-------------------------------------------------------------------------------------------------- ? ?Physical Exam: ?BP 132/78 (BP Location: Left Arm, Patient Position: Sitting, Cuff Size: Normal)   Pulse 70   Wt 170 lb (77.1 kg)   SpO2 95%   BMI 26.63 kg/m?  ? ?General:  NAD. ?Neck: No JVD or HJR. ?Lungs: Coarse breath sounds bilaterally without wheezes or crackles. ?Heart: Regular rate and rhythm without murmurs, rubs, or gallops. ?Abdomen: Soft, nontender, nondistended. ?Extremities: No lower extremity edema. ? ?EKG: Normal sinus rhythm with nonspecific ST/T changes.  No significant change from prior tracing on 02/17/2021. ? ?Lab Results  ?Component Value Date  ? WBC 7.8 09/06/2021  ?  HGB 14.9 09/06/2021  ? HCT 44.0 09/06/2021  ? MCV 89.4 09/06/2021  ? PLT 339 09/06/2021  ? ? ?Lab Results  ?Component Value Date  ? NA 144 09/06/2021  ? K 3.7 09/06/2021  ? CL 105 09/06/2021  ? CO2 26 09/06/2021  ? BUN 12 09/06/2021  ? CREATININE 0.82 09/06/2021  ? GLUCOSE 101 (H) 09/06/2021  ? ALT 14 09/06/2021  ? ? ?Lab Results  ?Component Value Date  ? CHOL 161 09/06/2021  ? HDL 37 (L) 09/06/2021  ? [place] 92 09/06/2021  ? LDLDIRECT 62 05/28/2017  ? TRIG 230 (H) 09/06/2021  ? CHOLHDL 4.4 09/06/2021  ? ? ?-------------------------------------------------------------------------------------------------- ? ?ASSESSMENT AND PLAN: ?Coronary artery disease with stable angina: ?Prior cath and subsequent coronary CTA showed nonobstructive disease.  I do not believe that Kristin Parker's chronic fatigue and dyspnea are explained solely by her CAD.  Continue amlodipine and isosorbide mononitrate  for possible component of vasospasm and a/or microvascular dysfunction.  We will also continue with aspirin and Repatha to prevent progression of disease. ? ?COPD and shortness of breath: ?Underlying lung disease most likely a

## 2022-02-07 NOTE — Patient Instructions (Signed)

## 2022-02-08 ENCOUNTER — Encounter: Payer: Self-pay | Admitting: Internal Medicine

## 2022-02-08 ENCOUNTER — Encounter: Payer: Self-pay | Admitting: *Deleted

## 2022-02-27 ENCOUNTER — Other Ambulatory Visit: Payer: Self-pay | Admitting: Internal Medicine

## 2022-03-10 ENCOUNTER — Other Ambulatory Visit: Payer: Self-pay | Admitting: Internal Medicine

## 2022-04-06 ENCOUNTER — Encounter: Payer: Self-pay | Admitting: *Deleted

## 2022-04-06 DIAGNOSIS — Z006 Encounter for examination for normal comparison and control in clinical research program: Secondary | ICD-10-CM

## 2022-04-06 NOTE — Research (Signed)
Message left to inform her about [employer] research. Encouraged her to call back.

## 2022-04-18 IMAGING — US US ABDOMEN LIMITED RUQ/ASCITES
1 series · 14 of 25 positions shown · non-contrast
Comparison: None.

CLINICAL DATA: Abdomen pain with bloating

EXAM:
ULTRASOUND ABDOMEN LIMITED RIGHT UPPER QUADRANT

[Series 1: us abdomen limited ruq (liver/gb) · 42 acquisitions, 14 frames shown]
[im 1/42]
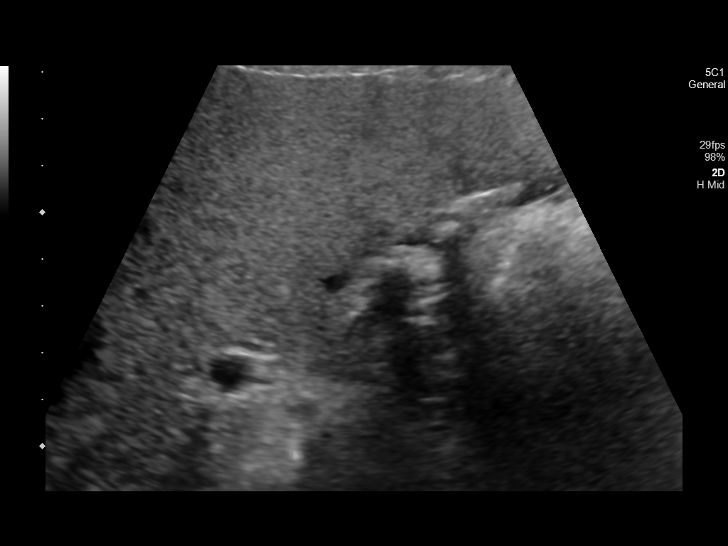
[im 4/42]
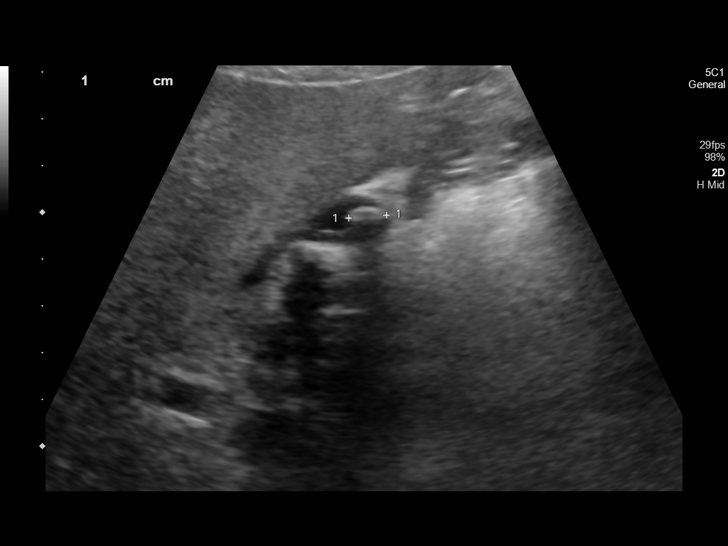
[im 7/42]
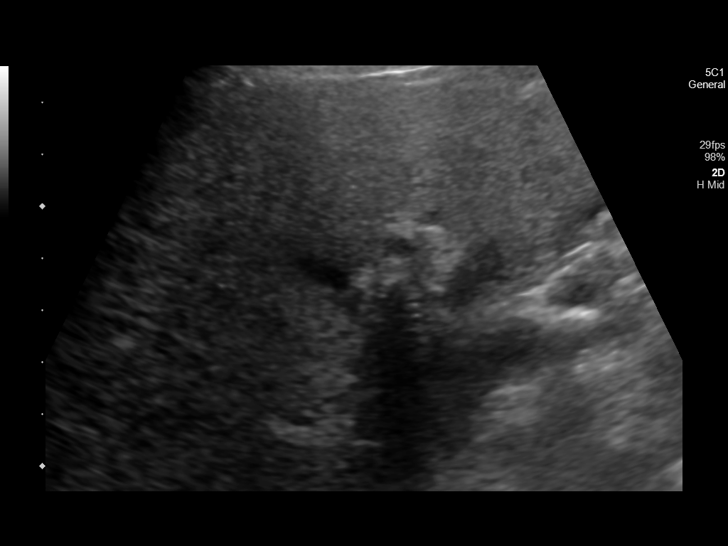
[im 11/42]
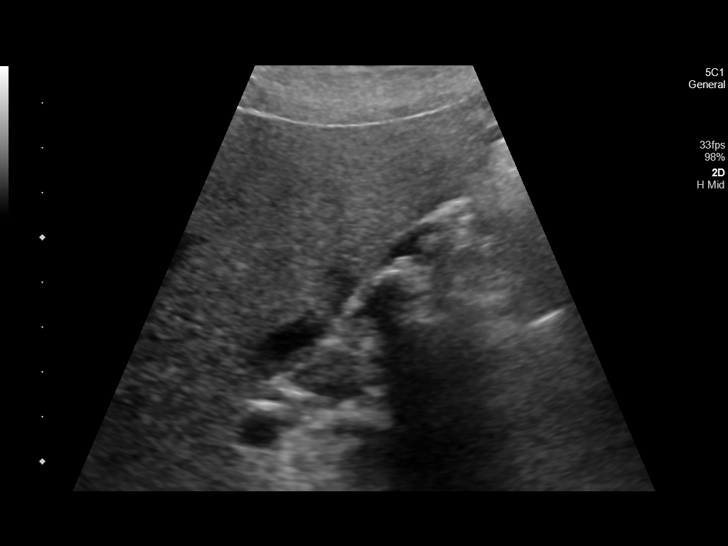
[im 14/42]
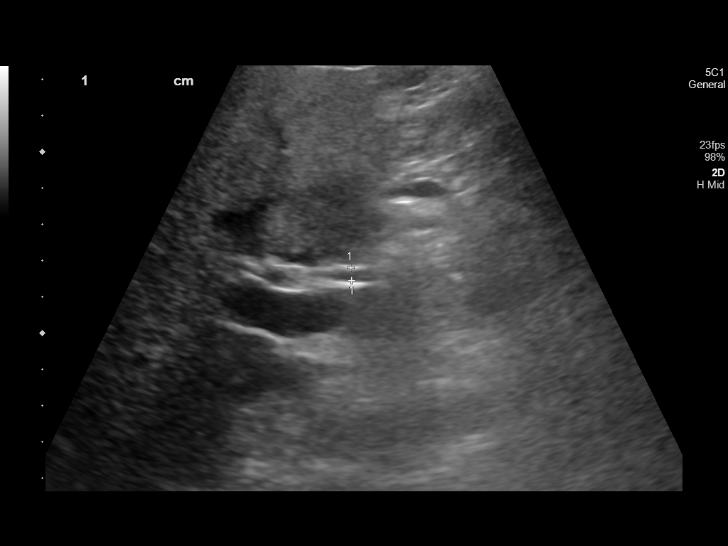
[im 16/42]
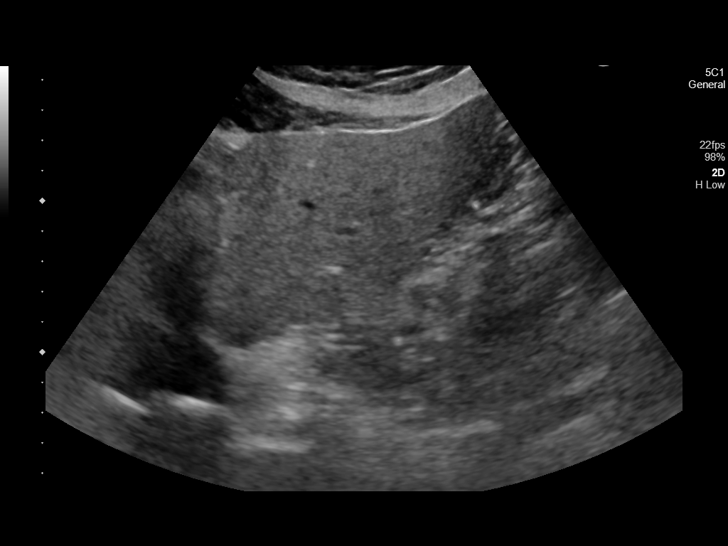
[im 19/42]
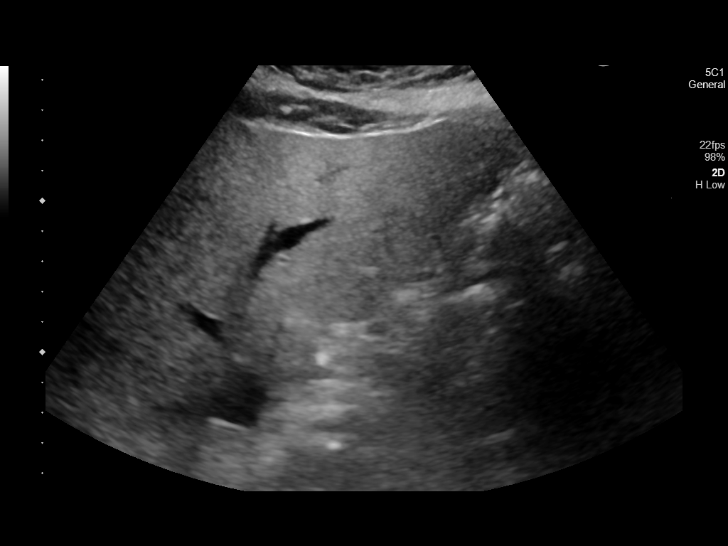
[im 23/42]
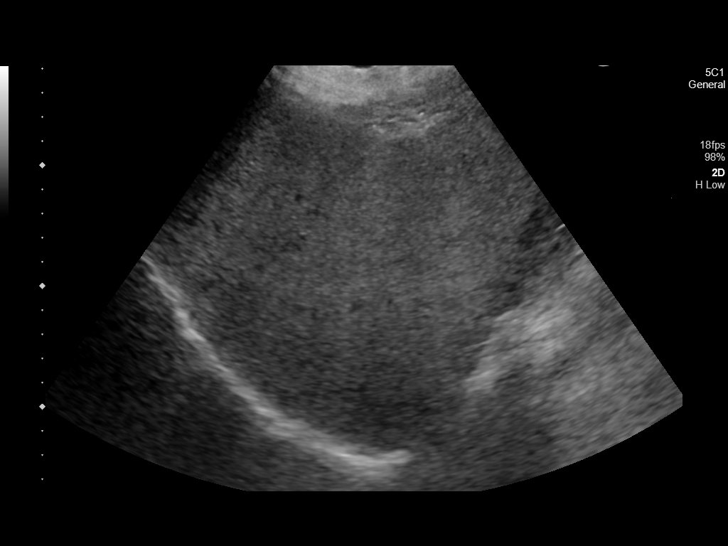
[im 26/42]
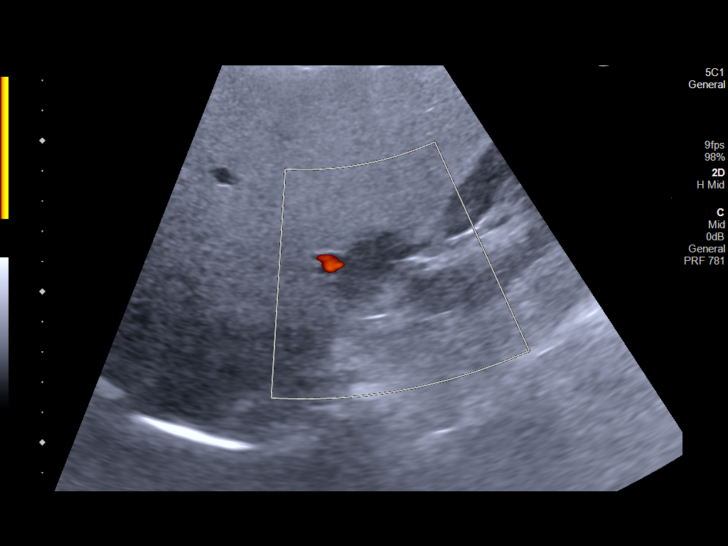
[im 28/42]
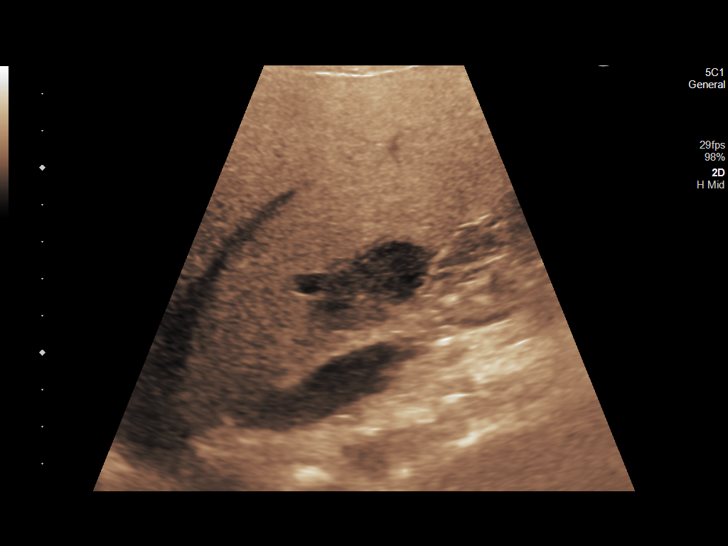
[im 31/42]
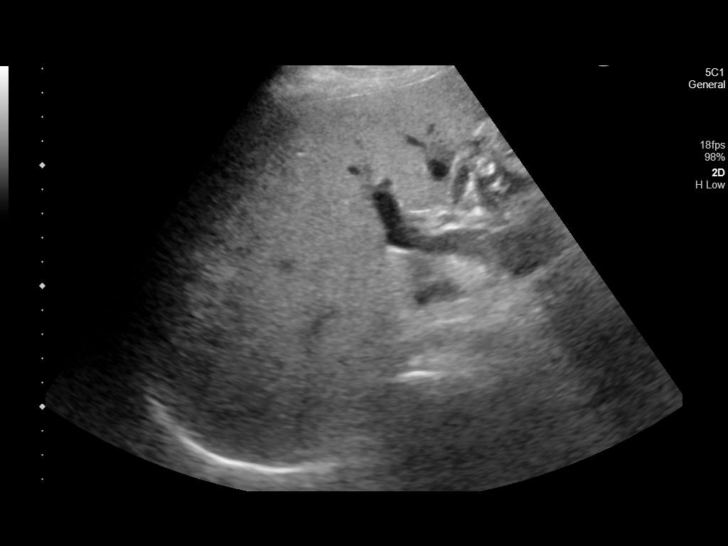
[im 35/42]
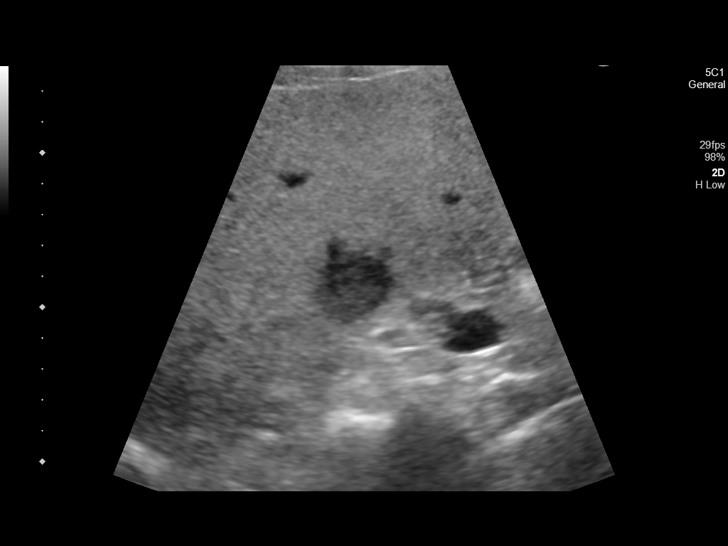
[im 38/42]
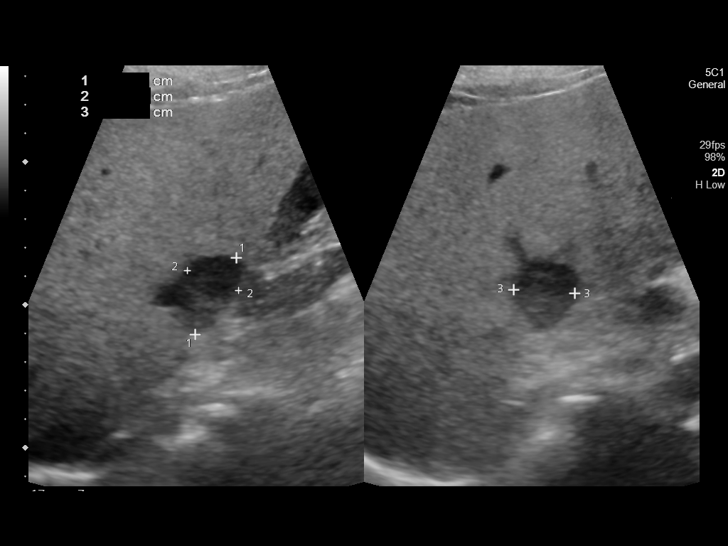
[im 42/42]
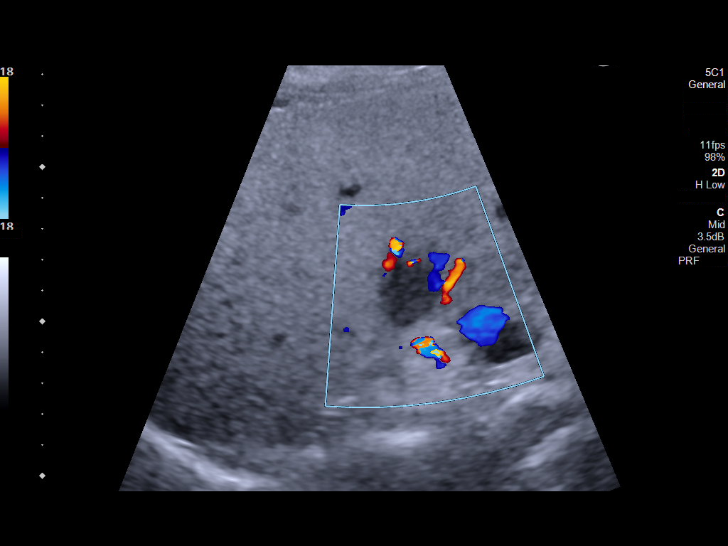

[14 of 25 positions shown; findings below may reference images not displayed]

FINDINGS: Gallbladder:

Contracted gallbladder with multiple shadowing stones measuring up
to 8 mm. Normal wall thickness. Negative sonographic Murphy.

Common bile duct:

Diameter: 3.5 mm

Liver:

Increased hepatic echogenicity. Circumscribed hypoechoic lesion in
the right hepatic lobe measuring 3.1 x 1.9 x 2.1 cm. Portal vein is
patent on color Doppler imaging with normal direction of blood flow
towards the liver.

Other: None.
IMPRESSION: 1. Contracted gallbladder with stones but no sonographic evidence
for acute cholecystitis
2. Hepatic steatosis. 3.1 cm focal hypoechoic lesion in the right
hepatic lobe indeterminate for hypoechoic mass or geographic focal
fat sparing. When the patient is clinically stable and able to
follow directions and hold their breath (preferably as an
outpatient) further evaluation with dedicated abdominal MRI should
be considered.

## 2022-04-30 ENCOUNTER — Encounter: Payer: Self-pay | Admitting: *Deleted

## 2022-04-30 DIAGNOSIS — Z006 Encounter for examination for normal comparison and control in clinical research program: Secondary | ICD-10-CM

## 2022-04-30 NOTE — Research (Signed)
Message left for Ms Mcglade about [employer], [employer].Encouraged her to call me back.

## 2022-06-04 ENCOUNTER — Other Ambulatory Visit: Payer: Self-pay | Admitting: Family Medicine

## 2022-06-04 ENCOUNTER — Other Ambulatory Visit: Payer: Self-pay | Admitting: Internal Medicine

## 2022-06-04 DIAGNOSIS — J3089 Other allergic rhinitis: Secondary | ICD-10-CM

## 2022-06-04 DIAGNOSIS — I10 Essential (primary) hypertension: Secondary | ICD-10-CM

## 2022-06-05 NOTE — Telephone Encounter (Signed)
Requested Prescriptions  Pending Prescriptions Disp Refills  . amLODipine (NORVASC) 10 MG tablet [Pharmacy Med Name: AMLODIPINE BESYLATE 10 MG TAB] 90 tablet     Sig: TAKE 1 TABLET BY MOUTH ONCE DAILY     Cardiovascular: Calcium Channel Blockers 2 Passed - 06/04/2022  5:19 PM      Passed - Last BP in normal range    BP Readings from Last 1 Encounters:  02/07/22 132/78         Passed - Last Heart Rate in normal range    Pulse Readings from Last 1 Encounters:  02/07/22 70         Passed - Valid encounter within last 6 months    Recent Outpatient Visits          5 months ago COPD with acute exacerbation [facility])   [place], DO   7 months ago Osteoarthritis of glenohumeral joint, right   [place], DO   8 months ago Annual physical exam   [place], DO   1 year ago Elevated hemoglobin A1c   [place], DO   1 year ago Distended abdomen   [place], Devonne Doughty, DO      Future Appointments            In 1 week Jonathon Bellows, MD [place]           . fluticasone (FLONASE) 50 MCG/ACT nasal spray [Pharmacy Med Name: FLUTICASONE PROPIONATE 50 MCG/ACT N] 16 g 5    Sig: USE 2 SPRAYS INTO EACH NOSTRIL ONCE DAILY     Ear, Nose, and Throat: Nasal Preparations - Corticosteroids Passed - 06/04/2022  5:19 PM      Passed - Valid encounter within last 12 months    Recent Outpatient Visits          5 months ago COPD with acute exacerbation [facility])   [place], DO   7 months ago Osteoarthritis of glenohumeral joint, right   [place], DO   8 months ago Annual physical exam   [place], DO   1 year ago Elevated hemoglobin A1c   [place], DO   1 year ago Distended abdomen   [facility] Olin Hauser, DO      Future Appointments            In 1 week Jonathon Bellows, MD [place]

## 2022-06-18 ENCOUNTER — Encounter: Payer: Self-pay | Admitting: Gastroenterology

## 2022-06-18 ENCOUNTER — Other Ambulatory Visit: Payer: Self-pay

## 2022-06-18 ENCOUNTER — Ambulatory Visit (INDEPENDENT_AMBULATORY_CARE_PROVIDER_SITE_OTHER): Payer: Medicare HMO | Admitting: Gastroenterology

## 2022-06-18 VITALS — BP 152/68 | HR 80 | Temp 98.4°F | Ht 67.0 in | Wt 171.2 lb

## 2022-06-18 DIAGNOSIS — Z86010 Personal history of colonic polyps: Secondary | ICD-10-CM

## 2022-06-18 DIAGNOSIS — R140 Abdominal distension (gaseous): Secondary | ICD-10-CM | POA: Diagnosis not present

## 2022-06-18 DIAGNOSIS — K5909 Other constipation: Secondary | ICD-10-CM | POA: Diagnosis not present

## 2022-06-18 MED ORDER — CLENPIQ 10-3.5-12 MG-GM -GM/160ML PO SOLN: ORAL | 0 refills | Status: DC

## 2022-06-18 NOTE — Patient Instructions (Signed)
Please give [place] a call if you don't feel any better.  Polyethylene Glycol Powder for Solution What is this medication? POLYETHYLENE GLYCOL (pol ee ETH i leen; GLYE col) prevents and treats occasional constipation. It works by increasing the amount of water your intestine absorbs. This softens the stool, making it easier to have a bowel movement. It also increases pressure, which prompts the muscles in your intestines to move stool. It belongs to a group of medications called laxatives. This medicine may be used for other purposes; ask your health care provider or pharmacist if you have questions. COMMON BRAND NAME(S): GaviLax, GIALAX, GlycoLax, Healthylax, MiraLax, Smooth LAX, Vita Health What should I tell my care team before I take this medication? They need to know if you have any of these conditions: History of blockage in your bowels Nausea Phenylketonuria Stomach or intestine problem Stomach pain Sudden change in bowel habit lasting more than 2 weeks Vomiting An unusual or allergic reaction to polyethylene glycol (PEG), other medications, foods, dyes, or preservatives Pregnant or trying to get pregnant Breast-feeding How should I use this medication? Take this medication by mouth. Take it as directed on the label. Add the right dose to 4 to 8 ounces or 120 to 240 mL of water, juice, soda, coffee or tea. Do not mix this medication with foods or other liquids. Do not combine with starch-based thickeners (e.g., flour, cornstarch, arrowroot, tapioca, xanthan gum). Mix well. Drink the solution. Do not use it more often than directed. Talk to your care team about the use of this medication in children. While it may be given to children as young as 16 years for selected conditions, precautions do apply. Overdosage: If you think you have taken too much of this medicine contact a poison control center or emergency room at once. NOTE: This medicine is only for you. Do not share this medicine with  others. What if I miss a dose? If you miss a dose, take it as soon as you can. If it is almost time for your next dose, take only that dose. Do not take double or extra doses. What may interact with this medication? Interactions are not expected. This list may not describe all possible interactions. Give your health care provider a list of all the medicines, herbs, non-prescription drugs, or dietary supplements you use. Also tell them if you smoke, drink alcohol, or use illegal drugs. Some items may interact with your medicine. What should I watch for while using this medication? Do not use for more than one week without advice from your care team. If your constipation returns, check with your care team. Drink plenty of water while taking this medication. Drinking water helps decrease constipation. Stop using this medication and contact your care team if you experience any rectal bleeding or do not have a bowel movement after use. These could be signs of a more serious condition. What side effects may I notice from receiving this medication? Side effects that you should report to your care team as soon as possible: Allergic reactions--skin rash, itching, hives, swelling of the face, lips, tongue, or throat Side effects that usually do not require medical attention (report to your care team if they continue or are bothersome): Bloating Gas Nausea Stomach cramping This list may not describe all possible side effects. Call your doctor for medical advice about side effects. You may report side effects to FDA at 1-800-FDA-1088. Where should I keep my medication? Keep out of the reach of children and  pets. Store at room temperature between 20 and 25 degrees C (68 and 77 degrees F). Get rid of any unused medication after the expiration date. To get rid of medications that are no longer needed or have expired: Take the medication to a medication take-back program. Check with your pharmacy or law  enforcement to find a location. If you cannot return the medication, check the label or package insert to see if the medication should be thrown out in the garbage or flushed down the toilet. If you are not sure, ask your care team. If it is safe to put it in the trash, pour the medication out of the container. Mix the medication with cat litter, dirt, coffee grounds, or other unwanted substance. Seal the mixture in a bag or container. Put it in the trash. NOTE: This sheet is a summary. It may not cover all possible information. If you have questions about this medicine, talk to your doctor, pharmacist, or health care provider.  2023 Elsevier/Gold Standard (2020-03-25 00:00:00)

## 2022-06-18 NOTE — Progress Notes (Signed)
Jonathon Bellows MD, MRCP(U.K) [address]  [place]  [place], [place] [address]  Main: [phone]  Fax: [phone]   Gastroenterology Consultation  Referring Provider:     Nobie Putnam * Primary Care Physician:  Olin Hauser, DO Primary Gastroenterologist:  Dr. Jonathon Bellows  Reason for Consultation:     Bloating        HPI:   Kristin Parker is a 71 y.o. y/o female referred for consultation & management  by Dr. Parks Ranger, Devonne Doughty, DO.     05/15/2021: Colonoscopy: 3 polyps in the sigmoid colon that were 4 to 5 mm in size and a 10 mm polyp were resectedin the ascending colon and 2 polyps in the cecum 10 to 12 mm in size resected with 2 further semipedunculated polyps in the transverse colon 8 to 10 mm in size that were resected.  In total 9 polyps resected.  There were all tubular adenomas.  04/06/2021 CT abdomen for abdominal distention showed no bowel obstruction hepatic steatosis, cholecystectomy moderate stool burden  Her main complaint today is abdominal distention.  To the reason she states she had the CAT scan no fluid was noted.  She has.'s of constipation alternating with diarrhea not taking any medications for the same.  She is a smoker.  No abdominal pain. Past Medical History:  Diagnosis Date   Anxiety    Aortic valve sclerosis    Bilateral carotid bruits    Carotid artery stenosis    COPD (chronic obstructive pulmonary disease) (HCC)    Hypertension    Mitral regurgitation    Mixed hyperlipidemia    Shoulder pain    Sleeping difficulties     Past Surgical History:  Procedure Laterality Date   BREAST BIOPSY Left 08/13/2018   affirm bx, x clip, path pending   CATARACT EXTRACTION Left    2017   CHOLECYSTECTOMY     COLONOSCOPY  2012   Dr Candace Cruise   COLONOSCOPY WITH PROPOFOL N/A 05/15/2021   Procedure: COLONOSCOPY WITH PROPOFOL;  Surgeon: Jonathon Bellows, MD;  Location: [facility] ENDOSCOPY;  Service: Gastroenterology;  Laterality: N/A;   LEFT  HEART CATH AND CORONARY ANGIOGRAPHY N/A 06/25/2017   Procedure: LEFT HEART CATH AND CORONARY ANGIOGRAPHY;  Surgeon: [name], MD;  Location: [facility] CV LAB;  Service: Cardiovascular;  Laterality: N/A;    Prior to Admission medications   Medication Sig Start Date End Date Taking? Authorizing Provider  acetaminophen (TYLENOL) 500 MG tablet Take 2 tablets (1,000 mg total) by mouth every 6 (six) hours as needed for mild pain. 02/19/21  Yes Piscoya, Jacqulyn Bath, MD  albuterol (VENTOLIN HFA) 108 (90 Base) MCG/ACT inhaler INHALE 2 PUFFS EVERY 4 HOURS FOR WHEEZING OR SHORTNESS OF BREATH AS NEEDED 10/31/21  Yes Karamalegos, Devonne Doughty, DO  amLODipine (NORVASC) 10 MG tablet TAKE 1 TABLET BY MOUTH ONCE DAILY 09/11/21  Yes Karamalegos, Devonne Doughty, DO  aspirin EC 81 MG tablet Take 1 tablet (81 mg total) by mouth daily. 03/12/17  Yes End, Harrell Gave, MD  fluticasone [facility]) 50 MCG/ACT nasal spray USE 2 SPRAYS INTO EACH NOSTRIL ONCE DAILY 06/05/22  Yes Karamalegos, Devonne Doughty, DO  hydrOXYzine (ATARAX/VISTARIL) 10 MG tablet Take 1 tablet (10 mg total) by mouth at bedtime as needed. 03/08/21  Yes Karamalegos, Devonne Doughty, DO  isosorbide mononitrate (IMDUR) 60 MG 24 hr tablet TAKE 1 TABLET BY MOUTH ONCE DAILY 06/04/22  Yes End, Harrell Gave, MD  losartan (COZAAR) 100 MG tablet Take 1 tablet (100 mg total)  by mouth daily. 09/11/21  Yes Karamalegos, Devonne Doughty, DO  meloxicam (MOBIC) 15 MG tablet TAKE 1 TABLET BY MOUTH ONCE DAILY AS NEEDED FOR PAIN WITH FOOD 11/28/21  Yes Karamalegos, Alexander J, DO  REPATHA SURECLICK 637 MG/ML SOAJ INJECT 1 DOSE INTO THE SKIN EVERY 14 DAYS 02/28/22  Yes End, Harrell Gave, MD  sertraline (ZOLOFT) 100 MG tablet Take 1 tablet (100 mg total) by mouth daily. 09/11/21  Yes Karamalegos, Devonne Doughty, DO  SPIRIVA HANDIHALER 18 MCG inhalation capsule INHALE CONTENTS OF 1 CAPSULE ONCE DAILY 11/28/21  Yes Karamalegos, Devonne Doughty, DO    Family History  Problem Relation Age of Onset   Cancer  Mother        bladder cancer   Heart disease Mother 38       Pacemaker   Cancer Father        lung   Multiple sclerosis Sister    Valvular heart disease Brother 56       s/p bioprosthetic valve replacement at [facility]   Breast cancer Neg Hx      Social History   Tobacco Use   Smoking status: Former    Packs/day: 0.50    Years: 30.00    Total pack years: 15.00    Types: Cigarettes    Quit date: 10/30/2020    Years since quitting: 1.6   Smokeless tobacco: Never   Tobacco comments:    Quit Jan 2022  Vaping Use   Vaping Use: Former  Substance Use Topics   Alcohol use: No    Alcohol/week: 0.0 standard drinks of alcohol   Drug use: No    Allergies as of 06/18/2022 - Review Complete 06/18/2022  Allergen Reaction Noted   Bactrim [sulfamethoxazole-trimethoprim] Nausea And Vomiting 12/23/2018   Aleve [naproxen sodium] Swelling 07/08/2015   Atorvastatin  03/13/2021   Crestor [rosuvastatin]  03/13/2021   Simvastatin  03/13/2021   Zetia [ezetimibe]  03/13/2021    Review of Systems:    All systems reviewed and negative except where noted in HPI.   Physical Exam:  BP (!) 152/68   Pulse 80   Temp 98.4 F (36.9 C) (Oral)   Ht '5\' 7"'$  (1.702 m)   Wt 171 lb 3.2 oz (77.7 kg)   BMI 26.81 kg/m  No LMP recorded. Patient is postmenopausal. Psych:  Alert and cooperative. Normal mood and affect. General:   Alert,  Well-developed, well-nourished, pleasant and cooperative in NAD Head:  Normocephalic and atraumatic. Eyes:  Sclera clear, no icterus.   Conjunctiva pink. Ears:  Normal auditory acuity. Abdomen:  Normal bowel sounds.  No bruits.  Soft, non-tender a, generalized distention without masses, hepatosplenomegaly or hernias noted.  No guarding or rebound tenderness.    Neurologic:  Alert and oriented x3;  grossly normal neurologically. Psych:  Alert and cooperative. Normal mood and affect.  Imaging Studies: No results found.  Assessment and Plan:   Kristin Parker is a 71 y.o. y/o  female has been referred for abdominal distention CAT scan shows no ascites or tumor.  There is moderate stool burden.  I explained to her that the abdominal distention is most likely a combination of stool burden and abdominal fat.  I explained to her that when she comes for colonoscopy for surveillance due to personal history of colon polyps that if her abdominal distention has resolved it is very likely that it is due to the stool burden as should have been cleaned out for the procedure otherwise it could be due  to the abdominal fat.  We will proceed with surveillance colonoscopy.  Commenced on MiraLAX 1 capful daily for constipation.  I have discussed alternative options, risks & benefits,  which include, but are not limited to, bleeding, infection, perforation,respiratory complication & drug reaction.  The patient agrees with this plan & written consent will be obtained.     Follow up in 3 to 4 months  Dr Jonathon Bellows MD,MRCP(U.K)

## 2022-06-20 ENCOUNTER — Other Ambulatory Visit: Payer: Self-pay | Admitting: Family Medicine

## 2022-06-20 DIAGNOSIS — I10 Essential (primary) hypertension: Secondary | ICD-10-CM

## 2022-06-20 DIAGNOSIS — G8929 Other chronic pain: Secondary | ICD-10-CM

## 2022-06-20 NOTE — Telephone Encounter (Signed)
Requested Prescriptions  Pending Prescriptions Disp Refills  . meloxicam (MOBIC) 15 MG tablet [Pharmacy Med Name: MELOXICAM 15 MG TAB] 30 tablet 5    Sig: TAKE 1 TABLET BY MOUTH ONCE DAILY AS NEEDED FOR PAIN WITH FOOD     Analgesics:  COX2 Inhibitors Failed - 06/20/2022 10:46 AM      Failed - Manual Review: Labs are only required if the patient has taken medication for more than 8 weeks.      Passed - HGB in normal range and within 360 days    Hemoglobin  Date Value Ref Range Status  09/06/2021 14.9 11.7 - 15.5 g/dL Final  07/22/2019 14.6 11.1 - 15.9 g/dL Final         Passed - Cr in normal range and within 360 days    Creat  Date Value Ref Range Status  09/06/2021 0.82 0.60 - 1.00 mg/dL Final         Passed - HCT in normal range and within 360 days    HCT  Date Value Ref Range Status  09/06/2021 44.0 35.0 - 45.0 % Final   Hematocrit  Date Value Ref Range Status  07/22/2019 42.1 34.0 - 46.6 % Final         Passed - AST in normal range and within 360 days    AST  Date Value Ref Range Status  09/06/2021 15 10 - 35 U/L Final         Passed - ALT in normal range and within 360 days    ALT  Date Value Ref Range Status  09/06/2021 14 6 - 29 U/L Final         Passed - eGFR is 30 or above and within 360 days    GFR, Est African American  Date Value Ref Range Status  03/01/2021 69 > OR = 60 mL/min/1.65m2 Final   GFR, Est Non African American  Date Value Ref Range Status  03/01/2021 60 > OR = 60 mL/min/1.14m2 Final   eGFR  Date Value Ref Range Status  09/06/2021 76 > OR = 60 mL/min/1.45m2 Final    Comment:    The eGFR is based on the CKD-EPI 2021 equation. To calculate  the new eGFR from a previous Creatinine or Cystatin C result, go to https://www.kidney.org/professionals/ kdoqi/gfr%5Fcalculator          Passed - Patient is not pregnant      Passed - Valid encounter within last 12 months    Recent Outpatient Visits          5 months ago COPD with acute  exacerbation ([place])   [facility] Olin Hauser, DO   8 months ago Osteoarthritis of glenohumeral joint, right   [place], DO   9 months ago Annual physical exam   [place], DO   1 year ago Elevated hemoglobin A1c   [place], Devonne Doughty, DO   1 year ago Distended abdomen   [place], DO             . amLODipine (NORVASC) 10 MG tablet [Pharmacy Med Name: AMLODIPINE BESYLATE 10 MG TAB] 90 tablet 0    Sig: TAKE 1 TABLET BY MOUTH ONCE DAILY     Cardiovascular: Calcium Channel Blockers 2 Failed - 06/20/2022 10:46 AM      Failed - Last BP in normal range    BP  Readings from Last 1 Encounters:  06/18/22 (!) 152/68         Passed - Last Heart Rate in normal range    Pulse Readings from Last 1 Encounters:  06/18/22 80         Passed - Valid encounter within last 6 months    Recent Outpatient Visits          5 months ago COPD with acute exacerbation [facility])   [place], DO   8 months ago Osteoarthritis of glenohumeral joint, right   [place], DO   9 months ago Annual physical exam   [place], DO   1 year ago Elevated hemoglobin A1c   [place], DO   1 year ago Distended abdomen   [place], Devonne Doughty, [place]

## 2022-06-29 NOTE — Addendum Note (Signed)
Addended by: Wayna Chalet on: 06/29/2022 12:34 PM   Modules accepted: Orders

## 2022-07-03 ENCOUNTER — Ambulatory Visit
Admission: RE | Admit: 2022-07-03 | Discharge: 2022-07-03 | Disposition: A | Discharge status: Home / Self Care | Payer: Medicare HMO | Attending: Gastroenterology | Admitting: Gastroenterology

## 2022-07-03 ENCOUNTER — Ambulatory Visit: Payer: Medicare HMO | Admitting: Anesthesiology

## 2022-07-03 ENCOUNTER — Other Ambulatory Visit: Payer: Self-pay | Admitting: Family Medicine

## 2022-07-03 ENCOUNTER — Encounter
Admission: RE | Disposition: A | Discharge status: Home / Self Care | Payer: Self-pay | Source: Home / Self Care | Attending: Gastroenterology

## 2022-07-03 ENCOUNTER — Encounter: Payer: Self-pay | Admitting: Gastroenterology

## 2022-07-03 DIAGNOSIS — I25119 Atherosclerotic heart disease of native coronary artery with unspecified angina pectoris: Secondary | ICD-10-CM | POA: Insufficient documentation

## 2022-07-03 DIAGNOSIS — Z09 Encounter for follow-up examination after completed treatment for conditions other than malignant neoplasm: Secondary | ICD-10-CM | POA: Diagnosis present

## 2022-07-03 DIAGNOSIS — Z860100 Personal history of colon polyps, unspecified: Secondary | ICD-10-CM

## 2022-07-03 DIAGNOSIS — I6529 Occlusion and stenosis of unspecified carotid artery: Secondary | ICD-10-CM | POA: Insufficient documentation

## 2022-07-03 DIAGNOSIS — F419 Anxiety disorder, unspecified: Secondary | ICD-10-CM | POA: Insufficient documentation

## 2022-07-03 DIAGNOSIS — K635 Polyp of colon: Secondary | ICD-10-CM | POA: Diagnosis not present

## 2022-07-03 DIAGNOSIS — J449 Chronic obstructive pulmonary disease, unspecified: Secondary | ICD-10-CM | POA: Diagnosis not present

## 2022-07-03 DIAGNOSIS — I080 Rheumatic disorders of both mitral and aortic valves: Secondary | ICD-10-CM | POA: Insufficient documentation

## 2022-07-03 DIAGNOSIS — D126 Benign neoplasm of colon, unspecified: Secondary | ICD-10-CM | POA: Diagnosis not present

## 2022-07-03 DIAGNOSIS — Z9049 Acquired absence of other specified parts of digestive tract: Secondary | ICD-10-CM | POA: Diagnosis not present

## 2022-07-03 DIAGNOSIS — Z87891 Personal history of nicotine dependence: Secondary | ICD-10-CM | POA: Insufficient documentation

## 2022-07-03 DIAGNOSIS — Z79899 Other long term (current) drug therapy: Secondary | ICD-10-CM | POA: Insufficient documentation

## 2022-07-03 DIAGNOSIS — D128 Benign neoplasm of rectum: Secondary | ICD-10-CM | POA: Insufficient documentation

## 2022-07-03 DIAGNOSIS — I10 Essential (primary) hypertension: Secondary | ICD-10-CM | POA: Diagnosis not present

## 2022-07-03 DIAGNOSIS — J432 Centrilobular emphysema: Secondary | ICD-10-CM

## 2022-07-03 DIAGNOSIS — D122 Benign neoplasm of ascending colon: Secondary | ICD-10-CM | POA: Diagnosis not present

## 2022-07-03 HISTORY — PX: COLONOSCOPY WITH PROPOFOL: SHX5780

## 2022-07-03 SURGERY — COLONOSCOPY WITH PROPOFOL
Anesthesia: General

## 2022-07-03 MED ORDER — SODIUM CHLORIDE 0.9 % IV SOLN
INTRAVENOUS | Status: DC
  Administered 2022-07-03: 1000 mL via INTRAVENOUS

## 2022-07-03 MED ORDER — ONDANSETRON HCL 4 MG/2ML IJ SOLN
4.0000 mg | Freq: Once | INTRAMUSCULAR | Status: AC
Start: 2022-07-03 — End: 2022-07-03

## 2022-07-03 MED ORDER — PROPOFOL 10 MG/ML IV BOLUS
INTRAVENOUS | Status: DC | PRN
  Administered 2022-07-03: 80 mg via INTRAVENOUS

## 2022-07-03 MED ORDER — PROPOFOL 500 MG/50ML IV EMUL
INTRAVENOUS | Status: DC | PRN
  Administered 2022-07-03: 200 ug/kg/min via INTRAVENOUS

## 2022-07-03 MED ORDER — ONDANSETRON HCL 4 MG/2ML IJ SOLN
INTRAMUSCULAR | Status: AC
  Administered 2022-07-03: 4 mg via INTRAVENOUS
  Filled 2022-07-03: qty 2

## 2022-07-03 MED ORDER — SODIUM CHLORIDE 0.9 % IV SOLN: INTRAVENOUS | Status: DC | PRN

## 2022-07-03 NOTE — H&P (Signed)
Jonathon Bellows, MD [address], [place], [place], [place], [address] [address], [place], [place], [place], [address] Phone: [phone]  Fax: [phone]  Primary Care Physician:  Olin Hauser, DO   Pre-Procedure History & Physical: HPI:  Kristin Parker is a 71 y.o. female is here for an colonoscopy.   Past Medical History:  Diagnosis Date   Anxiety    Aortic valve sclerosis    Bilateral carotid bruits    Carotid artery stenosis    COPD (chronic obstructive pulmonary disease) (HCC)    Hypertension    Mitral regurgitation    Mixed hyperlipidemia    Shoulder pain    Sleeping difficulties     Past Surgical History:  Procedure Laterality Date   BREAST BIOPSY Left 08/13/2018   affirm bx, x clip, path pending   CARDIAC CATHETERIZATION     CATARACT EXTRACTION Left    2017   CHOLECYSTECTOMY     COLONOSCOPY  2012   Dr Candace Cruise   COLONOSCOPY WITH PROPOFOL N/A 05/15/2021   Procedure: COLONOSCOPY WITH PROPOFOL;  Surgeon: Jonathon Bellows, MD;  Location: [facility] ENDOSCOPY;  Service: Gastroenterology;  Laterality: N/A;   EYE SURGERY     LEFT HEART CATH AND CORONARY ANGIOGRAPHY N/A 06/25/2017   Procedure: LEFT HEART CATH AND CORONARY ANGIOGRAPHY;  Surgeon: [name], MD;  Location: [facility] CV LAB;  Service: Cardiovascular;  Laterality: N/A;    Prior to Admission medications   Medication Sig Start Date End Date Taking? Authorizing Provider  amLODipine (NORVASC) 10 MG tablet TAKE 1 TABLET BY MOUTH ONCE DAILY 06/20/22  Yes Karamalegos, Devonne Doughty, DO  aspirin EC 81 MG tablet Take 1 tablet (81 mg total) by mouth daily. 03/12/17  Yes [place], Harrell Gave, MD  fluticasone [facility]) 50 MCG/ACT nasal spray USE 2 SPRAYS INTO EACH NOSTRIL ONCE DAILY 06/05/22  Yes Karamalegos, Devonne Doughty, DO  hydrOXYzine (ATARAX/VISTARIL) 10 MG tablet Take 1 tablet (10 mg total) by mouth at bedtime as needed. 03/08/21  Yes Karamalegos, Devonne Doughty, DO  isosorbide mononitrate (IMDUR) 60 MG 24 hr  tablet TAKE 1 TABLET BY MOUTH ONCE DAILY 06/04/22  Yes End, Harrell Gave, MD  losartan (COZAAR) 100 MG tablet Take 1 tablet (100 mg total) by mouth daily. 09/11/21  Yes Karamalegos, Devonne Doughty, DO  meloxicam (MOBIC) 15 MG tablet TAKE 1 TABLET BY MOUTH ONCE DAILY AS NEEDED FOR PAIN WITH FOOD 06/20/22  Yes Karamalegos, Alexander J, DO  REPATHA SURECLICK 419 MG/ML SOAJ INJECT 1 DOSE INTO THE SKIN EVERY 14 DAYS 02/28/22  Yes End, Harrell Gave, MD  sertraline (ZOLOFT) 100 MG tablet Take 1 tablet (100 mg total) by mouth daily. 09/11/21  Yes Karamalegos, Devonne Doughty, DO  Sod Picosulfate-Mag Ox-Cit Acd (CLENPIQ) 10-3.5-12 MG-GM -GM/160ML SOLN Take 1 bottle at 5 PM followed by five 8 oz cups of water and repeat 5 hours before procedure. 06/18/22  Yes Jonathon Bellows, MD  SPIRIVA HANDIHALER 18 MCG inhalation capsule INHALE CONTENTS OF 1 CAPSULE ONCE DAILY 11/28/21  Yes Karamalegos, Devonne Doughty, DO  acetaminophen (TYLENOL) 500 MG tablet Take 2 tablets (1,000 mg total) by mouth every 6 (six) hours as needed for mild pain. 02/19/21   Olean Ree, MD  albuterol (VENTOLIN HFA) 108 (90 Base) MCG/ACT inhaler INHALE 2 PUFFS EVERY 4 HOURS FOR WHEEZING OR SHORTNESS OF BREATH AS NEEDED 10/31/21   Olin Hauser, DO    Allergies as of 06/29/2022 - Review Complete 06/29/2022  Allergen Reaction Noted   Bactrim [sulfamethoxazole-trimethoprim] Nausea And Vomiting 12/23/2018  Aleve [naproxen sodium] Swelling 07/08/2015   Atorvastatin  03/13/2021   Crestor [rosuvastatin]  03/13/2021   Simvastatin  03/13/2021   Zetia [ezetimibe]  03/13/2021    Family History  Problem Relation Age of Onset   Cancer Mother        bladder cancer   Heart disease Mother 16       Pacemaker   Cancer Father        lung   Multiple sclerosis Sister    Valvular heart disease Brother 74       s/p bioprosthetic valve replacement at [facility]   Breast cancer Neg Hx     Social History   Socioeconomic History   Marital status: Divorced    Spouse  name: Not on file   Number of children: Not on file   Years of education: Not on file   Highest education level: GED or equivalent  Occupational History   Occupation: retired   Tobacco Use   Smoking status: Former    Packs/day: 0.50    Years: 30.00    Total pack years: 15.00    Types: Cigarettes    Quit date: 10/30/2020    Years since quitting: 1.6   Smokeless tobacco: Never   Tobacco comments:    Quit Jan 2022  Vaping Use   Vaping Use: Former  Substance and Sexual Activity   Alcohol use: No    Alcohol/week: 0.0 standard drinks of alcohol   Drug use: No   Sexual activity: Not on file  Other Topics Concern   Not on file  Social History Narrative   Not on file   Social Determinants of Health   Financial Resource Strain: Low Risk  (01/05/2022)   Overall Financial Resource Strain (CARDIA)    Difficulty of Paying Living Expenses: Not hard at all  Food Insecurity: No Food Insecurity (01/05/2022)   Hunger Vital Sign    Worried About Running Out of Food in the Last Year: Never true    [place] in the Last Year: Never true  Transportation Needs: No Transportation Needs (01/05/2022)   PRAPARE - Hydrologist (Medical): No    Lack of Transportation (Non-Medical): No  Physical Activity: Insufficiently Active (01/05/2022)   Exercise Vital Sign    Days of Exercise per Week: 4 days    Minutes of Exercise per Session: 30 min  Stress: No Stress Concern Present (01/05/2022)   [place]    Feeling of Stress : Only a little  Social Connections: Socially Isolated (01/05/2022)   Social Connection and Isolation Panel [NHANES]    Frequency of Communication with Friends and Family: More than three times a week    Frequency of Social Gatherings with Friends and Family: More than three times a week    Attends Religious Services: Never    Marine scientist or Organizations: No    Attends English as a second language teacher Meetings: Never    Marital Status: Divorced  Human resources officer Violence: Not At Risk (01/05/2022)   Humiliation, Afraid, Rape, and Kick questionnaire    Fear of Current or Ex-Partner: No    Emotionally Abused: No    Physically Abused: No    Sexually Abused: No    Review of Systems: See HPI, otherwise negative ROS  Physical Exam: There were no vitals taken for this visit. General:   Alert,  pleasant and cooperative in NAD Head:  Normocephalic and atraumatic. Neck:  Supple; no masses or thyromegaly. Lungs:  Clear throughout to auscultation, normal respiratory effort.    Heart:  +S1, +S2, Regular rate and rhythm, No edema. Abdomen:  Soft, nontender and nondistended. Normal bowel sounds, without guarding, and without rebound.   Neurologic:  Alert and  oriented x4;  grossly normal neurologically.  Impression/Plan: Kristin Parker is here for an colonoscopy to be performed for surveillance due to prior history of colon polyps   Risks, benefits, limitations, and alternatives regarding  colonoscopy have been reviewed with the patient.  Questions have been answered.  All parties agreeable.   Jonathon Bellows, MD  07/03/2022, 10:06 AM

## 2022-07-03 NOTE — Op Note (Signed)
[facility] Gastroenterology Patient Name: Kristin Parker Procedure Date: 07/03/2022 10:27 AM MRN: [id number] Account #: [id number] Date of Birth: 02/18/1951 Admit Type: Outpatient Age: 71 Room: [facility] ENDO ROOM 2 Gender: Female Note Status: Finalized Instrument Name: Park Meo [id number] Procedure:             Colonoscopy Indications:           Surveillance: History of numerous (> 10) adenomas on                         last colonoscopy (< 3 yrs) Providers:             Jonathon Bellows MD, MD Referring MD:          Olin Hauser (Referring MD) Medicines:             Monitored Anesthesia Care Complications:         No immediate complications. Procedure:             Pre-Anesthesia Assessment:                        - Prior to the procedure, a History and Physical was                         performed, and patient medications, allergies and                         sensitivities were reviewed. The patient's tolerance                         of previous anesthesia was reviewed.                        - The risks and benefits of the procedure and the                         sedation options and risks were discussed with the                         patient. All questions were answered and informed                         consent was obtained.                        - ASA Grade Assessment: II - A patient with mild                         systemic disease.                        After obtaining informed consent, the colonoscope was                         passed under direct vision. Throughout the procedure,                         the patient's blood pressure, pulse, and oxygen  saturations were monitored continuously. The                         Colonoscope was introduced through the anus and                         advanced to the the cecum, identified by the                         appendiceal orifice. The colonoscopy was performed                          with ease. The patient tolerated the procedure well.                         The quality of the bowel preparation was good. Findings:      The perianal and digital rectal examinations were normal.      Two sessile polyps were found in the rectum and ascending colon. The       polyps were 4 to 5 mm in size. These polyps were removed with a cold       snare. Resection and retrieval were complete.      The exam was otherwise without abnormality on direct and retroflexion       views. Impression:            - Two 4 to 5 mm polyps in the rectum and in the                         ascending colon, removed with a cold snare. Resected                         and retrieved.                        - The examination was otherwise normal on direct and                         retroflexion views. Recommendation:        - Discharge patient to home (with escort).                        - Resume previous diet.                        - Continue present medications.                        - Await pathology results.                        - Repeat colonoscopy in 3 years for surveillance. Procedure Code(s):     --- Professional ---                        [phone], Colonoscopy, flexible; with removal of                         tumor(s), polyp(s), or other lesion(s) by snare  technique Diagnosis Code(s):     --- Professional ---                        Z86.010, Personal history of colonic polyps                        K63.5, Polyp of colon                        K62.1, Rectal polyp CPT copyright 2019 American Medical Association. All rights reserved. The codes documented in this report are preliminary and upon coder review may  be revised to meet current compliance requirements. Jonathon Bellows, MD Jonathon Bellows MD, MD 07/03/2022 10:48:26 AM This report has been signed electronically. Number of Addenda: 0 Note Initiated On: 07/03/2022 10:27 AM Scope Withdrawal Time: 0 hours 9 minutes 21  seconds  Total Procedure Duration: 0 hours 14 minutes 26 seconds  Estimated Blood Loss:  Estimated blood loss: none.      [facility]

## 2022-07-03 NOTE — Transfer of Care (Signed)
Immediate Anesthesia Transfer of Care Note  Patient: Kristin Parker  Procedure(s) Performed: COLONOSCOPY WITH PROPOFOL  Patient Location: PACU  Anesthesia Type:General  Level of Consciousness: sedated  Airway & Oxygen Therapy: Patient Spontanous Breathing and Patient connected to nasal cannula oxygen  Post-op Assessment: Report given to RN and Post -op Vital signs reviewed and stable  Post vital signs: Reviewed and stable  Last Vitals:  Vitals Value Taken Time  BP 131/60 07/03/22 1050  Temp    Pulse 69 07/03/22 1051  Resp 10 07/03/22 1051  SpO2 95 % 07/03/22 1051  Vitals shown include unvalidated device data.  Last Pain:  Vitals:   07/03/22 1050  TempSrc: (P) Temporal  PainSc:          Complications: No notable events documented.

## 2022-07-03 NOTE — Anesthesia Postprocedure Evaluation (Signed)
Anesthesia Post Note  Patient: Kristin Parker  Procedure(s) Performed: COLONOSCOPY WITH PROPOFOL  Patient location during evaluation: PACU Anesthesia Type: General Level of consciousness: awake and alert Pain management: pain level controlled Vital Signs Assessment: post-procedure vital signs reviewed and stable Respiratory status: spontaneous breathing, nonlabored ventilation, respiratory function stable and patient connected to nasal cannula oxygen Cardiovascular status: blood pressure returned to baseline and stable Postop Assessment: no apparent nausea or vomiting Anesthetic complications: no   No notable events documented.   Last Vitals:  Vitals:   07/03/22 1100 07/03/22 1110  BP: 134/75 (!) 158/72  Pulse: 72 68  Resp: 15 17  Temp:    SpO2: 94% 96%    Last Pain:  Vitals:   07/03/22 1110  TempSrc:   PainSc: 0-No pain                 Molli Barrows

## 2022-07-03 NOTE — Anesthesia Preprocedure Evaluation (Signed)
Anesthesia Evaluation  Patient identified by MRN, date of birth, ID band Patient awake    Reviewed: Allergy & Precautions, H&P , NPO status , Patient's Chart, lab work & pertinent test results, reviewed documented beta blocker date and time   Airway Mallampati: II   Neck ROM: full    Dental  (+) Poor Dentition   Pulmonary shortness of breath, COPD,  COPD inhaler, former smoker,    Pulmonary exam normal        Cardiovascular Exercise Tolerance: Poor hypertension, On Medications + angina with exertion + CAD  Normal cardiovascular exam+ Valvular Problems/Murmurs  Rhythm:regular Rate:Normal     Neuro/Psych Anxiety  Neuromuscular disease negative psych ROS   GI/Hepatic negative GI ROS, Neg liver ROS,   Endo/Other  negative endocrine ROS  Renal/GU negative Renal ROS  negative genitourinary   Musculoskeletal   Abdominal   Peds  Hematology negative hematology ROS (+)   Anesthesia Other Findings Past Medical History: No date: Anxiety No date: Aortic valve sclerosis No date: Bilateral carotid bruits No date: Carotid artery stenosis No date: COPD (chronic obstructive pulmonary disease) (HCC) No date: Hypertension No date: Mitral regurgitation No date: Mixed hyperlipidemia No date: Shoulder pain No date: Sleeping difficulties Past Surgical History: 08/13/2018: BREAST BIOPSY; Left     Comment:  affirm bx, x clip, path pending No date: CARDIAC CATHETERIZATION No date: CATARACT EXTRACTION; Left     Comment:  2017 No date: CHOLECYSTECTOMY 2012: COLONOSCOPY     Comment:  Dr Candace Cruise 05/15/2021: COLONOSCOPY WITH PROPOFOL; N/A     Comment:  Procedure: COLONOSCOPY WITH PROPOFOL;  Surgeon: Jonathon Bellows, MD;  Location: [facility] ENDOSCOPY;  Service:               Gastroenterology;  Laterality: N/A; No date: EYE SURGERY 06/25/2017: LEFT HEART CATH AND CORONARY ANGIOGRAPHY; N/A     Comment:  Procedure: LEFT HEART CATH AND  CORONARY ANGIOGRAPHY;                Surgeon: [name], MD;  Location: [facility]               CV LAB;  Service: Cardiovascular;  Laterality: N/A; BMI    Body Mass Index: 26.84 kg/m     Reproductive/Obstetrics negative OB ROS                             Anesthesia Physical Anesthesia Plan  ASA: 3  Anesthesia Plan: General   Post-op Pain Management:    Induction:   PONV Risk Score and Plan:   Airway Management Planned:   Additional Equipment:   Intra-op Plan:   Post-operative Plan:   Informed Consent: I have reviewed the patients History and Physical, chart, labs and discussed the procedure including the risks, benefits and alternatives for the proposed anesthesia with the patient or authorized representative who has indicated his/her understanding and acceptance.     Dental Advisory Given  Plan Discussed with: CRNA  Anesthesia Plan Comments:         Anesthesia Quick Evaluation

## 2022-07-03 NOTE — Anesthesia Procedure Notes (Signed)
Date/Time: 07/03/2022 10:28 AM  Performed by: Nelda Marseille, CRNAPre-anesthesia Checklist: Patient identified, Emergency Drugs available, Suction available, Patient being monitored and Timeout performed Oxygen Delivery Method: Nasal cannula

## 2022-07-04 ENCOUNTER — Encounter: Payer: Self-pay | Admitting: Gastroenterology

## 2022-07-04 LAB — SURGICAL PATHOLOGY

## 2022-07-05 ENCOUNTER — Encounter: Payer: Self-pay | Admitting: Gastroenterology

## 2022-07-05 NOTE — Telephone Encounter (Signed)
Requested Prescriptions  Pending Prescriptions Disp Refills  . SPIRIVA HANDIHALER 18 MCG inhalation capsule [Pharmacy Med Name: SPIRIVA HANDIHALER 18 MCG INH CAP] 30 capsule 5    Sig: INHALE CONTENTS OF 1 CAPSULE ONCE DAILY     Pulmonology:  Anticholinergic Agents Passed - 07/03/2022  5:30 PM      Passed - Valid encounter within last 12 months    Recent Outpatient Visits          6 months ago COPD with acute exacerbation [facility])   [place], DO   8 months ago Osteoarthritis of glenohumeral joint, right   [place], DO   9 months ago Annual physical exam   [place], DO   1 year ago Elevated hemoglobin A1c   [place], DO   1 year ago Distended abdomen   [place], Devonne Doughty, [place]

## 2022-08-31 ENCOUNTER — Other Ambulatory Visit: Payer: Self-pay | Admitting: Family Medicine

## 2022-08-31 ENCOUNTER — Other Ambulatory Visit: Payer: Self-pay | Admitting: Internal Medicine

## 2022-08-31 DIAGNOSIS — I10 Essential (primary) hypertension: Secondary | ICD-10-CM

## 2022-08-31 NOTE — Telephone Encounter (Signed)
Requested Prescriptions  Pending Prescriptions Disp Refills   amLODipine (NORVASC) 10 MG tablet [Pharmacy Med Name: AMLODIPINE BESYLATE 10 MG TAB] 90 tablet 0    Sig: TAKE 1 TABLET BY MOUTH ONCE DAILY     Cardiovascular: Calcium Channel Blockers 2 Failed - 08/31/2022 11:39 AM      Failed - Last BP in normal range    BP Readings from Last 1 Encounters:  07/03/22 (!) 158/72         Failed - Valid encounter within last 6 months    Recent Outpatient Visits           7 months ago COPD with acute exacerbation [facility])   [place], DO   10 months ago Osteoarthritis of glenohumeral joint, right   [facility] Olin Hauser, DO   11 months ago Annual physical exam   [place], DO   1 year ago Elevated hemoglobin A1c   [place], DO   1 year ago Distended abdomen   [place], Devonne Doughty, DO       Future Appointments             In 1 week [place], Devonne Doughty, [facility], [place]   In 1 month End, Harrell Gave, MD [place]. [facility]            Passed - Last Heart Rate in normal range    Pulse Readings from Last 1 Encounters:  07/03/22 68

## 2022-09-10 ENCOUNTER — Encounter: Payer: Self-pay | Admitting: Family Medicine

## 2022-09-10 ENCOUNTER — Ambulatory Visit (INDEPENDENT_AMBULATORY_CARE_PROVIDER_SITE_OTHER): Payer: Medicare HMO | Admitting: Family Medicine

## 2022-09-10 VITALS — BP 104/57 | HR 81 | Ht 67.0 in | Wt 169.5 lb

## 2022-09-10 DIAGNOSIS — I10 Essential (primary) hypertension: Secondary | ICD-10-CM

## 2022-09-10 DIAGNOSIS — L239 Allergic contact dermatitis, unspecified cause: Secondary | ICD-10-CM | POA: Diagnosis not present

## 2022-09-10 DIAGNOSIS — J432 Centrilobular emphysema: Secondary | ICD-10-CM

## 2022-09-10 DIAGNOSIS — I25118 Atherosclerotic heart disease of native coronary artery with other forms of angina pectoris: Secondary | ICD-10-CM

## 2022-09-10 DIAGNOSIS — F419 Anxiety disorder, unspecified: Secondary | ICD-10-CM | POA: Diagnosis not present

## 2022-09-10 MED ORDER — BREZTRI AEROSPHERE 160-9-4.8 MCG/ACT IN AERO: 2.0000 | INHALATION_SPRAY | Freq: Two times a day (BID) | RESPIRATORY_TRACT | 11 refills | Status: DC

## 2022-09-10 MED ORDER — SERTRALINE HCL 100 MG PO TABS: 100.0000 mg | ORAL_TABLET | Freq: Every day | ORAL | 3 refills | Status: DC

## 2022-09-10 MED ORDER — BUSPIRONE HCL 5 MG PO TABS: 5.0000 mg | ORAL_TABLET | Freq: Three times a day (TID) | ORAL | 3 refills | Status: DC | PRN

## 2022-09-10 MED ORDER — LOSARTAN POTASSIUM 100 MG PO TABS: 100.0000 mg | ORAL_TABLET | Freq: Every day | ORAL | 3 refills | Status: DC

## 2022-09-10 MED ORDER — AMLODIPINE BESYLATE 10 MG PO TABS: 10.0000 mg | ORAL_TABLET | Freq: Every day | ORAL | 3 refills | Status: DC

## 2022-09-10 NOTE — Assessment & Plan Note (Signed)
Stable COPD without acute flare Former smoker, quit last year Failed Spiriva, limited result Will start Breztri 2 puff TWICE A DAY new rx sent  Rarely using PRN Albuterol Future Pulm ref

## 2022-09-10 NOTE — Patient Instructions (Addendum)
Thank you for coming to the office today.  Stop Spiriva Start Breztri 2 puffs twice a day  Start the Buspar '5mg'$  up to 3 times a day max, can take 1 to 2 per dose, for anxiety, use only when needed and keep it on a scheduled regimen, it can help you rest and sleep.  Hydroxyzine can be taken 1-2 per dose at night if need  We can switch Sertraline to other version next time if not improved.  ----------------------------  Call insurance find cost and coverage of the following - check the following: - Drug Tier, Preferred List, On Formulary - All will require a "Prior Authorization" from [place] first, before you can find out the cost - Find out if there is "Step Therapy" (other medicines required before you can try these)  Once you pick the one you want to try, let me know - we can get a sample ready IF we have it in stock. Then try it - and before running out of medicine, contact me back to order your Rx so we have time to get it processed.  For Weight Loss / Obesity only  Wegovy (same as Ozempic) weekly injection - start 0.'25mg'$  weekly, 1 dose per pen, single use, auto-injector  2. Saxenda - DAILY injection - start 0.'6mg'$  injection DAILY, you can increase the dose by 1 notch or 0.6 mg per week, if you don't tolerate a dose, can reduce it the next day.  3. Contrave - oral medication, appetite suppression has wellbutrin/bupropion and naltrexone in it and it can also help with appetite, it is ordered through a speciality pharmacy.  Future make sure your insurance has weight loss coverage  WEIGHT MANAGEMENT  Dr Dennard Nip  [facility] [place], [place] [address] Ph: [phone]   Please schedule a Follow-up Appointment to: Return in about 3 months (around 12/11/2022) for 3 month follow up anxiety, sleep, COPD, updates.  If you have any other questions or concerns, please feel free to call the office or send a message through [place]. You may  also schedule an earlier appointment if necessary.  Additionally, you may be receiving a survey about your experience at our office within a few days to 1 week by e-mail or mail. We value your feedback.  Kristin Putnam, DO [place]

## 2022-09-10 NOTE — Assessment & Plan Note (Signed)
Controlled HTN on current regimen - Home BP readings stable Complication with Mild-Mod CAD / PAD (carotid) OFF Thiazide, BB Followed by Cardiology  Plan:  1. Continue current BP regimen Amlodipine '5mg'$  daily, Losartan '100mg'$ , Imdur '60mg'$  nightly - per chart review, patient to check bottles at home, she cannot recall med rec 2. Encourage improved lifestyle - low sodium diet, improve regular exercise 3. Continue monitor BP outside office, bring readings to next visit, if persistently >140/90 or new symptoms notify office sooner

## 2022-09-10 NOTE — Progress Notes (Signed)
Subjective:    Patient ID: Kristin Parker, female    DOB: 05/02/1951, 71 y.o.   MRN: [id number]  Kristin Parker is a 71 y.o. female presenting on 09/10/2022 for Hypertension and Hyperlipidemia   HPI  Elevated A1c Prior A1c 5.4 to 5.7 Now recent A1c to 5.6  Meds: Never on med Currently on [facility] Lifestyle: - Diet (admits eats cookies and some sweets, not following low carb diet) - Exercise (limited) Denies hypoglycemia   CHRONIC HTN: Reports no concerns Current Meds - Losartan '100mg'$  daily, Amlodipine '10mg'$  daily Reports good compliance, took meds today. Tolerating well, w/o complaints. Denies CP, dyspnea, HA, edema, dizziness / lightheadedness   Hyperlipidemia Followed by [facility] Failed multiple meds in past including statins intermittent statin, rosuvastatin, recently zetia. Had myalgia. History of myalgia on statin, with drug induced myopathy could not tolerate She is doing great on Repatha SureClick, now LDL down from >200 to 160 to 92     COPD / Tobacco Abuse Former smoker now   Centrilobular Emphysema Previously on Spiriva Asking about change, limited effectiveness   Anxiety / Insomnia Continues on Sertraline '100mg'$  daily with good results. - needs refill Takes Hydroxyzine '10mg'$  AT NIGHT AS NEEDED to help with itching and then later in evening and night mind keeps going. anxiety keeps her awake, asking about other med Failed Trazodone.   FOLLOW-UP Chronic R shoulder pain Prior history subacromial injection with improvement. Due for repeat now, she declines orthopedic Limited with range of motion above head Wants to delay surgery  Abnormal central weight gain former smoker    S/p cholecystectomy - robotic surgery Done by Dr Hampton Abbot ASA      Health Maintenance:  Declines Mammo and DEXA   Colonoscopy completed 05/15/21 by Dr Jonathon Bellows, had 11-12 polyps removed, Last colonoscopy 06/2022, repeat 3 years. 2026   Has followed with OBGYN  Philip Aspen CNM, she ordered Mammogram 02/2021.     01/05/2022   10:29 AM 09/11/2021    2:19 PM 03/22/2021    9:34 AM  Depression screen PHQ 2/9  Decreased Interest 0 0 0  Down, Depressed, Hopeless 0 0 0  PHQ - 2 Score 0 0 0  Altered sleeping  1   Tired, decreased energy  0   Change in appetite  0   Feeling bad or failure about yourself   0   Trouble concentrating  0   Moving slowly or fidgety/restless  0   Suicidal thoughts  0   PHQ-9 Score  1   Difficult doing work/chores  Somewhat difficult     Social History   Tobacco Use   Smoking status: Former    Packs/day: 0.50    Years: 30.00    Total pack years: 15.00    Types: Cigarettes    Quit date: 10/30/2020    Years since quitting: 1.8   Smokeless tobacco: Never   Tobacco comments:    Quit Jan 2022  Vaping Use   Vaping Use: Former  Substance Use Topics   Alcohol use: No    Alcohol/week: 0.0 standard drinks of alcohol   Drug use: No    Review of Systems Per HPI unless specifically indicated above     Objective:    BP (!) 104/57   Pulse 81   Ht '5\' 7"'$  (1.702 m)   Wt 169 lb 8 oz (76.9 kg)   SpO2 96%   BMI 26.55 kg/m   Wt Readings from Last 3 Encounters:  09/10/22 169 lb 8 oz (76.9 kg)  07/03/22 171 lb 5.5 oz (77.7 kg)  06/18/22 171 lb 3.2 oz (77.7 kg)    Physical Exam Vitals and nursing note reviewed.  Constitutional:      General: She is not in acute distress.    Appearance: Normal appearance. She is well-developed. She is not diaphoretic.     Comments: Well-appearing, comfortable, cooperative  HENT:     Head: Normocephalic and atraumatic.  Eyes:     General:        Right eye: No discharge.        Left eye: No discharge.     Conjunctiva/sclera: Conjunctivae normal.  Neck:     Thyroid: No thyromegaly.  Cardiovascular:     Rate and Rhythm: Normal rate and regular rhythm.     Heart sounds: Normal heart sounds. No murmur heard. Pulmonary:     Effort: Pulmonary effort is normal. No respiratory  distress.     Breath sounds: Normal breath sounds. No wheezing or rales.  Musculoskeletal:        General: Normal range of motion.     Cervical back: Normal range of motion and neck supple.  Lymphadenopathy:     Cervical: No cervical adenopathy.  Skin:    General: Skin is warm and dry.     Findings: No erythema or rash.  Neurological:     Mental Status: She is alert and oriented to person, place, and time.  Psychiatric:        Mood and Affect: Mood normal.        Behavior: Behavior normal.        Thought Content: Thought content normal.     Comments: Well groomed, good eye contact, normal speech and thoughts       Results for orders placed or performed during the hospital encounter of 07/03/22  Surgical pathology  Result Value Ref Range   SURGICAL PATHOLOGY      SURGICAL PATHOLOGY CASE: [id number] PATIENT: [facility] Report     Specimen Submitted: A. Colon polyp, asc; cold snare B. Rectum polyp; cold snare  Clinical History: Hx of colonic polyps Z86.010. Colon polyps      DIAGNOSIS: A.  COLON POLYP, ASCENDING; COLD SNARE: - TUBULAR ADENOMA. - NEGATIVE FOR HIGH-GRADE DYSPLASIA AND MALIGNANCY.  B.  RECTUM POLYP; COLD SNARE: - TUBULAR ADENOMA. - NEGATIVE FOR HIGH-GRADE DYSPLASIA AND MALIGNANCY.  GROSS DESCRIPTION: A. Labeled: Cold snare polyp ascending colon Received: Formalin Collection time: 10:38 AM on 07/03/2022 Placed into formalin time: 10:38 AM on 07/03/2022 Tissue fragment(s): 1 Size: 0.4 x 0.3 x 0.2 cm Description: Tan soft tissue fragment Entirely submitted in 1 cassette.  B. Labeled: Cold snare polyp rectum Received: Formalin Collection time: 10:44 AM on 07/03/2022 Placed into formalin time: 10:44 AM on 07/03/2022 Tissue fragment(s): 2 Size: Range from 0.3-0.5 cm Description: T an soft tissue fragments Entirely submitted in 1 cassette.  RB 07/03/2022  Final Diagnosis performed by Quay Burow, MD.   Electronically  signed 07/04/2022 9:45:49AM The electronic signature indicates that the named Attending Pathologist has evaluated the specimen Technical component performed at [facility], [address], [place], [place] [address] Lab: [phone] Dir: Rush Farmer, MD, MMM  Professional component performed at [facility], [facility], [place], [place], [place] [address] Lab: [phone] Dir: Kathi Simpers, MD       Assessment & Plan:   Problem List Items Addressed This Visit     Allergic dermatitis   Relevant  Medications   hydrOXYzine (ATARAX) 10 MG tablet   CAD (coronary artery disease)   Relevant Medications   amLODipine (NORVASC) 10 MG tablet   losartan (COZAAR) 100 MG tablet   Chronic anxiety - Primary   Relevant Medications   hydrOXYzine (ATARAX) 10 MG tablet   sertraline (ZOLOFT) 100 MG tablet   busPIRone (BUSPAR) 5 MG tablet   Chronic obstructive pulmonary disease (HCC)    Stable COPD without acute flare Former smoker, quit last year Failed Spiriva, limited result Will start Breztri 2 puff TWICE A DAY new rx sent  Rarely using PRN Albuterol Future Pulm ref      Relevant Medications   BREZTRI AEROSPHERE 160-9-4.8 MCG/ACT AERO   Essential hypertension    Controlled HTN on current regimen - Home BP readings stable Complication with Mild-Mod CAD / PAD (carotid) OFF Thiazide, BB Followed by Cardiology  Plan:  1. Continue current BP regimen Amlodipine '5mg'$  daily, Losartan '100mg'$ , Imdur '60mg'$  nightly - per chart review, patient to check bottles at home, she cannot recall med rec 2. Encourage improved lifestyle - low sodium diet, improve regular exercise 3. Continue monitor BP outside office, bring readings to next visit, if persistently >140/90 or new symptoms notify office sooner      Relevant Medications   amLODipine (NORVASC) 10 MG tablet   losartan (COZAAR) 100 MG tablet   Centrilobular Emphysema Stop Spiriva Start Breztri 2 puffs twice a  day  Anxiety Insomnia Discussion on underlying anxiety keeping her awake Failed Trazodone  Continue Sertraline '100mg'$  daily, no change SSRI yet, reconsider Lexapro option in future  Start the Buspar '5mg'$  up to 3 times a day max, can take 1 to 2 per dose, for anxiety, use only when needed and keep it on a scheduled regimen, it can help you rest and sleep.  Hydroxyzine can be taken 1-2 per dose at night if need  We can switch Sertraline to other version next time if not improved.   Meds ordered this encounter  Medications   amLODipine (NORVASC) 10 MG tablet    Sig: Take 1 tablet (10 mg total) by mouth daily.    Dispense:  90 tablet    Refill:  3    Add future refills 1 year   losartan (COZAAR) 100 MG tablet    Sig: Take 1 tablet (100 mg total) by mouth daily.    Dispense:  90 tablet    Refill:  3    Add refills   sertraline (ZOLOFT) 100 MG tablet    Sig: Take 1 tablet (100 mg total) by mouth daily.    Dispense:  90 tablet    Refill:  3    Add refills   busPIRone (BUSPAR) 5 MG tablet    Sig: Take 1-2 tablets (5-10 mg total) by mouth 3 (three) times daily as needed.    Dispense:  90 tablet    Refill:  3   BREZTRI AEROSPHERE 160-9-4.8 MCG/ACT AERO    Sig: Inhale 2 puffs into the lungs 2 (two) times daily.    Dispense:  10.7 g    Refill:  11      Follow up plan: Return in about 3 months (around 12/11/2022) for 3 month follow up anxiety, sleep, COPD, updates.   Nobie Putnam, [facility] 09/10/2022, 10:56 AM

## 2022-10-03 ENCOUNTER — Encounter: Payer: Self-pay | Admitting: Pharmacist

## 2022-10-03 NOTE — Progress Notes (Signed)
[facility])                                            [place] Team                                        Statin Quality Measure Assessment    10/03/2022  Kristin Parker 10/23/1951 [id number]  Per review of chart and payor information, patient has a diagnosis of cardiovascular disease but is not currently filling a statin prescription.  This places patient into the [facility] (Statin Use In Patients with Cardiovascular Disease) measure for CMS.    Patient has documented trials of atorvastatin, rosuvastatin, and simvastatin with reported myalgias, but no corresponding CPT codes that would exclude patient from [facility] measure.     Component Value Date/Time   CHOL 161 09/06/2021 0820   CHOL 135 07/22/2019 1034   TRIG 230 (H) 09/06/2021 0820   HDL 37 (L) 09/06/2021 0820   HDL 43 07/22/2019 1034   CHOLHDL 4.4 09/06/2021 0820   VLDL NOT CALCULATED 05/28/2017 1008   LDLCALC 92 09/06/2021 0820   LDLDIRECT 62 05/28/2017 1008     Please consider ONE of the following recommendations:  Initiate high intensity statin Atorvastatin '40mg'$  once daily, #90, 3 refills   Rosuvastatin '20mg'$  once daily, #90, 3 refills    Initiate moderate intensity  statin with reduced frequency if prior  statin intolerance 1x weekly, #13, 3 refills   2x weekly, #26, 3 refills   3x weekly, #39, 3 refills    Code for past statin intolerance  (required annually)  Provider Requirements: Must asociate code during an office visit or telehealth encounter   Drug Induced Myopathy G72.0   Myalgia M79.1   Myositis, unspecified M60.9   Myopathy, unspecified G72.9   Rhabdomyolysis M62.82     Loretha Brasil, PharmD [facility] Pharmacist Office: [phone]

## 2022-10-04 ENCOUNTER — Ambulatory Visit: Payer: Medicare HMO | Admitting: Internal Medicine

## 2022-10-04 NOTE — Progress Notes (Deleted)
Follow-up Outpatient Visit Date: 10/04/2022  Primary Care Provider: Olin Hauser, DO [address] [place] [address]  Chief Complaint: ***  HPI:  Kristin Parker is a 71 y.o. female with history of coronary artery disease (up to 40% distal LMCA by cath in 05/2017), carotid artery stenosis, hypertension, hyperlipidemia, depression, and anxiety, who presents for follow-up of coronary artery disease.  I last saw her in April, at which time she was most concerned about progressive abdominal bloating and fullness.  She reported stable exertional dyspnea without any chest pain.  We did not make any medication changes or pursue further heart testing.  We discussed sleep study for further evaluation of her TEE, though Ms. Bernath wished to defer.  She was subsequently seen by Dr. Vicente Males in the GI clinic, who felt that her abdominal distention was most likely due to a combination of stool burden and abdominal fat.  Two small polyps were noted on subsequent colonoscopy.  --------------------------------------------------------------------------------------------------  Cardiovascular History & Procedures: Cardiovascular Problems: Coronary artery disease Aortic sclerosis Carotid artery stenosis (mild to moderate)   Risk Factors: Cerebrovascular disease, hypertension, hyperlipidemia, tobacco use, and age greater than 39   Cath/PCI: LHC (06/25/17): Mild to moderate CAD, including 40% distal LMCA, 20% ostial LAD, 20-30% mid LAD, and sequential 30% proximal and mid LCx stenoses.  Small, nondominant RCA without significant disease.  Normal left ventricular filling pressure.   CV Surgery: None   EP Procedures and Devices: None   Non-Invasive Evaluation(s): TTE (10/12/2020): Normal LV size with mild LVH.  LVEF 55-60% with grade 1 diastolic dysfunction.  GLS -15.2%.  Normal RV size and function.  Aortic sclerosis with borderline mild stenosis noted (mean gradient 10 mmHg). Coronary CTA (09/29/2020):  Mild-moderate, nonobstructive disease including 25% stenosis in the proximal LMCA, less than 25% stenosis in the proximal LAD, and 25-50% stenosis in the proximal LCx (dominant vessel).  Calcium score 192 (79th percentile).  Significant atheroma noted in the descending aorta. ETT (06/18/17): Abnormal exercise tolerance test, the target heart rate not achieved.  1-2 mm horizontal ST depressions noted in inferior leads.  Patient only achieved 84% MPHR due to shortness of breath and fatigue. Carotid artery Duplex (03/01/17): Smooth plaque, bilaterally. 40-59% RICA stenosis. 6-04% LICA stenosis. Patent vertebral arteries with antegrade flow. Normal subclavian arteries, bilaterally. TTE (03/01/17): Normal LV size and function with LVEF 60-65% and grade 1 diastolic dysfunction. Mild MR. Normal RV size and function. Normal PA pressure.  Recent CV Pertinent Labs: Lab Results  Component Value Date   CHOL 161 09/06/2021   CHOL 135 07/22/2019   HDL 37 (L) 09/06/2021   HDL 43 07/22/2019   LDLCALC 92 09/06/2021   LDLDIRECT 62 05/28/2017   TRIG 230 (H) 09/06/2021   CHOLHDL 4.4 09/06/2021   INR 0.94 06/24/2017   BNP 53.0 01/27/2017   K 3.7 09/06/2021   MG 1.8 02/18/2021   BUN 12 09/06/2021   BUN 11 07/22/2019   CREATININE 0.82 09/06/2021    Past medical and surgical history were reviewed and updated in EPIC.  No outpatient medications have been marked as taking for the 10/04/22 encounter (Appointment) with Drelyn Pistilli, Harrell Gave, MD.    Allergies: Bactrim [sulfamethoxazole-trimethoprim], Aleve [naproxen sodium], Atorvastatin, Crestor [rosuvastatin], Simvastatin, and Zetia [ezetimibe]  Social History   Tobacco Use   Smoking status: Former    Packs/day: 0.50    Years: 30.00    Total pack years: 15.00    Types: Cigarettes    Quit date: 10/30/2020  Years since quitting: 1.9   Smokeless tobacco: Never   Tobacco comments:    Quit Jan 2022  Vaping Use   Vaping Use: Former  Substance Use Topics    Alcohol use: No    Alcohol/week: 0.0 standard drinks of alcohol   Drug use: No    Family History  Problem Relation Age of Onset   Cancer Mother        bladder cancer   Heart disease Mother 24       Pacemaker   Cancer Father        lung   Multiple sclerosis Sister    Valvular heart disease Brother 63       s/p bioprosthetic valve replacement at [facility]   Breast cancer Neg Hx     Review of Systems: A 12-system review of systems was performed and was negative except as noted in the HPI.  --------------------------------------------------------------------------------------------------  Physical Exam: There were no vitals taken for this visit.  General:  NAD. Neck: No JVD or HJR. Lungs: Clear to auscultation bilaterally without wheezes or crackles. Heart: Regular rate and rhythm without murmurs, rubs, or gallops. Abdomen: Soft, nontender, nondistended. Extremities: No lower extremity edema.  EKG:  ***  Lab Results  Component Value Date   WBC 7.8 09/06/2021   HGB 14.9 09/06/2021   HCT 44.0 09/06/2021   MCV 89.4 09/06/2021   PLT 339 09/06/2021    Lab Results  Component Value Date   NA 144 09/06/2021   K 3.7 09/06/2021   CL 105 09/06/2021   CO2 26 09/06/2021   BUN 12 09/06/2021   CREATININE 0.82 09/06/2021   GLUCOSE 101 (H) 09/06/2021   ALT 14 09/06/2021    Lab Results  Component Value Date   CHOL 161 09/06/2021   HDL 37 (L) 09/06/2021   LDLCALC 92 09/06/2021   LDLDIRECT 62 05/28/2017   TRIG 230 (H) 09/06/2021   CHOLHDL 4.4 09/06/2021    --------------------------------------------------------------------------------------------------  ASSESSMENT AND PLAN: Harrell Gave Hailei Besser, MD 10/04/2022 6:30 AM

## 2022-10-16 ENCOUNTER — Telehealth: Payer: Self-pay | Admitting: Internal Medicine

## 2022-10-16 NOTE — Telephone Encounter (Signed)
Attempted PA via [place] already on file for this request. Authorization starting on 10/29/2020 and ending on 10/29/2023.

## 2022-10-16 NOTE — Progress Notes (Unsigned)
Follow-up Outpatient Visit Date: 10/17/2022  Primary Care Provider: Olin Hauser, DO [address] [place] [address]  Chief Complaint: Abdominal bloating and heartburn  HPI:  Kristin Parker is a 71 y.o. female with history of coronary artery disease (up to 40% distal LMCA by cath in 05/2017), carotid artery stenosis, hypertension, hyperlipidemia, depression, and anxiety, who presents for follow-up of coronary artery disease.  I last saw her in April, at which time she was most concerned about abdominal fullness and bloating.  Chronic exertional dyspnea was stable.  She reported some chest pain when lying down but not with exertion.  We did not make any medication changes or pursue additional testing.  I encouraged her to speak with her PCP and/your gastroenterologist for further workup of her abdominal symptoms.  Today, Kristin Parker is most concerned about heartburn as well as continued abdominal bloating.  Heartburn happens almost every time she eats and resolves promptly with a gas relief medicine.  She has not tried an antacid.  She does not have any exertional chest pain.  Her chronic shortness of breath has improved since switching to [place].  She denies edema.  She has rare palpitations and only sporadic transient orthostatic lightheadedness.  Home blood pressure is typically well-controlled.  --------------------------------------------------------------------------------------------------  Cardiovascular History & Procedures: Cardiovascular Problems: Coronary artery disease Aortic sclerosis Carotid artery stenosis (mild to moderate)   Risk Factors: Cerebrovascular disease, hypertension, hyperlipidemia, tobacco use, and age greater than 82   Cath/PCI: LHC (06/25/17): Mild to moderate CAD, including 40% distal LMCA, 20% ostial LAD, 20-30% mid LAD, and sequential 30% proximal and mid LCx stenoses.  Small, nondominant RCA without significant disease.  Normal left ventricular  filling pressure.   CV Surgery: None   EP Procedures and Devices: None   Non-Invasive Evaluation(s): TTE (10/12/2020): Normal LV size with mild LVH.  LVEF 55-60% with grade 1 diastolic dysfunction.  GLS -15.2%.  Normal RV size and function.  Aortic sclerosis with borderline mild stenosis noted (mean gradient 10 mmHg). Coronary CTA (09/29/2020): Mild-moderate, nonobstructive disease including 25% stenosis in the proximal LMCA, less than 25% stenosis in the proximal LAD, and 25-50% stenosis in the proximal LCx (dominant vessel).  Calcium score 192 (79th percentile).  Significant atheroma noted in the descending aorta. ETT (06/18/17): Abnormal exercise tolerance test, the target heart rate not achieved.  1-2 mm horizontal ST depressions noted in inferior leads.  Patient only achieved 84% MPHR due to shortness of breath and fatigue. Carotid artery Duplex (03/01/17): Smooth plaque, bilaterally. 40-59% RICA stenosis. 7-74% LICA stenosis. Patent vertebral arteries with antegrade flow. Normal subclavian arteries, bilaterally. TTE (03/01/17): Normal LV size and function with LVEF 60-65% and grade 1 diastolic dysfunction. Mild MR. Normal RV size and function. Normal PA pressure.  Recent CV Pertinent Labs: Lab Results  Component Value Date   CHOL 161 09/06/2021   CHOL 135 07/22/2019   HDL 37 (L) 09/06/2021   HDL 43 07/22/2019   LDLCALC 92 09/06/2021   LDLDIRECT 62 05/28/2017   TRIG 230 (H) 09/06/2021   CHOLHDL 4.4 09/06/2021   INR 0.94 06/24/2017   BNP 53.0 01/27/2017   K 3.7 09/06/2021   MG 1.8 02/18/2021   BUN 12 09/06/2021   BUN 11 07/22/2019   CREATININE 0.82 09/06/2021    Past medical and surgical history were reviewed and updated in EPIC.  Current Meds  Medication Sig   acetaminophen (TYLENOL) 500 MG tablet Take 2 tablets (1,000 mg total) by mouth every 6 (six)  hours as needed for mild pain.   albuterol (VENTOLIN HFA) 108 (90 Base) MCG/ACT inhaler INHALE 2 PUFFS EVERY 4 HOURS FOR  WHEEZING OR SHORTNESS OF BREATH AS NEEDED   amLODipine (NORVASC) 10 MG tablet Take 1 tablet (10 mg total) by mouth daily.   aspirin EC 81 MG tablet Take 1 tablet (81 mg total) by mouth daily.   BREZTRI AEROSPHERE 160-9-4.8 MCG/ACT AERO Inhale 2 puffs into the lungs 2 (two) times daily.   busPIRone (BUSPAR) 5 MG tablet Take 1-2 tablets (5-10 mg total) by mouth 3 (three) times daily as needed.   fluticasone (FLONASE) 50 MCG/ACT nasal spray USE 2 SPRAYS INTO EACH NOSTRIL ONCE DAILY   hydrOXYzine (ATARAX) 10 MG tablet Take 1-2 tablets (10-20 mg total) by mouth at bedtime as needed.   isosorbide mononitrate (IMDUR) 60 MG 24 hr tablet TAKE 1 TABLET BY MOUTH ONCE DAILY   losartan (COZAAR) 100 MG tablet Take 1 tablet (100 mg total) by mouth daily.   meloxicam (MOBIC) 15 MG tablet TAKE 1 TABLET BY MOUTH ONCE DAILY AS NEEDED FOR PAIN WITH FOOD   REPATHA SURECLICK 443 MG/ML SOAJ INJECT 1 DOSE INTO THE SKIN EVERY 14 DAYS   sertraline (ZOLOFT) 100 MG tablet Take 1 tablet (100 mg total) by mouth daily.    Allergies: Bactrim [sulfamethoxazole-trimethoprim], Aleve [naproxen sodium], Atorvastatin, Crestor [rosuvastatin], Simvastatin, and Zetia [ezetimibe]  Social History   Tobacco Use   Smoking status: Former    Packs/day: 0.50    Years: 30.00    Total pack years: 15.00    Types: Cigarettes    Quit date: 10/30/2020    Years since quitting: 1.9   Smokeless tobacco: Never   Tobacco comments:    Quit Jan 2022  Vaping Use   Vaping Use: Former  Substance Use Topics   Alcohol use: No    Alcohol/week: 0.0 standard drinks of alcohol   Drug use: No    Family History  Problem Relation Age of Onset   Cancer Mother        bladder cancer   Heart disease Mother 37       Pacemaker   Cancer Father        lung   Multiple sclerosis Sister    Valvular heart disease Brother 34       s/p bioprosthetic valve replacement at [facility]   Breast cancer Neg Hx     Review of Systems: A 12-system review of systems was  performed and was negative except as noted in the HPI.  --------------------------------------------------------------------------------------------------  Physical Exam: BP 120/66 (BP Location: Left Arm, Patient Position: Sitting, Cuff Size: Normal)   Pulse 60   Ht '5\' 7"'$  (1.702 m)   Wt 174 lb (78.9 kg)   SpO2 95%   BMI 27.25 kg/m   General:  NAD. Neck: No JVD or HJR. Lungs: Clear to auscultation bilaterally without wheezes or crackles. Heart: Regular rate and rhythm with 2/6 systolic murmur. Abdomen: Soft, nontender, nondistended. Extremities: No lower extremity edema.  EKG:  Normal sinus rhythm with nonspecific ST changes.  No significant change from prior tracing on 02/07/2022.  Lab Results  Component Value Date   WBC 7.8 09/06/2021   HGB 14.9 09/06/2021   HCT 44.0 09/06/2021   MCV 89.4 09/06/2021   PLT 339 09/06/2021    Lab Results  Component Value Date   NA 144 09/06/2021   K 3.7 09/06/2021   CL 105 09/06/2021   CO2 26 09/06/2021   BUN 12 09/06/2021  CREATININE 0.82 09/06/2021   GLUCOSE 101 (H) 09/06/2021   ALT 14 09/06/2021    Lab Results  Component Value Date   CHOL 161 09/06/2021   HDL 37 (L) 09/06/2021   LDLCALC 92 09/06/2021   LDLDIRECT 62 05/28/2017   TRIG 230 (H) 09/06/2021   CHOLHDL 4.4 09/06/2021    --------------------------------------------------------------------------------------------------  ASSESSMENT AND PLAN: Coronary artery disease with stable angina: Kristin Parker does not report any frank angina.  Her heartburn symptoms are most consistent with GI pathology.  I have recommended that she try famotidine 20 mg twice daily.  I have also recommended that she follow-up with her gastroenterologist for further evaluation of her GERD symptoms as well as her chronic abdominal bloating.  Continue aspirin and Repatha for secondary prevention as well as antianginal therapy with amlodipine and isosorbide mononitrate.  Check CBC, CMP, and lipid panel  today.  COPD: Symptoms improved since switching to [place].  Ongoing management per Dr. Parks Ranger.  Hyperlipidemia: Check CMP and lipid panel today.  Plan to continue Repatha for target LDL less than 70 given history of nonobstructive CAD and mild bilateral carotid artery stenoses.  Hypertension: Blood pressure well-controlled today.  No medication changes at this time.  Follow-up: Return to clinic in 1 year.  [name], MD 10/17/2022 9:20 AM

## 2022-10-17 ENCOUNTER — Encounter: Payer: Self-pay | Admitting: Internal Medicine

## 2022-10-17 ENCOUNTER — Ambulatory Visit: Payer: Medicare HMO | Attending: Internal Medicine | Admitting: Internal Medicine

## 2022-10-17 ENCOUNTER — Other Ambulatory Visit
Admission: RE | Admit: 2022-10-17 | Discharge: 2022-10-17 | Disposition: A | Discharge status: Home / Self Care | Payer: Medicare HMO | Source: Ambulatory Visit | Attending: Internal Medicine | Admitting: Internal Medicine

## 2022-10-17 VITALS — BP 120/66 | HR 60 | Ht 67.0 in | Wt 174.0 lb

## 2022-10-17 DIAGNOSIS — Z79899 Other long term (current) drug therapy: Secondary | ICD-10-CM | POA: Insufficient documentation

## 2022-10-17 DIAGNOSIS — I10 Essential (primary) hypertension: Secondary | ICD-10-CM

## 2022-10-17 DIAGNOSIS — J449 Chronic obstructive pulmonary disease, unspecified: Secondary | ICD-10-CM | POA: Diagnosis not present

## 2022-10-17 DIAGNOSIS — I25118 Atherosclerotic heart disease of native coronary artery with other forms of angina pectoris: Secondary | ICD-10-CM | POA: Diagnosis not present

## 2022-10-17 DIAGNOSIS — E785 Hyperlipidemia, unspecified: Secondary | ICD-10-CM | POA: Diagnosis not present

## 2022-10-17 LAB — COMPREHENSIVE METABOLIC PANEL
ALT: 23 U/L (ref 0–44)
AST: 21 U/L (ref 15–41)
Albumin: 3.8 g/dL (ref 3.5–5.0)
Alkaline Phosphatase: 101 U/L (ref 38–126)
Anion gap: 8 (ref 5–15)
BUN: 14 mg/dL (ref 8–23)
CO2: 26 mmol/L (ref 22–32)
Calcium: 9.3 mg/dL (ref 8.9–10.3)
Chloride: 106 mmol/L (ref 98–111)
Creatinine, Ser: 1.01 mg/dL — ABNORMAL HIGH (ref 0.44–1.00)
GFR, Estimated: 59 mL/min — ABNORMAL LOW (ref >60)
Glucose, Bld: 110 mg/dL — ABNORMAL HIGH (ref 70–99)
Potassium: 3.9 mmol/L (ref 3.5–5.1)
Sodium: 140 mmol/L (ref 135–145)
Total Bilirubin: 0.6 mg/dL (ref 0.3–1.2)
Total Protein: 7.3 g/dL (ref 6.5–8.1)

## 2022-10-17 LAB — CBC
HCT: 42.9 % (ref 36.0–46.0)
Hemoglobin: 14.2 g/dL (ref 12.0–15.0)
MCH: 30.1 pg (ref 26.0–34.0)
MCHC: 33.1 g/dL (ref 30.0–36.0)
MCV: 91.1 fL (ref 80.0–100.0)
Platelets: 279 10*3/uL (ref 150–400)
RBC: 4.71 MIL/uL (ref 3.87–5.11)
RDW: 12.8 % (ref 11.5–15.5)
WBC: 9.1 10*3/uL (ref 4.0–10.5)
nRBC: 0 % (ref 0.0–0.2)

## 2022-10-17 LAB — LIPID PANEL
Cholesterol: 192 mg/dL (ref 0–200)
HDL: 38 mg/dL — ABNORMAL LOW
LDL Cholesterol: 92 mg/dL (ref 0–99)
Total CHOL/HDL Ratio: 5.1 ratio
Triglycerides: 308 mg/dL — ABNORMAL HIGH
VLDL: 62 mg/dL — ABNORMAL HIGH (ref 0–40)

## 2022-10-17 MED ORDER — FAMOTIDINE 20 MG PO TABS: 20.0000 mg | ORAL_TABLET | Freq: Two times a day (BID) | ORAL | 3 refills | Status: DC

## 2022-10-17 NOTE — Patient Instructions (Addendum)
.    Medication Instructions:  Your physician recommends the following medication changes.  START TAKING: Famotidine 20 mg by mouth twice a day   *If you need a refill on your cardiac medications before your next appointment, please call your pharmacy*   Lab Work: Your provider would like for you to have following labs drawn: (CBC, CMP, Lipid).   Please go to the [facility] entrance and check in at the front desk.  You do not need an appointment.  They are open from 7am-6 pm.    If you have labs (blood work) drawn today and your tests are completely normal, you will receive your results only by: [place] (if you have MyChart) OR A paper copy in the mail  If you have any lab test that is abnormal or we need to change your treatment, we will call you to review the results.   Testing/Procedures: None ordered today   Follow-Up: At [facility], you and your health needs are our priority.  As part of our continuing mission to provide you with exceptional heart care, we have created designated Provider Care Teams.  These Care Teams include your primary Cardiologist (physician) and Advanced Practice Providers (APPs -  Physician Assistants and Nurse Practitioners) who all work together to provide you with the care you need, when you need it.  We recommend signing up for the patient portal called "MyChart".  Sign up information is provided on this After Visit Summary.  MyChart is used to connect with patients for Virtual Visits (Telemedicine).  Patients are able to view lab/test results, encounter notes, upcoming appointments, etc.  Non-urgent messages can be sent to your provider as well.   To learn more about what you can do with MyChart, go to [email or web address].    Your next appointment:   1 year(s)  The format for your next appointment:   In Person  Provider:   You may see [name], MD or one of the following Advanced Practice Providers on your  designated Care Team:   Murray Hodgkins, NP Christell Faith, PA-C Cadence Kathlen Mody, PA-C Gerrie Nordmann, NP

## 2022-10-19 ENCOUNTER — Other Ambulatory Visit: Payer: Self-pay | Admitting: Family Medicine

## 2022-10-19 DIAGNOSIS — F419 Anxiety disorder, unspecified: Secondary | ICD-10-CM

## 2022-10-19 DIAGNOSIS — J432 Centrilobular emphysema: Secondary | ICD-10-CM

## 2022-10-23 NOTE — Telephone Encounter (Signed)
Requested Prescriptions  Pending Prescriptions Disp Refills   busPIRone (BUSPAR) 5 MG tablet [Pharmacy Med Name: BUSPIRONE HCL 5 MG TAB] 90 tablet 3    Sig: TAKE 1-2 TABLETS BY MOUTH 3 TIMES DAILY AS NEEDED     Psychiatry: Anxiolytics/Hypnotics - Non-controlled Passed - 10/19/2022  5:32 PM      Passed - Valid encounter within last 12 months    Recent Outpatient Visits           1 month ago Chronic anxiety   [place], DO   9 months ago COPD with acute exacerbation [facility])   [facility] Olin Hauser, DO   1 year ago Osteoarthritis of glenohumeral joint, right   [place], DO   1 year ago Annual physical exam   [facility] Olin Hauser, DO   1 year ago Elevated hemoglobin A1c   [place], DO       Future Appointments             In 1 month Parks Ranger, Devonne Doughty, DO [facility]             albuterol (VENTOLIN HFA) 108 (90 Base) MCG/ACT inhaler [Pharmacy Med Name: ALBUTEROL SULFATE HFA 108 (90 BASE)] 18 g 3    Sig: INHALE 2 PUFFS EVERY 4 HOURS FOR WHEEZING OR SHORTNESS OF BREATH AS NEEDED     Pulmonology:  Beta Agonists 2 Passed - 10/19/2022  5:32 PM      Passed - Last BP in normal range    BP Readings from Last 1 Encounters:  10/17/22 120/66         Passed - Last Heart Rate in normal range    Pulse Readings from Last 1 Encounters:  10/17/22 60         Passed - Valid encounter within last 12 months    Recent Outpatient Visits           1 month ago Chronic anxiety   [place], DO   9 months ago COPD with acute exacerbation [facility])   [place], DO   1 year ago Osteoarthritis of glenohumeral joint, right   [place], DO   1 year ago  Annual physical exam   [place], DO   1 year ago Elevated hemoglobin A1c   [place], DO       Future Appointments             In 1 month Parks Ranger, Devonne Doughty, [facility], [facility]

## 2022-11-01 ENCOUNTER — Other Ambulatory Visit: Payer: Self-pay

## 2022-11-01 ENCOUNTER — Telehealth: Payer: Self-pay

## 2022-11-01 DIAGNOSIS — Z79899 Other long term (current) drug therapy: Secondary | ICD-10-CM

## 2022-11-01 MED ORDER — ICOSAPENT ETHYL 1 G PO CAPS: 2.0000 g | ORAL_CAPSULE | Freq: Two times a day (BID) | ORAL | 3 refills | Status: DC

## 2022-11-01 NOTE — Telephone Encounter (Signed)
Prior authorization initiated via CoverMyMeds for Icosapent Ethyl 1GM capsules. Key: [id number]   Response: Your information has been submitted to [facility]. [employer] will review the request and will issue a decision, typically within 3-7 days from your submission.

## 2022-11-02 ENCOUNTER — Telehealth: Payer: Self-pay | Admitting: Internal Medicine

## 2022-11-02 NOTE — Telephone Encounter (Signed)
Received a fax from [facility] requesting a response in regards to changing the requested medication to [facility]. Dr. Saunders Revel agrees to change the medication from the generic drug Icosapent Ethyl to the brand drug Vascepa which is on the drug formulary in regards to the prior authorization. Obtained Dr. Darnelle Bos signature and faxed the form back to [facility] at fax number [phone]. Successful fax confirmation received.

## 2022-11-02 NOTE — Telephone Encounter (Signed)
Contacted pt's insurance company. They report medications was dispensed yesterday 1/4 with no additional prior auth required.

## 2022-11-02 NOTE — Telephone Encounter (Signed)
Your provider has decided to change your prescription to one of [employer];s covered alternatives. The originally requested drug is not on [employer] preferred drug list (formulary). Approval of the originally requested drug (called a formulary exception) requires that you first try some of the preferred drugs on your [facility] drug list (formulary), or tell us if they were/are not right for you. The drugs on [employer] drug list include: Vascepa capsule. Your providers statement didnt say the drugs on [employer] drug list: wont or havent worked, or that they would cause a bad drug reaction or incorrect usage. Your original request doesnt meet the coverage criteria for a formulary exception, but your provider has decided to change your prescription to one of [employer]s covered alternatives. Some preferred drugs may require an additional review and approval by [facility]. Additionally, some alternative drugs listed may be the same drugs with different strengths or forms. Under certain cases, [employer] may only require trial and failure of one strength or form of that drug. Please discuss this letter, and the new drug, with your provider. This determination was based on the [employer] Non-Formulary Exceptions Coverage Policy.

## 2022-11-02 NOTE — Telephone Encounter (Signed)
Pt c/o medication issue:  1. Name of Medication: icosapent Ethyl (VASCEPA) 1 g capsule   2. How are you currently taking this medication (dosage and times per day)? N/A  3. Are you having a reaction (difficulty breathing--STAT)? No   4. What is your medication issue? [employer] is wanting to know if Dr. Saunders Revel would like to change this to Vascepa. Ref # for callback is [id number].

## 2022-11-27 ENCOUNTER — Other Ambulatory Visit: Payer: Self-pay | Admitting: Family Medicine

## 2022-11-27 ENCOUNTER — Other Ambulatory Visit: Payer: Self-pay | Admitting: Internal Medicine

## 2022-11-27 DIAGNOSIS — J3089 Other allergic rhinitis: Secondary | ICD-10-CM

## 2022-11-28 NOTE — Telephone Encounter (Signed)
Requested Prescriptions  Pending Prescriptions Disp Refills   fluticasone (FLONASE) 50 MCG/ACT nasal spray [Pharmacy Med Name: FLUTICASONE PROPIONATE 50 MCG/ACT N] 16 g 0    Sig: USE 2 SPRAYS INTO EACH NOSTRIL ONCE DAILY     Ear, Nose, and Throat: Nasal Preparations - Corticosteroids Passed - 11/27/2022  5:40 PM      Passed - Valid encounter within last 12 months    Recent Outpatient Visits           2 months ago Chronic anxiety   [place], DO   10 months ago COPD with acute exacerbation [facility])   [place], DO   1 year ago Osteoarthritis of glenohumeral joint, right   [facility] Olin Hauser, DO   1 year ago Annual physical exam   [facility] Olin Hauser, DO   1 year ago Elevated hemoglobin A1c   [facility] Olin Hauser, DO       Future Appointments             In 1 week Parks Ranger, Devonne Doughty, [facility], [facility]

## 2022-12-10 ENCOUNTER — Encounter: Payer: Self-pay | Admitting: Pharmacist

## 2022-12-10 NOTE — Progress Notes (Signed)
[facility])                                            [name] Team                                        Statin Quality Measure Assessment    12/10/2022  Kristin Parker Nov 27, 1950 [id number]   Per review of chart and payor information, patient has a diagnosis of cardiovascular disease but is not currently filling a statin prescription.  This places patient into the [facility] (Statin Use In Patients with Cardiovascular Disease) measure for CMS.    Statin intolerances noted as atorvastatin (myalgias), rosuvastatin (myalgias), simvastatin (myalgias). Patient is treated with PCSK9i.      Component Value Date/Time   CHOL 192 10/17/2022 0943   CHOL 135 07/22/2019 1034   TRIG 308 (H) 10/17/2022 0943   HDL 38 (L) 10/17/2022 0943   HDL 43 07/22/2019 1034   CHOLHDL 5.1 10/17/2022 0943   VLDL 62 (H) 10/17/2022 0943   LDLCALC 92 10/17/2022 0943   LDLCALC 92 09/06/2021 0820   LDLDIRECT 62 05/28/2017 1008     Please consider ONE of the following recommendations:  Initiate high intensity statin Atorvastatin 9m once daily, #90, 3 refills   Rosuvastatin 244monce daily, #90, 3 refills    Initiate moderate intensity  statin with reduced frequency if prior  statin intolerance 1x weekly, #13, 3 refills   2x weekly, #26, 3 refills   3x weekly, #39, 3 refills    Code for past statin intolerance  (required annually)  Provider Requirements: Must asociate code during an office visit or telehealth encounter   Drug Induced Myopathy G72.0   Myalgia M79.1   Myositis, unspecified M60.9   Myopathy, unspecified G72.9   Rhabdomyolysis M62.82     TiLoretha BrasilPharmD CoDeshaharmacist Direct Dial: [phone]

## 2022-12-11 ENCOUNTER — Ambulatory Visit (INDEPENDENT_AMBULATORY_CARE_PROVIDER_SITE_OTHER): Payer: Medicare HMO | Admitting: Family Medicine

## 2022-12-11 ENCOUNTER — Encounter: Payer: Self-pay | Admitting: Family Medicine

## 2022-12-11 VITALS — BP 120/68 | HR 63 | Ht 67.0 in | Wt 175.0 lb

## 2022-12-11 DIAGNOSIS — H60541 Acute eczematoid otitis externa, right ear: Secondary | ICD-10-CM

## 2022-12-11 DIAGNOSIS — I10 Essential (primary) hypertension: Secondary | ICD-10-CM | POA: Diagnosis not present

## 2022-12-11 DIAGNOSIS — R7309 Other abnormal glucose: Secondary | ICD-10-CM

## 2022-12-11 DIAGNOSIS — J432 Centrilobular emphysema: Secondary | ICD-10-CM | POA: Diagnosis not present

## 2022-12-11 DIAGNOSIS — I25118 Atherosclerotic heart disease of native coronary artery with other forms of angina pectoris: Secondary | ICD-10-CM

## 2022-12-11 DIAGNOSIS — L299 Pruritus, unspecified: Secondary | ICD-10-CM

## 2022-12-11 DIAGNOSIS — F419 Anxiety disorder, unspecified: Secondary | ICD-10-CM

## 2022-12-11 DIAGNOSIS — G720 Drug-induced myopathy: Secondary | ICD-10-CM | POA: Diagnosis not present

## 2022-12-11 DIAGNOSIS — E782 Mixed hyperlipidemia: Secondary | ICD-10-CM

## 2022-12-11 MED ORDER — HYDROXYZINE PAMOATE 25 MG PO CAPS: 25.0000 mg | ORAL_CAPSULE | Freq: Three times a day (TID) | ORAL | 3 refills | Status: DC | PRN

## 2022-12-11 MED ORDER — HYDROCORTISONE-ACETIC ACID 1-2 % OT SOLN: 3.0000 [drp] | Freq: Three times a day (TID) | OTIC | 0 refills | Status: DC

## 2022-12-11 NOTE — Patient Instructions (Addendum)
Thank you for coming to the office today.  Okay to take Buspar 48m x 2 in AM = 140m and take 1 in the PM if needed. Max is 3 per day.  Okay to take Hydroxyzine 2546mp to 3 times a day as needed, for itching, this is dose increased  [facility] the [facility].  Referral to [facility] [id number]: [phone] placard.   Please schedule a Follow-up Appointment to: Return in about 4 months (around 04/11/2023) for 4 month follow-up after Pulm, updates anxiety, cardiology.  If you have any other questions or concerns, please feel free to call the office or send a message through [place] may also schedule an earlier appointment if necessary.  Additionally, you may be receiving a survey about your experience at our office within a few days to 1 week by e-mail or mail. We value your feedback.  AleNobie PutnamO Sou[place]

## 2022-12-11 NOTE — Progress Notes (Signed)
Subjective:    Patient ID: Kristin Parker, female    DOB: 1951/01/02, 72 y.o.   MRN: [id number]  Kristin Parker is a 73 y.o. female presenting on 12/11/2022 for Anxiety, COPD, and Insomnia   HPI  Last visit with Cardiology Dr End 09/2022, had labs showed TG 308. LDL 92. She is on Repatha injections, and they added Vascepa for the Triglyceride.  Elevated A1c Prior A1c 5.4 to 5.7 Now recent A1c to 5.6   Meds: Never on med Currently on [facility] Lifestyle: - Diet (admits eats cookies and some sweets, not following low carb diet) - Exercise (limited) Denies hypoglycemia   CHRONIC HTN: Reports no concerns Current Meds - Losartan 128m daily, Amlodipine 123mdaily Reports good compliance, took meds today. Tolerating well, w/o complaints. Denies CP, dyspnea, HA, edema, dizziness / lightheadedness   Hyperlipidemia Followed by [facility] multiple meds in past including statins intermittent statin, rosuvastatin, recently zetia. Had myalgia. History of myalgia on statin, with drug induced myopathy could not tolerate She is doing great on Repatha SureClick, now LDL down from >200 to 160 to 92     COPD / Tobacco Abuse Former smoker now   Centrilobular Emphysema On Breztri but question efficacy if difficulty with actual inhaler, not getting the medicine to work properly Interested in further eval by pulmonology   Anxiety / Insomnia Continues on Sertraline 10061maily with good results. - needs refill Takes Hydroxyzine 9m57m NIGHT AS NEEDED to help with itching and then later in evening and night mind keeps going. anxiety keeps her awake, asking about other med Failed Trazodone.  Last visit 09/10/22 started on Buspar for anxiety. Taking 5mg 47m in AM 9mg 28my in morning, skip dose in afternoon, and may take one in evening or skip. - Has Hydroxyzine AS NEEDED but not taking regularly since not as effective. Taking it 1 in AM, 2 in PM, not every day. Itching is  less if active. All over. - Dermatology not able to solve the itching  Ears Itching        01/05/2022   10:29 AM 09/11/2021    2:19 PM 03/22/2021    9:34 AM  Depression screen PHQ 2/9  Decreased Interest 0 0 0  Down, Depressed, Hopeless 0 0 0  PHQ - 2 Score 0 0 0  Altered sleeping  1   Tired, decreased energy  0   Change in appetite  0   Feeling bad or failure about yourself   0   Trouble concentrating  0   Moving slowly or fidgety/restless  0   Suicidal thoughts  0   PHQ-9 Score  1   Difficult doing work/chores  Somewhat difficult     Social History   Tobacco Use   Smoking status: Former    Packs/day: 0.50    Years: 30.00    Total pack years: 15.00    Types: Cigarettes    Quit date: 10/30/2020    Years since quitting: 2.1   Smokeless tobacco: Never   Tobacco comments:    Quit Jan 2022  Vaping Use   Vaping Use: Former  Substance Use Topics   Alcohol use: No    Alcohol/week: 0.0 standard drinks of alcohol   Drug use: No    Review of Systems Per HPI unless specifically indicated above     Objective:    BP 120/68   Pulse 63   Ht 5' 7"$  (1.702 m)   Wt  175 lb (79.4 kg)   SpO2 95%   BMI 27.41 kg/m   Wt Readings from Last 3 Encounters:  12/11/22 175 lb (79.4 kg)  10/17/22 174 lb (78.9 kg)  09/10/22 169 lb 8 oz (76.9 kg)    Physical Exam Vitals and nursing note reviewed.  Constitutional:      General: She is not in acute distress.    Appearance: Normal appearance. She is well-developed. She is not diaphoretic.     Comments: Well-appearing, comfortable, cooperative  HENT:     Head: Normocephalic and atraumatic.     Ears:     Comments: R ext ear canal some flaking skin Eyes:     General:        Right eye: No discharge.        Left eye: No discharge.     Conjunctiva/sclera: Conjunctivae normal.  Cardiovascular:     Rate and Rhythm: Normal rate.  Pulmonary:     Effort: Pulmonary effort is normal. No respiratory distress.     Breath sounds: Normal  breath sounds. No wheezing, rhonchi or rales.     Comments: Some mild reduced air movement at baseline Skin:    General: Skin is warm and dry.     Findings: No erythema or rash.  Neurological:     Mental Status: She is alert and oriented to person, place, and time.  Psychiatric:        Mood and Affect: Mood normal.        Behavior: Behavior normal.        Thought Content: Thought content normal.     Comments: Well groomed, good eye contact, normal speech and thoughts       Results for orders placed or performed during the hospital encounter of 10/17/22  Lipid panel  Result Value Ref Range   Cholesterol 192 0 - 200 mg/dL   Triglycerides 308 (H) <150 mg/dL   HDL 38 (L) >40 mg/dL   Total CHOL/HDL Ratio 5.1 RATIO   VLDL 62 (H) 0 - 40 mg/dL   LDL Cholesterol 92 0 - 99 mg/dL  Comprehensive metabolic panel  Result Value Ref Range   Sodium 140 135 - 145 mmol/L   Potassium 3.9 3.5 - 5.1 mmol/L   Chloride 106 98 - 111 mmol/L   CO2 26 22 - 32 mmol/L   Glucose, Bld 110 (H) 70 - 99 mg/dL   BUN 14 8 - 23 mg/dL   Creatinine, Ser 1.01 (H) 0.44 - 1.00 mg/dL   Calcium 9.3 8.9 - 10.3 mg/dL   Total Protein 7.3 6.5 - 8.1 g/dL   Albumin 3.8 3.5 - 5.0 g/dL   AST 21 15 - 41 U/L   ALT 23 0 - 44 U/L   Alkaline Phosphatase 101 38 - 126 U/L   Total Bilirubin 0.6 0.3 - 1.2 mg/dL   GFR, Estimated 59 (L) >60 mL/min   Anion gap 8 5 - 15  CBC  Result Value Ref Range   WBC 9.1 4.0 - 10.5 K/uL   RBC 4.71 3.87 - 5.11 MIL/uL   Hemoglobin 14.2 12.0 - 15.0 g/dL   HCT 42.9 36.0 - 46.0 %   MCV 91.1 80.0 - 100.0 fL   MCH 30.1 26.0 - 34.0 pg   MCHC 33.1 30.0 - 36.0 g/dL   RDW 12.8 11.5 - 15.5 %   Platelets 279 150 - 400 K/uL   nRBC 0.0 0.0 - 0.2 %      Assessment & Plan:  Problem List Items Addressed This Visit     Chronic anxiety - Primary   Relevant Medications   hydrOXYzine (VISTARIL) 25 MG capsule   Chronic obstructive pulmonary disease ([place])   Relevant Orders   Ambulatory referral to  Pulmonology   Coronary artery disease of native artery of native heart with stable angina pectoris (HCC)   Drug-induced myopathy    Cannot tolerate statin therapy due to drug induced myopathy      Elevated hemoglobin A1c   Essential hypertension   Other Visit Diagnoses     Mixed hyperlipidemia       Pruritus       Relevant Medications   hydrOXYzine (VISTARIL) 25 MG capsule   Eczema of external ear, right       Relevant Medications   acetic acid-hydrocortisone (VOSOL-HC) OTIC solution      Anxiety - improved  Buspar 37m x 2 in AM = 145m and take 1 in the PM if needed. Max is 3 per day.  COPD Centrilobular Emphysema [facility] use, seems ineffective but question if proper technique.  [facility]  [facility] [address]: [phone] placard.  Pruritus Uncertain exact etiology Seems psychological component with itch scratch cycle On Hydroxyzine AS NEEDED, was 105mill dose inc to 43m38mke TWICE A DAY mostly Future consider Doxepin but concern with side effect w/ other mental health medications drug interaction risk  R Ear External canal Eczema Exam with some slight dry flaking of skin in canal Trial on ear drops acetic acid hydrocortisone, use small amount and use wick as prescribed to avoid issues.   Orders Placed This Encounter  Procedures   Ambulatory referral to Pulmonology    Referral Priority:   Routine    Referral Type:   Consultation    Referral Reason:   Specialty Services Required    Requested Specialty:   Pulmonary Disease    Number of Visits Requested:   1     Meds ordered this encounter  Medications   hydrOXYzine (VISTARIL) 25 MG capsule    Sig: Take 1 capsule (25 mg total) by mouth every 8 (eight) hours as needed.    Dispense:  90 capsule    Refill:  3   acetic acid-hydrocortisone  (VOSOL-HC) OTIC solution    Sig: Place 3 drops into both ears 3 (three) times daily. May use wick for first 24 hours    Dispense:  10 mL    Refill:  0     Follow up plan: Return in about 4 months (around 04/11/2023) for 4 month follow-up after Pulm, updates anxiety, cardiology.   AlexNobie Putnam [facility] 12/11/2022, 10:57 AM

## 2022-12-11 NOTE — Assessment & Plan Note (Signed)
Cannot tolerate statin therapy due to drug induced myopathy

## 2022-12-31 ENCOUNTER — Ambulatory Visit
Admission: RE | Admit: 2022-12-31 | Discharge: 2022-12-31 | Disposition: A | Discharge status: Home / Self Care | Payer: Medicare HMO | Source: Ambulatory Visit | Attending: Student in an Organized Health Care Education/Training Program | Admitting: Student in an Organized Health Care Education/Training Program

## 2022-12-31 ENCOUNTER — Encounter: Payer: Self-pay | Admitting: Student in an Organized Health Care Education/Training Program

## 2022-12-31 ENCOUNTER — Ambulatory Visit (INDEPENDENT_AMBULATORY_CARE_PROVIDER_SITE_OTHER): Payer: Medicare HMO | Admitting: Student in an Organized Health Care Education/Training Program

## 2022-12-31 VITALS — BP 136/82 | HR 64 | Temp 97.9°F | Ht 67.0 in | Wt 176.0 lb

## 2022-12-31 DIAGNOSIS — R911 Solitary pulmonary nodule: Secondary | ICD-10-CM

## 2022-12-31 DIAGNOSIS — R0602 Shortness of breath: Secondary | ICD-10-CM

## 2022-12-31 NOTE — Progress Notes (Signed)
Synopsis: Referred in for shortness of breath by [place], [place]:   1. Shortness of breath  The patient is presenting today for the evaluation of shortness of breath in the setting of smoking history as well as notable central obesity. I've reviewed her previous imaging (lung images from abdominal CT in 2022 and coronary CT in 2021) and her lung parenchyma appears to be within normal. Her exam today is re-assuring. She certainly could have COPD and I will obtain a pulmonary function test to work that up. I will also obtain a CXR today. She will continue using her Breztri in addition to as needed albuterol. Finally, I did counsel Kristin Parker at length that weight loss will be beneficial to her and will help with her breathing symptoms. She was counseled to decrease the amount of carbohydrates she takes and I will also refer her to a nutritionist.  - Amb ref to Medical Nutrition Therapy-MNT - Pulmonary Function Test [facility] Only; Future - DG Chest 2 View; Future   Return in about 3 months (around 04/02/2023).  I spent 45 minutes caring for this patient today, including preparing to see the patient, obtaining a medical history , reviewing a separately obtained history, performing a medically appropriate examination and/or evaluation, counseling and educating the patient/family/caregiver, ordering medications, tests, or procedures, documenting clinical information in the electronic health record, and independently interpreting results (not separately reported/billed) and communicating results to the patient/family/caregiver  Armando Reichert, MD [facility] 12/31/2022 3:01 PM    End of visit medications:  No orders of the defined types were placed in this encounter.    Current Outpatient Medications:    acetaminophen (TYLENOL) 500 MG tablet, Take 2 tablets (1,000 mg total) by mouth every 6 (six) hours as needed for mild pain., Disp: , Rfl:    acetic  acid-hydrocortisone (VOSOL-HC) OTIC solution, Place 3 drops into both ears 3 (three) times daily. May use wick for first 24 hours, Disp: 10 mL, Rfl: 0   albuterol (VENTOLIN HFA) 108 (90 Base) MCG/ACT inhaler, INHALE 2 PUFFS EVERY 4 HOURS FOR WHEEZING OR SHORTNESS OF BREATH AS NEEDED, Disp: 18 g, Rfl: 3   amLODipine (NORVASC) 10 MG tablet, Take 1 tablet (10 mg total) by mouth daily., Disp: 90 tablet, Rfl: 3   aspirin EC 81 MG tablet, Take 1 tablet (81 mg total) by mouth daily., Disp: , Rfl:    BREZTRI AEROSPHERE 160-9-4.8 MCG/ACT AERO, Inhale 2 puffs into the lungs 2 (two) times daily., Disp: 10.7 g, Rfl: 11   busPIRone (BUSPAR) 5 MG tablet, TAKE 1-2 TABLETS BY MOUTH 3 TIMES DAILY AS NEEDED, Disp: 90 tablet, Rfl: 3   famotidine (PEPCID) 20 MG tablet, Take 1 tablet (20 mg total) by mouth 2 (two) times daily., Disp: 180 tablet, Rfl: 3   fluticasone (FLONASE) 50 MCG/ACT nasal spray, USE 2 SPRAYS INTO EACH NOSTRIL ONCE DAILY, Disp: 16 g, Rfl: 0   hydrOXYzine (VISTARIL) 25 MG capsule, Take 1 capsule (25 mg total) by mouth every 8 (eight) hours as needed., Disp: 90 capsule, Rfl: 3   icosapent Ethyl (VASCEPA) 1 g capsule, Take 2 capsules (2 g total) by mouth 2 (two) times daily., Disp: 360 capsule, Rfl: 3   isosorbide mononitrate (IMDUR) 60 MG 24 hr tablet, TAKE 1 TABLET BY MOUTH ONCE DAILY, Disp: 90 tablet, Rfl: 0   losartan (COZAAR) 100 MG tablet, Take 1 tablet (100 mg total) by mouth daily., Disp: 90 tablet, Rfl: 3  meloxicam (MOBIC) 15 MG tablet, TAKE 1 TABLET BY MOUTH ONCE DAILY AS NEEDED FOR PAIN WITH FOOD, Disp: 30 tablet, Rfl: 5   REPATHA SURECLICK XX123456 MG/ML SOAJ, INJECT 1 DOSE INTO THE SKIN EVERY 14 DAYS, Disp: 2 mL, Rfl: 11   sertraline (ZOLOFT) 100 MG tablet, Take 1 tablet (100 mg total) by mouth daily., Disp: 90 tablet, Rfl: 3   Subjective:   PATIENT ID: Kristin Parker GENDER: female DOB: [birth year], MRN: [id number]  Chief Complaint  Patient presents with   pulmonary consult    SOB with  exertion and wheezing.     HPI  Patient is a pleasant 72 year old female presenting to clinic for the evaluation of shortness of breath.  She is reporting exertional dyspnea that has been ongoing over the past two years. The patient endorses an associated dry cough, but denies chest tightness, wheezing, or sputum production. She does not have any chest pain, night sweats, or weight loss. She reports gaining weight over the past few years. She feels very short of breath bending forward as well as laying on her back. She is unsure why she can't loose weight and reports central obesity. She has been seen by gastroenterology and the workup was benign. Her diet is heavy in carbohydrates (pasta, bread, potatoes, rice) and sugary drinks (plenty of regular pepsi). She is a former smoker, and reports quitting a couple of years ago, with around 20-30 pack years of smoking history reported. She was started on COPD treatment with Breztri by her primary care physician, with improvement (but not complete resolution of her symptoms).   Ancillary information including prior medications, full medical/surgical/family/social histories, and PFTs (when available) are listed below and have been reviewed.   Review of Systems  Constitutional:  Negative for chills, diaphoresis, fever, malaise/fatigue and weight loss.  Respiratory:  Positive for cough and shortness of breath. Negative for hemoptysis, sputum production and wheezing.   Cardiovascular:  Negative for chest pain.  Skin:  Negative for rash.     Objective:   Vitals:   12/31/22 1440 12/31/22 1441  BP: 136/82 136/82  Pulse: 64 64  Temp: 97.9 F (36.6 C) 97.9 F (36.6 C)  TempSrc: Temporal Temporal  SpO2: 92% 92%  Weight: 176 lb (79.8 kg) 176 lb (79.8 kg)  Height: '5\' 7"'$  (1.702 m) '5\' 7"'$  (1.702 m)   92% on RA BMI Readings from Last 3 Encounters:  12/31/22 27.57 kg/m  12/11/22 27.41 kg/m  10/17/22 27.25 kg/m   Wt Readings from Last 3 Encounters:   12/31/22 176 lb (79.8 kg)  12/11/22 175 lb (79.4 kg)  10/17/22 174 lb (78.9 kg)    Physical Exam Constitutional:      Appearance: She is obese. She is not ill-appearing.  HENT:     Head: Normocephalic.     Nose: Nose normal.     Mouth/Throat:     Mouth: Mucous membranes are moist.  Cardiovascular:     Rate and Rhythm: Normal rate and regular rhythm.     Pulses: Normal pulses.     Heart sounds: Normal heart sounds.  Pulmonary:     Effort: Pulmonary effort is normal.     Breath sounds: Normal breath sounds. No wheezing or rales.  Abdominal:     General: There is distension.     Palpations: Abdomen is soft.  Neurological:     General: No focal deficit present.     Mental Status: She is alert and oriented to person, place, and  time. Mental status is at baseline.     Ancillary Information    Past Medical History:  Diagnosis Date   Anxiety    Aortic valve sclerosis    Bilateral carotid bruits    Carotid artery stenosis    COPD (chronic obstructive pulmonary disease) (HCC)    Hypertension    Mitral regurgitation    Mixed hyperlipidemia    Shoulder pain    Sleeping difficulties      Family History  Problem Relation Age of Onset   Cancer Mother        bladder cancer   Heart disease Mother 44       Pacemaker   Cancer Father        lung   Multiple sclerosis Sister    Valvular heart disease Brother 87       s/p bioprosthetic valve replacement at [facility]   Breast cancer Neg Hx      Past Surgical History:  Procedure Laterality Date   BREAST BIOPSY Left 08/13/2018   affirm bx, x clip, path pending   CARDIAC CATHETERIZATION     CATARACT EXTRACTION Left    2017   CHOLECYSTECTOMY     COLONOSCOPY  2012   Dr Candace Cruise   COLONOSCOPY WITH PROPOFOL N/A 05/15/2021   Procedure: COLONOSCOPY WITH PROPOFOL;  Surgeon: Jonathon Bellows, MD;  Location: [facility] ENDOSCOPY;  Service: Gastroenterology;  Laterality: N/A;   COLONOSCOPY WITH PROPOFOL N/A 07/03/2022   Procedure: COLONOSCOPY WITH  PROPOFOL;  Surgeon: Jonathon Bellows, MD;  Location: [facility] ENDOSCOPY;  Service: Gastroenterology;  Laterality: N/A;   EYE SURGERY     LEFT HEART CATH AND CORONARY ANGIOGRAPHY N/A 06/25/2017   Procedure: LEFT HEART CATH AND CORONARY ANGIOGRAPHY;  Surgeon: [name], MD;  Location: [facility];  Service: Cardiovascular;  Laterality: N/A;    Social History   Socioeconomic History   Marital status: Divorced    Spouse name: Not on file   Number of children: Not on file   Years of education: Not on file   Highest education level: GED or equivalent  Occupational History   Occupation: retired   Tobacco Use   Smoking status: Former    Packs/day: 0.50    Years: 30.00    Total pack years: 15.00    Types: Cigarettes    Quit date: 10/30/2020    Years since quitting: 2.1   Smokeless tobacco: Never   Tobacco comments:    Quit Jan 2022  Vaping Use   Vaping Use: Former  Substance and Sexual Activity   Alcohol use: No    Alcohol/week: 0.0 standard drinks of alcohol   Drug use: No   Sexual activity: Not on file  Other Topics Concern   Not on file  Social History Narrative   Not on file   Social Determinants of Health   Financial Resource Strain: Low Risk  (01/05/2022)   Overall Financial Resource Strain (CARDIA)    Difficulty of Paying Living Expenses: Not hard at all  Food Insecurity: No Food Insecurity (01/05/2022)   Hunger Vital Sign    Worried About Running Out of Food in the Last Year: Never true    [place] in the Last Year: Never true  Transportation Needs: No Transportation Needs (01/05/2022)   PRAPARE - Hydrologist (Medical): No    Lack of Transportation (Non-Medical): No  Physical Activity: Insufficiently Active (01/05/2022)   Exercise Vital Sign    Days of Exercise per  Week: 4 days    Minutes of Exercise per Session: 30 min  Stress: No Stress Concern Present (01/05/2022)   [place]    Feeling of Stress : Only a little  Social Connections: Socially Isolated (01/05/2022)   Social Connection and Isolation Panel [NHANES]    Frequency of Communication with Friends and Family: More than three times a week    Frequency of Social Gatherings with Friends and Family: More than three times a week    Attends Religious Services: Never    Marine scientist or Organizations: No    Attends Archivist Meetings: Never    Marital Status: Divorced  Human resources officer Violence: Not At Risk (01/05/2022)   Humiliation, Afraid, Rape, and Kick questionnaire    Fear of Current or Ex-Partner: No    Emotionally Abused: No    Physically Abused: No    Sexually Abused: No     Allergies  Allergen Reactions   Bactrim [Sulfamethoxazole-Trimethoprim] Nausea And Vomiting   Aleve [Naproxen Sodium] Swelling    Lips. But patient can take Ibuprofen without problems   Atorvastatin     myalgias   Crestor [Rosuvastatin]     '5mg'$ , '20mg'$  - myalgias   Simvastatin     myalgias   Zetia [Ezetimibe]     myalgias     CBC    Component Value Date/Time   WBC 9.1 10/17/2022 0943   RBC 4.71 10/17/2022 0943   HGB 14.2 10/17/2022 0943   HGB 14.6 07/22/2019 1034   HCT 42.9 10/17/2022 0943   HCT 42.1 07/22/2019 1034   PLT 279 10/17/2022 0943   PLT 267 07/22/2019 1034   MCV 91.1 10/17/2022 0943   MCV 90 07/22/2019 1034   MCH 30.1 10/17/2022 0943   MCHC 33.1 10/17/2022 0943   RDW 12.8 10/17/2022 0943   RDW 12.9 07/22/2019 1034   LYMPHSABS 2,512 09/06/2021 0820   MONOABS 0.6 06/24/2017 1616   EOSABS 328 09/06/2021 0820   BASOSABS 78 09/06/2021 0820    Pulmonary Functions Testing Results:     No data to display          Outpatient Medications Prior to Visit  Medication Sig Dispense Refill   acetaminophen (TYLENOL) 500 MG tablet Take 2 tablets (1,000 mg total) by mouth every 6 (six) hours as needed for mild pain.     acetic acid-hydrocortisone (VOSOL-HC) OTIC  solution Place 3 drops into both ears 3 (three) times daily. May use wick for first 24 hours 10 mL 0   albuterol (VENTOLIN HFA) 108 (90 Base) MCG/ACT inhaler INHALE 2 PUFFS EVERY 4 HOURS FOR WHEEZING OR SHORTNESS OF BREATH AS NEEDED 18 g 3   amLODipine (NORVASC) 10 MG tablet Take 1 tablet (10 mg total) by mouth daily. 90 tablet 3   aspirin EC 81 MG tablet Take 1 tablet (81 mg total) by mouth daily.     BREZTRI AEROSPHERE 160-9-4.8 MCG/ACT AERO Inhale 2 puffs into the lungs 2 (two) times daily. 10.7 g 11   busPIRone (BUSPAR) 5 MG tablet TAKE 1-2 TABLETS BY MOUTH 3 TIMES DAILY AS NEEDED 90 tablet 3   famotidine (PEPCID) 20 MG tablet Take 1 tablet (20 mg total) by mouth 2 (two) times daily. 180 tablet 3   fluticasone (FLONASE) 50 MCG/ACT nasal spray USE 2 SPRAYS INTO EACH NOSTRIL ONCE DAILY 16 g 0   hydrOXYzine (VISTARIL) 25 MG capsule Take 1 capsule (25 mg total) by mouth  every 8 (eight) hours as needed. 90 capsule 3   icosapent Ethyl (VASCEPA) 1 g capsule Take 2 capsules (2 g total) by mouth 2 (two) times daily. 360 capsule 3   isosorbide mononitrate (IMDUR) 60 MG 24 hr tablet TAKE 1 TABLET BY MOUTH ONCE DAILY 90 tablet 0   losartan (COZAAR) 100 MG tablet Take 1 tablet (100 mg total) by mouth daily. 90 tablet 3   meloxicam (MOBIC) 15 MG tablet TAKE 1 TABLET BY MOUTH ONCE DAILY AS NEEDED FOR PAIN WITH FOOD 30 tablet 5   REPATHA SURECLICK [id number] MG/ML SOAJ INJECT 1 DOSE INTO THE SKIN EVERY 14 DAYS 2 mL 11   sertraline (ZOLOFT) 100 MG tablet Take 1 tablet (100 mg total) by mouth daily. 90 tablet 3   No facility-administered medications prior to visit.

## 2023-01-01 NOTE — Addendum Note (Signed)
Addended byArmando Reichert on: 01/01/2023 03:50 PM   Modules accepted: Orders

## 2023-01-11 ENCOUNTER — Ambulatory Visit
Admission: RE | Admit: 2023-01-11 | Discharge: 2023-01-11 | Disposition: A | Discharge status: Home / Self Care | Payer: Medicare HMO | Source: Ambulatory Visit | Attending: Student in an Organized Health Care Education/Training Program | Admitting: Student in an Organized Health Care Education/Training Program

## 2023-01-11 DIAGNOSIS — R911 Solitary pulmonary nodule: Secondary | ICD-10-CM | POA: Diagnosis not present

## 2023-01-11 DIAGNOSIS — R0602 Shortness of breath: Secondary | ICD-10-CM | POA: Insufficient documentation

## 2023-01-11 DIAGNOSIS — R918 Other nonspecific abnormal finding of lung field: Secondary | ICD-10-CM | POA: Diagnosis not present

## 2023-01-11 DIAGNOSIS — J439 Emphysema, unspecified: Secondary | ICD-10-CM | POA: Diagnosis not present

## 2023-01-17 ENCOUNTER — Telehealth: Payer: Self-pay | Admitting: Student in an Organized Health Care Education/Training Program

## 2023-01-17 NOTE — Telephone Encounter (Signed)
CT was done on 01/11/2023.

## 2023-01-18 NOTE — Telephone Encounter (Signed)
Noted.  Will close encounter.  

## 2023-01-31 ENCOUNTER — Telehealth: Payer: Self-pay

## 2023-01-31 NOTE — Telephone Encounter (Signed)
This is a reminder that Dr. Saunders Revel has ordered lab work for you.   Please go to the [facility] entrance and check in at the front desk.  You do not need an appointment.  They are open from 7am-6 pm.  You will need to be fasting.    Nurse made pt aware Pt verbalized understanding

## 2023-02-02 ENCOUNTER — Other Ambulatory Visit: Payer: Self-pay | Admitting: Family Medicine

## 2023-02-02 DIAGNOSIS — G8929 Other chronic pain: Secondary | ICD-10-CM

## 2023-02-04 NOTE — Telephone Encounter (Signed)
Requested Prescriptions  Pending Prescriptions Disp Refills   meloxicam (MOBIC) 15 MG tablet [Pharmacy Med Name: MELOXICAM 15 MG TAB] 30 tablet 5    Sig: TAKE 1 TABLET BY MOUTH ONCE DAILY AS NEEDED FOR PAIN WITH FOOD     Analgesics:  COX2 Inhibitors Failed - 02/02/2023  1:27 PM      Failed - Manual Review: Labs are only required if the patient has taken medication for more than 8 weeks.      Failed - Cr in normal range and within 360 days    Creat  Date Value Ref Range Status  09/06/2021 0.82 0.60 - 1.00 mg/dL Final   Creatinine, Ser  Date Value Ref Range Status  10/17/2022 1.01 (H) 0.44 - 1.00 mg/dL Final         Passed - HGB in normal range and within 360 days    Hemoglobin  Date Value Ref Range Status  10/17/2022 14.2 12.0 - 15.0 g/dL Final  83/15/1761 60.7 11.1 - 15.9 g/dL Final         Passed - HCT in normal range and within 360 days    HCT  Date Value Ref Range Status  10/17/2022 42.9 36.0 - 46.0 % Final   Hematocrit  Date Value Ref Range Status  07/22/2019 42.1 34.0 - 46.6 % Final         Passed - AST in normal range and within 360 days    AST  Date Value Ref Range Status  10/17/2022 21 15 - 41 U/L Final         Passed - ALT in normal range and within 360 days    ALT  Date Value Ref Range Status  10/17/2022 23 0 - 44 U/L Final         Passed - eGFR is 30 or above and within 360 days    GFR, Est African American  Date Value Ref Range Status  03/01/2021 69 > OR = 60 mL/min/1.44m2 Final   GFR, Est Non African American  Date Value Ref Range Status  03/01/2021 60 > OR = 60 mL/min/1.61m2 Final   GFR, Estimated  Date Value Ref Range Status  10/17/2022 59 (L) >60 mL/min Final    Comment:    (NOTE) Calculated using the CKD-EPI Creatinine Equation (2021)    eGFR  Date Value Ref Range Status  09/06/2021 76 > OR = 60 mL/min/1.46m2 Final    Comment:    The eGFR is based on the CKD-EPI 2021 equation. To calculate  the new eGFR from a previous Creatinine or  Cystatin C result, go to https://www.kidney.org/professionals/ kdoqi/gfr%[email or web address]          Passed - Patient is not pregnant      Passed - Valid encounter within last 12 months    Recent Outpatient Visits           1 month ago Chronic anxiety   [place] [facility] Smitty Cords, DO   4 months ago Chronic anxiety   [facility] Smitty Cords, DO   1 year ago COPD with acute exacerbation [facility])   [facility] [place] [facility] Smitty Cords, DO   1 year ago Osteoarthritis of glenohumeral joint, right   [facility] Smitty Cords, DO   1 year ago Annual physical exam   [facility] Smitty Cords, DO       Future  Appointments             In 1 month Raechel Chute, MD [facility] [place]   In 2 months Althea Charon, Netta Neat, DO [facility] [place], [facility]

## 2023-02-07 ENCOUNTER — Ambulatory Visit (INDEPENDENT_AMBULATORY_CARE_PROVIDER_SITE_OTHER): Payer: Medicare HMO

## 2023-02-07 VITALS — BP 122/74 | Ht 67.0 in | Wt 177.0 lb

## 2023-02-07 DIAGNOSIS — Z780 Asymptomatic menopausal state: Secondary | ICD-10-CM

## 2023-02-07 DIAGNOSIS — Z0000 Encounter for general adult medical examination without abnormal findings: Secondary | ICD-10-CM | POA: Diagnosis not present

## 2023-02-07 NOTE — Patient Instructions (Signed)
Kristin Parker , Thank you for taking time to come for your Medicare Wellness Visit. I appreciate your ongoing commitment to your health goals. Please review the following plan we discussed and let me know if I can assist you in the future.   These are the goals we discussed:  Goals      DIET - EAT MORE FRUITS AND VEGETABLES     DIET - INCREASE WATER INTAKE     Quit smoking / using tobacco     Smoking cessation discussed        This is a list of the screening recommended for you and due dates:  Health Maintenance  Topic Date Due   COVID-19 Vaccine (1) Never done   DTaP/Tdap/Td vaccine (1 - Tdap) Never done   Zoster (Shingles) Vaccine (1 of 2) Never done   DEXA scan (bone density measurement)  Never done   Mammogram  09/11/2023*   Pneumonia Vaccine (1 of 2 - PCV) 12/31/2023*   Flu Shot  05/30/2023   Medicare Annual Wellness Visit  02/07/2024   Colon Cancer Screening  07/03/2025   Hepatitis C Screening: USPSTF Recommendation to screen - Ages 18-79 yo.  Completed   HPV Vaccine  Aged Out  *Topic was postponed. The date shown is not the original due date.   Appointment w/ Dr.Dgayli on 04/02/23 @ 10:15  Advanced directives: no  Conditions/risks identified: none  Next appointment: Follow up in one year for your annual wellness visit 02/13/24 @ 3:00 pm in person   Preventive Care 65 Years and Older, Female Preventive care refers to lifestyle choices and visits with your health care provider that can promote health and wellness. What does preventive care include? A yearly physical exam. This is also called an annual well check. Dental exams once or twice a year. Routine eye exams. Ask your health care provider how often you should have your eyes checked. Personal lifestyle choices, including: Daily care of your teeth and gums. Regular physical activity. Eating a healthy diet. Avoiding tobacco and drug use. Limiting alcohol use. Practicing safe sex. Taking low-dose aspirin every  day. Taking vitamin and mineral supplements as recommended by your health care provider. What happens during an annual well check? The services and screenings done by your health care provider during your annual well check will depend on your age, overall health, lifestyle risk factors, and family history of disease. Counseling  Your health care provider may ask you questions about your: Alcohol use. Tobacco use. Drug use. Emotional well-being. Home and relationship well-being. Sexual activity. Eating habits. History of falls. Memory and ability to understand (cognition). Work and work Astronomer. Reproductive health. Screening  You may have the following tests or measurements: Height, weight, and BMI. Blood pressure. Lipid and cholesterol levels. These may be checked every 5 years, or more frequently if you are over 14 years old. Skin check. Lung cancer screening. You may have this screening every year starting at age 44 if you have a 30-pack-year history of smoking and currently smoke or have quit within the past 15 years. Fecal occult blood test (FOBT) of the stool. You may have this test every year starting at age 77. Flexible sigmoidoscopy or colonoscopy. You may have a sigmoidoscopy every 5 years or a colonoscopy every 10 years starting at age 66. Hepatitis C blood test. Hepatitis B blood test. Sexually transmitted disease (STD) testing. Diabetes screening. This is done by checking your blood sugar (glucose) after you have not eaten for a while (  fasting). You may have this done every 1-3 years. Bone density scan. This is done to screen for osteoporosis. You may have this done starting at age 12. Mammogram. This may be done every 1-2 years. Talk to your health care provider about how often you should have regular mammograms. Talk with your health care provider about your test results, treatment options, and if necessary, the need for more tests. Vaccines  Your health care  provider may recommend certain vaccines, such as: Influenza vaccine. This is recommended every year. Tetanus, diphtheria, and acellular pertussis (Tdap, Td) vaccine. You may need a Td booster every 10 years. Zoster vaccine. You may need this after age 25. Pneumococcal 13-valent conjugate (PCV13) vaccine. One dose is recommended after age 21. Pneumococcal polysaccharide (PPSV23) vaccine. One dose is recommended after age 96. Talk to your health care provider about which screenings and vaccines you need and how often you need them. This information is not intended to replace advice given to you by your health care provider. Make sure you discuss any questions you have with your health care provider. Document Released: 11/11/2015 Document Revised: 07/04/2016 Document Reviewed: 08/16/2015 Elsevier Interactive Patient Education  2017 [employer] Prevention in the Home Falls can cause injuries. They can happen to people of all ages. There are many things you can do to make your home safe and to help prevent falls. What can I do on the outside of my home? Regularly fix the edges of walkways and driveways and fix any cracks. Remove anything that might make you trip as you walk through a door, such as a raised step or threshold. Trim any bushes or trees on the path to your home. Use bright outdoor lighting. Clear any walking paths of anything that might make someone trip, such as rocks or tools. Regularly check to see if handrails are loose or broken. Make sure that both sides of any steps have handrails. Any raised decks and porches should have guardrails on the edges. Have any leaves, snow, or ice cleared regularly. Use sand or salt on walking paths during winter. Clean up any spills in your garage right away. This includes oil or grease spills. What can I do in the bathroom? Use night lights. Install grab bars by the toilet and in the tub and shower. Do not use towel bars as grab  bars. Use non-skid mats or decals in the tub or shower. If you need to sit down in the shower, use a plastic, non-slip stool. Keep the floor dry. Clean up any water that spills on the floor as soon as it happens. Remove soap buildup in the tub or shower regularly. Attach bath mats securely with double-sided non-slip rug tape. Do not have throw rugs and other things on the floor that can make you trip. What can I do in the bedroom? Use night lights. Make sure that you have a light by your bed that is easy to reach. Do not use any sheets or blankets that are too big for your bed. They should not hang down onto the floor. Have a firm chair that has side arms. You can use this for support while you get dressed. Do not have throw rugs and other things on the floor that can make you trip. What can I do in the kitchen? Clean up any spills right away. Avoid walking on wet floors. Keep items that you use a lot in easy-to-reach places. If you need to reach something above you, use  a strong step stool that has a grab bar. Keep electrical cords out of the way. Do not use floor polish or wax that makes floors slippery. If you must use wax, use non-skid floor wax. Do not have throw rugs and other things on the floor that can make you trip. What can I do with my stairs? Do not leave any items on the stairs. Make sure that there are handrails on both sides of the stairs and use them. Fix handrails that are broken or loose. Make sure that handrails are as long as the stairways. Check any carpeting to make sure that it is firmly attached to the stairs. Fix any carpet that is loose or worn. Avoid having throw rugs at the top or bottom of the stairs. If you do have throw rugs, attach them to the floor with carpet tape. Make sure that you have a light switch at the top of the stairs and the bottom of the stairs. If you do not have them, ask someone to add them for you. What else can I do to help prevent  falls? Wear shoes that: Do not have high heels. Have rubber bottoms. Are comfortable and fit you well. Are closed at the toe. Do not wear sandals. If you use a stepladder: Make sure that it is fully opened. Do not climb a closed stepladder. Make sure that both sides of the stepladder are locked into place. Ask someone to hold it for you, if possible. Clearly mark and make sure that you can see: Any grab bars or handrails. First and last steps. Where the edge of each step is. Use tools that help you move around (mobility aids) if they are needed. These include: Canes. Walkers. Scooters. Crutches. Turn on the lights when you go into a dark area. Replace any light bulbs as soon as they burn out. Set up your furniture so you have a clear path. Avoid moving your furniture around. If any of your floors are uneven, fix them. If there are any pets around you, be aware of where they are. Review your medicines with your doctor. Some medicines can make you feel dizzy. This can increase your chance of falling. Ask your doctor what other things that you can do to help prevent falls. This information is not intended to replace advice given to you by your health care provider. Make sure you discuss any questions you have with your health care provider. Document Released: 08/11/2009 Document Revised: 03/22/2016 Document Reviewed: 11/19/2014 Elsevier Interactive Patient Education  2017 [employer].

## 2023-02-07 NOTE — Progress Notes (Signed)
Subjective:   Kristin Parker is a 72 y.o. female who presents for Medicare Annual (Subsequent) preventive examination.  Review of Systems     Cardiac Risk Factors include: advanced age (>40men, >33 women);hypertension     Objective:    Today's Vitals   02/07/23 1456 02/07/23 1457  BP: 122/74   Weight: 177 lb (80.3 kg)   Height: 5\' 7"  (1.702 m)   PainSc:  5    Body mass index is 27.72 kg/m.     02/07/2023    3:09 PM 07/03/2022   10:03 AM 01/05/2022   10:31 AM 05/15/2021    9:41 AM 02/18/2021    1:46 AM 02/18/2021    1:45 AM 07/07/2019    3:48 PM  Advanced Directives  Does Patient Have a Medical Advance Directive? No No No No  No No  Would patient like information on creating a medical advance directive? No - Patient declined  No - Patient declined  No - Patient declined      Current Medications (verified) Outpatient Encounter Medications as of 02/07/2023  Medication Sig   acetaminophen (TYLENOL) 500 MG tablet Take 2 tablets (1,000 mg total) by mouth every 6 (six) hours as needed for mild pain.   acetic acid-hydrocortisone (VOSOL-HC) OTIC solution Place 3 drops into both ears 3 (three) times daily. May use wick for first 24 hours   albuterol (VENTOLIN HFA) 108 (90 Base) MCG/ACT inhaler INHALE 2 PUFFS EVERY 4 HOURS FOR WHEEZING OR SHORTNESS OF BREATH AS NEEDED   amLODipine (NORVASC) 10 MG tablet Take 1 tablet (10 mg total) by mouth daily.   aspirin EC 81 MG tablet Take 1 tablet (81 mg total) by mouth daily.   BREZTRI AEROSPHERE 160-9-4.8 MCG/ACT AERO Inhale 2 puffs into the lungs 2 (two) times daily.   busPIRone (BUSPAR) 5 MG tablet TAKE 1-2 TABLETS BY MOUTH 3 TIMES DAILY AS NEEDED   famotidine (PEPCID) 20 MG tablet Take 1 tablet (20 mg total) by mouth 2 (two) times daily.   fluticasone (FLONASE) 50 MCG/ACT nasal spray USE 2 SPRAYS INTO EACH NOSTRIL ONCE DAILY   hydrOXYzine (VISTARIL) 25 MG capsule Take 1 capsule (25 mg total) by mouth every 8 (eight) hours as needed.    icosapent Ethyl (VASCEPA) 1 g capsule Take 2 capsules (2 g total) by mouth 2 (two) times daily.   isosorbide mononitrate (IMDUR) 60 MG 24 hr tablet TAKE 1 TABLET BY MOUTH ONCE DAILY   losartan (COZAAR) 100 MG tablet Take 1 tablet (100 mg total) by mouth daily.   meloxicam (MOBIC) 15 MG tablet TAKE 1 TABLET BY MOUTH ONCE DAILY AS NEEDED FOR PAIN WITH FOOD   REPATHA SURECLICK 140 MG/ML SOAJ INJECT 1 DOSE INTO THE SKIN EVERY 14 DAYS   sertraline (ZOLOFT) 100 MG tablet Take 1 tablet (100 mg total) by mouth daily.   No facility-administered encounter medications on file as of 02/07/2023.    Allergies (verified) Bactrim [sulfamethoxazole-trimethoprim], Aleve [naproxen sodium], Atorvastatin, Crestor [rosuvastatin], Simvastatin, and Zetia [ezetimibe]   History: Past Medical History:  Diagnosis Date   Anxiety    Aortic valve sclerosis    Bilateral carotid bruits    Carotid artery stenosis    COPD (chronic obstructive pulmonary disease)    Hypertension    Mitral regurgitation    Mixed hyperlipidemia    Shoulder pain    Sleeping difficulties    Past Surgical History:  Procedure Laterality Date   BREAST BIOPSY Left 08/13/2018   affirm bx, x clip,  path pending   CARDIAC CATHETERIZATION     CATARACT EXTRACTION Left    2017   CHOLECYSTECTOMY     COLONOSCOPY  2012   Dr Bluford Kaufmann   COLONOSCOPY WITH PROPOFOL N/A 05/15/2021   Procedure: COLONOSCOPY WITH PROPOFOL;  Surgeon: Wyline Mood, MD;  Location: [facility] ENDOSCOPY;  Service: Gastroenterology;  Laterality: N/A;   COLONOSCOPY WITH PROPOFOL N/A 07/03/2022   Procedure: COLONOSCOPY WITH PROPOFOL;  Surgeon: Wyline Mood, MD;  Location: [facility] ENDOSCOPY;  Service: Gastroenterology;  Laterality: N/A;   EYE SURGERY     LEFT HEART CATH AND CORONARY ANGIOGRAPHY N/A 06/25/2017   Procedure: LEFT HEART CATH AND CORONARY ANGIOGRAPHY;  Surgeon: Yvonne Kendall, MD;  Location: [facility] INVASIVE CV LAB;  Service: Cardiovascular;  Laterality: N/A;   Family History  Problem  Relation Age of Onset   Cancer Mother        bladder cancer   Heart disease Mother 27       Pacemaker   Cancer Father        lung   Multiple sclerosis Sister    Valvular heart disease Brother 79       s/p bioprosthetic valve replacement at [facility]   Breast cancer Neg Hx    Social History   Socioeconomic History   Marital status: Divorced    Spouse name: Not on file   Number of children: Not on file   Years of education: Not on file   Highest education level: GED or equivalent  Occupational History   Occupation: retired   Tobacco Use   Smoking status: Former    Packs/day: 0.50    Years: 30.00    Additional pack years: 0.00    Total pack years: 15.00    Types: Cigarettes    Quit date: 10/30/2020    Years since quitting: 2.2   Smokeless tobacco: Never   Tobacco comments:    Quit Jan 2022  Vaping Use   Vaping Use: Former  Substance and Sexual Activity   Alcohol use: No    Alcohol/week: 0.0 standard drinks of alcohol   Drug use: No   Sexual activity: Not on file  Other Topics Concern   Not on file  Social History Narrative   Not on file   Social Determinants of Health   Financial Resource Strain: Low Risk  (02/07/2023)   Overall Financial Resource Strain (CARDIA)    Difficulty of Paying Living Expenses: Not hard at all  Food Insecurity: No Food Insecurity (02/07/2023)   Hunger Vital Sign    Worried About Running Out of Food in the Last Year: Never true    Ran Out of Food in the Last Year: Never true  Transportation Needs: No Transportation Needs (02/07/2023)   PRAPARE - Administrator, Civil Service (Medical): No    Lack of Transportation (Non-Medical): No  Physical Activity: Insufficiently Active (02/07/2023)   Exercise Vital Sign    Days of Exercise per Week: 3 days    Minutes of Exercise per Session: 30 min  Stress: No Stress Concern Present (02/07/2023)   [employer] of Occupational Health - Occupational Stress Questionnaire    Feeling of Stress :  Not at all  Social Connections: Socially Isolated (02/07/2023)   Social Connection and Isolation Panel [NHANES]    Frequency of Communication with Friends and Family: More than three times a week    Frequency of Social Gatherings with Friends and Family: More than three times a week    Attends Religious Services:  Never    Active Member of Clubs or Organizations: No    Attends Banker Meetings: Never    Marital Status: Divorced    Tobacco Counseling Counseling given: Not Answered Tobacco comments: Quit Jan 2022   Clinical Intake:  Pre-visit preparation completed: Yes  Pain : 0-10 Pain Score: 5  Pain Type: Chronic pain Pain Location: Abdomen Pain Orientation: Right, Mid Pain Descriptors / Indicators: Burning     Nutritional Risks: None Diabetes: No  How often do you need to have someone help you when you read instructions, pamphlets, or other written materials from your doctor or pharmacy?: 1 - Never  Diabetic?no  Interpreter Needed?: No  Information entered by :: Kennedy Bucker, LPN   Activities of Daily Living    02/07/2023    3:10 PM  In your present state of health, do you have any difficulty performing the following activities:  Hearing? 0  Vision? 0  Difficulty concentrating or making decisions? 0  Walking or climbing stairs? 1  Dressing or bathing? 0  Doing errands, shopping? 0  Preparing Food and eating ? N  Using the Toilet? N  In the past six months, have you accidently leaked urine? N  Do you have problems with loss of bowel control? N  Managing your Medications? N  Managing your Finances? N  Housekeeping or managing your Housekeeping? N    Patient Care Team: Smitty Cords, DO as PCP - General (Family Medicine) End, Cristal Deer, MD as PCP - Cardiology (Cardiology) Kieth Brightly, MD (General Surgery)  Indicate any recent Medical Services you may have received from other than [facility] providers in the past year (date  may be approximate).     Assessment:   This is a routine wellness examination for Katelyn.  Hearing/Vision screen Hearing Screening - Comments:: No aids Vision Screening - Comments:: Readers- [facility] in [place]  Dietary issues and exercise activities discussed: Current Exercise Habits: Home exercise routine, Type of exercise: walking, Time (Minutes): 30, Frequency (Times/Week): 3, Weekly Exercise (Minutes/Week): 90, Intensity: Mild   Goals Addressed             This Visit's Progress    DIET - INCREASE WATER INTAKE         Depression Screen    02/07/2023    3:04 PM 01/05/2022   10:29 AM 09/11/2021    2:19 PM 03/22/2021    9:34 AM 03/08/2021    1:38 PM 09/08/2020   11:49 AM 12/04/2019    9:06 AM  PHQ 2/9 Scores  PHQ - 2 Score 1 0 0 0 0 1 0  PHQ- 9 Score 4  1  1 4      Fall Risk    02/07/2023    3:10 PM 01/05/2022   10:32 AM 09/11/2021    2:19 PM 03/08/2021    1:37 PM 09/13/2020    1:19 PM  Fall Risk   Falls in the past year? 0 0 0 0 0  Number falls in past yr: 0 0 0 0 0  Injury with Fall? 0 0 0 0 0  Risk for fall due to : No Fall Risks No Fall Risks     Follow up Falls prevention discussed;Falls evaluation completed Falls evaluation completed Falls evaluation completed Falls evaluation completed     FALL RISK PREVENTION PERTAINING TO THE HOME:  Any stairs in or around the home? No  If so, are there any without handrails? No  Home free of loose throw rugs in  walkways, pet beds, electrical cords, etc? Yes  Adequate lighting in your home to reduce risk of falls? Yes   ASSISTIVE DEVICES UTILIZED TO PREVENT FALLS:  Life alert? No  Use of a cane, walker or w/c? No  Grab bars in the bathroom? No  Shower chair or bench in shower? Yes  Elevated toilet seat or a handicapped toilet? No   TIMED UP AND GO:  Was the test performed? Yes .  Length of time to ambulate 10 feet: 4 sec.   Gait steady and fast without use of assistive device  Cognitive Function:         06/24/2018    9:51 AM 05/14/2017    3:49 PM  6CIT Screen  What Year? 0 points 0 points  What month? 0 points 0 points  What time? 0 points 0 points  Count back from 20 0 points 0 points  Months in reverse 0 points 0 points  Repeat phrase 0 points 0 points  Total Score 0 points 0 points    Immunizations  There is no immunization history on file for this patient.  TDAP status: Due, Education has been provided regarding the importance of this vaccine. Advised may receive this vaccine at local pharmacy or Health Dept. Aware to provide a copy of the vaccination record if obtained from local pharmacy or Health Dept. Verbalized acceptance and understanding.  Flu Vaccine status: Declined, Education has been provided regarding the importance of this vaccine but patient still declined. Advised may receive this vaccine at local pharmacy or Health Dept. Aware to provide a copy of the vaccination record if obtained from local pharmacy or Health Dept. Verbalized acceptance and understanding.  Pneumococcal vaccine status: Declined,  Education has been provided regarding the importance of this vaccine but patient still declined. Advised may receive this vaccine at local pharmacy or Health Dept. Aware to provide a copy of the vaccination record if obtained from local pharmacy or Health Dept. Verbalized acceptance and understanding.   Covid-19 vaccine status: Declined, Education has been provided regarding the importance of this vaccine but patient still declined. Advised may receive this vaccine at local pharmacy or Health Dept.or vaccine clinic. Aware to provide a copy of the vaccination record if obtained from local pharmacy or Health Dept. Verbalized acceptance and understanding.  Qualifies for Shingles Vaccine? Yes   Zostavax completed No   Shingrix Completed?: No.    Education has been provided regarding the importance of this vaccine. Patient has been advised to call insurance company to determine out  of pocket expense if they have not yet received this vaccine. Advised may also receive vaccine at local pharmacy or Health Dept. Verbalized acceptance and understanding.  Screening Tests Health Maintenance  Topic Date Due   COVID-19 Vaccine (1) Never done   DTaP/Tdap/Td (1 - Tdap) Never done   Zoster Vaccines- Shingrix (1 of 2) Never done   DEXA SCAN  Never done   MAMMOGRAM  09/11/2023 (Originally 08/06/2020)   Pneumonia Vaccine 42+ Years old (1 of 2 - PCV) 12/31/2023 (Originally 04/04/1957)   INFLUENZA VACCINE  05/30/2023   Medicare Annual Wellness (AWV)  02/07/2024   COLONOSCOPY (Pts 45-52yrs Insurance coverage will need to be confirmed)  07/03/2025   Hepatitis C Screening  Completed   HPV VACCINES  Aged Out    Health Maintenance  Health Maintenance Due  Topic Date Due   COVID-19 Vaccine (1) Never done   DTaP/Tdap/Td (1 - Tdap) Never done   Zoster Vaccines- Shingrix (1  of 2) Never done   DEXA SCAN  Never done    Colorectal cancer screening: Type of screening: Colonoscopy. Completed 07/03/22. Repeat every 3 years  Declined referral for mammogram  Bone Density status: Ordered 02/07/23. Pt provided with contact info and advised to call to schedule appt.  Lung Cancer Screening: (Low Dose CT Chest recommended if Age 43-80 years, 30 pack-year currently smoking OR have quit w/in 15years.) does qualify.   Lung Cancer Screening Referral: had CT a couple months ago  Additional Screening:  Hepatitis C Screening: does qualify; Completed 06/18/18  Vision Screening: Recommended annual ophthalmology exams for early detection of glaucoma and other disorders of the eye. Is the patient up to date with their annual eye exam?  Yes  Who is the provider or what is the name of the office in which the patient attends annual eye exams? [facility] [place] If pt is not established with a provider, would they like to be referred to a provider to establish care? No .   Dental Screening: Recommended annual  dental exams for proper oral hygiene  Community Resource Referral / Chronic Care Management: CRR required this visit?  No   CCM required this visit?  No      Plan:     I have personally reviewed and noted the following in the patient's chart:   Medical and social history Use of alcohol, tobacco or illicit drugs  Current medications and supplements including opioid prescriptions. Patient is not currently taking opioid prescriptions. Functional ability and status Nutritional status Physical activity Advanced directives List of other physicians Hospitalizations, surgeries, and ER visits in previous 12 months Vitals Screenings to include cognitive, depression, and falls Referrals and appointments  In addition, I have reviewed and discussed with patient certain preventive protocols, quality metrics, and best practice recommendations. A written personalized care plan for preventive services as well as general preventive health recommendations were provided to patient.     Hal HopeLorrie S Amiley Shishido, LPN   0/98/11914/08/2023   Nurse Notes: none

## 2023-02-11 ENCOUNTER — Other Ambulatory Visit
Admission: RE | Admit: 2023-02-11 | Discharge: 2023-02-11 | Disposition: A | Discharge status: Home / Self Care | Payer: Medicare HMO | Source: Ambulatory Visit | Attending: Internal Medicine | Admitting: Internal Medicine

## 2023-02-11 DIAGNOSIS — Z79899 Other long term (current) drug therapy: Secondary | ICD-10-CM | POA: Diagnosis not present

## 2023-02-11 LAB — LIPID PANEL
Cholesterol: 171 mg/dL (ref 0–200)
HDL: 34 mg/dL — ABNORMAL LOW (ref >40)
LDL Cholesterol: 76 mg/dL (ref 0–99)
Total CHOL/HDL Ratio: 5 RATIO
Triglycerides: 307 mg/dL — ABNORMAL HIGH (ref <150)
VLDL: 61 mg/dL — ABNORMAL HIGH (ref 0–40)

## 2023-02-11 LAB — ALT: ALT: 22 U/L (ref 0–44)

## 2023-02-15 ENCOUNTER — Other Ambulatory Visit: Payer: Self-pay | Admitting: Internal Medicine

## 2023-02-15 ENCOUNTER — Other Ambulatory Visit: Payer: Self-pay | Admitting: Family Medicine

## 2023-02-15 DIAGNOSIS — J3089 Other allergic rhinitis: Secondary | ICD-10-CM

## 2023-02-15 NOTE — Telephone Encounter (Signed)
Requested Prescriptions  Pending Prescriptions Disp Refills   fluticasone (FLONASE) 50 MCG/ACT nasal spray [Pharmacy Med Name: FLUTICASONE PROPIONATE 50 MCG/ACT N] 48 g 0    Sig: USE 2 SPRAYS INTO EACH NOSTRIL ONCE DAILY     Ear, Nose, and Throat: Nasal Preparations - Corticosteroids Passed - 02/15/2023  4:14 PM      Passed - Valid encounter within last 12 months    Recent Outpatient Visits           2 months ago Chronic anxiety   [place] [facility] Smitty Cords, DO   5 months ago Chronic anxiety   [place] [facility] Smitty Cords, DO   1 year ago COPD with acute exacerbation [facility])   [place] [facility] Smitty Cords, DO   1 year ago Osteoarthritis of glenohumeral joint, right   [place] [facility] Smitty Cords, DO   1 year ago Annual physical exam   [place] [facility] Smitty Cords, DO       Future Appointments             In 1 month Raechel Chute, MD [facility] [place]   In 1 month Althea Charon, Netta Neat, DO [place] [facility]

## 2023-03-01 ENCOUNTER — Encounter
Payer: Medicare HMO | Attending: Student in an Organized Health Care Education/Training Program | Admitting: Skilled Nursing Facility1

## 2023-03-01 ENCOUNTER — Encounter: Payer: Self-pay | Admitting: Skilled Nursing Facility1

## 2023-03-01 VITALS — Ht 67.0 in | Wt 174.5 lb

## 2023-03-01 DIAGNOSIS — E781 Pure hyperglyceridemia: Secondary | ICD-10-CM | POA: Insufficient documentation

## 2023-03-01 DIAGNOSIS — Z713 Dietary counseling and surveillance: Secondary | ICD-10-CM | POA: Diagnosis not present

## 2023-03-01 DIAGNOSIS — R195 Other fecal abnormalities: Secondary | ICD-10-CM | POA: Insufficient documentation

## 2023-03-01 DIAGNOSIS — E663 Overweight: Secondary | ICD-10-CM | POA: Diagnosis not present

## 2023-03-01 DIAGNOSIS — R197 Diarrhea, unspecified: Secondary | ICD-10-CM | POA: Diagnosis not present

## 2023-03-01 DIAGNOSIS — I10 Essential (primary) hypertension: Secondary | ICD-10-CM | POA: Insufficient documentation

## 2023-03-01 DIAGNOSIS — K3189 Other diseases of stomach and duodenum: Secondary | ICD-10-CM | POA: Insufficient documentation

## 2023-03-01 DIAGNOSIS — R0602 Shortness of breath: Secondary | ICD-10-CM | POA: Insufficient documentation

## 2023-03-01 DIAGNOSIS — F419 Anxiety disorder, unspecified: Secondary | ICD-10-CM | POA: Insufficient documentation

## 2023-03-01 DIAGNOSIS — R140 Abdominal distension (gaseous): Secondary | ICD-10-CM | POA: Insufficient documentation

## 2023-03-01 DIAGNOSIS — K5900 Constipation, unspecified: Secondary | ICD-10-CM | POA: Diagnosis not present

## 2023-03-01 DIAGNOSIS — J449 Chronic obstructive pulmonary disease, unspecified: Secondary | ICD-10-CM | POA: Insufficient documentation

## 2023-03-01 DIAGNOSIS — E785 Hyperlipidemia, unspecified: Secondary | ICD-10-CM | POA: Insufficient documentation

## 2023-03-01 DIAGNOSIS — E669 Obesity, unspecified: Secondary | ICD-10-CM | POA: Insufficient documentation

## 2023-03-01 NOTE — Progress Notes (Addendum)
Medical Nutrition Therapy    NUTRITION ASSESSMENT   Clinical Medical Hx: anxiety, COPD, HTN, hyperlipidemia  Medications:  Labs: triglycerides 307 Notable Signs/Symptoms: alternating constipation and lose stools   Lifestyle & Dietary Hx  Communication was difficult even with the use of teach back.   Pt arrives with severely distended abdomen, tight in appearance and is high Pt states is short of breath easily stating it is worsening.  Pt sates she sleeps very well.  Pt states she cannot do much activity due to SOB.  Pt states has dry mouth constantly.  Pt states she has a tight chest a lot.   Pt states she has NEVER tested her well water before.   Pt states her mouth breaks out with acids but is unsure of examples of acids coming, remembering orange juice stating she avoids the things that would break her out.   Due to pts hard distended abdomen, alternation lose stools and constipation along with communication difficulties with pt  a very concise and strict plan was created: she is to follow a isolate protein powder diet with alkaline water ONLY; this will hopefully get her some relief and help educate her on how the GI works so she understands how her food choices affect her health and wellbeing as it pertains to her GI symptoms.    Estimated daily fluid intake:  Supplements: vitamin E and D Sleep: no issues reported  Stress / self-care: high due to SOB Current average weekly physical activity: does yard work  24-Hr Dietary Recall First Meal 9:30am: coffee, jimmy dean sausage biscuit or salad Snack:  Second Meal: hard candy; apple, cranberry, nuts Snack:  Third Meal 4:30-5pm: cheese burger + lettuce + tomato + mayo Snack: yogurt and hard candy cinnamon Beverages: coffee, water, 1 pepsi a day pepsi   NUTRITION INTERVENTION  Nutrition education (E-1) on the following topics:  Common dietary causes alternation lose stools and constipation and abdominal distention     Learning Style & Readiness for Change Teaching method utilized: Visual & Auditory  Demonstrated degree of understanding via: Teach Back  Barriers to learning/adherence to lifestyle change: listening   Goals Established by Pt Try only drinking alkaline water: says on the front of the bottle pH 8.8; aim for 3 smart water bottles  Test your well water today  Get protein isolate only have that for 3 days   MONITORING & EVALUATION Dietary intake, weekly physical activity,  Next Steps  Patient is to follow up: dietitian will call Tuesday for phase 2 plane advancement.

## 2023-03-04 ENCOUNTER — Telehealth: Payer: Self-pay | Admitting: Dietician

## 2023-03-04 NOTE — Telephone Encounter (Signed)
Returned message from patient; she states she followed 3 days of liquid nutrition with protein powder in milk, but has not felt well, and no change in abdominal distention. She ate a [place] sandwich today and felt better afterwards. She will be contacted by RD tomorrow for follow up.

## 2023-03-18 ENCOUNTER — Other Ambulatory Visit: Payer: Self-pay | Admitting: Internal Medicine

## 2023-03-26 ENCOUNTER — Ambulatory Visit: Payer: Medicare HMO | Attending: Student in an Organized Health Care Education/Training Program

## 2023-03-26 DIAGNOSIS — R0602 Shortness of breath: Secondary | ICD-10-CM | POA: Insufficient documentation

## 2023-03-26 DIAGNOSIS — Z87891 Personal history of nicotine dependence: Secondary | ICD-10-CM | POA: Diagnosis not present

## 2023-03-26 LAB — PULMONARY FUNCTION TEST ARMC ONLY
DL/VA % pred: 64 %
DL/VA: 2.59 ml/min/mmHg/L
DLCO unc % pred: 41 %
DLCO unc: 8.83 ml/min/mmHg
FEF 25-75 Post: 0.75 L/sec
FEF 25-75 Pre: 0.65 L/sec
FEF2575-%Change-Post: 15 %
FEF2575-%Pred-Post: 38 %
FEF2575-%Pred-Pre: 33 %
FEV1-%Change-Post: 6 %
FEV1-%Pred-Post: 57 %
FEV1-%Pred-Pre: 53 %
FEV1-Post: 1.41 L
FEV1-Pre: 1.32 L
FEV1FVC-%Change-Post: 3 %
FEV1FVC-%Pred-Pre: 82 %
FEV6-%Change-Post: 3 %
FEV6-%Pred-Post: 69 %
FEV6-%Pred-Pre: 66 %
FEV6-Post: 2.16 L
FEV6-Pre: 2.08 L
FEV6FVC-%Change-Post: 1 %
FEV6FVC-%Pred-Post: 103 %
FEV6FVC-%Pred-Pre: 102 %
FVC-%Change-Post: 2 %
FVC-%Pred-Post: 66 %
FVC-%Pred-Pre: 64 %
FVC-Post: 2.18 L
FVC-Pre: 2.12 L
Post FEV1/FVC ratio: 65 %
Post FEV6/FVC ratio: 99 %
Pre FEV1/FVC ratio: 62 %
Pre FEV6/FVC Ratio: 98 %
RV % pred: 126 %
RV: 3.03 L
TLC % pred: 93 %
TLC: 5.17 L

## 2023-03-26 MED ORDER — ALBUTEROL SULFATE (2.5 MG/3ML) 0.083% IN NEBU
2.5000 mg | INHALATION_SOLUTION | Freq: Once | RESPIRATORY_TRACT | Status: AC
  Administered 2023-03-26: 2.5 mg via RESPIRATORY_TRACT
  Filled 2023-03-26: qty 3

## 2023-04-02 ENCOUNTER — Ambulatory Visit: Payer: Medicare HMO | Admitting: Student in an Organized Health Care Education/Training Program

## 2023-04-03 ENCOUNTER — Ambulatory Visit (INDEPENDENT_AMBULATORY_CARE_PROVIDER_SITE_OTHER): Payer: Medicare HMO | Admitting: Student in an Organized Health Care Education/Training Program

## 2023-04-03 ENCOUNTER — Encounter: Payer: Self-pay | Admitting: Student in an Organized Health Care Education/Training Program

## 2023-04-03 VITALS — BP 134/80 | HR 70 | Temp 97.9°F | Ht 67.0 in | Wt 173.2 lb

## 2023-04-03 DIAGNOSIS — E65 Localized adiposity: Secondary | ICD-10-CM | POA: Diagnosis not present

## 2023-04-03 DIAGNOSIS — R911 Solitary pulmonary nodule: Secondary | ICD-10-CM

## 2023-04-03 DIAGNOSIS — J439 Emphysema, unspecified: Secondary | ICD-10-CM | POA: Diagnosis not present

## 2023-04-03 MED ORDER — TRELEGY ELLIPTA 200-62.5-25 MCG/ACT IN AEPB: 1.0000 | INHALATION_SPRAY | Freq: Every day | RESPIRATORY_TRACT | 12 refills | Status: DC

## 2023-04-03 NOTE — Progress Notes (Signed)
Assessment & Plan:   1. Pulmonary emphysema, unspecified emphysema type (HCC)  The patient is presenting today for the evaluation of shortness of breath in the setting of smoking history as well as notable central obesity.   PFT's on 03/26/2023 showed FEV1/FVC 0.65, and FEV1 of 1.41L (57% predicted). Lung volumes don't show restriction, but do show air trapping. DLCO is significantly reducted consistent with emphysema. ERV significantly reduced at 24% predicted, consistent with central obesity. Chest CT 12/2022 was overall benign, with findings of emphysema and 3 mm nodule. Exam today is re-assuring with no wheezing.  Given spirometry is consistent with COPD, I will prescribe her triple therapy (ICS/LABA/LAMA) for COPD management (she is not using breztri). I will also place a referral to pulmonary rehab.  Finally, I did counsel Kristin Parker at length that weight loss will be beneficial to her and will help with her breathing symptoms. She was counseled to decrease the amount of carbohydrates she takes. I had placed a referral to dietician during her last visit.  - Fluticasone-Umeclidin-Vilant (TRELEGY ELLIPTA) 200-62.5-25 MCG/ACT AEPB; Inhale 1 Inhalation into the lungs daily.  Dispense: 30 each; Refill: 12 - AMB referral to pulmonary rehabilitation   Return in about 6 months (around 10/03/2023).  I spent 30 minutes caring for this patient today, including preparing to see the patient, obtaining a medical history , reviewing a separately obtained history, performing a medically appropriate examination and/or evaluation, counseling and educating the patient/family/caregiver, ordering medications, tests, or procedures, documenting clinical information in the electronic health record, and independently interpreting results (not separately reported/billed) and communicating results to the patient/family/caregiver  Kristin Chute, MD [facility] 04/03/2023 5:51 PM    End of  visit medications:  Meds ordered this encounter  Medications   Fluticasone-Umeclidin-Vilant (TRELEGY ELLIPTA) 200-62.5-25 MCG/ACT AEPB    Sig: Inhale 1 Inhalation into the lungs daily.    Dispense:  30 each    Refill:  12     Current Outpatient Medications:    acetaminophen (TYLENOL) 500 MG tablet, Take 2 tablets (1,000 mg total) by mouth every 6 (six) hours as needed for mild pain., Disp: , Rfl:    acetic acid-hydrocortisone (VOSOL-HC) OTIC solution, Place 3 drops into both ears 3 (three) times daily. May use wick for first 24 hours, Disp: 10 mL, Rfl: 0   albuterol (VENTOLIN HFA) 108 (90 Base) MCG/ACT inhaler, INHALE 2 PUFFS EVERY 4 HOURS FOR WHEEZING OR SHORTNESS OF BREATH AS NEEDED, Disp: 18 g, Rfl: 3   amLODipine (NORVASC) 10 MG tablet, Take 1 tablet (10 mg total) by mouth daily., Disp: 90 tablet, Rfl: 3   aspirin EC 81 MG tablet, Take 1 tablet (81 mg total) by mouth daily., Disp: , Rfl:    busPIRone (BUSPAR) 5 MG tablet, TAKE 1-2 TABLETS BY MOUTH 3 TIMES DAILY AS NEEDED, Disp: 90 tablet, Rfl: 3   Evolocumab (REPATHA SURECLICK) 140 MG/ML SOAJ, INJECT 1 DOSE INTO THE SKIN EVERY 14 DAYS, Disp: 2 mL, Rfl: 5   famotidine (PEPCID) 20 MG tablet, Take 1 tablet (20 mg total) by mouth 2 (two) times daily., Disp: 180 tablet, Rfl: 3   fluticasone (FLONASE) 50 MCG/ACT nasal spray, USE 2 SPRAYS INTO EACH NOSTRIL ONCE DAILY, Disp: 48 g, Rfl: 0   Fluticasone-Umeclidin-Vilant (TRELEGY ELLIPTA) 200-62.5-25 MCG/ACT AEPB, Inhale 1 Inhalation into the lungs daily., Disp: 30 each, Rfl: 12   hydrOXYzine (VISTARIL) 25 MG capsule, Take 1 capsule (25 mg total) by mouth every 8 (eight) hours as  needed., Disp: 90 capsule, Rfl: 3   icosapent Ethyl (VASCEPA) 1 g capsule, Take 2 capsules (2 g total) by mouth 2 (two) times daily., Disp: 360 capsule, Rfl: 3   isosorbide mononitrate (IMDUR) 60 MG 24 hr tablet, Take 1 tablet (60 mg total) by mouth daily., Disp: 90 tablet, Rfl: 0   losartan (COZAAR) 100 MG tablet, Take 1  tablet (100 mg total) by mouth daily., Disp: 90 tablet, Rfl: 3   meloxicam (MOBIC) 15 MG tablet, TAKE 1 TABLET BY MOUTH ONCE DAILY AS NEEDED FOR PAIN WITH FOOD, Disp: 30 tablet, Rfl: 5   sertraline (ZOLOFT) 100 MG tablet, Take 1 tablet (100 mg total) by mouth daily., Disp: 90 tablet, Rfl: 3   Subjective:   PATIENT ID: Kristin Parker GENDER: female DOB: Sep 17, 1951, MRN: [id number]  Chief Complaint  Patient presents with   Follow-up    SOB with exertion and wheezing.     HPI  Patient is a pleasant 72 year old female presenting to clinic for the evaluation of shortness of breath.   She is reporting exertional dyspnea that has been ongoing over the past two years. The patient endorses an associated dry cough, but denies chest tightness, wheezing, or sputum production. She does not have any chest pain, night sweats, or weight loss. She reports gaining weight over the past few years. She feels very short of breath bending forward as well as laying on her back. She is unsure why she can't loose weight and reports central obesity. She has been seen by gastroenterology and the workup was benign. Her diet is heavy in carbohydrates (pasta, bread, potatoes, rice) and sugary drinks (plenty of regular pepsi).  Symptoms are unchanged compared to her last visit. She continues to experience shortness of breath. She is unsure why she can't loose weight and feels that her abdominal girth could be something else. Since our last visit, she underwent a CT scan of the chest and PFT's and is here to discuss results.  She is a former smoker, and reports quitting a couple of years ago, with around 20-30 pack years of smoking history reported. She was started on COPD treatment with Breztri by her primary care physician, with improvement (but not complete resolution of her symptoms).  Ancillary information including prior medications, full medical/surgical/family/social histories, and PFTs (when available) are listed  below and have been reviewed.   Review of Systems  Constitutional:  Negative for chills, diaphoresis, fever, malaise/fatigue and weight loss.  Respiratory:  Positive for cough and shortness of breath. Negative for hemoptysis, sputum production and wheezing.   Cardiovascular:  Negative for chest pain.  Skin:  Negative for rash.     Objective:   Vitals:   04/03/23 1041  BP: 134/80  Pulse: 70  Temp: 97.9 F (36.6 C)  SpO2: 91%  Weight: 173 lb 3.2 oz (78.6 kg)  Height: 5\' 7"  (1.702 m)   91% on RA  BMI Readings from Last 3 Encounters:  04/03/23 27.13 kg/m  03/01/23 27.33 kg/m  02/07/23 27.72 kg/m   Wt Readings from Last 3 Encounters:  04/03/23 173 lb 3.2 oz (78.6 kg)  03/01/23 174 lb 8 oz (79.2 kg)  02/07/23 177 lb (80.3 kg)    Physical Exam Constitutional:      Appearance: She is obese. She is not ill-appearing.  HENT:     Head: Normocephalic.     Nose: Nose normal.     Mouth/Throat:     Mouth: Mucous membranes are moist.  Cardiovascular:     Rate and Rhythm: Normal rate and regular rhythm.     Pulses: Normal pulses.     Heart sounds: Normal heart sounds.  Pulmonary:     Effort: Pulmonary effort is normal.     Breath sounds: Normal breath sounds. No wheezing or rales.  Abdominal:     General: There is distension.     Palpations: Abdomen is soft.  Neurological:     General: No focal deficit present.     Mental Status: She is alert and oriented to person, place, and time. Mental status is at baseline.       Ancillary Information    Past Medical History:  Diagnosis Date   Anxiety    Aortic valve sclerosis    Bilateral carotid bruits    Carotid artery stenosis    COPD (chronic obstructive pulmonary disease) (HCC)    Hypertension    Mitral regurgitation    Mixed hyperlipidemia    Shoulder pain    Sleeping difficulties      Family History  Problem Relation Age of Onset   Cancer Mother        bladder cancer   Heart disease Mother 33        Pacemaker   Cancer Father        lung   Multiple sclerosis Sister    Valvular heart disease Brother 63       [facility]oprost[facility] valve [facility]   Breast cancer Neg Hx      Past Surgical History:  Procedure Laterality Date   BREAST BIOPSY Left 08/13/2018   affirm bx, x clip, path pending   CARDIAC CATHETERIZATION     CATARACT EXTRACTION Left    2017   CHOLECYSTECTOMY     COLONOSCOPY  2012   Dr Bluford Kaufmann   COLONOSCOPY WITH PROPOFOL N/A 05/15/2021   Procedure: COLONOSCOPY WITH PROPOFOL;  Surgeon: Wyline Mood, MD;  Location: [facility] ENDOSCOPY;  Service: Gastroenterology;  Laterality: N/A;   COLONOSCOPY WITH PROPOFOL N/A 07/03/2022   Procedure: COLONOSCOPY WITH PROPOFOL;  Surgeon: Wyline Mood, MD;  Location: [facility] ENDOSCOPY;  Service: Gastroenterology;  Laterality: N/A;   EYE SURGERY     LEFT HEART CATH AND CORONARY ANGIOGRAPHY N/A 06/25/2017   Procedure: LEFT HEART CATH AND CORONARY ANGIOGRAPHY;  Surgeon: Yvonne Kendall, MD;  Location: [facility] INVASIVE CV LAB;  Service: Cardiovascular;  Laterality: N/A;    Social History   Socioeconomic History   Marital status: Divorced    Spouse name: Not on file   Number of children: Not on file   Years of education: Not on file   Highest education level: GED or equivalent  Occupational History   Occupation: retired   Tobacco Use   Smoking status: Former    Packs/day: 0.50    Years: 30.00    Additional pack years: 0.00    Total pack years: 15.00    Types: Cigarettes    Quit date: 10/30/2020    Years since quitting: 2.4   Smokeless tobacco: Never   Tobacco comments:    Quit Jan 2022  Vaping Use   Vaping Use: Former  Substance and Sexual Activity   Alcohol use: No    Alcohol/week: 0.0 standard drinks of alcohol   Drug use: No   Sexual activity: Not on file  Other Topics Concern   Not on file  Social History Narrative   Not on file   Social Determinants of Health   Financial Resource Strain: Low Risk  (  02/07/2023)   Overall  Financial Resource Strain (CARDIA)    Difficulty of Paying Living Expenses: Not hard at all  Food Insecurity: No Food Insecurity (02/07/2023)   Hunger Vital Sign    Worried About Running Out of Food in the Last Year: Never true    Ran Out of Food in the Last Year: Never true  Transportation Needs: No Transportation Needs (02/07/2023)   PRAPARE - Administrator, Civil Service (Medical): No    Lack of Transportation (Non-Medical): No  Physical Activity: Insufficiently Active (02/07/2023)   Exercise Vital Sign    Days of Exercise per Week: 3 days    Minutes of Exercise per Session: 30 min  Stress: No Stress Concern Present (02/07/2023)   [employer] of Occupational Health - Occupational Stress Questionnaire    Feeling of Stress : Not at all  Social Connections: Socially Isolated (02/07/2023)   Social Connection and Isolation Panel [NHANES]    Frequency of Communication with Friends and Family: More than three times a week    Frequency of Social Gatherings with Friends and Family: More than three times a week    Attends Religious Services: Never    Database administrator or Organizations: No    Attends Banker Meetings: Never    Marital Status: Divorced  Catering manager Violence: Not At Risk (02/07/2023)   Humiliation, Afraid, Rape, and Kick questionnaire    Fear of Current or Ex-Partner: No    Emotionally Abused: No    Physically Abused: No    Sexually Abused: No     Allergies  Allergen Reactions   Bactrim [Sulfamethoxazole-Trimethoprim] Nausea And Vomiting   Aleve [Naproxen Sodium] Swelling    Lips. But patient can take Ibuprofen without problems   Atorvastatin     myalgias   Crestor [Rosuvastatin]     5mg , 20mg  - myalgias   Simvastatin     myalgias   Zetia [Ezetimibe]     myalgias     CBC    Component Value Date/Time   WBC 9.1 10/17/2022 0943   RBC 4.71 10/17/2022 0943   HGB 14.2 10/17/2022 0943   HGB 14.6 07/22/2019 1034   HCT 42.9  10/17/2022 0943   HCT 42.1 07/22/2019 1034   PLT 279 10/17/2022 0943   PLT 267 07/22/2019 1034   MCV 91.1 10/17/2022 0943   MCV 90 07/22/2019 1034   MCH 30.1 10/17/2022 0943   MCHC 33.1 10/17/2022 0943   RDW 12.8 10/17/2022 0943   RDW 12.9 07/22/2019 1034   LYMPHSABS 2,512 09/06/2021 0820   MONOABS 0.6 06/24/2017 1616   EOSABS 328 09/06/2021 0820   BASOSABS 78 09/06/2021 0820    Pulmonary Functions Testing Results:    Latest Ref Rng & Units 03/26/2023    2:46 PM  PFT Results  FVC-Pre L 2.12   FVC-Predicted Pre % 64   FVC-Post L 2.18   FVC-Predicted Post % 66   Pre FEV1/FVC % % 62   Post FEV1/FCV % % 65   FEV1-Pre L 1.32   FEV1-Predicted Pre % 53   FEV1-Post L 1.41   DLCO uncorrected ml/min/mmHg 8.83   DLCO UNC% % 41   DLVA Predicted % 64   TLC L 5.17   TLC % Predicted % 93   RV % Predicted % 126     Outpatient Medications Prior to Visit  Medication Sig Dispense Refill   acetaminophen (TYLENOL) 500 MG tablet Take 2 tablets (1,000 mg total) by mouth  every 6 (six) hours as needed for mild pain.     acetic acid-hydrocortisone (VOSOL-HC) OTIC solution Place 3 drops into both ears 3 (three) times daily. May use wick for first 24 hours 10 mL 0   albuterol (VENTOLIN HFA) 108 (90 Base) MCG/ACT inhaler INHALE 2 PUFFS EVERY 4 HOURS FOR WHEEZING OR SHORTNESS OF BREATH AS NEEDED 18 g 3   amLODipine (NORVASC) 10 MG tablet Take 1 tablet (10 mg total) by mouth daily. 90 tablet 3   aspirin EC 81 MG tablet Take 1 tablet (81 mg total) by mouth daily.     busPIRone (BUSPAR) 5 MG tablet TAKE 1-2 TABLETS BY MOUTH 3 TIMES DAILY AS NEEDED 90 tablet 3   Evolocumab (REPATHA SURECLICK) 140 MG/ML SOAJ INJECT 1 DOSE INTO THE SKIN EVERY 14 DAYS 2 mL 5   famotidine (PEPCID) 20 MG tablet Take 1 tablet (20 mg total) by mouth 2 (two) times daily. 180 tablet 3   fluticasone (FLONASE) 50 MCG/ACT nasal spray USE 2 SPRAYS INTO EACH NOSTRIL ONCE DAILY 48 g 0   hydrOXYzine (VISTARIL) 25 MG capsule Take 1  capsule (25 mg total) by mouth every 8 (eight) hours as needed. 90 capsule 3   icosapent Ethyl (VASCEPA) 1 g capsule Take 2 capsules (2 g total) by mouth 2 (two) times daily. 360 capsule 3   isosorbide mononitrate (IMDUR) 60 MG 24 hr tablet Take 1 tablet (60 mg total) by mouth daily. 90 tablet 0   losartan (COZAAR) 100 MG tablet Take 1 tablet (100 mg total) by mouth daily. 90 tablet 3   meloxicam (MOBIC) 15 MG tablet TAKE 1 TABLET BY MOUTH ONCE DAILY AS NEEDED FOR PAIN WITH FOOD 30 tablet 5   sertraline (ZOLOFT) 100 MG tablet Take 1 tablet (100 mg total) by mouth daily. 90 tablet 3   BREZTRI AEROSPHERE 160-9-4.8 MCG/ACT AERO Inhale 2 puffs into the lungs 2 (two) times daily. 10.7 g 11   No facility-administered medications prior to visit.

## 2023-04-09 ENCOUNTER — Ambulatory Visit (INDEPENDENT_AMBULATORY_CARE_PROVIDER_SITE_OTHER): Payer: Medicare HMO | Admitting: Family Medicine

## 2023-04-09 ENCOUNTER — Encounter: Payer: Self-pay | Admitting: Family Medicine

## 2023-04-09 VITALS — BP 122/70 | HR 80 | Temp 98.7°F | Ht 67.0 in | Wt 171.0 lb

## 2023-04-09 DIAGNOSIS — J432 Centrilobular emphysema: Secondary | ICD-10-CM

## 2023-04-09 DIAGNOSIS — F5104 Psychophysiologic insomnia: Secondary | ICD-10-CM

## 2023-04-09 DIAGNOSIS — I10 Essential (primary) hypertension: Secondary | ICD-10-CM

## 2023-04-09 DIAGNOSIS — I700 Atherosclerosis of aorta: Secondary | ICD-10-CM | POA: Insufficient documentation

## 2023-04-09 DIAGNOSIS — I25118 Atherosclerotic heart disease of native coronary artery with other forms of angina pectoris: Secondary | ICD-10-CM | POA: Diagnosis not present

## 2023-04-09 DIAGNOSIS — F419 Anxiety disorder, unspecified: Secondary | ICD-10-CM | POA: Diagnosis not present

## 2023-04-09 MED ORDER — ZOLPIDEM TARTRATE ER 6.25 MG PO TBCR: 6.2500 mg | EXTENDED_RELEASE_TABLET | Freq: Every evening | ORAL | 0 refills | Status: DC | PRN

## 2023-04-09 MED ORDER — BUSPIRONE HCL 5 MG PO TABS: 5.0000 mg | ORAL_TABLET | Freq: Two times a day (BID) | ORAL | 3 refills | Status: DC

## 2023-04-09 NOTE — Patient Instructions (Addendum)
Thank you for coming to the office today.  Refilled Buspar for 180 pills per bottle for up to twice a day dosing.  Start Ambien Zolpidem CR continued release 6.25  Try [place], if too expensive, then go to [facility], let me know which one and I can send new rx and use Goodrx coupon  DUE for FASTING BLOOD WORK (no food or drink after midnight before the lab appointment, only water or coffee without cream/sugar on the morning of)  SCHEDULE "Lab Only" visit in the morning at the clinic for lab draw in 5 MONTHS   - Make sure Lab Only appointment is at about 1 week before your next appointment, so that results will be available  For Lab Results, once available within 2-3 days of blood draw, you can can log in to MyChart online to view your results and a brief explanation. Also, we can discuss results at next follow-up visit.   Please schedule a Follow-up Appointment to: Return in about 5 months (around 09/09/2023) for 5 month Annual Physical AM apt fasting lab AFTER.  If you have any other questions or concerns, please feel free to call the office or send a message through MyChart. You may also schedule an earlier appointment if necessary.  Additionally, you may be receiving a survey about your experience at our office within a few days to 1 week by e-mail or mail. We value your feedback.  [name], DO [facility], [place]

## 2023-04-09 NOTE — Progress Notes (Unsigned)
Subjective:    Patient ID: Kristin Parker, female    DOB: [birth year], 72 y.o.   MRN: [id number]  Kristin Parker is a 72 y.o. female presenting on 04/09/2023 for Anxiety   HPI  Last visit with Cardiology Dr End 09/2022, had labs showed TG 308. LDL 92. She is on Repatha injections, and they added Vascepa for the Triglyceride.   Elevated A1c Prior A1c 5.4 to 5.7 Now recent A1c to 5.6   Meds: Never on med Currently on [facility] Lifestyle: - Diet (admits eats cookies and some sweets, not following low carb diet) - Exercise (limited) Denies hypoglycemia   CHRONIC HTN: Reports no concerns Current Meds - Losartan 100mg  daily, Amlodipine 10mg  daily Reports good compliance, took meds today. Tolerating well, w/o complaints. Denies CP, dyspnea, HA, edema, dizziness / lightheadedness   Hyperlipidemia Followed by [facility], [place] Failed multiple meds in past including statins intermittent statin, rosuvastatin, recently zetia. Had myalgia. History of myalgia on statin, with drug induced myopathy could not tolerate She is doing great on Repatha SureClick, now LDL down from >200 to 160 to 92    COPD / Tobacco Abuse Former smoker now   Centrilobular Emphysema Since last visit 2/13, she has established with [facility] Dr Aundria Rud and has done PFTs and CT imaging. Last visit with them after testing, was 04/03/23. She was switched from [facility] triple therapy over to Trelegy, she had lip irritation on Breztri. Also referred to Pulmonary Rehab program for breathing. - ***   Anxiety Since last visit, started on Buspar 5mg  THREE TIMES A DAY AS NEEDED. She is taking it regularly twice a day morning and evening and doing well. Continues on Sertraline 100mg  daily with good results. Continue on Hydroxyzine 25mg  capsule nightly for itch and anxiety and sleep  Insomnia Difficulty with falling asleep Failed Trazodone, Melatonin, Sertraline, Buspar       02/07/2023    3:04 PM  01/05/2022   10:29 AM 09/11/2021    2:19 PM  Depression screen PHQ 2/9  Decreased Interest 0 0 0  Down, Depressed, Hopeless 1 0 0  PHQ - 2 Score 1 0 0  Altered sleeping 1  1  Tired, decreased energy 1  0  Change in appetite 0  0  Feeling bad or failure about yourself  0  0  Trouble concentrating 1  0  Moving slowly or fidgety/restless 0  0  Suicidal thoughts 0  0  PHQ-9 Score 4  1  Difficult doing work/chores Not difficult at all  Somewhat difficult    Social History   Tobacco Use   Smoking status: Former    Packs/day: 0.50    Years: 30.00    Additional pack years: 0.00    Total pack years: 15.00    Types: Cigarettes    Quit date: 10/30/2020    Years since quitting: 2.4   Smokeless tobacco: Never   Tobacco comments:    Quit Jan 2022  Vaping Use   Vaping Use: Former  Substance Use Topics   Alcohol use: No    Alcohol/week: 0.0 standard drinks of alcohol   Drug use: No    Review of Systems Per HPI unless specifically indicated above     Objective:    BP 122/70   Pulse 80   Temp 98.7 F (37.1 C) (Oral)   Ht 5\' 7"  (1.702 m)   Wt 171 lb (77.6 kg)   SpO2 93%   BMI 26.78 kg/m  Wt Readings from Last 3 Encounters:  04/09/23 171 lb (77.6 kg)  04/03/23 173 lb 3.2 oz (78.6 kg)  03/01/23 174 lb 8 oz (79.2 kg)    Physical Exam  I have personally reviewed the radiology report from 01/11/23 on CT.   CLINICAL DATA:  Follow-up for possible right upper lobe nodule noted chest radiograph dated 12/31/2022.   EXAM: CT CHEST WITHOUT CONTRAST   TECHNIQUE: Multidetector CT imaging of the chest was performed following the standard protocol without IV contrast.   RADIATION DOSE REDUCTION: This exam was performed according to the departmental dose-optimization program which includes automated exposure control, adjustment of the mA and/or kV according to patient size and/or use of iterative reconstruction technique.   COMPARISON:  Chest radiograph, 12/31/2022. Cardiac  CT dated 09/29/2020.   FINDINGS: Cardiovascular: Heart normal in size and configuration. No pericardial effusion. Mild left coronary artery and circumflex coronary artery calcifications. Great vessels are normal in caliber. Aortic atherosclerotic calcifications.   Mediastinum/Nodes: No neck base, mediastinal or hilar masses. No enlarged lymph nodes. Trachea and esophagus are unremarkable.   Lungs/Pleura: Moderate centrilobular emphysema. 3 mm pleural based, triangular shaped nodule, right upper lobe posteriorly adjacent to the superior margin of the right oblique fissure, image 47, series 3. Similar appearing, slightly more irregular pleural base nodule in the left lower lobe superior segment.   No other lung nodules. No masses. No lung consolidation or edema. Minor linear/reticular opacities at the dependent lung bases consistent with atelectasis/scarring.   No pleural effusion or pneumothorax.   Upper Abdomen: Decreased attenuation of the liver consistent with fatty infiltration. Aortic atherosclerosis. No other abnormality. No acute finding.   Musculoskeletal: No fracture or acute finding. No bone lesion. No chest wall mass.   IMPRESSION: 1. Small pleural based nodules, 3 mm, near the same axial level, posterior right upper lobe and posterior left lower lobe superior segment. These are likely benign areas of scarring or atelectasis. The 1 on the right could potentially correspond to the chest radiographic finding although the chest radiographic nodule is more likely to have been due to a vessel en face. No other lung nodules. No masses. No follow-up needed if patient is low-risk (and has no known or suspected primary neoplasm). Non-contrast chest CT can be considered in 12 months if patient is high-risk. This recommendation follows the consensus statement: Guidelines for Management of Incidental Pulmonary Nodules Detected on CT Images: From the Fleischner Society 2017;  Radiology 2017; 284:228-243. 2. No acute findings. 3. Emphysema and aortic atherosclerosis. Mild coronary artery calcifications. 4. Hepatic steatosis.   Aortic Atherosclerosis (ICD10-I70.0) and Emphysema (ICD10-J43.9).     Electronically Signed   By: Amie Portland M.D.   On: 01/14/2023 14:20    Results for orders placed or performed in visit on 03/26/23  Pulmonary Function Test [facility] Only  Result Value Ref Range   FVC-Pre 2.12 L   FVC-%Pred-Pre 64 %   FVC-Post 2.18 L   FVC-%Pred-Post 66 %   FVC-%Change-Post 2 %   FEV1-Pre 1.32 L   FEV1-%Pred-Pre 53 %   FEV1-Post 1.41 L   FEV1-%Pred-Post 57 %   FEV1-%Change-Post 6 %   FEV6-Pre 2.08 L   FEV6-%Pred-Pre 66 %   FEV6-Post 2.16 L   FEV6-%Pred-Post 69 %   FEV6-%Change-Post 3 %   Pre FEV1/FVC ratio 62 %   FEV1FVC-%Pred-Pre 82 %   Post FEV1/FVC ratio 65 %   FEV1FVC-%Change-Post 3 %   Pre FEV6/FVC Ratio 98 %   FEV6FVC-%Pred-Pre 102 %  Post FEV6/FVC ratio 99 %   FEV6FVC-%Pred-Post 103 %   FEV6FVC-%Change-Post 1 %   FEF 25-75 Pre 0.65 L/sec   FEF2575-%Pred-Pre 33 %   FEF 25-75 Post 0.75 L/sec   FEF2575-%Pred-Post 38 %   FEF2575-%Change-Post 15 %   RV 3.03 L   RV % pred 126 %   TLC 5.17 L   TLC % pred 93 %   DLCO unc 8.83 ml/min/mmHg   DLCO unc % pred 41 %   DL/VA 7.37 ml/min/mmHg/L   DL/VA % pred 64 %      Assessment & Plan:   Problem List Items Addressed This Visit     Atherosclerosis of aorta (HCC)   Chronic anxiety - Primary   Relevant Medications   busPIRone (BUSPAR) 5 MG tablet   Chronic obstructive pulmonary disease (HCC)   Coronary artery disease of native artery of native heart with stable angina pectoris (HCC)   Essential hypertension   Other Visit Diagnoses     Psychophysiological insomnia       Relevant Medications   zolpidem (AMBIEN CR) 6.25 MG CR tablet       Meds ordered this encounter  Medications   busPIRone (BUSPAR) 5 MG tablet    Sig: Take 1 tablet (5 mg total) by mouth 2 (two) times  daily.    Dispense:  180 tablet    Refill:  3    Update quantity   zolpidem (AMBIEN CR) 6.25 MG CR tablet    Sig: Take 1 tablet (6.25 mg total) by mouth at bedtime as needed for sleep.    Dispense:  30 tablet    Refill:  0     Follow up plan: Return in about 5 months (around 09/09/2023) for 5 month Annual Physical AM apt fasting lab AFTER.  Future labs ordered for ***    [name], DO [facility] 04/09/2023, 1:49 PM

## 2023-04-10 ENCOUNTER — Encounter: Payer: Self-pay | Admitting: Family Medicine

## 2023-04-10 DIAGNOSIS — F5104 Psychophysiologic insomnia: Secondary | ICD-10-CM | POA: Insufficient documentation

## 2023-04-10 NOTE — Assessment & Plan Note (Signed)
Controlled HTN on current regimen - Home BP readings stable Complication with Mild-Mod CAD / PAD (carotid) OFF Thiazide, BB Followed by Cardiology  Plan:  1. Continue current BP regimen Amlodipine 10mg  daily, Losartan 100mg , Imdur 60mg  nightly 2. Encourage improved lifestyle - low sodium diet, improve regular exercise 3. Continue monitor BP outside office, bring readings to next visit, if persistently >140/90 or new symptoms notify office sooner

## 2023-04-10 NOTE — Assessment & Plan Note (Signed)
Trial on Ambien CR 6.25mg  due to need for improved sleep maintenance

## 2023-04-10 NOTE — Assessment & Plan Note (Signed)
Identified on imaging CT 12/2022 On Repatha and Aspirin

## 2023-04-10 NOTE — Assessment & Plan Note (Addendum)
Stable COPD without acute flare Former smoker Failed Spiriva, limited result, Breztri  Followed by [employer] On Trelegy now with good results  Rarely using PRN Albuterol

## 2023-04-10 NOTE — Assessment & Plan Note (Signed)
Stable, controlled Without panic or other flare Continues on SSRI nightly, Sertraline Dose increase Buspar Refilled Buspar for 180 pills inc pill count per bottle for up to twice a day dosing.  Has Hydroxyzine 25mg  PRN

## 2023-04-11 ENCOUNTER — Ambulatory Visit: Payer: Medicare HMO | Admitting: Family Medicine

## 2023-04-27 ENCOUNTER — Other Ambulatory Visit: Payer: Self-pay | Admitting: Family Medicine

## 2023-04-27 DIAGNOSIS — J3089 Other allergic rhinitis: Secondary | ICD-10-CM

## 2023-04-29 NOTE — Telephone Encounter (Signed)
Requested by interface surescripts. Future visit in 4 months . Requested Prescriptions  Pending Prescriptions Disp Refills   fluticasone (FLONASE) 50 MCG/ACT nasal spray [Pharmacy Med Name: FLUTICASONE PROPIONATE 50 MCG/ACT N] 48 g 0    Sig: USE 2 SPRAYS INTO EACH NOSTRIL ONCE DAILY     Ear, Nose, and Throat: Nasal Preparations - Corticosteroids Passed - 04/27/2023  1:03 PM      Passed - Valid encounter within last 12 months    Recent Outpatient Visits           2 weeks ago Chronic anxiety   [place] [facility] Smitty Cords, DO   4 months ago Chronic anxiety   [place] [facility] Smitty Cords, DO   7 months ago Chronic anxiety   [place] [facility] Smitty Cords, DO   1 year ago COPD with acute exacerbation [facility])   [place] Smitty Cords, DO   1 year ago Osteoarthritis of glenohumeral joint, right   [place] [facility] [place], Netta Neat, DO       Future Appointments             In 4 months Althea Charon, Netta Neat, DO  [facility], [facility]

## 2023-05-21 ENCOUNTER — Telehealth: Payer: Self-pay

## 2023-05-21 DIAGNOSIS — F5104 Psychophysiologic insomnia: Secondary | ICD-10-CM

## 2023-05-21 MED ORDER — ZOLPIDEM TARTRATE ER 6.25 MG PO TBCR: 6.2500 mg | EXTENDED_RELEASE_TABLET | Freq: Every evening | ORAL | 0 refills | Status: DC | PRN

## 2023-05-21 NOTE — Telephone Encounter (Signed)
Refilled

## 2023-05-21 NOTE — Telephone Encounter (Signed)
Patient walked in and requested a refill on zolpidem. [facility].

## 2023-05-23 ENCOUNTER — Other Ambulatory Visit: Payer: Self-pay | Admitting: Family Medicine

## 2023-05-23 DIAGNOSIS — F419 Anxiety disorder, unspecified: Secondary | ICD-10-CM

## 2023-05-24 NOTE — Telephone Encounter (Signed)
Requested Prescriptions  Pending Prescriptions Disp Refills   sertraline (ZOLOFT) 100 MG tablet [Pharmacy Med Name: SERTRALINE HCL 100 MG TAB] 90 tablet 1    Sig: TAKE 1 TABLET BY MOUTH ONCE DAILY     Psychiatry:  Antidepressants - SSRI - sertraline Passed - 05/23/2023  1:52 PM      Passed - AST in normal range and within 360 days    AST  Date Value Ref Range Status  10/17/2022 21 15 - 41 U/L Final         Passed - ALT in normal range and within 360 days    ALT  Date Value Ref Range Status  02/11/2023 22 0 - 44 U/L Final    Comment:    Performed at [facility], [address], [place], [place] [address]         Passed - Completed PHQ-2 or PHQ-9 in the last 360 days      Passed - Valid encounter within last 6 months    Recent Outpatient Visits           1 month ago Chronic anxiety   [place] [facility] Smitty Cords, DO   5 months ago Chronic anxiety   [facility] Smitty Cords, DO   8 months ago Chronic anxiety   [place] [facility] Smitty Cords, DO   1 year ago COPD with acute exacerbation [facility])   [place] [facility] Smitty Cords, DO   1 year ago Osteoarthritis of glenohumeral joint, right   [place] [facility] [place], Netta Neat, DO       Future Appointments             In 3 months Althea Charon, Netta Neat, DO [place] [facility], [facility]

## 2023-06-12 ENCOUNTER — Other Ambulatory Visit: Payer: Self-pay | Admitting: Family Medicine

## 2023-06-12 DIAGNOSIS — L299 Pruritus, unspecified: Secondary | ICD-10-CM

## 2023-06-13 NOTE — Telephone Encounter (Signed)
Requested Prescriptions  Pending Prescriptions Disp Refills   hydrOXYzine (VISTARIL) 25 MG capsule [Pharmacy Med Name: HYDROXYZINE PAMOATE 25 MG CAP] 90 capsule 3    Sig: TAKE 1 CAPSULE BY MOUTH EVERY 8 HOURS ASNEEDED     Ear, Nose, and Throat:  Antihistamines 2 Failed - 06/12/2023 10:16 AM      Failed - Cr in normal range and within 360 days    Creat  Date Value Ref Range Status  09/06/2021 0.82 0.60 - 1.00 mg/dL Final   Creatinine, Ser  Date Value Ref Range Status  10/17/2022 1.01 (H) 0.44 - 1.00 mg/dL Final         Passed - Valid encounter within last 12 months    Recent Outpatient Visits           2 months ago Chronic anxiety   [place] [facility] Smitty Cords, DO   6 months ago Chronic anxiety   [place] [facility] Smitty Cords, DO   9 months ago Chronic anxiety   [place] [facility] Smitty Cords, DO   1 year ago COPD with acute exacerbation [facility])   [facility] Smitty Cords, DO   1 year ago Osteoarthritis of glenohumeral joint, right   [facility] [place], Netta Neat, DO       Future Appointments             In 3 months Althea Charon, Netta Neat, DO [place] [facility], [facility]

## 2023-06-14 ENCOUNTER — Other Ambulatory Visit: Payer: Self-pay | Admitting: Family Medicine

## 2023-06-14 ENCOUNTER — Other Ambulatory Visit: Payer: Self-pay | Admitting: Internal Medicine

## 2023-06-14 DIAGNOSIS — L299 Pruritus, unspecified: Secondary | ICD-10-CM

## 2023-06-17 NOTE — Telephone Encounter (Signed)
Requested Prescriptions  Pending Prescriptions Disp Refills   hydrOXYzine (VISTARIL) 25 MG capsule [Pharmacy Med Name: HYDROXYZINE PAMOATE 25 MG CAP] 90 capsule 3    Sig: TAKE 1 CAPSULE BY MOUTH EVERY 8 HOURS ASNEEDED     Ear, Nose, and Throat:  Antihistamines 2 Failed - 06/14/2023 12:43 PM      Failed - Cr in normal range and within 360 days    Creat  Date Value Ref Range Status  09/06/2021 0.82 0.60 - 1.00 mg/dL Final   Creatinine, Ser  Date Value Ref Range Status  10/17/2022 1.01 (H) 0.44 - 1.00 mg/dL Final         Passed - Valid encounter within last 12 months    Recent Outpatient Visits           2 months ago Chronic anxiety   [facility] Smitty Cords, DO   6 months ago Chronic anxiety   [place] [facility] Smitty [facility]ords, DO   9 months ago Chronic anxiety   [place] [facility] Smitty Cords, DO   1 year ago COPD with acute exacerbation [facility])   [place] [facility] Smitty Cords, DO   1 year ago Osteoarthritis of glenohumeral joint, right   [place] [facility] [place], Netta Neat, DO       Future Appointments             In 2 months Althea Charon, Netta Neat, DO [place] [facility], [facility]

## 2023-07-03 ENCOUNTER — Other Ambulatory Visit: Payer: Self-pay | Admitting: Family Medicine

## 2023-07-03 DIAGNOSIS — G8929 Other chronic pain: Secondary | ICD-10-CM

## 2023-07-31 ENCOUNTER — Other Ambulatory Visit: Payer: Self-pay | Admitting: Family Medicine

## 2023-07-31 ENCOUNTER — Other Ambulatory Visit: Payer: Self-pay | Admitting: Internal Medicine

## 2023-07-31 DIAGNOSIS — I10 Essential (primary) hypertension: Secondary | ICD-10-CM

## 2023-08-01 NOTE — Telephone Encounter (Signed)
Requested Prescriptions  Pending Prescriptions Disp Refills   losartan (COZAAR) 100 MG tablet [Pharmacy Med Name: LOSARTAN POTASSIUM 100 MG TAB] 90 tablet 0    Sig: TAKE 1 TABLET BY MOUTH ONCE DAILY     Cardiovascular:  Angiotensin Receptor Blockers Failed - 07/31/2023  5:21 PM      Failed - Cr in normal range and within 180 days    Creat  Date Value Ref Range Status  09/06/2021 0.82 0.60 - 1.00 mg/dL Final   Creatinine, Ser  Date Value Ref Range Status  10/17/2022 1.01 (H) 0.44 - 1.00 mg/dL Final         Failed - K in normal range and within 180 days    Potassium  Date Value Ref Range Status  10/17/2022 3.9 3.5 - 5.1 mmol/L Final         Passed - Patient is not pregnant      Passed - Last BP in normal range    BP Readings from Last 1 Encounters:  04/09/23 122/70         Passed - Valid encounter within last 6 months    Recent Outpatient Visits           3 months ago Chronic anxiety   [place] [facility] Smitty Cords, DO   7 months ago Chronic anxiety   [place] [facility] Smitty Cords, DO   10 months ago Chronic anxiety   [place] [facility] Smitty Cords, DO   1 year ago COPD with acute exacerbation [facility])   [facility] Smitty Cords, DO   1 year ago Osteoarthritis of glenohumeral joint, right   [facility] [place], Netta Neat, DO       Future Appointments             In 1 month Althea Charon, Netta Neat, DO [place] [facility], [facility]

## 2023-08-01 NOTE — Telephone Encounter (Signed)
Please advise If ok to refill.

## 2023-08-01 NOTE — Telephone Encounter (Signed)
It is okay to provide a single 30-month supply.  However, if Ms. Coutts is still having GI symptoms necessitating regular famotidine use, she will need to follow-up with her gastroenterologist for further evaluation.  Yvonne Kendall, MD [facility]

## 2023-08-08 ENCOUNTER — Other Ambulatory Visit: Payer: Self-pay | Admitting: Internal Medicine

## 2023-08-08 DIAGNOSIS — F5104 Psychophysiologic insomnia: Secondary | ICD-10-CM

## 2023-08-08 NOTE — Telephone Encounter (Signed)
Requested medication (s) are due for refill today: yes   Requested medication (s) are on the active medication list: yes   Last refill:  05/21/23 #30 0 refills  Future visit scheduled: yes in 1 month   Notes to clinic:  not delegated per protocol. Do you want to refill Rx?     Requested Prescriptions  Pending Prescriptions Disp Refills   zolpidem (AMBIEN CR) 6.25 MG CR tablet [Pharmacy Med Name: ZOLPIDEM TARTRATE ER 6.25 MG TAB] 30 tablet     Sig: TAKE 1 TABLET BY MOUTH AT BEDTIME AS NEEDED FOR SLEEP     Not Delegated - Psychiatry:  Anxiolytics/Hypnotics Failed - 08/08/2023  8:41 AM      Failed - This refill cannot be delegated      Failed - Urine Drug Screen completed in last 360 days      Passed - Valid encounter within last 6 months    Recent Outpatient Visits           4 months ago Chronic anxiety   [place] Smitty Cords, DO   8 months ago Chronic anxiety   [facility] Smitty [place]ords, [place]   11 months ago Chronic anxiety   [place] [facility] Smitty Cords, DO   1 year ago COPD with acute exacerbation [facility])   [place] [facility] Smitty Cords, DO   1 year ago Osteoarthritis of glenohumeral joint, right   [place] [place], Netta Neat, DO       Future Appointments             In 1 month Althea Charon, Netta Neat, DO  [facility], [facility]

## 2023-09-02 ENCOUNTER — Other Ambulatory Visit: Payer: Self-pay | Admitting: Family Medicine

## 2023-09-02 ENCOUNTER — Other Ambulatory Visit: Payer: Self-pay | Admitting: Internal Medicine

## 2023-09-02 ENCOUNTER — Other Ambulatory Visit: Payer: Medicare HMO

## 2023-09-02 DIAGNOSIS — E785 Hyperlipidemia, unspecified: Secondary | ICD-10-CM

## 2023-09-02 DIAGNOSIS — I10 Essential (primary) hypertension: Secondary | ICD-10-CM

## 2023-09-02 DIAGNOSIS — I25118 Atherosclerotic heart disease of native coronary artery with other forms of angina pectoris: Secondary | ICD-10-CM

## 2023-09-03 NOTE — Telephone Encounter (Signed)
Requested Prescriptions  Pending Prescriptions Disp Refills   amLODipine (NORVASC) 10 MG tablet [Pharmacy Med Name: AMLODIPINE BESYLATE 10 MG TAB] 60 tablet 0    Sig: TAKE 1 TABLET BY MOUTH ONCE DAILY     Cardiovascular: Calcium Channel Blockers 2 Passed - 09/02/2023  6:05 PM      Passed - Last BP in normal range    BP Readings from Last 1 Encounters:  04/09/23 122/70         Passed - Last Heart Rate in normal range    Pulse Readings from Last 1 Encounters:  04/09/23 80         Passed - Valid encounter within last 6 months    Recent Outpatient Visits           4 months ago Chronic anxiety   [place] [facility] Smitty Cords, DO   8 months ago Chronic anxiety   [place] [facility] Smitty Cords, [place]   11 months ago Chronic anxiety   [place] Smitty Cords, DO   1 year ago COPD with acute exacerbation [facility])   [place] [facility] Smitty Cords, DO   1 year ago Osteoarthritis of glenohumeral joint, right   [place] [facility] [place], Netta Neat, DO       Future Appointments             In 1 week Althea Charon, Netta Neat, DO [place] [facility], [facility]

## 2023-09-10 ENCOUNTER — Other Ambulatory Visit: Payer: Self-pay | Admitting: Family Medicine

## 2023-09-10 ENCOUNTER — Other Ambulatory Visit: Payer: Self-pay | Admitting: Internal Medicine

## 2023-09-10 ENCOUNTER — Encounter: Payer: Medicare HMO | Admitting: Family Medicine

## 2023-09-10 DIAGNOSIS — L299 Pruritus, unspecified: Secondary | ICD-10-CM

## 2023-09-10 NOTE — Telephone Encounter (Signed)
Left voice mail to schedule appt

## 2023-09-10 NOTE — Telephone Encounter (Signed)
Patient needs a follow up appt made.

## 2023-09-11 ENCOUNTER — Encounter: Payer: Self-pay | Admitting: Family Medicine

## 2023-09-11 ENCOUNTER — Ambulatory Visit
Admission: RE | Admit: 2023-09-11 | Discharge: 2023-09-11 | Disposition: A | Discharge status: Home / Self Care | Payer: Medicare HMO | Source: Ambulatory Visit | Attending: Family Medicine | Admitting: Family Medicine

## 2023-09-11 ENCOUNTER — Ambulatory Visit (INDEPENDENT_AMBULATORY_CARE_PROVIDER_SITE_OTHER): Payer: Medicare HMO | Admitting: Family Medicine

## 2023-09-11 VITALS — BP 124/72 | HR 62 | Ht 67.0 in | Wt 172.0 lb

## 2023-09-11 DIAGNOSIS — H8110 Benign paroxysmal vertigo, unspecified ear: Secondary | ICD-10-CM

## 2023-09-11 DIAGNOSIS — G44311 Acute post-traumatic headache, intractable: Secondary | ICD-10-CM | POA: Insufficient documentation

## 2023-09-11 DIAGNOSIS — R7309 Other abnormal glucose: Secondary | ICD-10-CM | POA: Diagnosis not present

## 2023-09-11 DIAGNOSIS — F419 Anxiety disorder, unspecified: Secondary | ICD-10-CM | POA: Diagnosis not present

## 2023-09-11 DIAGNOSIS — I25118 Atherosclerotic heart disease of native coronary artery with other forms of angina pectoris: Secondary | ICD-10-CM

## 2023-09-11 DIAGNOSIS — E782 Mixed hyperlipidemia: Secondary | ICD-10-CM

## 2023-09-11 DIAGNOSIS — S0990XA Unspecified injury of head, initial encounter: Secondary | ICD-10-CM | POA: Diagnosis not present

## 2023-09-11 DIAGNOSIS — E559 Vitamin D deficiency, unspecified: Secondary | ICD-10-CM | POA: Diagnosis not present

## 2023-09-11 DIAGNOSIS — Z0000 Encounter for general adult medical examination without abnormal findings: Secondary | ICD-10-CM | POA: Diagnosis not present

## 2023-09-11 DIAGNOSIS — F5104 Psychophysiologic insomnia: Secondary | ICD-10-CM

## 2023-09-11 DIAGNOSIS — I700 Atherosclerosis of aorta: Secondary | ICD-10-CM | POA: Diagnosis not present

## 2023-09-11 DIAGNOSIS — R519 Headache, unspecified: Secondary | ICD-10-CM | POA: Diagnosis not present

## 2023-09-11 DIAGNOSIS — J432 Centrilobular emphysema: Secondary | ICD-10-CM

## 2023-09-11 DIAGNOSIS — R42 Dizziness and giddiness: Secondary | ICD-10-CM | POA: Diagnosis not present

## 2023-09-11 DIAGNOSIS — I10 Essential (primary) hypertension: Secondary | ICD-10-CM

## 2023-09-11 MED ORDER — ZOLPIDEM TARTRATE ER 6.25 MG PO TBCR: 6.2500 mg | EXTENDED_RELEASE_TABLET | Freq: Every evening | ORAL | 5 refills | Status: AC | PRN

## 2023-09-11 NOTE — Assessment & Plan Note (Signed)
Identified on imaging CT 12/2022 On Repatha and Aspirin

## 2023-09-11 NOTE — Assessment & Plan Note (Signed)
Stable, mild to moderate non obstructive CAD Followed by Cardiology Continue med management now on imdur, ASA, statin Off BB

## 2023-09-11 NOTE — Telephone Encounter (Signed)
Request is too soon, last refill 06/17/23 for 90 and 3 refills.  Requested Prescriptions  Pending Prescriptions Disp Refills   hydrOXYzine (VISTARIL) 25 MG capsule [Pharmacy Med Name: HYDROXYZINE PAMOATE 25 MG CAP] 90 capsule 3    Sig: TAKE 1 CAPSULE BY MOUTH EVERY 8 HOURS ASNEEDED     Ear, Nose, and Throat:  Antihistamines 2 Failed - 09/10/2023 11:53 AM      Failed - Cr in normal range and within 360 days    Creat  Date Value Ref Range Status  09/06/2021 0.82 0.60 - 1.00 mg/dL Final   Creatinine, Ser  Date Value Ref Range Status  10/17/2022 1.01 (H) 0.44 - 1.00 mg/dL Final         Passed - Valid encounter within last 12 months    Recent Outpatient Visits           Today Annual physical exam   [place] [facility] Smitty Cords, DO   5 months ago Chronic anxiety   [place] [facility] Smitty Cords, DO   9 months ago Chronic anxiety   [place] [facility] Smitty Cords, DO   1 year ago Chronic anxiety   [place] Smitty Cords, DO   1 year ago COPD with acute exacerbation [facility])   [place] [facility] [place], Netta Neat, [place]

## 2023-09-11 NOTE — Assessment & Plan Note (Signed)
Due for A1c  Plan:  1. Not on any therapy currently  2. Encourage improved lifestyle - low carb, low sugar diet, reduce portion size, continue improving regular exercise

## 2023-09-11 NOTE — Progress Notes (Signed)
Subjective:    Patient ID: Kristin Parker, female    DOB: [birth year], 72 y.o.   MRN: [id number]  Kristin Parker is a 72 y.o. female presenting on 09/11/2023 for Annual Exam   HPI  Here for Annual Physical and Lab Orders.  Headache, post traumatic Positional Vertigo She had initial injury with fall from step stool 4 weeks ago and hit her head. She had a knot and swollen spot initially but that has improved.  Postural symptoms feeling lightheaded dizzy vertigo with movement of head from looking forward to down.  Elevated A1c Due for lab today Prior 5.6 to 5.7 range  Meds: Never on med Currently on ARB Lifestyle: - Diet (improved diet. She does admit inc thirst and dry mouth from meds, drinking sports drink propel and vitamin water) - Exercise (limited) Denies hypoglycemia   CHRONIC HTN: Reports no concerns Current Meds - Losartan 100mg  daily, Amlodipine 10mg  daily Reports good compliance, took meds today. Tolerating well, w/o complaints. Denies CP, dyspnea, HA, edema, dizziness / lightheadedness  CAD Followed by Cardiology, she will need to schedule new appointment soon. On medication management. On ASA 81   Hyperlipidemia Followed by [facility], [place] Failed multiple meds in past including statins intermittent statin, rosuvastatin, recently zetia. Had myalgia. History of myalgia on statin, with drug induced myopathy could not tolerate She is doing great on Repatha SureClick, now LDL down from >200 down to goal 70s Due for lipid panel today   COPD / Tobacco Abuse Former smoker now Doing well smoke free   Centrilobular Emphysema Followed by [facility] Tried various inhalers, failed Breztri. Now on Trelegy, she has not done as well with the dry powder, feels dry mouth Prior Pulmonary Rehab program for breathing.   Anxiety Since last visit, started on Buspar 5mg  THREE TIMES A DAY AS NEEDED. She is taking it regularly twice a day morning and  evening and doing well. Continues on Sertraline 100mg  daily with good results. Continue on Hydroxyzine 25mg  capsule nightly for itch and anxiety and sleep   Insomnia Difficulty with falling asleep Failed Trazodone, Melatonin, Sertraline, Buspar On Ambien CR 6.25mg  nightly most nights. She ran out needed 30 day order. Asking for refills It works well.  FOLLOW-UP Chronic R shoulder pain Prior history subacromial injection with improvement. Due for repeat now, she declines orthopedic Limited with range of motion above head Wants to delay surgery    S/p cholecystectomy - robotic surgery Done by Dr Aleen Campi ASA      Health Maintenance:   Declines Mammo and DEXA   Colonoscopy completed 05/15/21 by Dr Wyline Mood, had 11-12 polyps removed, Last colonoscopy 06/2022, repeat 3 years. 2026   Has followed with OBGYN Doreene Burke CNM, she ordered Mammogram 02/2021.     02/07/2023    3:04 PM 01/05/2022   10:29 AM 09/11/2021    2:19 PM  Depression screen PHQ 2/9  Decreased Interest 0 0 0  Down, Depressed, Hopeless 1 0 0  PHQ - 2 Score 1 0 0  Altered sleeping 1  1  Tired, decreased energy 1  0  Change in appetite 0  0  Feeling bad or failure about yourself  0  0  Trouble concentrating 1  0  Moving slowly or fidgety/restless 0  0  Suicidal thoughts 0  0  PHQ-9 Score 4  1  Difficult doing work/chores Not difficult at all  Somewhat difficult    Past Medical History:  Diagnosis Date  Anxiety    Aortic valve sclerosis    Bilateral carotid bruits    Carotid artery stenosis    COPD (chronic obstructive pulmonary disease) (HCC)    Hypertension    Mitral regurgitation    Mixed hyperlipidemia    Shoulder pain    Sleeping difficulties    Past Surgical History:  Procedure Laterality Date   BREAST BIOPSY Left 08/13/2018   affirm bx, x clip, path pending   CARDIAC CATHETERIZATION     CATARACT EXTRACTION Left    2017   CHOLECYSTECTOMY     COLONOSCOPY  2012   Dr Bluford Kaufmann   COLONOSCOPY  WITH PROPOFOL N/A 05/15/2021   Procedure: COLONOSCOPY WITH PROPOFOL;  Surgeon: Wyline Mood, MD;  Location: [facility] ENDOSCOPY;  Service: Gastroenterology;  Laterality: N/A;   COLONOSCOPY WITH PROPOFOL N/A 07/03/2022   Procedure: COLONOSCOPY WITH PROPOFOL;  Surgeon: Wyline Mood, MD;  Location: [facility] ENDOSCOPY;  Service: Gastroenterology;  Laterality: N/A;   EYE SURGERY     LEFT HEART CATH AND CORONARY ANGIOGRAPHY N/A 06/25/2017   Procedure: LEFT HEART CATH AND CORONARY ANGIOGRAPHY;  Surgeon: Yvonne Kendall, MD;  Location: [facility] INVASIVE CV LAB;  Service: Cardiovascular;  Laterality: N/A;   Social History   Socioeconomic History   Marital status: Divorced    Spouse name: Not on file   Number of children: Not on file   Years of education: Not on file   Highest education level: GED or equivalent  Occupational History   Occupation: retired   Tobacco Use   Smoking status: Former    Current packs/day: 0.00    Average packs/day: 0.5 packs/day for 30.0 years (15.0 ttl pk-yrs)    Types: Cigarettes    Start date: 10/30/1990    Quit date: 10/30/2020    Years since quitting: 2.8   Smokeless tobacco: Never   Tobacco comments:    Quit Jan 2022  Vaping Use   Vaping status: Former  Substance and Sexual Activity   Alcohol use: No    Alcohol/week: 0.0 standard drinks of alcohol   Drug use: No   Sexual activity: Not on file  Other Topics Concern   Not on file  Social History Narrative   Not on file   Social Determinants of Health   Financial Resource Strain: Low Risk  (02/07/2023)   Overall Financial Resource Strain (CARDIA)    Difficulty of Paying Living Expenses: Not hard at all  Food Insecurity: No Food Insecurity (02/07/2023)   Hunger Vital Sign    Worried About Running Out of Food in the Last Year: Never true    Ran Out of Food in the Last Year: Never true  Transportation Needs: No Transportation Needs (02/07/2023)   PRAPARE - Administrator, Civil Service (Medical): No    Lack of  Transportation (Non-Medical): No  Physical Activity: Insufficiently Active (02/07/2023)   Exercise Vital Sign    Days of Exercise per Week: 3 days    Minutes of Exercise per Session: 30 min  Stress: No Stress Concern Present (02/07/2023)   [employer] of Occupational Health - Occupational Stress Questionnaire    Feeling of Stress : Not at all  Social Connections: Socially Isolated (02/07/2023)   Social Connection and Isolation Panel [NHANES]    Frequency of Communication with Friends and Family: More than three times a week    Frequency of Social Gatherings with Friends and Family: More than three times a week    Attends Religious Services: Never    Active  Member of Clubs or Organizations: No    Attends Banker Meetings: Never    Marital Status: Divorced  Catering manager Violence: Not At Risk (02/07/2023)   Humiliation, Afraid, Rape, and Kick questionnaire    Fear of Current or Ex-Partner: No    Emotionally Abused: No    Physically Abused: No    Sexually Abused: No   Family History  Problem Relation Age of Onset   Cancer Mother        bladder cancer   Heart disease Mother 86       Pacemaker   Cancer Father        lung   Multiple sclerosis Sister    Valvular heart disease Brother 66       s/p bioprosthetic valve replacement at [facility]   Breast cancer Neg Hx    Current Outpatient Medications on File Prior to Visit  Medication Sig   acetaminophen (TYLENOL) 500 MG tablet Take 2 tablets (1,000 mg total) by mouth every 6 (six) hours as needed for mild pain.   acetic acid-hydrocortisone (VOSOL-HC) OTIC solution Place 3 drops into both ears 3 (three) times daily. May use wick for first 24 hours   albuterol (VENTOLIN HFA) 108 (90 Base) MCG/ACT inhaler INHALE 2 PUFFS EVERY 4 HOURS FOR WHEEZING OR SHORTNESS OF BREATH AS NEEDED   amLODipine (NORVASC) 10 MG tablet TAKE 1 TABLET BY MOUTH ONCE DAILY   aspirin EC 81 MG tablet Take 1 tablet (81 mg total) by mouth daily.    busPIRone (BUSPAR) 5 MG tablet Take 1 tablet (5 mg total) by mouth 2 (two) times daily.   Evolocumab (REPATHA SURECLICK) 140 MG/ML SOAJ INJECT 1 DOSE INTO THE SKIN EVERY 14 DAYS   famotidine (PEPCID) 20 MG tablet TAKE 1 TABLET BY MOUTH TWICE DAILY   fluticasone (FLONASE) 50 MCG/ACT nasal spray USE 2 SPRAYS INTO EACH NOSTRIL ONCE DAILY   Fluticasone-Umeclidin-Vilant (TRELEGY ELLIPTA) 200-62.5-25 MCG/ACT AEPB Inhale 1 Inhalation into the lungs daily.   hydrOXYzine (VISTARIL) 25 MG capsule TAKE 1 CAPSULE BY MOUTH EVERY 8 HOURS ASNEEDED   icosapent Ethyl (VASCEPA) 1 g capsule Take 2 capsules (2 g total) by mouth 2 (two) times daily.   isosorbide mononitrate (IMDUR) 60 MG 24 hr tablet Take 1 tablet (60 mg total) by mouth daily. PLEASE CALL OFFICE TO SCHEDULE APPOINTMENT PRIOR TO NEXT REFILL   losartan (COZAAR) 100 MG tablet TAKE 1 TABLET BY MOUTH ONCE DAILY   meloxicam (MOBIC) 15 MG tablet TAKE 1 TABLET BY MOUTH ONCE DAILY AS NEEDED FOR PAIN WITH FOOD   sertraline (ZOLOFT) 100 MG tablet TAKE 1 TABLET BY MOUTH ONCE DAILY   No current facility-administered medications on file prior to visit.    Review of Systems  Constitutional:  Negative for activity change, appetite change, chills, diaphoresis, fatigue and fever.  HENT:  Negative for congestion and hearing loss.   Eyes:  Negative for visual disturbance.  Respiratory:  Negative for cough, chest tightness, shortness of breath and wheezing.   Cardiovascular:  Negative for chest pain, palpitations and leg swelling.  Gastrointestinal:  Negative for abdominal pain, constipation, diarrhea, nausea and vomiting.  Genitourinary:  Negative for dysuria, frequency and hematuria.  Musculoskeletal:  Negative for arthralgias and neck pain.  Skin:  Negative for rash.  Neurological:  Positive for dizziness and headaches. Negative for weakness, light-headedness and numbness.  Hematological:  Negative for adenopathy.  Psychiatric/Behavioral:  Negative for  behavioral problems, dysphoric mood and sleep disturbance.    Per  HPI unless specifically indicated above      Objective:    BP 124/72   Pulse 62   Ht 5\' 7"  (1.702 m)   Wt 172 lb (78 kg)   SpO2 92%   BMI 26.94 kg/m   Wt Readings from Last 3 Encounters:  09/11/23 172 lb (78 kg)  04/09/23 171 lb (77.6 kg)  04/03/23 173 lb 3.2 oz (78.6 kg)    Physical Exam Vitals and nursing note reviewed.  Constitutional:      General: She is not in acute distress.    Appearance: She is well-developed. She is not diaphoretic.     Comments: Well-appearing, comfortable, cooperative  HENT:     Head: Normocephalic and atraumatic.     Comments: No significant deformity identified on scalp today Eyes:     General:        Right eye: No discharge.        Left eye: No discharge.     Conjunctiva/sclera: Conjunctivae normal.     Pupils: Pupils are equal, round, and reactive to light.  Neck:     Thyroid: No thyromegaly.  Cardiovascular:     Rate and Rhythm: Normal rate and regular rhythm.     Pulses: Normal pulses.     Heart sounds: Normal heart sounds. No murmur heard. Pulmonary:     Effort: Pulmonary effort is normal. No respiratory distress.     Breath sounds: Normal breath sounds. No wheezing or rales.  Abdominal:     General: Bowel sounds are normal. There is no distension.     Palpations: Abdomen is soft. There is no mass.     Tenderness: There is no abdominal tenderness.  Musculoskeletal:        General: No tenderness. Normal range of motion.     Cervical back: Normal range of motion and neck supple.     Right lower leg: No edema.     Left lower leg: No edema.     Comments: Upper / Lower Extremities: - Normal muscle tone, strength bilateral upper extremities 5/5, lower extremities 5/5  Lymphadenopathy:     Cervical: No cervical adenopathy.  Skin:    General: Skin is warm and dry.     Findings: No erythema or rash.  Neurological:     Mental Status: She is alert and oriented to  person, place, and time.     Comments: Distal sensation intact to light touch all extremities  Psychiatric:        Mood and Affect: Mood normal.        Behavior: Behavior normal.        Thought Content: Thought content normal.     Comments: Well groomed, good eye contact, normal speech and thoughts    I have personally reviewed the radiology report from 09/11/23 on CT Head WO Contrast.  CLINICAL DATA:  Head trauma, minor (Age >= 65y) headache, vertigo following fall injury.   EXAM: CT HEAD WITHOUT CONTRAST   TECHNIQUE: Contiguous axial images were obtained from the base of the skull through the vertex without intravenous contrast.   RADIATION DOSE REDUCTION: This exam was performed according to the departmental dose-optimization program which includes automated exposure control, adjustment of the mA and/or kV according to patient size and/or use of iterative reconstruction technique.   COMPARISON:  None Available.   FINDINGS: Brain: No acute hemorrhage. Cortical gray-white differentiation is preserved. Patchy hypoattenuation of the periventricular white matter, most consistent with mild chronic small-vessel disease. Prominence of the ventricles  and sulci within normal limits for age. No extra-axial collection. Basilar cisterns are patent.   Vascular: No hyperdense vessel or unexpected calcification.   Skull: No calvarial fracture or suspicious bone lesion. Skull base is unremarkable.   Sinuses/Orbits: No acute finding.   Other: None.   IMPRESSION: 1. No acute intracranial abnormality. 2. Mild chronic small-vessel disease.     Electronically Signed   By: Orvan Falconer M.D.   On: 09/11/2023 09:23  Results for orders placed or performed in visit on 03/26/23  Pulmonary Function Test [facility] Only  Result Value Ref Range   FVC-Pre 2.12 L   FVC-%Pred-Pre 64 %   FVC-Post 2.18 L   FVC-%Pred-Post 66 %   FVC-%Change-Post 2 %   FEV1-Pre 1.32 L   FEV1-%Pred-Pre 53 %    FEV1-Post 1.41 L   FEV1-%Pred-Post 57 %   FEV1-%Change-Post 6 %   FEV6-Pre 2.08 L   FEV6-%Pred-Pre 66 %   FEV6-Post 2.16 L   FEV6-%Pred-Post 69 %   FEV6-%Change-Post 3 %   Pre FEV1/FVC ratio 62 %   FEV1FVC-%Pred-Pre 82 %   Post FEV1/FVC ratio 65 %   FEV1FVC-%Change-Post 3 %   Pre FEV6/FVC Ratio 98 %   FEV6FVC-%Pred-Pre 102 %   Post FEV6/FVC ratio 99 %   FEV6FVC-%Pred-Post 103 %   FEV6FVC-%Change-Post 1 %   FEF 25-75 Pre 0.65 L/sec   FEF2575-%Pred-Pre 33 %   FEF 25-75 Post 0.75 L/sec   FEF2575-%Pred-Post 38 %   FEF2575-%Change-Post 15 %   RV 3.03 L   RV % pred 126 %   TLC 5.17 L   TLC % pred 93 %   DLCO unc 8.83 ml/min/mmHg   DLCO unc % pred 41 %   DL/VA 0.63 ml/min/mmHg/L   DL/VA % pred 64 %      Assessment & Plan:   Problem List Items Addressed This Visit     Atherosclerosis of aorta (HCC)    Identified on imaging CT 12/2022 On Repatha and Aspirin      Relevant Orders   Lipid panel   Centrilobular emphysema (HCC)    Stable COPD without acute flare Former smoker Failed Spiriva, limited result, Breztri side effect  Followed by Darrol Poke, she will re-schedule next apt. On Trelegy now with good results but difficulty with dry mouth and irritation from powder. Asking about other inhalers  Rarely using PRN Albuterol      Relevant Orders   COMPLETE METABOLIC PANEL WITH GFR   Chronic anxiety    Stable, controlled Without panic or other flare Continues on SSRI nightly, Sertraline, Buspar Has Hydroxyzine 25mg  PRN      Coronary artery disease of native artery of native heart with stable angina pectoris (HCC)    Stable, mild to moderate non obstructive CAD Followed by Cardiology Continue med management now on imdur, ASA, statin Off BB      Relevant Orders   Lipid panel   COMPLETE METABOLIC PANEL WITH GFR   Elevated hemoglobin A1c    Due for A1c  Plan:  1. Not on any therapy currently  2. Encourage improved lifestyle - low carb, low sugar diet,  reduce portion size, continue improving regular exercise       Relevant Orders   Hemoglobin A1c   Essential hypertension    Controlled HTN on current regimen - Home BP readings stable Complication with Mild-Mod CAD / PAD (carotid) OFF Thiazide, BB Followed by Cardiology  Plan:  1. Continue current BP regimen Amlodipine 10mg   daily, Losartan 100mg , Imdur 60mg  nightly 2. Encourage improved lifestyle - low sodium diet, improve regular exercise 3. Continue monitor BP outside office, bring readings to next visit, if persistently >140/90 or new symptoms notify office sooner      Relevant Orders   COMPLETE METABOLIC PANEL WITH GFR   CBC with Differential/Platelet   Psychophysiological insomnia    Improved on medication Continue Ambien CR 6.25mg  due to need for improved sleep maintenance - add refills       Relevant Medications   zolpidem (AMBIEN CR) 6.25 MG CR tablet   Other Visit Diagnoses     Annual physical exam    -  Primary   Relevant Orders   Hemoglobin A1c   Lipid panel   COMPLETE METABOLIC PANEL WITH GFR   CBC with Differential/Platelet   TSH   Mixed hyperlipidemia       Relevant Orders   Lipid panel   COMPLETE METABOLIC PANEL WITH GFR   Intractable acute post-traumatic headache       Relevant Orders   CT HEAD WO CONTRAST ( ) (Completed)   Vitamin D deficiency       Relevant Orders   VITAMIN D 25 Hydroxy (Vit-D Deficiency, Fractures)   Benign paroxysmal positional vertigo, unspecified laterality           Updated Health Maintenance information Fasting lab orders today Encouraged improvement to lifestyle with diet and exercise Goal maintain healthy weight   Head Trauma Recent history of two falls with head impact, resulting in persistent headaches and vertigo. No neurological deficits reported. -Order head CT scan to rule out internal injury.  UPDATE **09/11/23 1pm** Reviewed results of STAT Head CT. Negative. No acute abnormality. Result note entered  to chart for patient to be contacted. Reassurance to be provided at this time. Suspect may be more contusion from fall and may take time for soreness to resolve.  Vertigo Reports of dizziness and feeling unwell when changing head position. Possible benign paroxysmal positional vertigo (BPPV). -Provide home exercise regimen for vertigo management.  Muscle Cramps Reports of frequent muscle cramps in legs and arms. -Advise to maintain hydration and electrolyte balance.  Dry Mouth Reports of persistent dry mouth, possibly medication-induced. -Continue current medications and monitor symptoms.  General Health Maintenance -Declined mammogram;b based on age, she declines further mammograms. -Colonoscopy due in 2026 for repeat.  Medication Management -Refill Ambien prescription for 30 days with additional refills for up to six months.  Follow-up -Continue care with pulmonologist for respiratory concerns.     Orders Placed This Encounter  Procedures   CT HEAD WO CONTRAST ( )    Standing Status:   Future    Number of Occurrences:   1    Standing Expiration Date:   09/10/2024    Order Specific Question:   Preferred imaging location?    Answer:   [facility]    Order Specific Question:   Release to patient    Answer:   Immediate    Order Specific Question:   Call Results- Best Contact Number?    Answer:   [phone] do not hold    Order Specific Question:   Release to patient    Answer:   Immediate [1]   Hemoglobin A1c   Lipid panel    Order Specific Question:   Has the patient fasted?    Answer:   Yes   COMPLETE METABOLIC PANEL WITH GFR   CBC with Differential/Platelet   TSH   VITAMIN D 25 Hydroxy (  Vit-D Deficiency, Fractures)      Meds ordered this encounter  Medications   zolpidem (AMBIEN CR) 6.25 MG CR tablet    Sig: Take 1 tablet (6.25 mg total) by mouth at bedtime as needed. for sleep    Dispense:  30 tablet    Refill:  5      Follow up plan: No  follow-ups on file.  [name], DO [facility] 09/11/2023, 8:46 AM

## 2023-09-11 NOTE — Assessment & Plan Note (Signed)
Improved on medication Continue Ambien CR 6.25mg  due to need for improved sleep maintenance - add refills

## 2023-09-11 NOTE — Assessment & Plan Note (Signed)
Stable, controlled Without panic or other flare Continues on SSRI nightly, Sertraline, Buspar Has Hydroxyzine 25mg  PRN

## 2023-09-11 NOTE — Assessment & Plan Note (Signed)
Controlled HTN on current regimen - Home BP readings stable Complication with Mild-Mod CAD / PAD (carotid) OFF Thiazide, BB Followed by Cardiology  Plan:  1. Continue current BP regimen Amlodipine 10mg  daily, Losartan 100mg , Imdur 60mg  nightly 2. Encourage improved lifestyle - low sodium diet, improve regular exercise 3. Continue monitor BP outside office, bring readings to next visit, if persistently >140/90 or new symptoms notify office sooner

## 2023-09-11 NOTE — Assessment & Plan Note (Signed)
Stable COPD without acute flare Former smoker Failed Spiriva, limited result, Breztri side effect  Followed by Darrol Poke, she will re-schedule next apt. On Trelegy now with good results but difficulty with dry mouth and irritation from powder. Asking about other inhalers  Rarely using PRN Albuterol

## 2023-09-11 NOTE — Patient Instructions (Addendum)
Thank you for coming to the office today.  We have ordered STAT Head CT imaging for you  Hopefully results later today  [facility] at [facility] Address: [address] [place], [place] [address] Phone: [phone]   1. You have symptoms of Vertigo (Benign Paroxysmal Positional Vertigo) - This is commonly caused by inner ear fluid imbalance, sometimes can be worsened by allergies and sinus symptoms, otherwise it can occur randomly sometimes and we may never discover the exact cause. - To treat this, try the Epley Manuever (see diagrams/instructions below) at home up to 3 times a day for 1-2 weeks or until symptoms resolve - You may take Meclizine as needed up to 3 times a day for dizziness, this will not cure symptoms but may help. Caution may make you drowsy.  If you develop significant worsening episode with vertigo that does not improve and you get severe headache, loss of vision, arm or leg weakness, slurred speech, or other concerning symptoms please seek immediate medical attention at Emergency Department.  Please schedule a follow-up appointment with Dr Althea Charon within 4 weeks if Vertigo not improving, and will consider Referral to Vestibular Rehab  See the next page for images describing the Epley Manuever.     ----------------------------------------------------------------------------------------------------------------------        Please schedule a Follow-up Appointment to: Return if symptoms worsen or fail to improve.  If you have any other questions or concerns, please feel free to call the office or send a message through MyChart. You may also schedule an earlier appointment if necessary.  Additionally, you may be receiving a survey about your experience at our office within a few days to 1 week by e-mail or mail. We value your feedback.  [name], DO [facility], [place]

## 2023-09-12 ENCOUNTER — Telehealth: Payer: Self-pay | Admitting: Internal Medicine

## 2023-09-12 ENCOUNTER — Other Ambulatory Visit: Payer: Self-pay | Admitting: Internal Medicine

## 2023-09-12 LAB — COMPLETE METABOLIC PANEL WITH GFR
AG Ratio: 1.8 (calc) (ref 1.0–2.5)
ALT: 18 U/L (ref 6–29)
AST: 16 U/L (ref 10–35)
Albumin: 4.5 g/dL (ref 3.6–5.1)
Alkaline phosphatase (APISO): 97 U/L (ref 37–153)
BUN/Creatinine Ratio: 17 (calc) (ref 6–22)
BUN: 18 mg/dL (ref 7–25)
CO2: 27 mmol/L (ref 20–32)
Calcium: 9.7 mg/dL (ref 8.6–10.4)
Chloride: 105 mmol/L (ref 98–110)
Creat: 1.03 mg/dL — ABNORMAL HIGH (ref 0.60–1.00)
Globulin: 2.5 g/dL (ref 1.9–3.7)
Glucose, Bld: 107 mg/dL — ABNORMAL HIGH (ref 65–99)
Potassium: 4.4 mmol/L (ref 3.5–5.3)
Sodium: 140 mmol/L (ref 135–146)
Total Bilirubin: 0.4 mg/dL (ref 0.2–1.2)
Total Protein: 7 g/dL (ref 6.1–8.1)
eGFR: 57 mL/min/{1.73_m2} — ABNORMAL LOW (ref >=60)

## 2023-09-12 LAB — LIPID PANEL
Cholesterol: 237 mg/dL — ABNORMAL HIGH (ref <200)
HDL: 38 mg/dL — ABNORMAL LOW (ref >=50)
LDL Cholesterol (Calc): 154 mg/dL — ABNORMAL HIGH
Non-HDL Cholesterol (Calc): 199 mg/dL — ABNORMAL HIGH (ref <130)
Total CHOL/HDL Ratio: 6.2 (calc) — ABNORMAL HIGH (ref <5.0)
Triglycerides: 272 mg/dL — ABNORMAL HIGH (ref <150)

## 2023-09-12 LAB — CBC WITH DIFFERENTIAL/PLATELET
Absolute Lymphocytes: 2999 {cells}/uL (ref 850–3900)
Absolute Monocytes: 790 {cells}/uL (ref 200–950)
Basophils Absolute: 85 {cells}/uL (ref 0–200)
Basophils Relative: 0.9 %
Eosinophils Absolute: 216 {cells}/uL (ref 15–500)
Eosinophils Relative: 2.3 %
HCT: 45.1 % — ABNORMAL HIGH (ref 35.0–45.0)
Hemoglobin: 15.2 g/dL (ref 11.7–15.5)
MCH: 30.4 pg (ref 27.0–33.0)
MCHC: 33.7 g/dL (ref 32.0–36.0)
MCV: 90.2 fL (ref 80.0–100.0)
MPV: 10.2 fL (ref 7.5–12.5)
Monocytes Relative: 8.4 %
Neutro Abs: 5311 {cells}/uL (ref 1500–7800)
Neutrophils Relative %: 56.5 %
Platelets: 312 10*3/uL (ref 140–400)
RBC: 5 10*6/uL (ref 3.80–5.10)
RDW: 13.2 % (ref 11.0–15.0)
Total Lymphocyte: 31.9 %
WBC: 9.4 10*3/uL (ref 3.8–10.8)

## 2023-09-12 LAB — HEMOGLOBIN A1C
Hgb A1c MFr Bld: 5.8 %{Hb} — ABNORMAL HIGH (ref <5.7)
Mean Plasma Glucose: 120 mg/dL
eAG (mmol/L): 6.6 mmol/L

## 2023-09-12 LAB — TSH: TSH: 2.4 m[IU]/L (ref 0.40–4.50)

## 2023-09-12 LAB — VITAMIN D 25 HYDROXY (VIT D DEFICIENCY, FRACTURES): Vit D, 25-Hydroxy: 24 ng/mL — ABNORMAL LOW (ref 30–100)

## 2023-09-12 MED ORDER — VITAMIN D (ERGOCALCIFEROL) 1.25 MG (50000 UNIT) PO CAPS: 50000.0000 [IU] | ORAL_CAPSULE | ORAL | 0 refills | Status: DC

## 2023-09-12 NOTE — Progress Notes (Signed)
(  KeyEllen Henri) PA Case ID #: [id number] Rx #: [id number] Need Help? Call us at [phone] Status sent iconSent to Plan today Drug Zolpidem Tartrate ER 6.25MG  er tablets ePA cloud logo Form [facility] Electronic PA Form Original Claim Info 569,MR Rufina Falco [id number]

## 2023-09-12 NOTE — Telephone Encounter (Signed)
Patient c/o Palpitations:  High priority if patient c/o lightheadedness, shortness of breath, or chest pain  How long have you had palpitations/irregular HR/ Afib? Are you having the symptoms now?  Palpitations for the past 7-8 days. No symptoms currently. Patient assumes it may be due to anxiety.  Are you currently experiencing lightheadedness, SOB or CP?  No   Do you have a history of afib (atrial fibrillation) or irregular heart rhythm?  No   Have you checked your BP or HR? (document readings if available):  Per patient, HR was high, BP was fine, but patient states she doesn't have any readings.  Are you experiencing any other symptoms?  Patient states she has also had chest tightness that started around the same time as the palpitations. She states it may just be indigestion.

## 2023-09-12 NOTE — Telephone Encounter (Signed)
Called patient, she states she has had an increased in palpitations recently. Patient states she has not had any today, but did have some yesterday. Her HR was around 88, but she is not sure if this is anxiety related, or heart related. She was due for a 12 month follow up with Dr.End- patient states she does not have any lightheadedness or dizziness during those episodes, but it does scare her. She requested an appointment to follow up. Patient does not have any BP readings to provide. No changes to her medication, medication list is current per patient.  Scheduled with NP next week 11/19 for EKG and evaluation. Patient verbalized understanding and thankful for visit. Will route to NP to make aware and FYI.

## 2023-09-13 ENCOUNTER — Telehealth: Payer: Self-pay

## 2023-09-13 NOTE — Progress Notes (Signed)
(  Key: [id number]) PA Case ID #: [id number] Rx #: [id number] Need Help? Call us at [phone] Status sent iconSent to Plan today Drug Vitamin D (Ergocalciferol) 1.25 MG(50000 UT) capsules ePA cloud logo Form [facility] Electronic PA Form Original Claim Info [id number] 01CMS EXCLUDED DRUG 02PT. MTM ELIGIBLE. CHECK [email or web address] 03U

## 2023-09-13 NOTE — Telephone Encounter (Signed)
Left message for patient to  call back regarding multiple results.  OK for PEC to give message.

## 2023-09-13 NOTE — Progress Notes (Signed)
(  KeyEllen Henri) PA Case ID #: [id number] Rx #: [id number] Need Help? Call us at [phone] Outcome Denied today by [facility] NCPDP 2017 The drug you asked for is not listed in your preferred drug list (formulary). The preferred drug(s), you may not have tried are: Belsomra tablet AND zolpidem tablet. Your provider needs to give [place] medical reasons why the preferred drug(s) would not work for you and/or would have bad side effects. Sometimes a preferred drug needs more review for approval. Additionally, some preferred drugs listed may be the same drugs with different strengths or forms. [employer] may only require one strength or form of that drug to be tried. This decision was from [employer] Non-Formulary Exceptions Coverage Policy. Drug Zolpidem Tartrate ER 6.25MG  er tablets ePA cloud logo Form [facility] Electronic PA Form Original Claim Info 569,MR 01Try Suzzette Righter [id number] 03P

## 2023-09-13 NOTE — Telephone Encounter (Signed)
-----   Message from [facility] sent at 09/12/2023 12:31 PM EST ----- A1c up slightly to 5.8%, back up in the prediabetic range.  Cholesterol and triglycerides remain elevated.  Is she taking the Repatha as prescribed?  She should try to decrease her fat, carbohydrate and sugar intake and exercise for weight loss.  Her kidney function is slightly decreased but stable.  Would advise her to maximize her water intake.  Liver function electrolytes are normal.  Hematocrit is marginally elevated but in the setting of a normal hemoglobin, this is not concerning but something we will monitor.  Blood counts otherwise normal.  Vitamin D is low.  I am going to send in a vitamin D supplement for her to take weekly for the next 12 weeks.  She should be taking calcium supplement OTC as well.  Thyroid function is normal.

## 2023-09-13 NOTE — Progress Notes (Signed)
(  Key: [id number]) PA Case ID #: [id number] Rx #: [id number] Need Help? Call us at [phone] Outcome Denied today by [facility] NCPDP 2017 The Medicare rule in the Prescription Drug Manual (Chapter 6, Section 20.1) says prescription vitamins and mineral products, except prenatal vitamins for members who are pregnant and fluoride preparations, are excluded from Medicare Part D coverage. Your drug is a prescription vitamin and/or mineral. Per Medicare rules, it is not covered. Drug Vitamin D (Ergocalciferol) 1.25 MG(50000 UT) capsules ePA cloud [employer] Electronic PA Form Original Claim Info [id number] 01CMS EXCLUDED DRUG 02PT. MTM ELIGIBLE. CHECK [email or web address] 03U

## 2023-09-13 NOTE — Telephone Encounter (Signed)
Last office visit 10/17/22 with plan to f/u in 12 months  next office visit:  09/17/23

## 2023-09-13 NOTE — Progress Notes (Signed)
(  Key: [id number]) Rx #: [id number] Need Help? Call us at [phone] Status sent iconSent to Plan today Drug Vitamin D (Ergocalciferol) 1.25 MG(50000 UT) capsules ePA cloud logo Form [facility] Electronic PA Form Original Claim Info [id number] 01CMS EXCLUDED DRUG 02PT. MTM ELIGIBLE. CHECK [email or web address] 03U

## 2023-09-16 NOTE — Progress Notes (Signed)
That is no problem if it is not covered. Since it is a supplement.  She can do OTC version Vitamin D3 5,000 unit DAILY instead of the 50k weekly.  [name], DO [facility] 09/16/2023, 12:05 PM

## 2023-09-16 NOTE — Progress Notes (Unsigned)
Cardiology Clinic Note   Date: 09/17/2023 ID: [name], DOB 10-Sep-1951, MRN [id number]  Primary Cardiologist:  Yvonne Kendall, MD  Patient Profile    Kristin Parker is a 72 y.o. female who presents to the clinic today for multiple complaints.     Past medical history significant for: Mild to moderate CAD. LHC 06/25/2017 (abnormal stress test): Distal LM 40%.  Ostial LAD 20%.  Mid LAD 20 to 30%.  Proximal and mid LCx 30%.  Recommend medical therapy and aggressive risk factor modification. Coronary CTA 09/29/2020: Coronary calcium score 192 (79th percentile).  Calcified plaque causing mild stenosis proximal LM and LCx.  Moderate ascending and descending aortic wall calcifications.  Grade 3 atheroma in the descending thoracic aorta.  Mild nonobstructive CAD. Dyspnea. Echo 10/12/2020: EF 55 to 60%.  No RWMA.  Mild LVH.  Grade I DD.  Normal strain.  Normal RV function.  Trivial MR.  Mild aortic valve sclerosis without stenosis. Carotid artery stenosis/left subclavian artery stenosis.   Carotid duplex 10/08/2019: Bilateral ICA 1 to 39%.  Left subclavian artery was stenotic. Mitral regurgitation. Hypertension. Hyperlipidemia. Lipid panel 09/11/2023: LDL 154, HDL 38, TG 272, total 237. COPD. Former tobacco abuse.  In summary, patient was first evaluated by Dr. Okey Dupre on 01/24/2017 for heart murmur and carotid bruit at the request of Dr. Juanetta Gosling.  Carotid duplex showed mild to moderate stenosis of bilateral carotids.  Echo showed normal LV function, Grade I DD, mild MR, aortic valve sclerosis without stenosis.  In August 2018 patient complained of stable DOE, nocturnal chest pain, and fatigue.  Exercise stress test showed decreased exercise capacity with hypertensive response and failure to achieve target heart rate.  1.5 horizontal ST depressions noted in II, III, and aVF at peak stress and early recovery.  LHC showed mild to moderate CAD with recommendation for medical therapy and aggressive  risk factor modification.     History of Present Illness    Kristin Parker is followed by Dr. Okey Dupre for the above outlined history.  Patient was last seen in the office by Dr. Okey Dupre on 10/17/2022 for routine follow-up.  She described heartburn symptoms and it was recommended she try famotidine and follow-up with GI.  No other medication changes were recommended.  Patient contacted the office on 09/12/2023 with complaints of palpitations and chest tightness.  Per triage LPN: "Called patient, she states she has had an increased in palpitations recently. Patient states she has not had any today, but did have some yesterday. Her HR was around 88, but she is not sure if this is anxiety related, or heart related. She was due for a 12 month follow up with Dr.End- patient states she does not have any lightheadedness or dizziness during those episodes, but it does scare her. She requested an appointment to follow up. Patient does not have any BP readings to provide. No changes to her medication, medication list is current per patient."   Discussed the use of AI scribe software for clinical note transcription with the patient, who gave verbal consent to proceed.  The patient presents alone with several complaints. She reports progressive dyspnea and chest tightness. The dyspnea has been going on for months but has been increasing in frequency for the last 2 weeks. Initially it was occurring with harder exertion but now it is happening with less exertion. She reports she is constantly moving - doing things around her home and in her yard. Patient has a difficult time describing chest tightness.  She initially states it occurs at while lying in bed and is associated with palpitations. She then states it can occur at other times with and without exertion and will sometimes radiate up her neck to her jaw and down her left arm. It is not always associated with shortness of breath. She also reports two different types of  palpitations. She feels her heart racing with exertion and typically when she is dyspneic. She also feels a slow pounding sensation when she is trying to sleep at night and it is sometimes accompanied by chest tightness. She does report an anxiety component to her both the palpitations and chest tightness. It is unclear if the chest tightness she experiences in bed is the same as the tightness she gets with exertion.         ROS: All other systems reviewed and are otherwise negative except as noted in History of Present Illness.  Studies Reviewed    EKG Interpretation Date/Time:  Tuesday September 17 2023 10:59:52 EST Ventricular Rate:  67 PR Interval:  146 QRS Duration:  82 QT Interval:  418 QTC Calculation: 441 R Axis:   46  Text Interpretation: Normal sinus rhythm Nonspecific ST abnormality When compared with ECG of 10/17/2022 (Not in Muse) no significant change Confirmed by Carlos Levering [phone]) on 09/17/2023 11:09:24 AM   Risk Assessment/Calculations          STOP-Bang Score:          Physical Exam    VS:  BP 124/60 (BP Location: Left Arm, Patient Position: Sitting, Cuff Size: Normal)   Pulse 67   Ht 5\' 7"  (1.702 m)   Wt 170 lb 12.8 oz (77.5 kg)   SpO2 93%   BMI 26.75 kg/m  , BMI Body mass index is 26.75 kg/m.  GEN: Well nourished, well developed, in no acute distress. Neck: No JVD or carotid bruits. Cardiac:  RRR. No murmurs. No rubs or gallops.   Respiratory:  Respirations regular and unlabored. Clear to auscultation without rales, wheezing or rhonchi. GI: Soft, nontender, nondistended. Extremities: Radials/DP/PT 2+ and equal bilaterally. No clubbing or cyanosis. No edema.  Skin: Warm and dry, no rash. Neuro: Strength intact.  Assessment & Plan      Palpitations Patient reports feeling as though her heart is racing related to activity. This seems to happen with heavier exertion. She also feels her heart pounding at night when she is laying in bed with  associated chest tightness. She feels this could be related to anxiety.  -Patient would like to hold off on further assessment of this in favor of the other testing being ordered today.   CAD/Chest tightness LHC August 2018 showed mild to moderate CAD with recommendation for medical management.  Coronary CTA December 2021 showed calcium score of 192 with mild, mild nonobstructive CAD.  Patient reports chest tightness with and without exertion occasionally radiating to left neck and jaw and down left arm. It is not always associated with shortness of breath. Will get coronary CTA for further evaluation.  -Continue amlodipine, isosorbide, aspirin, Repatha.  Chronic dyspnea Echo December 2021 showed normal LV function, Grade I DD trivial MR, mild aortic valve sclerosis without stenosis.  Patient feels this has increased over the past several months particularly in the last 2 weeks. She feels it is occurring more frequently and with less exertion.  -Echo for further evaluation.   Hypertension BP today 124/60. Patient reports positional dizziness.  -Continue losartan, amlodipine, isosorbide.  Carotid artery disease Carotid  duplex December 2020 showed bilateral ICA 1-39%. She reports some positional dizziness.  -Repeat carotid duplex for surveillance.   Hyperlipidemia LDL November 2024 154, not at goal.  Patient denies missing any doses of Repatha. She is going to speak with her PCP to see if this can be repeated, as she feels it is a lab error.  -Continue Repatha.  Tobacco abuse Patient reports complete cessation for nearly 3 year.  -She is congratulated and encouraged to continue complete cessation.          Disposition: Echo. Coronary CTA. Carotid duplex. Return in 6-8 weeks after testing or sooner as needed.        Signed, Etta Grandchild. Tawanda Schall, DNP, NP-C

## 2023-09-16 NOTE — Progress Notes (Signed)
Understood. She will need to use goodrx coupon for Zolpidem CR if she prefers to keep this dose. Otherwise we can consider the preferred Belsomra or Zopidem (not continued release).  [name], DO [facility] 09/16/2023, 12:15 PM

## 2023-09-17 ENCOUNTER — Encounter (HOSPITAL_COMMUNITY): Payer: Self-pay

## 2023-09-17 ENCOUNTER — Encounter: Payer: Self-pay | Admitting: Student

## 2023-09-17 ENCOUNTER — Ambulatory Visit: Payer: Medicare HMO | Admitting: Student

## 2023-09-17 ENCOUNTER — Ambulatory Visit: Payer: Medicare HMO | Attending: Student | Admitting: Student

## 2023-09-17 VITALS — BP 124/60 | HR 67 | Ht 67.0 in | Wt 170.8 lb

## 2023-09-17 DIAGNOSIS — I2510 Atherosclerotic heart disease of native coronary artery without angina pectoris: Secondary | ICD-10-CM | POA: Diagnosis not present

## 2023-09-17 DIAGNOSIS — E785 Hyperlipidemia, unspecified: Secondary | ICD-10-CM

## 2023-09-17 DIAGNOSIS — R002 Palpitations: Secondary | ICD-10-CM | POA: Diagnosis not present

## 2023-09-17 DIAGNOSIS — Z720 Tobacco use: Secondary | ICD-10-CM

## 2023-09-17 DIAGNOSIS — R0609 Other forms of dyspnea: Secondary | ICD-10-CM | POA: Diagnosis not present

## 2023-09-17 DIAGNOSIS — R072 Precordial pain: Secondary | ICD-10-CM

## 2023-09-17 DIAGNOSIS — I10 Essential (primary) hypertension: Secondary | ICD-10-CM

## 2023-09-17 DIAGNOSIS — I6523 Occlusion and stenosis of bilateral carotid arteries: Secondary | ICD-10-CM | POA: Diagnosis not present

## 2023-09-17 MED ORDER — METOPROLOL TARTRATE 100 MG PO TABS: 100.0000 mg | ORAL_TABLET | Freq: Once | ORAL | 0 refills | Status: DC

## 2023-09-17 NOTE — Patient Instructions (Signed)
Medication Instructions:  Your physician recommends that you continue on your current medications as directed. Please refer to the Current Medication list given to you today.  *If you need a refill on your cardiac medications before your next appointment, please call your pharmacy*  Lab Work: - None ordered  Testing/Procedures: Your physician has requested that you have an echocardiogram. Echocardiography is a painless test that uses sound waves to create images of your heart. It provides your doctor with information about the size and shape of your heart and how well your heart's chambers and valves are working. This procedure takes approximately one hour. There are no restrictions for this procedure. Please do NOT wear cologne, perfume, aftershave, or lotions (deodorant is allowed). Please arrive 15 minutes prior to your appointment time.  Please note: We ask at that you not bring children with you during ultrasound (echo/ vascular) testing. Due to room size and safety concerns, children are not allowed in the ultrasound rooms during exams. Our front office staff cannot provide observation of children in our lobby area while testing is being conducted. An adult accompanying a patient to their appointment will only be allowed in the ultrasound room at the discretion of the ultrasound technician under special circumstances. We apologize for any inconvenience.     Your cardiac CT will be scheduled at one of the below locations:   [facility] [address] [place], [place] [address] [phone]  OR   [facility] [address] [place], [place] [address] [phone]  If scheduled at [facility] or [facility], please arrive 15 mins early for check-in and test prep.  There is spacious parking and easy access to the radiology department from the [facility]  entrance. Please enter here and check-in with the desk attendant.   Please follow these instructions carefully (unless otherwise directed):  On the Night Before the Test: Be sure to Drink plenty of water. Do not consume any caffeinated/decaffeinated beverages or chocolate 12 hours prior to your test. Do not take any antihistamines 12 hours prior to your test.  On the Day of the Test: Drink plenty of water until 1 hour prior to the test. Do not eat any food 1 hour prior to test. You may take your regular medications prior to the test.  Take metoprolol (Lopressor) two hours prior to test. If you take Furosemide/Hydrochlorothiazide/Spironolactone, please HOLD on the morning of the test. FEMALES- please wear underwire-free bra if available, avoid dresses & tight clothing      After the Test: Drink plenty of water. After receiving IV contrast, you may experience a mild flushed feeling. This is normal. On occasion, you may experience a mild rash up to 24 hours after the test. This is not dangerous. If this occurs, you can take Benadryl 25 mg and increase your fluid intake. If you experience trouble breathing, this can be serious. If it is severe call 911 IMMEDIATELY. If it is mild, please call our office. If you take any of these medications: Glipizide/Metformin, Avandament, Glucavance, please do not take 48 hours after completing test unless otherwise instructed.  We will call to schedule your test 2-4 weeks out understanding that some insurance companies will need an authorization prior to the service being performed.   For more information and frequently asked questions, please visit our website : [email or web address]  For non-scheduling related questions, please contact the cardiac imaging nurse navigator should you  have any questions/concerns: Cardiac Imaging Nurse Navigators Direct Office Dial: [phone]   For scheduling needs, including cancellations and rescheduling,  please call [place], [phone].  Your physician has requested that you have a carotid duplex. This test is an ultrasound of the carotid arteries in your neck. It looks at blood flow through these arteries that supply the brain with blood. Allow one hour for this exam. There are no restrictions or special instructions.    Follow-Up: At [facility], you and your health needs are our priority.  As part of our continuing mission to provide you with exceptional heart care, we have created designated Provider Care Teams.  These Care Teams include your primary Cardiologist (physician) and Advanced Practice Providers (APPs -  Physician Assistants and Nurse Practitioners) who all work together to provide you with the care you need, when you need it.  We recommend signing up for the patient portal called "MyChart".  Sign up information is provided on this After Visit Summary.  MyChart is used to connect with patients for Virtual Visits (Telemedicine).  Patients are able to view lab/test results, encounter notes, upcoming appointments, etc.  Non-urgent messages can be sent to your provider as well.   To learn more about what you can do with MyChart, go to [email or web address].    Your next appointment:   6 - 8 week(s)   Provider:   Carlos Levering, NP    Other Instructions - None

## 2023-09-18 ENCOUNTER — Telehealth (HOSPITAL_COMMUNITY): Payer: Self-pay | Admitting: *Deleted

## 2023-09-18 NOTE — Telephone Encounter (Signed)
Attempted to call patient regarding upcoming cardiac CT appointment. Left message on voicemail with name and callback number Hayley Sharpe RN Navigator Cardiac Imaging [facility] [phone] Office   

## 2023-09-19 ENCOUNTER — Ambulatory Visit
Admission: RE | Admit: 2023-09-19 | Discharge: 2023-09-19 | Disposition: A | Discharge status: Home / Self Care | Payer: Medicare HMO | Source: Ambulatory Visit | Attending: Student | Admitting: Student

## 2023-09-19 DIAGNOSIS — R002 Palpitations: Secondary | ICD-10-CM | POA: Insufficient documentation

## 2023-09-19 DIAGNOSIS — R072 Precordial pain: Secondary | ICD-10-CM | POA: Diagnosis not present

## 2023-09-19 DIAGNOSIS — I2510 Atherosclerotic heart disease of native coronary artery without angina pectoris: Secondary | ICD-10-CM | POA: Diagnosis not present

## 2023-09-19 MED ORDER — SODIUM CHLORIDE 0.9 % IV SOLN: INTRAVENOUS | Status: DC

## 2023-09-19 MED ORDER — IOHEXOL 350 MG/ML SOLN
75.0000 mL | Freq: Once | INTRAVENOUS | Status: AC | PRN
  Administered 2023-09-19: 75 mL via INTRAVENOUS

## 2023-09-19 MED ORDER — NITROGLYCERIN 0.4 MG SL SUBL
0.4000 mg | SUBLINGUAL_TABLET | Freq: Once | SUBLINGUAL | Status: AC
Start: 2023-09-19 — End: 2023-09-19
  Administered 2023-09-19: 0.4 mg via SUBLINGUAL

## 2023-09-19 NOTE — Progress Notes (Signed)
Patient tolerated procedure well. Ambulate w/o difficulty. Denies light headedness or being dizzy. Sitting up drinking water provided. Encouraged to drink extra water today and reasoning explained. Verbalized understanding. All questions answered. ABC intact. No further needs. Discharge from procedure area w/o issues.

## 2023-10-01 ENCOUNTER — Other Ambulatory Visit: Payer: Self-pay | Admitting: Internal Medicine

## 2023-10-01 DIAGNOSIS — E785 Hyperlipidemia, unspecified: Secondary | ICD-10-CM

## 2023-10-01 DIAGNOSIS — I25118 Atherosclerotic heart disease of native coronary artery with other forms of angina pectoris: Secondary | ICD-10-CM

## 2023-10-03 ENCOUNTER — Other Ambulatory Visit: Payer: Self-pay | Admitting: Student

## 2023-10-03 DIAGNOSIS — R072 Precordial pain: Secondary | ICD-10-CM

## 2023-10-03 DIAGNOSIS — I2510 Atherosclerotic heart disease of native coronary artery without angina pectoris: Secondary | ICD-10-CM

## 2023-10-03 DIAGNOSIS — I6523 Occlusion and stenosis of bilateral carotid arteries: Secondary | ICD-10-CM

## 2023-10-03 DIAGNOSIS — Z720 Tobacco use: Secondary | ICD-10-CM

## 2023-10-03 DIAGNOSIS — R002 Palpitations: Secondary | ICD-10-CM

## 2023-10-03 DIAGNOSIS — E785 Hyperlipidemia, unspecified: Secondary | ICD-10-CM

## 2023-10-03 DIAGNOSIS — R0609 Other forms of dyspnea: Secondary | ICD-10-CM

## 2023-10-03 DIAGNOSIS — I10 Essential (primary) hypertension: Secondary | ICD-10-CM

## 2023-10-08 ENCOUNTER — Other Ambulatory Visit: Payer: Self-pay

## 2023-10-08 DIAGNOSIS — R9389 Abnormal findings on diagnostic imaging of other specified body structures: Secondary | ICD-10-CM

## 2023-10-11 ENCOUNTER — Other Ambulatory Visit: Payer: Self-pay | Admitting: Student

## 2023-10-11 DIAGNOSIS — I6523 Occlusion and stenosis of bilateral carotid arteries: Secondary | ICD-10-CM

## 2023-10-11 DIAGNOSIS — I2510 Atherosclerotic heart disease of native coronary artery without angina pectoris: Secondary | ICD-10-CM

## 2023-10-11 DIAGNOSIS — E785 Hyperlipidemia, unspecified: Secondary | ICD-10-CM

## 2023-10-11 DIAGNOSIS — R0609 Other forms of dyspnea: Secondary | ICD-10-CM

## 2023-10-11 DIAGNOSIS — Z720 Tobacco use: Secondary | ICD-10-CM

## 2023-10-11 DIAGNOSIS — R002 Palpitations: Secondary | ICD-10-CM

## 2023-10-11 DIAGNOSIS — I10 Essential (primary) hypertension: Secondary | ICD-10-CM

## 2023-10-11 DIAGNOSIS — R072 Precordial pain: Secondary | ICD-10-CM

## 2023-10-15 ENCOUNTER — Ambulatory Visit: Payer: Medicare HMO | Attending: Student

## 2023-10-15 ENCOUNTER — Ambulatory Visit: Payer: Medicare HMO

## 2023-10-18 ENCOUNTER — Ambulatory Visit: Payer: Medicare HMO | Admitting: Student in an Organized Health Care Education/Training Program

## 2023-10-18 ENCOUNTER — Ambulatory Visit
Admission: RE | Admit: 2023-10-18 | Discharge: 2023-10-18 | Disposition: A | Discharge status: Home / Self Care | Payer: Medicare HMO | Attending: Student | Admitting: Student

## 2023-10-18 ENCOUNTER — Ambulatory Visit
Admission: RE | Admit: 2023-10-18 | Discharge: 2023-10-18 | Disposition: A | Discharge status: Home / Self Care | Payer: Medicare HMO | Source: Ambulatory Visit | Attending: Student | Admitting: Student

## 2023-10-18 ENCOUNTER — Encounter: Payer: Self-pay | Admitting: Student in an Organized Health Care Education/Training Program

## 2023-10-18 VITALS — BP 130/76 | HR 69 | Temp 97.6°F | Ht 67.0 in | Wt 176.2 lb

## 2023-10-18 DIAGNOSIS — R9389 Abnormal findings on diagnostic imaging of other specified body structures: Secondary | ICD-10-CM

## 2023-10-18 DIAGNOSIS — J439 Emphysema, unspecified: Secondary | ICD-10-CM

## 2023-10-18 DIAGNOSIS — R918 Other nonspecific abnormal finding of lung field: Secondary | ICD-10-CM | POA: Diagnosis not present

## 2023-10-18 DIAGNOSIS — R911 Solitary pulmonary nodule: Secondary | ICD-10-CM

## 2023-10-18 NOTE — Progress Notes (Unsigned)
Synopsis: Referred in *** by [name] *  Assessment & Plan:   1. Pulmonary emphysema, unspecified emphysema type (HCC) (Primary) *** - AMB referral to pulmonary rehabilitation  Continues to be short of breath, didn't do pulm rehab, will resend referral. On trelegy, tolerating, feels better than breztri. Counseled at length re weight loss (loves ice cream). Follow up after repeat chest CT march of 2025. Lungs clear  Return in about 4 months (around 02/16/2024).  I spent *** minutes caring for this patient today, including {EM billing:[id number]}  Raechel Chute, MD [facility] 10/18/2023 11:17 AM    End of visit medications:  No orders of the defined types were placed in this encounter.    Current Outpatient Medications:    acetaminophen (TYLENOL) 500 MG tablet, Take 2 tablets (1,000 mg total) by mouth every 6 (six) hours as needed for mild pain., Disp: , Rfl:    acetic acid-hydrocortisone (VOSOL-HC) OTIC solution, Place 3 drops into both ears 3 (three) times daily. May use wick for first 24 hours, Disp: 10 mL, Rfl: 0   albuterol (VENTOLIN HFA) 108 (90 Base) MCG/ACT inhaler, INHALE 2 PUFFS EVERY 4 HOURS FOR WHEEZING OR SHORTNESS OF BREATH AS NEEDED, Disp: 18 g, Rfl: 3   amLODipine (NORVASC) 10 MG tablet, TAKE 1 TABLET BY MOUTH ONCE DAILY, Disp: 60 tablet, Rfl: 0   aspirin EC 81 MG tablet, Take 1 tablet (81 mg total) by mouth daily., Disp: , Rfl:    busPIRone (BUSPAR) 5 MG tablet, Take 1 tablet (5 mg total) by mouth 2 (two) times daily., Disp: 180 tablet, Rfl: 3   Evolocumab (REPATHA SURECLICK) 140 MG/ML SOAJ, INJECT 1 DOSE INTO THE SKIN EVERY 14 DAYS, Disp: 6 mL, Rfl: 0   famotidine (PEPCID) 20 MG tablet, TAKE 1 TABLET BY MOUTH TWICE DAILY, Disp: 180 tablet, Rfl: 0   fluticasone (FLONASE) 50 MCG/ACT nasal spray, USE 2 SPRAYS INTO EACH NOSTRIL ONCE DAILY, Disp: 48 g, Rfl: 0   Fluticasone-Umeclidin-Vilant (TRELEGY ELLIPTA) 200-62.5-25 MCG/ACT AEPB,  Inhale 1 Inhalation into the lungs daily., Disp: 30 each, Rfl: 12   hydrOXYzine (VISTARIL) 25 MG capsule, TAKE 1 CAPSULE BY MOUTH EVERY 8 HOURS ASNEEDED, Disp: 90 capsule, Rfl: 3   icosapent Ethyl (VASCEPA) 1 g capsule, Take 2 capsules (2 g total) by mouth 2 (two) times daily., Disp: 360 capsule, Rfl: 3   isosorbide mononitrate (IMDUR) 60 MG 24 hr tablet, TAKE 1 TABLET BY MOUTH ONCE DAILY, Disp: 30 tablet, Rfl: 0   losartan (COZAAR) 100 MG tablet, TAKE 1 TABLET BY MOUTH ONCE DAILY, Disp: 90 tablet, Rfl: 0   meloxicam (MOBIC) 15 MG tablet, TAKE 1 TABLET BY MOUTH ONCE DAILY AS NEEDED FOR PAIN WITH FOOD, Disp: 30 tablet, Rfl: 5   metoprolol tartrate (LOPRESSOR) 100 MG tablet, Take 1 tablet (100 mg total) by mouth once for 1 dose. 2 hours prior to CT, Disp: 1 tablet, Rfl: 0   sertraline (ZOLOFT) 100 MG tablet, TAKE 1 TABLET BY MOUTH ONCE DAILY, Disp: 90 tablet, Rfl: 1   Vitamin D, Ergocalciferol, (DRISDOL) 1.25 MG (50000 UNIT) CAPS capsule, Take 1 capsule (50,000 Units total) by mouth once a week. For 12 weeks. Then start OTC Vitamin D3 2,000 unit daily., Disp: 12 capsule, Rfl: 0   zolpidem (AMBIEN CR) 6.25 MG CR tablet, Take 1 tablet (6.25 mg total) by mouth at bedtime as needed. for sleep, Disp: 30 tablet, Rfl: 5   Subjective:   PATIENT ID: Kristin Parker GENDER:  female DOB: 1951/05/21, MRN: [id number]  Chief Complaint  Patient presents with   Follow-up    Shortness of breath on exertion. No cough or wheezing.    HPI ***  Ancillary information including prior medications, full medical/surgical/family/social histories, and PFTs (when available) are listed below and have been reviewed.   ROS   Objective:   Vitals:   10/18/23 1055  BP: 130/76  Pulse: 69  Temp: 97.6 F (36.4 C)  TempSrc: Temporal  SpO2: 93%  Weight: 176 lb 3.2 oz (79.9 kg)  Height: 5\' 7"  (1.702 m)   93% on *** LPM *** RA BMI Readings from Last 3 Encounters:  10/18/23 27.60 kg/m  09/17/23 26.75 kg/m  09/11/23  26.94 kg/m   Wt Readings from Last 3 Encounters:  10/18/23 176 lb 3.2 oz (79.9 kg)  09/17/23 170 lb 12.8 oz (77.5 kg)  09/11/23 172 lb (78 kg)    Physical Exam    Ancillary Information    Past Medical History:  Diagnosis Date   Anxiety    Aortic valve sclerosis    Bilateral carotid bruits    Carotid artery stenosis    COPD (chronic obstructive pulmonary disease) (HCC)    Hypertension    Mitral regurgitation    Mixed hyperlipidemia    Shoulder pain    Sleeping difficulties      Family History  Problem Relation Age of Onset   Cancer Mother        bladder cancer   Heart disease Mother 25       Pacemaker   Cancer Father        lung   Multiple sclerosis Sister    Valvular heart disease Brother 55       s/p bioprosthetic valve replacement at [facility]   Breast cancer Neg Hx      Past Surgical History:  Procedure Laterality Date   BREAST BIOPSY Left 08/13/2018   affirm bx, x clip, path pending   CARDIAC CATHETERIZATION     CATARACT EXTRACTION Left    2017   CHOLECYSTECTOMY     COLONOSCOPY  2012   Dr Bluford Kaufmann   COLONOSCOPY WITH PROPOFOL N/A 05/15/2021   Procedure: COLONOSCOPY WITH PROPOFOL;  Surgeon: Kristin Mood, MD;  Location: [facility] ENDOSCOPY;  Service: Gastroenterology;  Laterality: N/A;   COLONOSCOPY WITH PROPOFOL N/A 07/03/2022   Procedure: COLONOSCOPY WITH PROPOFOL;  Surgeon: Kristin Mood, MD;  Location: [facility] ENDOSCOPY;  Service: Gastroenterology;  Laterality: N/A;   EYE SURGERY     LEFT HEART CATH AND CORONARY ANGIOGRAPHY N/A 06/25/2017   Procedure: LEFT HEART CATH AND CORONARY ANGIOGRAPHY;  Surgeon: Kristin Kendall, MD;  Location: [facility] INVASIVE CV LAB;  Service: Cardiovascular;  Laterality: N/A;    Social History   Socioeconomic History   Marital status: Divorced    Spouse name: Not on file   Number of children: Not on file   Years of education: Not on file   Highest education level: GED or equivalent  Occupational History   Occupation: retired   Tobacco Use    Smoking status: Former    Current packs/day: 0.00    Average packs/day: 0.5 packs/day for 30.0 years (15.0 ttl pk-yrs)    Types: Cigarettes    Start date: 10/30/1990    Quit date: 10/30/2020    Years since quitting: 2.9   Smokeless tobacco: Never   Tobacco comments:    Quit Jan 2022  Vaping Use   Vaping status: Former  Substance and Sexual Activity   Alcohol use:  No    Alcohol/week: 0.0 standard drinks of alcohol   Drug use: No   Sexual activity: Not on file  Other Topics Concern   Not on file  Social History Narrative   Not on file   Social Drivers of Health   Financial Resource Strain: Low Risk  (02/07/2023)   Overall Financial Resource Strain (CARDIA)    Difficulty of Paying Living Expenses: Not hard at all  Food Insecurity: No Food Insecurity (02/07/2023)   Hunger Vital Sign    Worried About Running Out of Food in the Last Year: Never true    Ran Out of Food in the Last Year: Never true  Transportation Needs: No Transportation Needs (02/07/2023)   PRAPARE - Administrator, Civil Service (Medical): No    Lack of Transportation (Non-Medical): No  Physical Activity: Insufficiently Active (02/07/2023)   Exercise Vital Sign    Days of Exercise per Week: 3 days    Minutes of Exercise per Session: 30 min  Stress: No Stress Concern Present (02/07/2023)   [employer] of Occupational Health - Occupational Stress Questionnaire    Feeling of Stress : Not at all  Social Connections: Socially Isolated (02/07/2023)   Social Connection and Isolation Panel [NHANES]    Frequency of Communication with Friends and Family: More than three times a week    Frequency of Social Gatherings with Friends and Family: More than three times a week    Attends Religious Services: Never    Database administrator or Organizations: No    Attends Banker Meetings: Never    Marital Status: Divorced  Catering manager Violence: Not At Risk (02/07/2023)   Humiliation, Afraid,  Rape, and Kick questionnaire    Fear of Current or Ex-Partner: No    Emotionally Abused: No    Physically Abused: No    Sexually Abused: No     Allergies  Allergen Reactions   Bactrim [Sulfamethoxazole-Trimethoprim] Nausea And Vomiting   Aleve [Naproxen Sodium] Swelling    Lips. But patient can take Ibuprofen without problems   Atorvastatin     myalgias   Crestor [Rosuvastatin]     5mg , 20mg  - myalgias   Simvastatin     myalgias   Zetia [Ezetimibe]     myalgias     CBC    Component Value Date/Time   WBC 9.4 09/11/2023 0844   RBC 5.00 09/11/2023 0844   HGB 15.2 09/11/2023 0844   HGB 14.6 07/22/2019 1034   HCT 45.1 (H) 09/11/2023 0844   HCT 42.1 07/22/2019 1034   PLT 312 09/11/2023 0844   PLT 267 07/22/2019 1034   MCV 90.2 09/11/2023 0844   MCV 90 07/22/2019 1034   MCH 30.4 09/11/2023 0844   MCHC 33.7 09/11/2023 0844   RDW 13.2 09/11/2023 0844   RDW 12.9 07/22/2019 1034   LYMPHSABS 2,512 09/06/2021 0820   MONOABS 0.6 06/24/2017 1616   EOSABS 216 09/11/2023 0844   BASOSABS 85 09/11/2023 0844    Pulmonary Functions Testing Results:    Latest Ref Rng & Units 03/26/2023    2:46 PM  PFT Results  FVC-Pre L 2.12   FVC-Predicted Pre % 64   FVC-Post L 2.18   FVC-Predicted Post % 66   Pre FEV1/FVC % % 62   Post FEV1/FCV % % 65   FEV1-Pre L 1.32   FEV1-Predicted Pre % 53   FEV1-Post L 1.41   DLCO uncorrected ml/min/mmHg 8.83   DLCO UNC% % 41  DLVA Predicted % 64   TLC L 5.17   TLC % Predicted % 93   RV % Predicted % 126     Outpatient Medications Prior to Visit  Medication Sig Dispense Refill   acetaminophen (TYLENOL) 500 MG tablet Take 2 tablets (1,000 mg total) by mouth every 6 (six) hours as needed for mild pain.     acetic acid-hydrocortisone (VOSOL-HC) OTIC solution Place 3 drops into both ears 3 (three) times daily. May use wick for first 24 hours 10 mL 0   albuterol (VENTOLIN HFA) 108 (90 Base) MCG/ACT inhaler INHALE 2 PUFFS EVERY 4 HOURS FOR WHEEZING  OR SHORTNESS OF BREATH AS NEEDED 18 g 3   amLODipine (NORVASC) 10 MG tablet TAKE 1 TABLET BY MOUTH ONCE DAILY 60 tablet 0   aspirin EC 81 MG tablet Take 1 tablet (81 mg total) by mouth daily.     busPIRone (BUSPAR) 5 MG tablet Take 1 tablet (5 mg total) by mouth 2 (two) times daily. 180 tablet 3   Evolocumab (REPATHA SURECLICK) 140 MG/ML SOAJ INJECT 1 DOSE INTO THE SKIN EVERY 14 DAYS 6 mL 0   famotidine (PEPCID) 20 MG tablet TAKE 1 TABLET BY MOUTH TWICE DAILY 180 tablet 0   fluticasone (FLONASE) 50 MCG/ACT nasal spray USE 2 SPRAYS INTO EACH NOSTRIL ONCE DAILY 48 g 0   Fluticasone-Umeclidin-Vilant (TRELEGY ELLIPTA) 200-62.5-25 MCG/ACT AEPB Inhale 1 Inhalation into the lungs daily. 30 each 12   hydrOXYzine (VISTARIL) 25 MG capsule TAKE 1 CAPSULE BY MOUTH EVERY 8 HOURS ASNEEDED 90 capsule 3   icosapent Ethyl (VASCEPA) 1 g capsule Take 2 capsules (2 g total) by mouth 2 (two) times daily. 360 capsule 3   isosorbide mononitrate (IMDUR) 60 MG 24 hr tablet TAKE 1 TABLET BY MOUTH ONCE DAILY 30 tablet 0   losartan (COZAAR) 100 MG tablet TAKE 1 TABLET BY MOUTH ONCE DAILY 90 tablet 0   meloxicam (MOBIC) 15 MG tablet TAKE 1 TABLET BY MOUTH ONCE DAILY AS NEEDED FOR PAIN WITH FOOD 30 tablet 5   metoprolol tartrate (LOPRESSOR) 100 MG tablet Take 1 tablet (100 mg total) by mouth once for 1 dose. 2 hours prior to CT 1 tablet 0   sertraline (ZOLOFT) 100 MG tablet TAKE 1 TABLET BY MOUTH ONCE DAILY 90 tablet 1   Vitamin D, Ergocalciferol, (DRISDOL) 1.25 MG (50000 UNIT) CAPS capsule Take 1 capsule (50,000 Units total) by mouth once a week. For 12 weeks. Then start OTC Vitamin D3 2,000 unit daily. 12 capsule 0   zolpidem (AMBIEN CR) 6.25 MG CR tablet Take 1 tablet (6.25 mg total) by mouth at bedtime as needed. for sleep 30 tablet 5   No facility-administered medications prior to visit.

## 2023-11-01 ENCOUNTER — Encounter: Payer: Self-pay | Admitting: Student

## 2023-11-01 ENCOUNTER — Telehealth: Payer: Self-pay | Admitting: Student

## 2023-11-01 NOTE — Telephone Encounter (Signed)
 error

## 2023-11-01 NOTE — Telephone Encounter (Signed)
 Patient called stating she would like to have her echo and carotid testing done at [facility] on [address].

## 2023-11-01 NOTE — Telephone Encounter (Signed)
 The patient called inquiring if she could have her carotid and ECHO performed at [facility]. The nurse informed the patient that we are unable to place an order for their facility due to it being under [facility]. The patient verbalized understanding and stated she would like to proceed with scheduling at our office  Will forward to scheduling.

## 2023-11-12 ENCOUNTER — Ambulatory Visit: Payer: Medicare HMO | Admitting: Student

## 2023-11-20 ENCOUNTER — Ambulatory Visit: Payer: Medicare PPO | Attending: Student

## 2023-11-20 ENCOUNTER — Ambulatory Visit (INDEPENDENT_AMBULATORY_CARE_PROVIDER_SITE_OTHER): Payer: Medicare HMO | Admitting: Family Medicine

## 2023-11-20 ENCOUNTER — Other Ambulatory Visit: Payer: Medicare HMO

## 2023-11-20 ENCOUNTER — Encounter: Payer: Self-pay | Admitting: Family Medicine

## 2023-11-20 ENCOUNTER — Ambulatory Visit (INDEPENDENT_AMBULATORY_CARE_PROVIDER_SITE_OTHER): Payer: Medicare PPO

## 2023-11-20 VITALS — BP 142/74 | HR 62 | Ht 67.0 in | Wt 177.0 lb

## 2023-11-20 DIAGNOSIS — I2510 Atherosclerotic heart disease of native coronary artery without angina pectoris: Secondary | ICD-10-CM | POA: Diagnosis not present

## 2023-11-20 DIAGNOSIS — F0781 Postconcussional syndrome: Secondary | ICD-10-CM | POA: Diagnosis not present

## 2023-11-20 DIAGNOSIS — G43909 Migraine, unspecified, not intractable, without status migrainosus: Secondary | ICD-10-CM

## 2023-11-20 DIAGNOSIS — I6523 Occlusion and stenosis of bilateral carotid arteries: Secondary | ICD-10-CM

## 2023-11-20 DIAGNOSIS — R0609 Other forms of dyspnea: Secondary | ICD-10-CM

## 2023-11-20 DIAGNOSIS — B3731 Acute candidiasis of vulva and vagina: Secondary | ICD-10-CM | POA: Diagnosis not present

## 2023-11-20 DIAGNOSIS — R072 Precordial pain: Secondary | ICD-10-CM

## 2023-11-20 DIAGNOSIS — G44311 Acute post-traumatic headache, intractable: Secondary | ICD-10-CM

## 2023-11-20 LAB — ECHOCARDIOGRAM COMPLETE
AR max vel: 1.23 cm2
AV Area VTI: 1.21 cm2
AV Area mean vel: 1.1 cm2
AV Mean grad: 8.5 mm[Hg]
AV Peak grad: 18 mm[Hg]
Ao pk vel: 2.12 m/s
Area-P 1/2: 3.12 cm2
Calc EF: 52.3 %
Height: 67 in
S' Lateral: 3.5 cm
Single Plane A2C EF: 51.9 %
Single Plane A4C EF: 55.6 %
Weight: 2832 [oz_av]

## 2023-11-20 MED ORDER — RIZATRIPTAN BENZOATE 10 MG PO TBDP: 10.0000 mg | ORAL_TABLET | ORAL | 2 refills | Status: DC | PRN

## 2023-11-20 MED ORDER — FLUCONAZOLE 150 MG PO TABS: ORAL_TABLET | ORAL | 0 refills | Status: DC

## 2023-11-20 NOTE — Progress Notes (Signed)
Subjective:    Patient ID: Kristin Parker, female    DOB: 21-Feb-1951, 73 y.o.   MRN: [id number]  Kristin Parker is a 73 y.o. female presenting on 11/20/2023 for Headache   HPI  Discussed the use of AI scribe software for clinical note transcription with the patient, who gave verbal consent to proceed.  History of Present Illness    Post Concussive Syndrome / Headaches Follow-up on recent fall / head injury Dizziness / Positional Vertigo  Last seen by me 08/2023, had fall and head traumatic injury, HA, vertigo, vision changes  She reported experiencing dark vision, waving curtains, and passing shadows, which were particularly noticeable in low light conditions. These visual disturbances were accompanied by a sensation of objects disappearing when she turned away and back. The patient sought an ophthalmological evaluation, which revealed no abnormalities in the eyes.  Seen Eye Doctor recently, no other vision issues, has lenses in for cataracts  Today gradual improvement now 2 months later. Initially had daily HAs, now has some very mild headache but not severe, and some days are worse, variable locations R vs L. -  Takes Tylenol 500mg  x 2 = 1000mg  and rest will help the headache - No further vertigo, but if bending forward and standing up can trigger dizzy  The patient's headaches were described as throbbing and variable in location, sometimes presenting on one side of the head, across the forehead, or at the back of the head. The frequency of these headaches has decreased over time, with some days presenting only mild headaches that do not interfere with daily activities. However, severe headaches still occur, with one instance so severe that it necessitated a visit to the emergency room. The patient reported that these headaches do not respond well to over-the-counter pain relief, such as Tylenol, and often require rest in a dark room for relief.  The patient also reported a recent  diagnosis of a yeast infection, which she has never experienced before. Over-the-counter treatments were ineffective in managing this condition.  The patient is currently on long-term medication for a heart condition, specifically Isosorbide (Imdur), which she has been taking for an extended period. She also recently started taking a Vitamin D3 and K2 supplement.  The patient is scheduled for a follow-up CT scan in March for emphysema lung evaluation also to investigate an opacity observed in the right lower lobe of the lung during a previous scan.            02/07/2023    3:04 PM 01/05/2022   10:29 AM 09/11/2021    2:19 PM  Depression screen PHQ 2/9  Decreased Interest 0 0 0  Down, Depressed, Hopeless 1 0 0  PHQ - 2 Score 1 0 0  Altered sleeping 1  1  Tired, decreased energy 1  0  Change in appetite 0  0  Feeling bad or failure about yourself  0  0  Trouble concentrating 1  0  Moving slowly or fidgety/restless 0  0  Suicidal thoughts 0  0  PHQ-9 Score 4  1  Difficult doing work/chores Not difficult at all  Somewhat difficult       09/11/2021    2:19 PM 03/22/2021    9:34 AM 03/08/2021    1:38 PM 09/08/2020   11:50 AM  GAD 7 : Generalized Anxiety Score  Nervous, Anxious, on Edge 1 3 1 1   Control/stop worrying 1 3 0 1  Worry too much - different things 1  3 1 1   Trouble relaxing 1 3 0 0  Restless 1 0 0 0  Easily annoyed or irritable 1 3 1 1   Afraid - awful might happen 1 3 1 1   Total GAD 7 Score 7 18 4 5   Anxiety Difficulty Not difficult at all Not difficult at all Not difficult at all Somewhat difficult    Social History   Tobacco Use   Smoking status: Former    Current packs/day: 0.00    Average packs/day: 0.5 packs/day for 30.0 years (15.0 ttl pk-yrs)    Types: Cigarettes    Start date: 10/30/1990    Quit date: 10/30/2020    Years since quitting: 3.0   Smokeless tobacco: Never   Tobacco comments:    Quit Jan 2022  Vaping Use   Vaping status: Former  Substance  Use Topics   Alcohol use: No    Alcohol/week: 0.0 standard drinks of alcohol   Drug use: No    Review of Systems Per HPI unless specifically indicated above     Objective:    BP (!) 142/74   Pulse 62   Ht 5\' 7"  (1.702 m)   Wt 177 lb (80.3 kg)   SpO2 96%   BMI 27.72 kg/m   Wt Readings from Last 3 Encounters:  11/20/23 177 lb (80.3 kg)  10/18/23 176 lb 3.2 oz (79.9 kg)  09/17/23 170 lb 12.8 oz (77.5 kg)    Physical Exam Vitals and nursing note reviewed.  Constitutional:      General: She is not in acute distress.    Appearance: She is well-developed. She is not diaphoretic.     Comments: Well-appearing, comfortable, cooperative  HENT:     Head: Normocephalic and atraumatic.  Eyes:     General:        Right eye: No discharge.        Left eye: No discharge.     Conjunctiva/sclera: Conjunctivae normal.  Neck:     Thyroid: No thyromegaly.  Cardiovascular:     Rate and Rhythm: Normal rate and regular rhythm.     Heart sounds: Normal heart sounds. No murmur heard. Pulmonary:     Effort: Pulmonary effort is normal. No respiratory distress.     Breath sounds: Normal breath sounds. No wheezing or rales.  Musculoskeletal:        General: Normal range of motion.     Cervical back: Normal range of motion and neck supple.  Lymphadenopathy:     Cervical: No cervical adenopathy.  Skin:    General: Skin is warm and dry.     Findings: No erythema or rash.  Neurological:     Mental Status: She is alert and oriented to person, place, and time.  Psychiatric:        Behavior: Behavior normal.     Comments: Well groomed, good eye contact, normal speech and thoughts     I have personally reviewed the radiology report from 09/11/23 on Head CT.  CLINICAL DATA:  Head trauma, minor (Age >= 65y) headache, vertigo following fall injury.   EXAM: CT HEAD WITHOUT CONTRAST   TECHNIQUE: Contiguous axial images were obtained from the base of the skull through the vertex without  intravenous contrast.   RADIATION DOSE REDUCTION: This exam was performed according to the departmental dose-optimization program which includes automated exposure control, adjustment of the mA and/or kV according to patient size and/or use of iterative reconstruction technique.   COMPARISON:  None Available.   FINDINGS:  Brain: No acute hemorrhage. Cortical gray-white differentiation is preserved. Patchy hypoattenuation of the periventricular white matter, most consistent with mild chronic small-vessel disease. Prominence of the ventricles and sulci within normal limits for age. No extra-axial collection. Basilar cisterns are patent.   Vascular: No hyperdense vessel or unexpected calcification.   Skull: No calvarial fracture or suspicious bone lesion. Skull base is unremarkable.   Sinuses/Orbits: No acute finding.   Other: None.   IMPRESSION: 1. No acute intracranial abnormality. 2. Mild chronic small-vessel disease.     Electronically Signed   By: Orvan Falconer M.D.   On: 09/11/2023 09:23   -----------------  ADDENDUM REPORT: 10/06/2023 10:09   ADDENDUM: OVER-READ INTERPRETATION  CT CHEST   The following report is an over-read performed by radiologist Dr. Oley Balm of [facility]. This over-read does not include interpretation of cardiac or coronary anatomy or pathology; that interpretation by cardiologist is attached.   No pleural or pericardial effusion.   Small hiatal hernia.  No visualized mediastinal mass or adenopathy.   Visualized upper abdomen unremarkable.   No pneumothorax. 29 x 11 mm pleural-based opacity in the posterolateral right lower lobe is new since most recent prior study of 01/11/2023. Minimal dependent atelectasis in both lungs.   Vertebral endplate spurring at multiple levels in the lower thoracic spine.   IMPRESSION: 1. New peripheral parenchymal opacity medially in the right lower lobe. Followup PA and lateral  chest X-ray is recommended in 3-4 weeks following trial of antibiotic therapy (if indicated) to ensure resolution and exclude underlying malignancy.     Electronically Signed   By: Corlis Leak M.D.   On: 10/06/2023 10:09  ----------------------------   CLINICAL DATA:  73 year old female with hazy in right lower lobe   EXAM: CHEST - 2 VIEW   COMPARISON:  12/31/2022, chest CT 09/19/2023   FINDINGS: Cardiomediastinal silhouette unchanged in size and contour. No evidence of central vascular congestion. No interlobular septal thickening.   No pneumothorax or pleural effusion.   Hazy opacity at the medial right lung base overlies the right heart border, correlating with findings on prior CT.   No acute displaced fracture. Degenerative changes of the spine.   IMPRESSION: Opacity at the right medial lung base, correlating with the findings on prior CT. Leading differential would be persisting infection. Either a follow-up chest x-ray after therapy would be recommended, or alternatively repeat chest CT in 3 months after the November chest CT to assure resolution.     Electronically Signed   By: Gilmer Mor D.O.   On: 10/31/2023 08:54   Results for orders placed or performed in visit on 09/11/23  Hemoglobin A1c   Collection Time: 09/11/23  8:44 AM  Result Value Ref Range   Hgb A1c MFr Bld 5.8 (H) <5.7 % of total Hgb   Mean Plasma Glucose 120 mg/dL   eAG (mmol/L) 6.6 mmol/L  Lipid panel   Collection Time: 09/11/23  8:44 AM  Result Value Ref Range   Cholesterol 237 (H) <200 mg/dL   HDL 38 (L) > OR = 50 mg/dL   Triglycerides 295 (H) <150 mg/dL   LDL Cholesterol (Calc) 154 (H) mg/dL (calc)   Total CHOL/HDL Ratio 6.2 (H) <5.0 (calc)   Non-HDL Cholesterol (Calc) 199 (H) <130 mg/dL (calc)  COMPLETE METABOLIC PANEL WITH GFR   Collection Time: 09/11/23  8:44 AM  Result Value Ref Range   Glucose, Bld 107 (H) 65 - 99 mg/dL   BUN 18 7 - 25 mg/dL  Creat 1.03 (H) 0.60 -  1.00 mg/dL   eGFR 57 (L) > OR = 60 mL/min/1.42m2   BUN/Creatinine Ratio 17 6 - 22 (calc)   Sodium 140 135 - 146 mmol/L   Potassium 4.4 3.5 - 5.3 mmol/L   Chloride 105 98 - 110 mmol/L   CO2 27 20 - 32 mmol/L   Calcium 9.7 8.6 - 10.4 mg/dL   Total Protein 7.0 6.1 - 8.1 g/dL   Albumin 4.5 3.6 - 5.1 g/dL   Globulin 2.5 1.9 - 3.7 g/dL (calc)   AG Ratio 1.8 1.0 - 2.5 (calc)   Total Bilirubin 0.4 0.2 - 1.2 mg/dL   Alkaline phosphatase (APISO) 97 37 - 153 U/L   AST 16 10 - 35 U/L   ALT 18 6 - 29 U/L  CBC with Differential/Platelet   Collection Time: 09/11/23  8:44 AM  Result Value Ref Range   WBC 9.4 3.8 - 10.8 Thousand/uL   RBC 5.00 3.80 - 5.10 Million/uL   Hemoglobin 15.2 11.7 - 15.5 g/dL   HCT 96.0 (H) 45.4 - 09.8 %   MCV 90.2 80.0 - 100.0 fL   MCH 30.4 27.0 - 33.0 pg   MCHC 33.7 32.0 - 36.0 g/dL   RDW 11.9 14.7 - 82.9 %   Platelets 312 140 - 400 Thousand/uL   MPV 10.2 7.5 - 12.5 fL   Neutro Abs 5,311 1,500 - 7,800 cells/uL   Absolute Lymphocytes 2,999 850 - 3,900 cells/uL   Absolute Monocytes 790 200 - 950 cells/uL   Eosinophils Absolute 216 15 - 500 cells/uL   Basophils Absolute 85 0 - 200 cells/uL   Neutrophils Relative % 56.5 %   Total Lymphocyte 31.9 %   Monocytes Relative 8.4 %   Eosinophils Relative 2.3 %   Basophils Relative 0.9 %  TSH   Collection Time: 09/11/23  8:44 AM  Result Value Ref Range   TSH 2.40 0.40 - 4.50 mIU/L  VITAMIN D 25 Hydroxy (Vit-D Deficiency, Fractures)   Collection Time: 09/11/23  8:44 AM  Result Value Ref Range   Vit D, 25-Hydroxy 24 (L) 30 - 100 ng/mL      Assessment & Plan:   Problem List Items Addressed This Visit   None Visit Diagnoses       Post concussion syndrome    -  Primary   Relevant Medications   rizatriptan (MAXALT-MLT) 10 MG disintegrating tablet     Episodic migraine       Relevant Medications   rizatriptan (MAXALT-MLT) 10 MG disintegrating tablet     Yeast vaginitis       Relevant Medications   fluconazole  (DIFLUCAN) 150 MG tablet         Post-Concussive Syndrome Headaches, possible migraine Persistent headaches with variable location and intensity, some visual disturbances, and improvement over time since the initial injury in November. No dizziness or vertigo.  -Start Rizatriptan disintegrating tablet for severe headaches, repeat dose within 2 hours if needed. -Consider referral to neurology if no improvement in next 1-3 months  Vaginal Yeast Infection New onset, unresponsive to over-the-counter Monistat. -Start Diflucan rx  Opacity on Right Lower Lung Lobe Identified incidentally on CT scan in November, follow-up chest x-ray in December suggested further follow-up. -CT scan scheduled with pulmonology in March.  Vitamin D Deficiency Patient taking Vitamin D3 with K2, wishes to continue supplementation. -Continue Vitamin D3 2000 units daily, with or without K2 as per patient preference.  Follow-up -Yearly Medicare nurse visit scheduled in April. -  CT scan with pulmonology in March. -If headaches persist, consider neurology referral.         No orders of the defined types were placed in this encounter.   Meds ordered this encounter  Medications   rizatriptan (MAXALT-MLT) 10 MG disintegrating tablet    Sig: Take 1 tablet (10 mg total) by mouth as needed for migraine. May repeat in 2 hours if needed    Dispense:  10 tablet    Refill:  2   fluconazole (DIFLUCAN) 150 MG tablet    Sig: Take one tablet by mouth on Day 1. Repeat dose 2nd tablet on Day 3.    Dispense:  2 tablet    Refill:  0    Follow up plan: Return if symptoms worsen or fail to improve.   [name], DO [facility] 11/20/2023, 2:09 PM

## 2023-11-20 NOTE — Patient Instructions (Addendum)
Thank you for coming to the office today.  Vitamin D3 2000 unit daily supplement  Upcoming CT Scan from Lung doctors in March 2025. Stay tuned, they can look at the shadow.  Try the Rizatriptan dissolving tablet for migraine headache, repeat dose within 2 hours if needed, 10 pills as needed.  If not improving 3+ months we can refer to Neurology for further.  Yeast infection- take Diflucan   Please schedule a Follow-up Appointment to: Return if symptoms worsen or fail to improve.  If you have any other questions or concerns, please feel free to call the office or send a message through MyChart. You may also schedule an earlier appointment if necessary.  Additionally, you may be receiving a survey about your experience at our office within a few days to 1 week by e-mail or mail. We value your feedback.  [name], DO [facility], [place]

## 2023-11-21 ENCOUNTER — Other Ambulatory Visit: Payer: Self-pay | Admitting: Internal Medicine

## 2023-11-21 ENCOUNTER — Other Ambulatory Visit: Payer: Self-pay | Admitting: Family Medicine

## 2023-11-21 DIAGNOSIS — I10 Essential (primary) hypertension: Secondary | ICD-10-CM

## 2023-11-22 NOTE — Telephone Encounter (Signed)
Requested Prescriptions  Pending Prescriptions Disp Refills   losartan (COZAAR) 100 MG tablet [Pharmacy Med Name: LOSARTAN POTASSIUM 100 MG TAB] 90 tablet 0    Sig: TAKE 1 TABLET BY MOUTH ONCE DAILY     Cardiovascular:  Angiotensin Receptor Blockers Failed - 11/22/2023  8:49 AM      Failed - Cr in normal range and within 180 days    Creat  Date Value Ref Range Status  09/11/2023 1.03 (H) 0.60 - 1.00 mg/dL Final         Failed - Last BP in normal range    BP Readings from Last 1 Encounters:  11/20/23 (!) 142/74         Passed - K in normal range and within 180 days    Potassium  Date Value Ref Range Status  09/11/2023 4.4 3.5 - 5.3 mmol/L Final         Passed - Patient is not pregnant      Passed - Valid encounter within last 6 months    Recent Outpatient Visits           2 days ago Post concussion syndrome   [place] [facility] [place], Netta Neat, DO   2 months ago Annual physical exam   [place] [facility] Smitty Cords, DO   7 months ago Chronic anxiety   [place] [facility] Smitty Cords, [place]   11 months ago Chronic anxiety   [place] [facility] Smitty Cords, DO   1 year ago Chronic anxiety   [place] [facility] [place], Netta Neat, DO       Future Appointments             In 4 days Daine Floras, Gavin Pound, NP [facility] [place]   In 3 months Raechel Chute, MD [facility] at [facility]

## 2023-11-24 NOTE — Progress Notes (Unsigned)
Cardiology Clinic Note   Date: 11/26/2023 ID: Kristin Parker 03-09-1951, MRN [id number]  Primary Cardiologist:  Yvonne Kendall, MD  Patient Profile    Kristin Parker is a 73 y.o. female who presents to the clinic today for follow up after testing.     Past medical history significant for: Mild to moderate CAD. LHC 06/25/2017 (abnormal stress test): Distal LM 40%.  Ostial LAD 20%.  Mid LAD 20 to 30%.  Proximal and mid LCx 30%.  Recommend medical therapy and aggressive risk factor modification. Coronary CTA 09/19/2023: Coronary calcium score 215 (75th percentile).  Mild stenosis proximal LAD and LCx.  Mild nonobstructive CAD. Dyspnea. Echo 11/20/2023: EF 55 to 60%.  No RWMA.  Mild LVH.  Grade I DD.  Normal RV size/function  Normal PA pressure, RVSP 17.2 mmHg.  Aortic valve calcification/sclerosis without stenosis. Carotid artery stenosis/left subclavian artery stenosis.   Carotid duplex 11/20/2023: Bilateral ICA 1 to 39%.  Left subclavian artery flow was disturbed. Mitral regurgitation. Hypertension. Hyperlipidemia. Lipid panel 09/11/2023: LDL 154, HDL 38, TG 272, total 237. COPD. Former tobacco abuse.  In summary, patient was first evaluated by Dr. Okey Dupre on 01/24/2017 for heart murmur and carotid bruit at the request of Dr. Juanetta Gosling.  Carotid duplex showed mild to moderate stenosis of bilateral carotids.  Echo showed normal LV function, Grade I DD, mild MR, aortic valve sclerosis without stenosis.  In August 2018 patient complained of stable DOE, nocturnal chest pain, and fatigue.  Exercise stress test showed decreased exercise capacity with hypertensive response and failure to achieve target heart rate.  1.5 horizontal ST depressions noted in II, III, and aVF at peak stress and early recovery.  LHC showed mild to moderate CAD with recommendation for medical therapy and aggressive risk factor modification.      History of Present Illness    Kristin Parker is followed by Dr. Okey Dupre for the  above outlined history.  Patient was last seen in the office on 09/17/2023 by me for evaluation of dyspnea and chest tightness.  Symptoms were initially occurring with heavy exertion but over a 2-week period began occurring with less exertion.  She also reported palpitations described as heart racing with exertion or slow pounding at rest occasionally accompanied by chest tightness or dyspnea.  Further evaluation of palpitations was deferred in favor of scheduling echo and coronary CTA.  Echo was essentially unchanged with normal LV/RV function as detailed above.  Carotid ultrasound was updated and showed stable bilateral ICA stenosis 1 to 39%.  Coronary CTA demonstrated calcium score of 215 with mild nonobstructive CAD.  Coronary CTA showed incidental finding of new peripheral parenchymal opacity medially in the right lower lobe with recommendation for chest x-ray.  She underwent x-ray in December 2024 showing centimeters opacity in right medial lung with bleeding differential persistent infection and recommendation for follow-up with chest x-ray or chest CT.  Patient was instructed to follow-up with PCP.  Today, patient reports she is overall doing well. She continues to have some episodes of chest tightness but feels it is related to anxiety. Dyspnea has continued as well. She is followed by pulmonary and does not feel inhalers are working. She is following up with them soon and is pending Chest CT in March. Patient reports occasional mild lower extremity "puffiness" that is normal in the morning and progresses throughout the day. She denies orthopnea or PND. She reports palpitations have improved since last visit. She will occasionally feel a very brief flutter.  She is not sure if it is related to anxiety but now that it is not happening as much it does not really bother her. Discussed results of CTA and echo in detail. All questions answered. She is working on improving her eating habits.      ROS: All  other systems reviewed and are otherwise negative except as noted in History of Present Illness.  EKGs/Labs Reviewed       EKG not ordered today.   09/11/2023: ALT 18; AST 16; BUN 18; Creat 1.03; Potassium 4.4; Sodium 140   09/11/2023: Hemoglobin 15.2; WBC 9.4   09/11/2023: TSH 2.40    Physical Exam    VS:  BP 136/74 (Cuff Size: Normal)   Pulse 62   Ht 5\' 7"  (1.702 m)   Wt 175 lb (79.4 kg)   SpO2 90%   BMI 27.41 kg/m  , BMI Body mass index is 27.41 kg/m.  GEN: Well nourished, well developed, in no acute distress. Neck: No JVD or carotid bruits. Cardiac:  RRR. No murmurs. No rubs or gallops.   Respiratory:  Respirations regular and unlabored. Clear to auscultation without rales, wheezing or rhonchi. GI: Soft, nontender, nondistended. Extremities: Radials/DP/PT 2+ and equal bilaterally. No clubbing or cyanosis. No edema.  Skin: Warm and dry, no rash. Neuro: Strength intact.  Assessment & Plan   Palpitations Patient reports palpitations have improved and are not happening frequently. She reports occasionally feeling a brief flutter and then it resolves. This is not as bothersome to her.  -No further testing indicated at this time.  -She will contact the office if palpitations increase in frequency or intensity.    CAD/Chest tightness LHC August 2018 showed mild to moderate CAD with recommendation for medical management.  Coronary CTA November 2024 showed calcium score 215, mild nonobstructive CAD.  Patient reports continued episodes of chest tightness that she relates to anxiety. Discussed results of CTA in detail and all questions answered.  -Continue amlodipine, isosorbide, aspirin, Repatha.   Chronic dyspnea Echo January 2025 showed normal LV/RV function, Grade I DD trivial MR, mild aortic valve sclerosis without stenosis.  Incidental finding of right lower medial lobe opacity on coronary CTA.  Follow-up chest x-ray showed same findings.  Patient is pending CT chest in  March 2025 ordered by pulmonary.  Patient reports continued dyspnea. She has an upcoming visit with pulmonary soon. Discussed results of echo in detail. All questioned answered.  -Follow with pulmonology.    Hypertension BP today 136/74. Patient reports positional dizziness has resolved since being treated for vertigo.  -Continue losartan, amlodipine, isosorbide.   Carotid artery disease Carotid duplex January 2025 showed bilateral ICA 1-39%. She reports positional dizziness has improved since being treated for vertigo.  -Repeat ultrasound as clinically indicated.   Hyperlipidemia LDL November 2024 154, not at goal.  Patient stated she did not miss any doses of Repatha at her last visit, however she thought about it further and realized she had missed a dose when she depressed the medication before inserting the needle. She also "misfired" a dose and it went into her finger. She will have PCP repeat lipid panel.  -Continue Repatha.  Disposition: Return in 1 year or sooner as needed.    Signed, Etta Grandchild. Branton Einstein, DNP, NP-C

## 2023-11-26 ENCOUNTER — Ambulatory Visit: Payer: Medicare PPO | Attending: Student | Admitting: Student

## 2023-11-26 ENCOUNTER — Encounter: Payer: Self-pay | Admitting: Student

## 2023-11-26 VITALS — BP 136/74 | HR 62 | Ht 67.0 in | Wt 175.0 lb

## 2023-11-26 DIAGNOSIS — I10 Essential (primary) hypertension: Secondary | ICD-10-CM

## 2023-11-26 DIAGNOSIS — E785 Hyperlipidemia, unspecified: Secondary | ICD-10-CM

## 2023-11-26 DIAGNOSIS — R002 Palpitations: Secondary | ICD-10-CM

## 2023-11-26 DIAGNOSIS — I2510 Atherosclerotic heart disease of native coronary artery without angina pectoris: Secondary | ICD-10-CM | POA: Diagnosis not present

## 2023-11-26 DIAGNOSIS — R0602 Shortness of breath: Secondary | ICD-10-CM | POA: Diagnosis not present

## 2023-11-26 DIAGNOSIS — R0789 Other chest pain: Secondary | ICD-10-CM | POA: Diagnosis not present

## 2023-11-26 DIAGNOSIS — I6523 Occlusion and stenosis of bilateral carotid arteries: Secondary | ICD-10-CM

## 2023-11-26 NOTE — Patient Instructions (Signed)
Medication Instructions:  Your Physician recommend you continue on your current medication as directed.    *If you need a refill on your cardiac medications before your next appointment, please call your pharmacy*   Lab Work: None ordered at this time   Follow-Up: At [facility], you and your health needs are our priority.  As part of our continuing mission to provide you with exceptional heart care, we have created designated Provider Care Teams.  These Care Teams include your primary Cardiologist (physician) and Advanced Practice Providers (APPs -  Physician Assistants and Nurse Practitioners) who all work together to provide you with the care you need, when you need it.  We recommend signing up for the patient portal called "MyChart".  Sign up information is provided on this After Visit Summary.  MyChart is used to connect with patients for Virtual Visits (Telemedicine).  Patients are able to view lab/test results, encounter notes, upcoming appointments, etc.  Non-urgent messages can be sent to your provider as well.   To learn more about what you can do with MyChart, go to [email or web address].    Your next appointment:   1 year(s)  Provider:   You may see Yvonne Kendall, MD or one of the following Advanced Practice Providers on your designated Care Team:   Nicolasa Ducking, NP Eula Listen, PA-C Cadence Fransico Michael, PA-C Charlsie Quest, NP Carlos Levering, NP

## 2023-11-27 ENCOUNTER — Ambulatory Visit: Payer: Self-pay

## 2023-11-27 DIAGNOSIS — B3731 Acute candidiasis of vulva and vagina: Secondary | ICD-10-CM

## 2023-11-27 NOTE — Telephone Encounter (Signed)
Can you clarify with her what her request is?  I prescribed Diflucan Fluconazole for the symptoms of vaginal yeast infection.  Is she saying it did not work and wants something else? There is not a great option available for other treatments for yeast infection to be honest.  I would not prescribe antibiotics because this does not sound bacterial and that would worsen yeast.  Or did it help some but she just needs a repeat course of Fluconazole?  Thanks  [name], DO [facility] 11/27/2023, 1:18 PM

## 2023-11-27 NOTE — Telephone Encounter (Signed)
Summary: yeast infection not improving   Caller states the antibiotics prescribed for her yeast infection is not working. Caller states she's experiencing severe itching and burning. Caller states the outside area is raw and would like PCP to prescribe another round of anabiotics.  Caller requesting a call back prior to 1pm if not she will stop bu the office     Chief Complaint: Pt. Finished Diflucan, "It helped some but I still have burning and itching outside the vagina." No discharge. Asking foe additional medication. Symptoms: Above Frequency: 11/20/23 Pertinent Negatives: Patient denies  Disposition: [] ED /[] Urgent Care (no appt availability in office) / [] Appointment(In office/virtual)/ []  [place] [facility]/ [] Home Care/ [x] Refused Recommended Disposition /[] [place] [facility]/ [x]  Follow-up with PCP Additional Notes: Please advise pt.  Reason for Disposition  [1] Symptoms of a yeast infection (i.e., itchy, white discharge, not bad smelling) AND [2] not improved > 3 days following Care Advice  Answer Assessment - Initial Assessment Questions 1. SYMPTOM: "What's the main symptom you're concerned about?" (e.g., pain, itching, dryness)     Itching, burning, raw 2. LOCATION: "Where is the   located?" (e.g., inside/outside, left/right)     Outside 3. ONSET: "When did the    start?"     11/20/23 4. PAIN: "Is there any pain?" If Yes, ask: "How bad is it?" (Scale: 1-10; mild, moderate, severe)   -  MILD (1-3): Doesn't interfere with normal activities.    -  MODERATE (4-7): Interferes with normal activities (e.g., work or school) or awakens from sleep.     -  SEVERE (8-10): Excruciating pain, unable to do any normal activities.     Moderate 5. ITCHING: "Is there any itching?" If Yes, ask: "How bad is it?" (Scale: 1-10; mild, moderate, severe)     Moderate 6. CAUSE: "What do you think is causing the discharge?" "Have you had the same problem before? What happened then?"     Yeast 7.  OTHER SYMPTOMS: "Do you have any other symptoms?" (e.g., fever, itching, vaginal bleeding, pain with urination, injury to genital area, vaginal foreign body)     Above 8. PREGNANCY: "Is there any chance you are pregnant?" "When was your last menstrual period?"     No  Protocols used: Vaginal Symptoms-A-AH

## 2023-11-27 NOTE — Telephone Encounter (Signed)
LM for patient to return my call to gather more information

## 2023-11-28 ENCOUNTER — Other Ambulatory Visit: Payer: Self-pay | Admitting: Internal Medicine

## 2023-11-28 ENCOUNTER — Other Ambulatory Visit: Payer: Self-pay | Admitting: Family Medicine

## 2023-11-28 DIAGNOSIS — I25118 Atherosclerotic heart disease of native coronary artery with other forms of angina pectoris: Secondary | ICD-10-CM

## 2023-11-28 DIAGNOSIS — E785 Hyperlipidemia, unspecified: Secondary | ICD-10-CM

## 2023-11-28 DIAGNOSIS — F419 Anxiety disorder, unspecified: Secondary | ICD-10-CM

## 2023-11-28 DIAGNOSIS — L299 Pruritus, unspecified: Secondary | ICD-10-CM

## 2023-11-28 DIAGNOSIS — I10 Essential (primary) hypertension: Secondary | ICD-10-CM

## 2023-11-28 MED ORDER — FLUCONAZOLE 150 MG PO TABS: 150.0000 mg | ORAL_TABLET | ORAL | 0 refills | Status: DC

## 2023-11-28 NOTE — Telephone Encounter (Signed)
Sent rx Fluconazole 150mg  take very other day, for 4 pills this time for longer course.  I am not sure what other med options would be successful. Like I mentioned, it does not sound like UTI or bacterial.  If still unresolved, would likely need a vaginal swab test next.  [name], DO [facility] 11/28/2023, 3:17 PM

## 2023-11-28 NOTE — Addendum Note (Signed)
Addended by: Smitty Cords on: 11/28/2023 03:17 PM   Modules accepted: Orders

## 2023-11-28 NOTE — Telephone Encounter (Signed)
Refill Request.

## 2023-11-29 NOTE — Telephone Encounter (Signed)
Requested Prescriptions  Pending Prescriptions Disp Refills   sertraline (ZOLOFT) 100 MG tablet [Pharmacy Med Name: SERTRALINE HCL 100 MG TAB] 90 tablet 1    Sig: TAKE 1 TABLET BY MOUTH ONCE DAILY     Psychiatry:  Antidepressants - SSRI - sertraline Passed - 11/29/2023  1:46 PM      Passed - AST in normal range and within 360 days    AST  Date Value Ref Range Status  09/11/2023 16 10 - 35 U/L Final         Passed - ALT in normal range and within 360 days    ALT  Date Value Ref Range Status  09/11/2023 18 6 - 29 U/L Final         Passed - Completed PHQ-2 or PHQ-9 in the last 360 days      Passed - Valid encounter within last 6 months    Recent Outpatient Visits           1 week ago Post concussion syndrome   [place] [facility] [place], Netta Neat, [place]   2 months ago Annual physical exam   [place] [facility] Smitty Cords, [place]   7 months ago Chronic anxiety   [place] [facility] Smitty Cords, [place]   11 months ago Chronic anxiety   [place] [facility] Smitty Cords, DO   1 year ago Chronic anxiety   [place] [facility] Smitty Cords, DO       Future Appointments             In 3 months Raechel Chute, MD [facility] at [facility]             hydrOXYzine (VISTARIL) 25 MG capsule [Pharmacy Med Name: HYDROXYZINE PAMOATE 25 MG CAP] 90 capsule 2    Sig: TAKE 1 CAPSULE BY MOUTH EVERY 8 HOURS ASNEEDED     Ear, Nose, and Throat:  Antihistamines 2 Failed - 11/29/2023  1:46 PM      Failed - Cr in normal range and within 360 days    Creat  Date Value Ref Range Status  09/11/2023 1.03 (H) 0.60 - 1.00 mg/dL Final         Passed - Valid encounter within last 12 months    Recent Outpatient Visits           1 week ago Post concussion syndrome   [place] [facility], Netta Neat, DO   2 months ago Annual physical exam   [place] [facility] Smitty Cords, DO   7 months ago Chronic anxiety   [facility] Smitty Cords, [place]   11 months ago Chronic anxiety   [place] [facility] Smitty Cords, DO   1 year ago Chronic anxiety   [place] [facility] Smitty Cords, DO       Future Appointments             In 3 months Raechel Chute, MD [facility] [place] [facility] at [facility]             amLODipine (NORVASC) 10 MG tablet [Pharmacy Med Name: AMLODIPINE BESYLATE 10 MG TAB] 90 tablet 1    Sig: TAKE 1 TABLET BY MOUTH ONCE DAILY     Cardiovascular: Calcium Channel Blockers 2 Passed -  11/29/2023  1:46 PM      Passed - Last BP in normal range    BP Readings from Last 1 Encounters:  11/26/23 136/74         Passed - Last Heart Rate in normal range    Pulse Readings from Last 1 Encounters:  11/26/23 62         Passed - Valid encounter within last 6 months    Recent Outpatient Visits           1 week ago Post concussion syndrome   [place] [facility] [place], Netta Neat, DO   2 months ago Annual physical exam   [place] [facility] Smitty Cords, DO   7 months ago Chronic anxiety   [place] [facility] Smitty Cords, [place]   11 months ago Chronic anxiety   [place] [facility] Smitty Cords, DO   1 year ago Chronic anxiety   [place] [facility] Smitty Cords, DO       Future Appointments             In 3 months Raechel Chute, MD [facility]

## 2023-12-10 ENCOUNTER — Other Ambulatory Visit: Payer: Self-pay | Admitting: Internal Medicine

## 2023-12-16 ENCOUNTER — Other Ambulatory Visit (INDEPENDENT_AMBULATORY_CARE_PROVIDER_SITE_OTHER): Payer: Medicare PPO

## 2023-12-16 DIAGNOSIS — R300 Dysuria: Secondary | ICD-10-CM

## 2023-12-17 ENCOUNTER — Ambulatory Visit: Payer: Medicare PPO | Admitting: Family Medicine

## 2023-12-17 ENCOUNTER — Encounter: Payer: Self-pay | Admitting: Family Medicine

## 2023-12-17 VITALS — BP 130/68 | HR 67 | Ht 67.0 in | Wt 174.0 lb

## 2023-12-17 DIAGNOSIS — R300 Dysuria: Secondary | ICD-10-CM

## 2023-12-17 DIAGNOSIS — M25511 Pain in right shoulder: Secondary | ICD-10-CM | POA: Diagnosis not present

## 2023-12-17 DIAGNOSIS — I10 Essential (primary) hypertension: Secondary | ICD-10-CM

## 2023-12-17 DIAGNOSIS — I25118 Atherosclerotic heart disease of native coronary artery with other forms of angina pectoris: Secondary | ICD-10-CM

## 2023-12-17 DIAGNOSIS — N952 Postmenopausal atrophic vaginitis: Secondary | ICD-10-CM

## 2023-12-17 DIAGNOSIS — G8929 Other chronic pain: Secondary | ICD-10-CM | POA: Diagnosis not present

## 2023-12-17 LAB — POCT URINALYSIS DIPSTICK
Blood, UA: NEGATIVE
Glucose, UA: NEGATIVE
Ketones, UA: NEGATIVE
Leukocytes, UA: NEGATIVE
Nitrite, UA: NEGATIVE
Odor: POSITIVE
Protein, UA: NEGATIVE
Spec Grav, UA: 1.025 (ref 1.010–1.025)
Urobilinogen, UA: 0.2 U/dL
pH, UA: 5 (ref 5.0–8.0)

## 2023-12-17 MED ORDER — LOSARTAN POTASSIUM 100 MG PO TABS: 100.0000 mg | ORAL_TABLET | Freq: Every day | ORAL | 1 refills | Status: DC

## 2023-12-17 MED ORDER — ESTRADIOL 0.1 MG/GM VA CREA: TOPICAL_CREAM | VAGINAL | 5 refills | Status: AC

## 2023-12-17 NOTE — Progress Notes (Unsigned)
Subjective:    Patient ID: Kristin Parker, female    DOB: [birth year], 73 y.o.   MRN: [id number]  Kristin Parker is a 73 y.o. female presenting on 12/17/2023 for Vaginitis and Coronary Artery Disease   HPI  Discussed the use of AI scribe software for clinical note transcription with the patient, who gave verbal consent to proceed.  History of Present Illness   Kristin Parker is a 73 year old female who presents with urinary odor and genital itching.  She has been experiencing a strong odor in her urine, which she noticed after starting vitamin K and D supplements. She continues to take them as she feels better overall. Urinalysis was negative for blood, white blood cells, and inflammation, indicating no infection.  She experiences itching and burning in the genital area, particularly when urinating, as the urine contacts the skin. A longer course of antifungal treatment provided only partial relief. She describes the area as 'dry' and notes that the burning occurs externally. She has not experienced a urinary tract infection before.  Coronary Artery Disease History of Heart Disease on prior Coronary Scan 08/2023 per Cardiology showing Mild non obstructive CAD.  Admits concern with central adiposity. Her weight is concentrated in her abdomen, affecting her breathing and ability to exercise. She has tried to eat healthily and has lost a few pounds, but her breathing limitations prevent her from engaging in extensive physical activity.  She has a history of Right shoulder issues, including moderate to severe arthritis of the ball and socket joint and tendonitis in the rotator cuff, diagnosed in 2019. Her shoulder condition has worsened over time, with bursitis on the top and arthritis on the bottom of the shoulder. She manages the pain with exercises and topical treatments like Tiger Balm, which she finds effective despite its odor.         02/07/2023    3:04 PM 01/05/2022   10:29 AM  09/11/2021    2:19 PM  Depression screen PHQ 2/9  Decreased Interest 0 0 0  Down, Depressed, Hopeless 1 0 0  PHQ - 2 Score 1 0 0  Altered sleeping 1  1  Tired, decreased energy 1  0  Change in appetite 0  0  Feeling bad or failure about yourself  0  0  Trouble concentrating 1  0  Moving slowly or fidgety/restless 0  0  Suicidal thoughts 0  0  PHQ-9 Score 4  1  Difficult doing work/chores Not difficult at all  Somewhat difficult       09/11/2021    2:19 PM 03/22/2021    9:34 AM 03/08/2021    1:38 PM 09/08/2020   11:50 AM  GAD 7 : Generalized Anxiety Score  Nervous, Anxious, on Edge 1 3 1 1   Control/stop worrying 1 3 0 1  Worry too much - different things 1 3 1 1   Trouble relaxing 1 3 0 0  Restless 1 0 0 0  Easily annoyed or irritable 1 3 1 1   Afraid - awful might happen 1 3 1 1   Total GAD 7 Score 7 18 4 5   Anxiety Difficulty Not difficult at all Not difficult at all Not difficult at all Somewhat difficult    Social History   Tobacco Use   Smoking status: Former    Current packs/day: 0.00    Average packs/day: 0.5 packs/day for 30.0 years (15.0 ttl pk-yrs)    Types: Cigarettes    Start date: 10/30/1990  Quit date: 10/30/2020    Years since quitting: 3.1   Smokeless tobacco: Never   Tobacco comments:    Quit Jan 2022  Vaping Use   Vaping status: Former  Substance Use Topics   Alcohol use: No    Alcohol/week: 0.0 standard drinks of alcohol   Drug use: No    Review of Systems Per HPI unless specifically indicated above     Objective:    BP 130/68   Pulse 67   Ht 5\' 7"  (1.702 m)   Wt 174 lb (78.9 kg)   SpO2 94%   BMI 27.25 kg/m   Wt Readings from Last 3 Encounters:  12/17/23 174 lb (78.9 kg)  11/26/23 175 lb (79.4 kg)  11/20/23 177 lb (80.3 kg)    Physical Exam Vitals and nursing note reviewed.  Constitutional:      General: She is not in acute distress.    Appearance: Normal appearance. She is well-developed. She is not diaphoretic.     Comments:  Well-appearing, comfortable, cooperative  HENT:     Head: Normocephalic and atraumatic.  Eyes:     General:        Right eye: No discharge.        Left eye: No discharge.     Conjunctiva/sclera: Conjunctivae normal.  Cardiovascular:     Rate and Rhythm: Normal rate.  Pulmonary:     Effort: Pulmonary effort is normal.  Musculoskeletal:     Comments: Reduced ROM right shoulder, positive impingement pain  Skin:    General: Skin is warm and dry.     Findings: No erythema or rash.  Neurological:     Mental Status: She is alert and oriented to person, place, and time.  Psychiatric:        Mood and Affect: Mood normal.        Behavior: Behavior normal.        Thought Content: Thought content normal.     Comments: Well groomed, good eye contact, normal speech and thoughts     I have personally reviewed the radiology report from 07/15/18 R Shoulder MRI.  CLINICAL DATA:  Right shoulder pain for years. Getting worse over the past year.   EXAM: MRI OF THE RIGHT SHOULDER WITHOUT CONTRAST   TECHNIQUE: Multiplanar, multisequence MR imaging of the shoulder was performed. No intravenous contrast was administered.   COMPARISON:  None.   FINDINGS: Rotator cuff: Mild tendinosis of the supraspinatus tendon. Infraspinatus tendon is intact. Teres minor tendon is intact. Subscapularis tendon is intact.   Muscles: No atrophy or fatty replacement of nor abnormal signal within, the muscles of the rotator cuff.   Biceps long head: Moderate tendinosis of the intra-articular portion of the long head of the biceps tendon.   Acromioclavicular Joint: Moderate arthropathy of the acromioclavicular joint. Type I acromion. No subacromial/subdeltoid bursal fluid.   Glenohumeral Joint: Small joint effusion. High-grade partial-thickness cartilage loss of the glenohumeral joint with areas of full-thickness cartilage loss of the posterior glenoid with subchondral cystic changes. Areas of  full-thickness cartilage loss of the posterosuperior humeral head with sub chondral reactive marrow changes. Bulky inferior humeral marginal osteophyte.   Labrum:  Superior and posterior labral degeneration.   Bones:  No acute osseous abnormality.  No aggressive osseous lesion.   Other: No fluid collection or hematoma.   IMPRESSION: 1. Moderate-severe osteoarthritis of the right glenohumeral joint. 2. Mild tendinosis of the supraspinatus tendon. 3. Moderate tendinosis of the intra-articular portion of the long head of  the biceps tendon.     Electronically Signed   By: Elige Ko   On: 07/15/2018 11:12   Results for orders placed or performed in visit on 12/16/23  POCT Urinalysis Dipstick   Collection Time: 12/17/23  1:56 PM  Result Value Ref Range   Color, UA yellow    Clarity, UA clear    Glucose, UA Negative Negative   Bilirubin, UA small    Ketones, UA negative    Spec Grav, UA 1.025 1.010 - 1.025   Blood, UA negative    pH, UA 5.0 5.0 - 8.0   Protein, UA Negative Negative   Urobilinogen, UA 0.2 0.2 or 1.0 E.U./dL   Nitrite, UA negative    Leukocytes, UA Negative Negative   Appearance     Odor positive       Assessment & Plan:   Problem List Items Addressed This Visit     Chronic right shoulder pain   Coronary artery disease of native artery of native heart with stable angina pectoris (HCC)   Relevant Medications   losartan (COZAAR) 100 MG tablet   WEGOVY 0.25 MG/0.5ML SOAJ   Essential hypertension   Relevant Medications   losartan (COZAAR) 100 MG tablet   Other Visit Diagnoses       Atrophic vaginitis    -  Primary   Relevant Medications   estradiol (ESTRACE) 0.1 MG/GM vaginal cream        Vulvovaginal Atrophy Complaints of itching, burning, and odor. Urinalysis negative for infection. Discussed the possibility of atrophy due to reduced hormone levels causing dryness and irritation. -Prescribe topical vaginal estrogen cream (1 application every  night for 2 weeks, then 3 times a week for maintenance). -Review progress in 6 months.  Chronic R Shoulder Pain History of moderate to severe arthritis glenohumeral and tendonitis of the rotator cuff. Patient reports worsening pain and limited range of motion despite exercises. -Consider referral to orthopedic specialist in the spring per patient's preference.  Coronary Artery Disease -Explore possibility of GLP1 therapy for benefit of CAD risk reduction. -Indicated for Wegovy 0.25mg  weekly inj, dose titration as discussed Will pursue PA  Vitamin D Supplementation Patient has been self-supplementing with over-the-counter Vitamin D. Discussed the possibility of side effects and the potential need for maintenance dosing. -Advise patient to continue current regimen if feeling well and not experiencing side effects.  ____________________________________________________ Additional Rx Information (May be used for Prior Authorization if required)  Medication name and Strength: Wegovy 0.25 mg  Primary Diagnosis and ICD10 Code: Coronary Artery Disease (I25.118) Secondary Diagnosis and ICD10 Code: n/a  Previous Failed Medications None Quantity and Duration of New Medication: 2 mL for 30 days Additional Supporting Information: ____________________________________________________   No orders of the defined types were placed in this encounter.   Meds ordered this encounter  Medications   estradiol (ESTRACE) 0.1 MG/GM vaginal cream    Sig: Apply 1 gram per vagina every night for 2 weeks, then apply three times a week    Dispense:  42.5 g    Refill:  5   losartan (COZAAR) 100 MG tablet    Sig: Take 1 tablet (100 mg total) by mouth daily.    Dispense:  90 tablet    Refill:  1   WEGOVY 0.25 MG/0.5ML SOAJ    Sig: Inject 0.25 mg into the skin once a week.    Dispense:  2 mL    Refill:  0    Follow up plan: Return if symptoms  worsen or fail to improve.    [name],  DO [facility] 12/17/2023, 2:18 PM

## 2023-12-17 NOTE — Patient Instructions (Addendum)
Thank you for coming to the office today.  Order Estrogen topical cream to see if this will help resolve issue with vaginal tissue.  Likely some thinning and dry tissue and odor related to age related reduced hormones  This may help resolve the tissue, itching, burning etc.  We can refer to Orthopedic in future if when ready to pursue for Right Shoulder  ---------------------------------------      Please schedule a Follow-up Appointment to: Return if symptoms worsen or fail to improve.  If you have any other questions or concerns, please feel free to call the office or send a message through MyChart. You may also schedule an earlier appointment if necessary.  Additionally, you may be receiving a survey about your experience at our office within a few days to 1 week by e-mail or mail. We value your feedback.  [name], DO [facility], [place]

## 2023-12-18 MED ORDER — WEGOVY 0.25 MG/0.5ML ~~LOC~~ SOAJ: 0.2500 mg | SUBCUTANEOUS | 0 refills | Status: DC

## 2023-12-19 NOTE — Progress Notes (Addendum)
PA started   [employer] (Key: [facility]) Rx #: [id number] [phone] 0.25MG /0.5ML auto-injectors Form [employer] Electronic PA Form  Sent to plan with Dr. Althea Charon last OV note and cardiology last OV note   Approved 12/19/23-10/28/24

## 2023-12-23 ENCOUNTER — Other Ambulatory Visit: Payer: Self-pay

## 2023-12-23 ENCOUNTER — Encounter: Payer: Medicare PPO | Attending: Student in an Organized Health Care Education/Training Program

## 2023-12-23 DIAGNOSIS — Z5189 Encounter for other specified aftercare: Secondary | ICD-10-CM | POA: Insufficient documentation

## 2023-12-23 DIAGNOSIS — Z87891 Personal history of nicotine dependence: Secondary | ICD-10-CM | POA: Insufficient documentation

## 2023-12-23 DIAGNOSIS — J439 Emphysema, unspecified: Secondary | ICD-10-CM | POA: Insufficient documentation

## 2023-12-23 NOTE — Progress Notes (Signed)
 Virtual Visit completed. Patient informed on EP and RD appointment and 6 Minute walk test. Patient also informed of patient health questionnaires on My Chart. Patient Verbalizes understanding. Visit diagnosis can be found in [facility] 10/18/2023.

## 2023-12-25 ENCOUNTER — Other Ambulatory Visit: Payer: Self-pay | Admitting: Internal Medicine

## 2023-12-25 DIAGNOSIS — E785 Hyperlipidemia, unspecified: Secondary | ICD-10-CM

## 2023-12-25 DIAGNOSIS — I25118 Atherosclerotic heart disease of native coronary artery with other forms of angina pectoris: Secondary | ICD-10-CM

## 2023-12-26 VITALS — Ht 65.7 in | Wt 177.2 lb

## 2023-12-26 DIAGNOSIS — Z5189 Encounter for other specified aftercare: Secondary | ICD-10-CM | POA: Diagnosis not present

## 2023-12-26 DIAGNOSIS — Z87891 Personal history of nicotine dependence: Secondary | ICD-10-CM | POA: Diagnosis not present

## 2023-12-26 DIAGNOSIS — J439 Emphysema, unspecified: Secondary | ICD-10-CM

## 2023-12-26 NOTE — Progress Notes (Signed)
 Pulmonary Individual Treatment Plan  Patient Details  Name: Kristin Parker MRN: [id number] Date of Birth: June 01, 1951 Referring Provider:   Flowsheet Row Pulmonary Rehab from 12/26/2023 in [facility] Cardiac and Pulmonary Rehab  Referring Provider Raechel Chute, MD       Initial Encounter Date:  Flowsheet Row Pulmonary Rehab from 12/26/2023 in [facility] Cardiac and Pulmonary Rehab  Date 12/26/23       Visit Diagnosis: Pulmonary emphysema, unspecified emphysema type (HCC)  Patient's Home Medications on Admission:  Current Outpatient Medications:    REPATHA SURECLICK 140 MG/ML SOAJ, INJECT 1 DOSE INTO THE SKIN EVERY 14 DAYS, Disp: 6 mL, Rfl: 0   acetaminophen (TYLENOL) 500 MG tablet, Take 2 tablets (1,000 mg total) by mouth every 6 (six) hours as needed for mild pain., Disp: , Rfl:    acetic acid-hydrocortisone (VOSOL-HC) OTIC solution, Place 3 drops into both ears 3 (three) times daily. May use wick for first 24 hours (Patient not taking: Reported on 12/23/2023), Disp: 10 mL, Rfl: 0   albuterol (VENTOLIN HFA) 108 (90 Base) MCG/ACT inhaler, INHALE 2 PUFFS EVERY 4 HOURS FOR WHEEZING OR SHORTNESS OF BREATH AS NEEDED, Disp: 18 g, Rfl: 3   amLODipine (NORVASC) 10 MG tablet, TAKE 1 TABLET BY MOUTH ONCE DAILY, Disp: 90 tablet, Rfl: 1   aspirin EC 81 MG tablet, Take 1 tablet (81 mg total) by mouth daily., Disp: , Rfl:    busPIRone (BUSPAR) 5 MG tablet, Take 1 tablet (5 mg total) by mouth 2 (two) times daily., Disp: 180 tablet, Rfl: 3   estradiol (ESTRACE) 0.1 MG/GM vaginal cream, Apply 1 gram per vagina every night for 2 weeks, then apply three times a week, Disp: 42.5 g, Rfl: 5   famotidine (PEPCID) 20 MG tablet, TAKE 1 TABLET BY MOUTH TWICE DAILY (Patient not taking: Reported on 12/23/2023), Disp: 180 tablet, Rfl: 0   fluticasone (FLONASE) 50 MCG/ACT nasal spray, USE 2 SPRAYS INTO EACH NOSTRIL ONCE DAILY, Disp: 48 g, Rfl: 0   Fluticasone-Umeclidin-Vilant (TRELEGY ELLIPTA) 200-62.5-25 MCG/ACT AEPB, Inhale 1  Inhalation into the lungs daily., Disp: 30 each, Rfl: 12   hydrOXYzine (VISTARIL) 25 MG capsule, TAKE 1 CAPSULE BY MOUTH EVERY 8 HOURS ASNEEDED, Disp: 90 capsule, Rfl: 2   icosapent Ethyl (VASCEPA) 1 g capsule, Take 2 capsules (2 g total) by mouth 2 (two) times daily. (Patient not taking: Reported on 12/23/2023), Disp: 360 capsule, Rfl: 3   isosorbide mononitrate (IMDUR) 60 MG 24 hr tablet, TAKE 1 TABLET BY MOUTH ONCE DAILY, Disp: 30 tablet, Rfl: 11   losartan (COZAAR) 100 MG tablet, Take 1 tablet (100 mg total) by mouth daily., Disp: 90 tablet, Rfl: 1   meloxicam (MOBIC) 15 MG tablet, TAKE 1 TABLET BY MOUTH ONCE DAILY AS NEEDED FOR PAIN WITH FOOD, Disp: 30 tablet, Rfl: 5   metoprolol tartrate (LOPRESSOR) 100 MG tablet, Take 1 tablet (100 mg total) by mouth once for 1 dose. 2 hours prior to CT, Disp: 1 tablet, Rfl: 0   rizatriptan (MAXALT-MLT) 10 MG disintegrating tablet, Take 1 tablet (10 mg total) by mouth as needed for migraine. May repeat in 2 hours if needed (Patient not taking: Reported on 12/23/2023), Disp: 10 tablet, Rfl: 2   sertraline (ZOLOFT) 100 MG tablet, TAKE 1 TABLET BY MOUTH ONCE DAILY, Disp: 90 tablet, Rfl: 1   WEGOVY 0.25 MG/0.5ML SOAJ, Inject 0.25 mg into the skin once a week., Disp: 2 mL, Rfl: 0   zolpidem (AMBIEN CR) 6.25 MG CR tablet, Take 1  tablet (6.25 mg total) by mouth at bedtime as needed. for sleep, Disp: 30 tablet, Rfl: 5  Past Medical History: Past Medical History:  Diagnosis Date   Anxiety    Aortic valve sclerosis    Bilateral carotid bruits    Carotid artery stenosis    COPD (chronic obstructive pulmonary disease) (HCC)    Hypertension    Mitral regurgitation    Mixed hyperlipidemia    Shoulder pain    Sleeping difficulties     Tobacco Use: Social History   Tobacco Use  Smoking Status Former   Current packs/day: 0.00   Average packs/day: 0.5 packs/day for 30.0 years (15.0 ttl pk-yrs)   Types: Cigarettes   Start date: 10/30/1990   Quit date: 10/30/2020    Years since quitting: 3.1  Smokeless Tobacco Never  Tobacco Comments   Quit Jan 2022    Labs: Review Flowsheet  More data exists      Latest Ref Rng & Units 03/01/2021 09/06/2021 10/17/2022 02/11/2023 09/11/2023  Labs for ITP Cardiac and Pulmonary Rehab  Cholestrol <200 mg/dL 960  454  098  119  147   LDL (calc) mg/dL (calc) 829  92  92  76  154   HDL-C > OR = 50 mg/dL 32  37  38  34  38   Trlycerides <150 mg/dL 562  130  865  784  696   Hemoglobin A1c <5.7 % of total Hgb 5.7  5.6  - - 5.8      Pulmonary Assessment Scores:  Pulmonary Assessment Scores     Row Name 12/26/23 1616         CAT Score   CAT Score 9       mMRC Score   mMRC Score 3              UCSD: Self-administered rating of dyspnea associated with activities of daily living (ADLs) 6-point scale (0 = "not at all" to 5 = "maximal or unable to do because of breathlessness")  Scoring Scores range from 0 to 120.  Minimally important difference is 5 units  CAT: CAT can identify the health impairment of COPD patients and is better correlated with disease progression.  CAT has a scoring range of zero to 40. The CAT score is classified into four groups of low (less than 10), medium (10 - 20), high (21-30) and very high (31-40) based on the impact level of disease on health status. A CAT score over 10 suggests significant symptoms.  A worsening CAT score could be explained by an exacerbation, poor medication adherence, poor inhaler technique, or progression of COPD or comorbid conditions.  CAT MCID is 2 points  mMRC: mMRC (Modified Medical Research Council) Dyspnea Scale is used to assess the degree of baseline functional disability in patients of respiratory disease due to dyspnea. No minimal important difference is established. A decrease in score of 1 point or greater is considered a positive change.   Pulmonary Function Assessment:  Pulmonary Function Assessment - 12/23/23 1113       Breath   Shortness of  Breath Yes;Panic with Shortness of Breath             Exercise Target Goals: Exercise Program Goal: Individual exercise prescription set using results from initial 6 min walk test and THRR while considering  patient's activity barriers and safety.   Exercise Prescription Goal: Initial exercise prescription builds to 30-45 minutes a day of aerobic activity, 2-3 days per week.  Home  exercise guidelines will be given to patient during program as part of exercise prescription that the participant will acknowledge.  Education: Aerobic Exercise: - Group verbal and visual presentation on the components of exercise prescription. Introduces F.I.T.T principle from ACSM for exercise prescriptions.  Reviews F.I.T.T. principles of aerobic exercise including progression. Written material given at graduation.   Education: Resistance Exercise: - Group verbal and visual presentation on the components of exercise prescription. Introduces F.I.T.T principle from ACSM for exercise prescriptions  Reviews F.I.T.T. principles of resistance exercise including progression. Written material given at graduation.    Education: Exercise & Equipment Safety: - Individual verbal instruction and demonstration of equipment use and safety with use of the equipment. Flowsheet Row Pulmonary Rehab from 12/26/2023 in [facility] Cardiac and Pulmonary Rehab  Date 12/26/23  Educator MB  Instruction Review Code 1- Verbalizes Understanding       Education: Exercise Physiology & General Exercise Guidelines: - Group verbal and written instruction with models to review the exercise physiology of the cardiovascular system and associated critical values. Provides general exercise guidelines with specific guidelines to those with heart or lung disease.    Education: Flexibility, Balance, Mind/Body Relaxation: - Group verbal and visual presentation with interactive activity on the components of exercise prescription. Introduces F.I.T.T  principle from ACSM for exercise prescriptions. Reviews F.I.T.T. principles of flexibility and balance exercise training including progression. Also discusses the mind body connection.  Reviews various relaxation techniques to help reduce and manage stress (i.e. Deep breathing, progressive muscle relaxation, and visualization). Balance handout provided to take home. Written material given at graduation.   Activity Barriers & Risk Stratification:  Activity Barriers & Cardiac Risk Stratification - 12/26/23 1608       Activity Barriers & Cardiac Risk Stratification   Activity Barriers Joint Problems;Arthritis   R shoulder bursitis and OA, Bilateral knee pain (occasionally)            6 Minute Walk:  6 Minute Walk     Row Name 12/26/23 1606         6 Minute Walk   Phase Initial     Distance 1230 feet     Walk Time 6 minutes     # of Rest Breaks 0     MPH 2.32     METS 2.59     RPE 17     Perceived Dyspnea  3     VO2 Peak 9.06     Symptoms Yes (comment)     Comments Bilateral hip pain 4/10     Resting HR 62 bpm     Resting BP 126/66     Resting Oxygen Saturation  88 %     Exercise Oxygen Saturation  during 6 min walk 83 %     Max Ex. HR 93 bpm     Max Ex. BP 160/60     2 Minute Post BP 136/70       Interval HR   1 Minute HR 73     2 Minute HR 80     3 Minute HR 88     4 Minute HR 91     5 Minute HR 91     6 Minute HR 93     2 Minute Post HR 52     Interval Heart Rate? Yes       Interval Oxygen   Interval Oxygen? Yes     Baseline Oxygen Saturation % 88 %     1 Minute Oxygen Saturation %  89 %     1 Minute Liters of Oxygen 0 L     2 Minute Oxygen Saturation % 91 %     2 Minute Liters of Oxygen 0 L     3 Minute Oxygen Saturation % 86 %     3 Minute Liters of Oxygen 0 L     4 Minute Oxygen Saturation % 83 %     4 Minute Liters of Oxygen 0 L     5 Minute Oxygen Saturation % 84 %     5 Minute Liters of Oxygen 0 L     6 Minute Oxygen Saturation % 85 %     6  Minute Liters of Oxygen 0 L     2 Minute Post Oxygen Saturation % 94 %     2 Minute Post Liters of Oxygen 0 L             Oxygen Initial Assessment:  Oxygen Initial Assessment - 12/23/23 1112       Home Oxygen   Home Oxygen Device None    Sleep Oxygen Prescription None    Home Exercise Oxygen Prescription None    Home Resting Oxygen Prescription None      Initial 6 min Walk   Oxygen Used None      Program Oxygen Prescription   Program Oxygen Prescription None      Intervention   Short Term Goals To learn and understand importance of monitoring SPO2 with pulse oximeter and demonstrate accurate use of the pulse oximeter.;To learn and demonstrate proper pursed lip breathing techniques or other breathing techniques. ;To learn and understand importance of maintaining oxygen saturations>88%;To learn and demonstrate proper use of respiratory medications;To learn and exhibit compliance with exercise, home and travel O2 prescription    Long  Term Goals Exhibits compliance with exercise, home  and travel O2 prescription;Maintenance of O2 saturations>88%;Compliance with respiratory medication;Demonstrates proper use of MDI's;Exhibits proper breathing techniques, such as pursed lip breathing or other method taught during program session;Verbalizes importance of monitoring SPO2 with pulse oximeter and return demonstration             Oxygen Re-Evaluation:   Oxygen Discharge (Final Oxygen Re-Evaluation):   Initial Exercise Prescription:  Initial Exercise Prescription - 12/26/23 1600       Date of Initial Exercise RX and Referring Provider   Date 12/26/23    Referring Provider Raechel Chute, MD      Oxygen   Maintain Oxygen Saturation 88% or higher      Recumbant Bike   Level 2    RPM 50    Watts 15    Minutes 15    METs 2.59      NuStep   Level 2    SPM 80    Minutes 15    METs 2.59      T5 Nustep   Level 2    SPM 80    Minutes 15    METs 2.59       Biostep-RELP   Level 2    SPM 50    Minutes 15    METs 2.59      Track   Laps 20    Minutes 15    METs 2.09      Prescription Details   Frequency (times per week) 2    Duration Progress to 30 minutes of continuous aerobic without signs/symptoms of physical distress      Intensity   THRR 40-80% of Max  Heartrate 95-129    Ratings of Perceived Exertion 11-13    Perceived Dyspnea 0-4      Progression   Progression Continue to progress workloads to maintain intensity without signs/symptoms of physical distress.      Resistance Training   Training Prescription Yes    Weight 5 lb    Reps 10-15             Perform Capillary Blood Glucose checks as needed.  Exercise Prescription Changes:   Exercise Prescription Changes     Row Name 12/26/23 1600             Response to Exercise   Blood Pressure (Admit) 126/66       Blood Pressure (Exercise) 160/60       Blood Pressure (Exit) 136/70       Heart Rate (Admit) 62 bpm       Heart Rate (Exercise) 93 bpm       Heart Rate (Exit) 52 bpm       Oxygen Saturation (Admit) 88 %       Oxygen Saturation (Exercise) 83 %       Oxygen Saturation (Exit) 94 %       Rating of Perceived Exertion (Exercise) 17       Perceived Dyspnea (Exercise) 3       Symptoms Bilateral hip pain 4/10       Comments results       Duration Progress to 30 minutes of  aerobic without signs/symptoms of physical distress       Intensity THRR New         Progression   Progression Continue to progress workloads to maintain intensity without signs/symptoms of physical distress.       Average METs 2.59                Exercise Comments:   Exercise Goals and Review:   Exercise Goals     Row Name 12/26/23 1614             Exercise Goals   Increase Physical Activity Yes       Intervention Provide advice, education, support and counseling about physical activity/exercise needs.;Develop an individualized exercise prescription for aerobic  and resistive training based on initial evaluation findings, risk stratification, comorbidities and participant's personal goals.       Expected Outcomes Short Term: Attend rehab on a regular basis to increase amount of physical activity.;Long Term: Add in home exercise to make exercise part of routine and to increase amount of physical activity.;Long Term: Exercising regularly at least 3-5 days a week.       Increase Strength and Stamina Yes       Intervention Provide advice, education, support and counseling about physical activity/exercise needs.;Develop an individualized exercise prescription for aerobic and resistive training based on initial evaluation findings, risk stratification, comorbidities and participant's personal goals.       Expected Outcomes Short Term: Increase workloads from initial exercise prescription for resistance, speed, and METs.;Short Term: Perform resistance training exercises routinely during rehab and add in resistance training at home;Long Term: Improve cardiorespiratory fitness, muscular endurance and strength as measured by increased METs and functional capacity ( )       Able to understand and use rate of perceived exertion (RPE) scale Yes       Intervention Provide education and explanation on how to use RPE scale       Expected Outcomes Short Term: Able to use RPE  daily in rehab to express subjective intensity level;Long Term:  Able to use RPE to guide intensity level when exercising independently       Able to understand and use Dyspnea scale Yes       Intervention Provide education and explanation on how to use Dyspnea scale       Expected Outcomes Short Term: Able to use Dyspnea scale daily in rehab to express subjective sense of shortness of breath during exertion;Long Term: Able to use Dyspnea scale to guide intensity level when exercising independently       Knowledge and understanding of Target Heart Rate Range (THRR) Yes       Intervention Provide education  and explanation of THRR including how the numbers were predicted and where they are located for reference       Expected Outcomes Short Term: Able to state/look up THRR;Short Term: Able to use daily as guideline for intensity in rehab;Long Term: Able to use THRR to govern intensity when exercising independently       Able to check pulse independently Yes       Intervention Provide education and demonstration on how to check pulse in carotid and radial arteries.;Review the importance of being able to check your own pulse for safety during independent exercise       Expected Outcomes Short Term: Able to explain why pulse checking is important during independent exercise;Long Term: Able to check pulse independently and accurately       Understanding of Exercise Prescription Yes       Intervention Provide education, explanation, and written materials on patient's individual exercise prescription       Expected Outcomes Short Term: Able to explain program exercise prescription;Long Term: Able to explain home exercise prescription to exercise independently                Exercise Goals Re-Evaluation :   Discharge Exercise Prescription (Final Exercise Prescription Changes):  Exercise Prescription Changes - 12/26/23 1600       Response to Exercise   Blood Pressure (Admit) 126/66    Blood Pressure (Exercise) 160/60    Blood Pressure (Exit) 136/70    Heart Rate (Admit) 62 bpm    Heart Rate (Exercise) 93 bpm    Heart Rate (Exit) 52 bpm    Oxygen Saturation (Admit) 88 %    Oxygen Saturation (Exercise) 83 %    Oxygen Saturation (Exit) 94 %    Rating of Perceived Exertion (Exercise) 17    Perceived Dyspnea (Exercise) 3    Symptoms Bilateral hip pain 4/10    Comments results    Duration Progress to 30 minutes of  aerobic without signs/symptoms of physical distress    Intensity THRR New      Progression   Progression Continue to progress workloads to maintain intensity without  signs/symptoms of physical distress.    Average METs 2.59             Nutrition:  Target Goals: Understanding of nutrition guidelines, daily intake of sodium 1500mg , cholesterol 200mg , calories 30% from fat and 7% or less from saturated fats, daily to have 5 or more servings of fruits and vegetables.  Education: All About Nutrition: -Group instruction provided by verbal, written material, interactive activities, discussions, models, and posters to present general guidelines for heart healthy nutrition including fat, fiber, MyPlate, the role of sodium in heart healthy nutrition, utilization of the nutrition label, and utilization of this knowledge for meal planning. Follow up email  sent as well. Written material given at graduation.   Biometrics:  Pre Biometrics - 12/26/23 1614       Pre Biometrics   Height 5' 5.7" (1.669 m)    Weight 177 lb 3.2 oz (80.4 kg)    Waist Circumference 45 inches    Hip Circumference 42 inches    Waist to Hip Ratio 1.07 %    BMI (Calculated) 28.85    Single Leg Stand 30 seconds              Nutrition Therapy Plan and Nutrition Goals:  Nutrition Therapy & Goals - 12/26/23 1617       Nutrition Therapy   RD appointment deferred Yes      Intervention Plan   Intervention Prescribe, educate and counsel regarding individualized specific dietary modifications aiming towards targeted core components such as weight, hypertension, lipid management, diabetes, heart failure and other comorbidities.    Expected Outcomes Short Term Goal: Understand basic principles of dietary content, such as calories, fat, sodium, cholesterol and nutrients.             Nutrition Assessments:  MEDIFICTS Score Key: >=70 Need to make dietary changes  40-70 Heart Healthy Diet <= 40 Therapeutic Level Cholesterol Diet   Picture Your Plate Scores: <40 Unhealthy dietary pattern with much room for improvement. 41-50 Dietary pattern unlikely to meet recommendations  for good health and room for improvement. 51-60 More healthful dietary pattern, with some room for improvement.  >60 Healthy dietary pattern, although there may be some specific behaviors that could be improved.   Nutrition Goals Re-Evaluation:   Nutrition Goals Discharge (Final Nutrition Goals Re-Evaluation):   Psychosocial: Target Goals: Acknowledge presence or absence of significant depression and/or stress, maximize coping skills, provide positive support system. Participant is able to verbalize types and ability to use techniques and skills needed for reducing stress and depression.   Education: Stress, Anxiety, and Depression - Group verbal and visual presentation to define topics covered.  Reviews how body is impacted by stress, anxiety, and depression.  Also discusses healthy ways to reduce stress and to treat/manage anxiety and depression.  Written material given at graduation.   Education: Sleep Hygiene -Provides group verbal and written instruction about how sleep can affect your health.  Define sleep hygiene, discuss sleep cycles and impact of sleep habits. Review good sleep hygiene tips.    Initial Review & Psychosocial Screening:  Initial Psych Review & Screening - 12/23/23 1114       Initial Review   Current issues with Current Psychotropic Meds;Current Depression;History of Depression      Family Dynamics   Good Support System? Yes    Comments She can look to her son and daughters for support. 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      Barriers   Psychosocial barriers to participate in program The patient should benefit from training in stress management and relaxation.      Screening Interventions   Interventions Encouraged to exercise;Program counselor consult;To provide support and resources with identified psychosocial needs;Provide feedback about the scores to participant    Expected Outcomes Short Term goal: Utilizing  psychosocial counselor, staff and physician to assist with identification of specific Stressors or current issues interfering with healing process. Setting desired goal for each stressor or current issue identified.;Long Term Goal: Stressors or current issues are controlled or eliminated.;Short Term goal: Identification and review with participant of any Quality of Life or Depression  concerns found by scoring the questionnaire.;Long Term goal: The participant improves quality of Life and PHQ9 Scores as seen by post scores and/or verbalization of changes             Quality of Life Scores:  Scores of 19 and below usually indicate a poorer quality of life in these areas.  A difference of  2-3 points is a clinically meaningful difference.  A difference of 2-3 points in the total score of the Quality of Life Index has been associated with significant improvement in overall quality of life, self-image, physical symptoms, and general health in studies assessing change in quality of life.  PHQ-9: Review Flowsheet  More data exists      12/26/2023 02/07/2023 01/05/2022 09/11/2021 03/22/2021  Depression screen PHQ 2/9  Decreased Interest 1 0 0 0 0  Down, Depressed, Hopeless 1 1 0 0 0  PHQ - 2 Score 2 1 0 0 0  Altered sleeping 3 1 - 1 -  Tired, decreased energy 1 1 - 0 -  Change in appetite 0 0 - 0 -  Feeling bad or failure about yourself  2 0 - 0 -  Trouble concentrating 0 1 - 0 -  Moving slowly or fidgety/restless 1 0 - 0 -  Suicidal thoughts 0 0 - 0 -  PHQ-9 Score 9 4 - 1 -  Difficult doing work/chores Somewhat difficult Not difficult at all - Somewhat difficult -   Interpretation of Total Score  Total Score Depression Severity:  1-4 = Minimal depression, 5-9 = Mild depression, 10-14 = Moderate depression, 15-19 = Moderately severe depression, 20-27 = Severe depression   Psychosocial Evaluation and Intervention:  Psychosocial Evaluation - 12/23/23 1116       Psychosocial Evaluation &  Interventions   Interventions Encouraged to exercise with the program and follow exercise prescription;Relaxation education    Comments She can look to her son and daughters for support. 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    Expected Outcomes Short: Attend [facility] regularly to improve shortness of breath with ADL's. Long: maintain independence with ADL's    Continue Psychosocial Services  Follow up required by staff             Psychosocial Re-Evaluation:   Psychosocial Discharge (Final Psychosocial Re-Evaluation):   Education: Education Goals: Education classes will be provided on a weekly basis, covering required topics. Participant will state understanding/return demonstration of topics presented.  Learning Barriers/Preferences:  Learning Barriers/Preferences - 12/23/23 1113       Learning Barriers/Preferences   Learning Barriers None    Learning Preferences None             General Pulmonary Education Topics:  Infection Prevention: - Provides verbal and written material to individual with discussion of infection control including proper hand washing and proper equipment cleaning during exercise session. Flowsheet Row Pulmonary Rehab from 12/26/2023 in [facility] Cardiac and Pulmonary Rehab  Date 12/26/23  Educator MB  Instruction Review Code 1- Verbalizes Understanding       Falls Prevention: - Provides verbal and written material to individual with discussion of falls prevention and safety. Flowsheet Row Pulmonary Rehab from 12/26/2023 in [facility] Cardiac and Pulmonary Rehab  Date 12/26/23  Educator MB  Instruction Review Code 1- Verbalizes Understanding       Chronic Lung Disease Review: - Group verbal instruction with posters, models, PowerPoint presentations and videos,  to review new updates, new respiratory medications, new advancements  in procedures and treatments. Providing information on websites and "800"  numbers for continued self-education. Includes information about supplement oxygen, available portable oxygen systems, continuous and intermittent flow rates, oxygen safety, concentrators, and Medicare reimbursement for oxygen. Explanation of Pulmonary Drugs, including class, frequency, complications, importance of spacers, rinsing mouth after steroid MDI's, and proper cleaning methods for nebulizers. Review of basic lung anatomy and physiology related to function, structure, and complications of lung disease. Review of risk factors. Discussion about methods for diagnosing sleep apnea and types of masks and machines for OSA. Includes a review of the use of types of environmental controls: home humidity, furnaces, filters, dust mite/pet prevention, HEPA vacuums. Discussion about weather changes, air quality and the benefits of nasal washing. Instruction on Warning signs, infection symptoms, calling MD promptly, preventive modes, and value of vaccinations. Review of effective airway clearance, coughing and/or vibration techniques. Emphasizing that all should Create an Action Plan. Written material given at graduation.   AED/CPR: - Group verbal and written instruction with the use of models to demonstrate the basic use of the AED with the basic ABC's of resuscitation.    Anatomy and Cardiac Procedures: - Group verbal and visual presentation and models provide information about basic cardiac anatomy and function. Reviews the testing methods done to diagnose heart disease and the outcomes of the test results. Describes the treatment choices: Medical Management, Angioplasty, or Coronary Bypass Surgery for treating various heart conditions including Myocardial Infarction, Angina, Valve Disease, and Cardiac Arrhythmias.  Written material given at graduation.   Medication Safety: - Group verbal and visual instruction to review commonly prescribed medications for heart and lung disease. Reviews the medication,  class of the drug, and side effects. Includes the steps to properly store meds and maintain the prescription regimen.  Written material given at graduation.   Other: -Provides group and verbal instruction on various topics (see comments)   Knowledge Questionnaire Score:    Core Components/Risk Factors/Patient Goals at Admission:  Personal Goals and Risk Factors at Admission - 12/26/23 1617       Core Components/Risk Factors/Patient Goals on Admission    Weight Management Yes;Weight Loss    Intervention Weight Management: Develop a combined nutrition and exercise program designed to reach desired caloric intake, while maintaining appropriate intake of nutrient and fiber, sodium and fats, and appropriate energy expenditure required for the weight goal.;Weight Management: Provide education and appropriate resources to help participant work on and attain dietary goals.;Weight Management/Obesity: Establish reasonable short term and long term weight goals.    Admit Weight 177 lb 3.2 oz (80.4 kg)    Goal Weight: Short Term 160 lb (72.6 kg)    Goal Weight: Long Term 150 lb (68 kg)    Expected Outcomes Short Term: Continue to assess and modify interventions until short term weight is achieved;Weight Maintenance: Understanding of the daily nutrition guidelines, which includes 25-35% calories from fat, 7% or less cal from saturated fats, less than 200mg  cholesterol, less than 1.5gm of sodium, & 5 or more servings of fruits and vegetables daily;Weight Loss: Understanding of general recommendations for a balanced deficit meal plan, which promotes 1-2 lb weight loss per week and includes a negative energy balance of [phone] kcal/d;Understanding recommendations for meals to include 15-35% energy as protein, 25-35% energy from fat, 35-60% energy from carbohydrates, less than 200mg  of dietary cholesterol, 20-35 gm of total fiber daily;Understanding of distribution of calorie intake throughout the day with the  consumption of 4-5 meals/snacks    Improve shortness of  breath with ADL's Yes    Intervention Provide education, individualized exercise plan and daily activity instruction to help decrease symptoms of SOB with activities of daily living.    Expected Outcomes Short Term: Improve cardiorespiratory fitness to achieve a reduction of symptoms when performing ADLs;Long Term: Be able to perform more ADLs without symptoms or delay the onset of symptoms    Hypertension Yes    Intervention Provide education on lifestyle modifcations including regular physical activity/exercise, weight management, moderate sodium restriction and increased consumption of fresh fruit, vegetables, and low fat dairy, alcohol moderation, and smoking cessation.;Monitor prescription use compliance.    Expected Outcomes Short Term: Continued assessment and intervention until BP is < 140/25mm HG in hypertensive participants. < 130/81mm HG in hypertensive participants with diabetes, heart failure or chronic kidney disease.;Long Term: Maintenance of blood pressure at goal levels.    Lipids Yes    Intervention Provide education and support for participant on nutrition & aerobic/resistive exercise along with prescribed medications to achieve LDL 70mg , HDL >40mg .    Expected Outcomes Short Term: Participant states understanding of desired cholesterol values and is compliant with medications prescribed. Participant is following exercise prescription and nutrition guidelines.;Long Term: Cholesterol controlled with medications as prescribed, with individualized exercise RX and with personalized nutrition plan. Value goals: LDL < 70mg , HDL > 40 mg.             Education:Diabetes - Individual verbal and written instruction to review signs/symptoms of diabetes, desired ranges of glucose level fasting, after meals and with exercise. Acknowledge that pre and post exercise glucose checks will be done for 3 sessions at entry of program.   Know  Your Numbers and Heart Failure: - Group verbal and visual instruction to discuss disease risk factors for cardiac and pulmonary disease and treatment options.  Reviews associated critical values for Overweight/Obesity, Hypertension, Cholesterol, and Diabetes.  Discusses basics of heart failure: signs/symptoms and treatments.  Introduces Heart Failure Zone chart for action plan for heart failure.  Written material given at graduation.   Core Components/Risk Factors/Patient Goals Review:    Core Components/Risk Factors/Patient Goals at Discharge (Final Review):    ITP Comments:  ITP Comments     Row Name 12/23/23 1112 12/26/23 1606         ITP Comments Virtual Visit completed. Patient informed on EP and RD appointment and 6 Minute walk test. Patient also informed of patient health questionnaires on My Chart. Patient Verbalizes understanding. Visit diagnosis can be found in [facility] 10/18/[facility]23. Completed and gym orientation. Initial ITP created and sent for review to Dr. Jinny Sanders, Medical Director.               Comments: Initial ITP

## 2023-12-26 NOTE — Patient Instructions (Signed)
 Patient Instructions  Patient Details  Name: Kristin Parker MRN: [id number] Date of Birth: 09-02-1951 Referring Provider:  Raechel Chute, MD  Below are your personal goals for exercise, nutrition, and risk factors. Our goal is to help you stay on track towards obtaining and maintaining these goals. We will be discussing your progress on these goals with you throughout the program.  Initial Exercise Prescription:  Initial Exercise Prescription - 12/26/23 1600       Date of Initial Exercise RX and Referring Provider   Date 12/26/23    Referring Provider Raechel Chute, MD      Oxygen   Maintain Oxygen Saturation 88% or higher      Recumbant Bike   Level 2    RPM 50    Watts 15    Minutes 15    METs 2.59      NuStep   Level 2    SPM 80    Minutes 15    METs 2.59      T5 Nustep   Level 2    SPM 80    Minutes 15    METs 2.59      Biostep-RELP   Level 2    SPM 50    Minutes 15    METs 2.59      Track   Laps 20    Minutes 15    METs 2.09      Prescription Details   Frequency (times per week) 2    Duration Progress to 30 minutes of continuous aerobic without signs/symptoms of physical distress      Intensity   THRR 40-80% of Max Heartrate 95-129    Ratings of Perceived Exertion 11-13    Perceived Dyspnea 0-4      Progression   Progression Continue to progress workloads to maintain intensity without signs/symptoms of physical distress.      Resistance Training   Training Prescription Yes    Weight 5 lb    Reps 10-15             Exercise Goals: Frequency: Be able to perform aerobic exercise two to three times per week in program working toward 2-5 days per week of home exercise.  Intensity: Work with a perceived exertion of 11 (fairly light) - 15 (hard) while following your exercise prescription.  We will make changes to your prescription with you as you progress through the program.   Duration: Be able to do 30 to 45 minutes of continuous aerobic  exercise in addition to a 5 minute warm-up and a 5 minute cool-down routine.   Nutrition Goals: Your personal nutrition goals will be established when you do your nutrition analysis with the dietician.  The following are general nutrition guidelines to follow: Cholesterol < 200mg /day Sodium < 1500mg /day Fiber: Women over 50 yrs - 21 grams per day  Personal Goals:  Personal Goals and Risk Factors at Admission - 12/26/23 1617       Core Components/Risk Factors/Patient Goals on Admission    Weight Management Yes;Weight Loss    Intervention Weight Management: Develop a combined nutrition and exercise program designed to reach desired caloric intake, while maintaining appropriate intake of nutrient and fiber, sodium and fats, and appropriate energy expenditure required for the weight goal.;Weight Management: Provide education and appropriate resources to help participant work on and attain dietary goals.;Weight Management/Obesity: Establish reasonable short term and long term weight goals.    Admit Weight 177 lb 3.2 oz (80.4 kg)  Goal Weight: Short Term 160 lb (72.6 kg)    Goal Weight: Long Term 150 lb (68 kg)    Expected Outcomes Short Term: Continue to assess and modify interventions until short term weight is achieved;Weight Maintenance: Understanding of the daily nutrition guidelines, which includes 25-35% calories from fat, 7% or less cal from saturated fats, less than 200mg  cholesterol, less than 1.5gm of sodium, & 5 or more servings of fruits and vegetables daily;Weight Loss: Understanding of general recommendations for a balanced deficit meal plan, which promotes 1-2 lb weight loss per week and includes a negative energy balance of [phone] kcal/d;Understanding recommendations for meals to include 15-35% energy as protein, 25-35% energy from fat, 35-60% energy from carbohydrates, less than 200mg  of dietary cholesterol, 20-35 gm of total fiber daily;Understanding of distribution of calorie  intake throughout the day with the consumption of 4-5 meals/snacks    Improve shortness of breath with ADL's Yes    Intervention Provide education, individualized exercise plan and daily activity instruction to help decrease symptoms of SOB with activities of daily living.    Expected Outcomes Short Term: Improve cardiorespiratory fitness to achieve a reduction of symptoms when performing ADLs;Long Term: Be able to perform more ADLs without symptoms or delay the onset of symptoms    Hypertension Yes    Intervention Provide education on lifestyle modifcations including regular physical activity/exercise, weight management, moderate sodium restriction and increased consumption of fresh fruit, vegetables, and low fat dairy, alcohol moderation, and smoking cessation.;Monitor prescription use compliance.    Expected Outcomes Short Term: Continued assessment and intervention until BP is < 140/13mm HG in hypertensive participants. < 130/3mm HG in hypertensive participants with diabetes, heart failure or chronic kidney disease.;Long Term: Maintenance of blood pressure at goal levels.    Lipids Yes    Intervention Provide education and support for participant on nutrition & aerobic/resistive exercise along with prescribed medications to achieve LDL 70mg , HDL >40mg .    Expected Outcomes Short Term: Participant states understanding of desired cholesterol values and is compliant with medications prescribed. Participant is following exercise prescription and nutrition guidelines.;Long Term: Cholesterol controlled with medications as prescribed, with individualized exercise RX and with personalized nutrition plan. Value goals: LDL < 70mg , HDL > 40 mg.             Tobacco Use Initial Evaluation: Social History   Tobacco Use  Smoking Status Former   Current packs/day: 0.00   Average packs/day: 0.5 packs/day for 30.0 years (15.0 ttl pk-yrs)   Types: Cigarettes   Start date: 10/30/1990   Quit date: 10/30/2020    Years since quitting: 3.1  Smokeless Tobacco Never  Tobacco Comments   Quit Jan 2022    Exercise Goals and Review:  Exercise Goals     Row Name 12/26/23 1614             Exercise Goals   Increase Physical Activity Yes       Intervention Provide advice, education, support and counseling about physical activity/exercise needs.;Develop an individualized exercise prescription for aerobic and resistive training based on initial evaluation findings, risk stratification, comorbidities and participant's personal goals.       Expected Outcomes Short Term: Attend rehab on a regular basis to increase amount of physical activity.;Long Term: Add in home exercise to make exercise part of routine and to increase amount of physical activity.;Long Term: Exercising regularly at least 3-5 days a week.       Increase Strength and Stamina Yes  Intervention Provide advice, education, support and counseling about physical activity/exercise needs.;Develop an individualized exercise prescription for aerobic and resistive training based on initial evaluation findings, risk stratification, comorbidities and participant's personal goals.       Expected Outcomes Short Term: Increase workloads from initial exercise prescription for resistance, speed, and METs.;Short Term: Perform resistance training exercises routinely during rehab and add in resistance training at home;Long Term: Improve cardiorespiratory fitness, muscular endurance and strength as measured by increased METs and functional capacity ( )       Able to understand and use rate of perceived exertion (RPE) scale Yes       Intervention Provide education and explanation on how to use RPE scale       Expected Outcomes Short Term: Able to use RPE daily in rehab to express subjective intensity level;Long Term:  Able to use RPE to guide intensity level when exercising independently       Able to understand and use Dyspnea scale Yes       Intervention  Provide education and explanation on how to use Dyspnea scale       Expected Outcomes Short Term: Able to use Dyspnea scale daily in rehab to express subjective sense of shortness of breath during exertion;Long Term: Able to use Dyspnea scale to guide intensity level when exercising independently       Knowledge and understanding of Target Heart Rate Range (THRR) Yes       Intervention Provide education and explanation of THRR including how the numbers were predicted and where they are located for reference       Expected Outcomes Short Term: Able to state/look up THRR;Short Term: Able to use daily as guideline for intensity in rehab;Long Term: Able to use THRR to govern intensity when exercising independently       Able to check pulse independently Yes       Intervention Provide education and demonstration on how to check pulse in carotid and radial arteries.;Review the importance of being able to check your own pulse for safety during independent exercise       Expected Outcomes Short Term: Able to explain why pulse checking is important during independent exercise;Long Term: Able to check pulse independently and accurately       Understanding of Exercise Prescription Yes       Intervention Provide education, explanation, and written materials on patient's individual exercise prescription       Expected Outcomes Short Term: Able to explain program exercise prescription;Long Term: Able to explain home exercise prescription to exercise independently

## 2023-12-30 ENCOUNTER — Encounter: Payer: Medicare PPO | Attending: Student in an Organized Health Care Education/Training Program | Admitting: *Deleted

## 2023-12-30 DIAGNOSIS — J439 Emphysema, unspecified: Secondary | ICD-10-CM | POA: Diagnosis present

## 2023-12-30 NOTE — Progress Notes (Signed)
 Daily Session Note  Patient Details  Name: Kristin Parker MRN: [id number] Date of Birth: 02-27-1951 Referring Provider:   Doristine Devoid Pulmonary Rehab from 12/26/2023 in [facility] Cardiac and Pulmonary Rehab  Referring Provider Raechel Chute, MD       Encounter Date: 12/30/2023  Check In:  Session Check In - 12/30/23 1404       Check-In   Supervising physician immediately available to respond to emergencies See telemetry face sheet for immediately available ER MD    Location [facility]-Cardiac & Pulmonary Rehab    Staff Present Susann Givens RN,BSN;Joseph Hollace Kinnier;Betsy Coder PhD, RN,CNS,CEN;Noah Tickle, BS, Exercise Physiologist    Virtual Visit No    Medication changes reported     No    Fall or balance concerns reported    No    Warm-up and Cool-down Performed on first and last piece of equipment    Resistance Training Performed Yes    VAD Patient? No    PAD/SET Patient? No      Pain Assessment   Currently in Pain? No/denies                Social History   Tobacco Use  Smoking Status Former   Current packs/day: 0.00   Average packs/day: 0.5 packs/day for 30.0 years (15.0 ttl pk-yrs)   Types: Cigarettes   Start date: 10/30/1990   Quit date: 10/30/2020   Years since quitting: 3.1  Smokeless Tobacco Never  Tobacco Comments   Quit Jan 2022    Goals Met:  Independence with exercise equipment Exercise tolerated well No report of concerns or symptoms today Strength training completed today  Goals Unmet:  Not Applicable  Comments: First full day of exercise!  Patient was oriented to gym and equipment including functions, settings, policies, and procedures.  Patient's individual exercise prescription and treatment plan were reviewed.  All starting workloads were established based on the results of the 6 minute walk test done at initial orientation visit.  The plan for exercise progression was also introduced and progression will be customized based on patient's  performance and goals.    Dr. Bethann Punches is Medical Director for [facility] Cardiac Rehabilitation.  Dr. Vida Rigger is Medical Director for [facility] Pulmonary Rehabilitation.

## 2024-01-01 ENCOUNTER — Encounter: Payer: Medicare PPO | Admitting: *Deleted

## 2024-01-01 DIAGNOSIS — J439 Emphysema, unspecified: Secondary | ICD-10-CM

## 2024-01-01 NOTE — Progress Notes (Signed)
 Daily Session Note  Patient Details  Name: Kristin Parker MRN: [id number] Date of Birth: 01/18/1951 Referring Provider:   Flowsheet Row Pulmonary Rehab from 12/26/2023 in [facility] Cardiac and Pulmonary Rehab  Referring Provider Raechel Chute, MD       Encounter Date: 01/01/2024  Check In:  Session Check In - 01/01/24 1358       Check-In   Supervising physician immediately available to respond to emergencies See telemetry face sheet for immediately available ER MD    Location [facility]-Cardiac & Pulmonary Rehab    Staff Present Susann Givens RN,BSN;Kelly Madilyn Fireman BS, ACSM CEP, Exercise Physiologist;Noah Tickle, BS, Exercise Physiologist    Virtual Visit No    Medication changes reported     No    Fall or balance concerns reported    No    Warm-up and Cool-down Performed on first and last piece of equipment    Resistance Training Performed Yes    VAD Patient? No    PAD/SET Patient? No      Pain Assessment   Currently in Pain? No/denies                Social History   Tobacco Use  Smoking Status Former   Current packs/day: 0.00   Average packs/day: 0.5 packs/day for 30.0 years (15.0 ttl pk-yrs)   Types: Cigarettes   Start date: 10/30/1990   Quit date: 10/30/2020   Years since quitting: 3.1  Smokeless Tobacco Never  Tobacco Comments   Quit Jan 2022    Goals Met:  Independence with exercise equipment Exercise tolerated well No report of concerns or symptoms today Strength training completed today  Goals Unmet:  Not Applicable  Comments: Pt able to follow exercise prescription today without complaint.  Will continue to monitor for progression.    Dr. Bethann Punches is Medical Director for [facility] Cardiac Rehabilitation.  Dr. Vida Rigger is Medical Director for [facility] Pulmonary Rehabilitation.

## 2024-01-08 ENCOUNTER — Ambulatory Visit: Payer: Medicare PPO

## 2024-01-08 DIAGNOSIS — J439 Emphysema, unspecified: Secondary | ICD-10-CM

## 2024-01-08 NOTE — Progress Notes (Signed)
 Pulmonary Individual Treatment Plan  Patient Details  Name: Kristin Parker MRN: [id number] Date of Birth: 1951-07-15 Referring Provider:   Flowsheet Row Pulmonary Rehab from 12/26/2023 in [facility] Cardiac and Pulmonary Rehab  Referring Provider Raechel Chute, MD       Initial Encounter Date:  Flowsheet Row Pulmonary Rehab from 12/26/2023 in [facility] Cardiac and Pulmonary Rehab  Date 12/26/23       Visit Diagnosis: Pulmonary emphysema, unspecified emphysema type (HCC)  Patient's Home Medications on Admission:  Current Outpatient Medications:    REPATHA SURECLICK 140 MG/ML SOAJ, INJECT 1 DOSE INTO THE SKIN EVERY 14 DAYS, Disp: 6 mL, Rfl: 0   acetaminophen (TYLENOL) 500 MG tablet, Take 2 tablets (1,000 mg total) by mouth every 6 (six) hours as needed for mild pain., Disp: , Rfl:    acetic acid-hydrocortisone (VOSOL-HC) OTIC solution, Place 3 drops into both ears 3 (three) times daily. May use wick for first 24 hours (Patient not taking: Reported on 12/23/2023), Disp: 10 mL, Rfl: 0   albuterol (VENTOLIN HFA) 108 (90 Base) MCG/ACT inhaler, INHALE 2 PUFFS EVERY 4 HOURS FOR WHEEZING OR SHORTNESS OF BREATH AS NEEDED, Disp: 18 g, Rfl: 3   amLODipine (NORVASC) 10 MG tablet, TAKE 1 TABLET BY MOUTH ONCE DAILY, Disp: 90 tablet, Rfl: 1   aspirin EC 81 MG tablet, Take 1 tablet (81 mg total) by mouth daily., Disp: , Rfl:    busPIRone (BUSPAR) 5 MG tablet, Take 1 tablet (5 mg total) by mouth 2 (two) times daily., Disp: 180 tablet, Rfl: 3   estradiol (ESTRACE) 0.1 MG/GM vaginal cream, Apply 1 gram per vagina every night for 2 weeks, then apply three times a week, Disp: 42.5 g, Rfl: 5   famotidine (PEPCID) 20 MG tablet, TAKE 1 TABLET BY MOUTH TWICE DAILY (Patient not taking: Reported on 12/23/2023), Disp: 180 tablet, Rfl: 0   fluticasone (FLONASE) 50 MCG/ACT nasal spray, USE 2 SPRAYS INTO EACH NOSTRIL ONCE DAILY, Disp: 48 g, Rfl: 0   Fluticasone-Umeclidin-Vilant (TRELEGY ELLIPTA) 200-62.5-25 MCG/ACT AEPB, Inhale 1  Inhalation into the lungs daily., Disp: 30 each, Rfl: 12   hydrOXYzine (VISTARIL) 25 MG capsule, TAKE 1 CAPSULE BY MOUTH EVERY 8 HOURS ASNEEDED, Disp: 90 capsule, Rfl: 2   icosapent Ethyl (VASCEPA) 1 g capsule, Take 2 capsules (2 g total) by mouth 2 (two) times daily. (Patient not taking: Reported on 12/23/2023), Disp: 360 capsule, Rfl: 3   isosorbide mononitrate (IMDUR) 60 MG 24 hr tablet, TAKE 1 TABLET BY MOUTH ONCE DAILY, Disp: 30 tablet, Rfl: 11   losartan (COZAAR) 100 MG tablet, Take 1 tablet (100 mg total) by mouth daily., Disp: 90 tablet, Rfl: 1   meloxicam (MOBIC) 15 MG tablet, TAKE 1 TABLET BY MOUTH ONCE DAILY AS NEEDED FOR PAIN WITH FOOD, Disp: 30 tablet, Rfl: 5   metoprolol tartrate (LOPRESSOR) 100 MG tablet, Take 1 tablet (100 mg total) by mouth once for 1 dose. 2 hours prior to CT, Disp: 1 tablet, Rfl: 0   rizatriptan (MAXALT-MLT) 10 MG disintegrating tablet, Take 1 tablet (10 mg total) by mouth as needed for migraine. May repeat in 2 hours if needed (Patient not taking: Reported on 12/23/2023), Disp: 10 tablet, Rfl: 2   sertraline (ZOLOFT) 100 MG tablet, TAKE 1 TABLET BY MOUTH ONCE DAILY, Disp: 90 tablet, Rfl: 1   WEGOVY 0.25 MG/0.5ML SOAJ, Inject 0.25 mg into the skin once a week., Disp: 2 mL, Rfl: 0   zolpidem (AMBIEN CR) 6.25 MG CR tablet, Take 1  tablet (6.25 mg total) by mouth at bedtime as needed. for sleep, Disp: 30 tablet, Rfl: 5  Past Medical History: Past Medical History:  Diagnosis Date   Anxiety    Aortic valve sclerosis    Bilateral carotid bruits    Carotid artery stenosis    COPD (chronic obstructive pulmonary disease) (HCC)    Hypertension    Mitral regurgitation    Mixed hyperlipidemia    Shoulder pain    Sleeping difficulties     Tobacco Use: Social History   Tobacco Use  Smoking Status Former   Current packs/day: 0.00   Average packs/day: 0.5 packs/day for 30.0 years (15.0 ttl pk-yrs)   Types: Cigarettes   Start date: 10/30/1990   Quit date: 10/30/2020    Years since quitting: 3.1  Smokeless Tobacco Never  Tobacco Comments   Quit Jan 2022    Labs: Review Flowsheet  More data exists      Latest Ref Rng & Units 03/01/2021 09/06/2021 10/17/2022 02/11/2023 09/11/2023  Labs for ITP Cardiac and Pulmonary Rehab  Cholestrol <200 mg/dL 161  096  045  409  811   LDL (calc) mg/dL (calc) 914  92  92  76  154   HDL-C > OR = 50 mg/dL 32  37  38  34  38   Trlycerides <150 mg/dL 782  956  213  086  578   Hemoglobin A1c <5.7 % of total Hgb 5.7  5.6  - - 5.8      Pulmonary Assessment Scores:  Pulmonary Assessment Scores     Row Name 12/26/23 1616         CAT Score   CAT Score 9       mMRC Score   mMRC Score 3              UCSD: Self-administered rating of dyspnea associated with activities of daily living (ADLs) 6-point scale (0 = "not at all" to 5 = "maximal or unable to do because of breathlessness")  Scoring Scores range from 0 to 120.  Minimally important difference is 5 units  CAT: CAT can identify the health impairment of COPD patients and is better correlated with disease progression.  CAT has a scoring range of zero to 40. The CAT score is classified into four groups of low (less than 10), medium (10 - 20), high (21-30) and very high (31-40) based on the impact level of disease on health status. A CAT score over 10 suggests significant symptoms.  A worsening CAT score could be explained by an exacerbation, poor medication adherence, poor inhaler technique, or progression of COPD or comorbid conditions.  CAT MCID is 2 points  mMRC: mMRC (Modified Medical Research Council) Dyspnea Scale is used to assess the degree of baseline functional disability in patients of respiratory disease due to dyspnea. No minimal important difference is established. A decrease in score of 1 point or greater is considered a positive change.   Pulmonary Function Assessment:  Pulmonary Function Assessment - 12/23/23 1113       Breath   Shortness of  Breath Yes;Panic with Shortness of Breath             Exercise Target Goals: Exercise Program Goal: Individual exercise prescription set using results from initial 6 min walk test and THRR while considering  patient's activity barriers and safety.   Exercise Prescription Goal: Initial exercise prescription builds to 30-45 minutes a day of aerobic activity, 2-3 days per week.  Home  exercise guidelines will be given to patient during program as part of exercise prescription that the participant will acknowledge.  Education: Aerobic Exercise: - Group verbal and visual presentation on the components of exercise prescription. Introduces F.I.T.T principle from ACSM for exercise prescriptions.  Reviews F.I.T.T. principles of aerobic exercise including progression. Written material given at graduation.   Education: Resistance Exercise: - Group verbal and visual presentation on the components of exercise prescription. Introduces F.I.T.T principle from ACSM for exercise prescriptions  Reviews F.I.T.T. principles of resistance exercise including progression. Written material given at graduation.    Education: Exercise & Equipment Safety: - Individual verbal instruction and demonstration of equipment use and safety with use of the equipment. Flowsheet Row Pulmonary Rehab from 12/26/2023 in [facility] Cardiac and Pulmonary Rehab  Date 12/26/23  Educator MB  Instruction Review Code 1- Verbalizes Understanding       Education: Exercise Physiology & General Exercise Guidelines: - Group verbal and written instruction with models to review the exercise physiology of the cardiovascular system and associated critical values. Provides general exercise guidelines with specific guidelines to those with heart or lung disease.    Education: Flexibility, Balance, Mind/Body Relaxation: - Group verbal and visual presentation with interactive activity on the components of exercise prescription. Introduces F.I.T.T  principle from ACSM for exercise prescriptions. Reviews F.I.T.T. principles of flexibility and balance exercise training including progression. Also discusses the mind body connection.  Reviews various relaxation techniques to help reduce and manage stress (i.e. Deep breathing, progressive muscle relaxation, and visualization). Balance handout provided to take home. Written material given at graduation.   Activity Barriers & Risk Stratification:  Activity Barriers & Cardiac Risk Stratification - 12/26/23 1608       Activity Barriers & Cardiac Risk Stratification   Activity Barriers Joint Problems;Arthritis   R shoulder bursitis and OA, Bilateral knee pain (occasionally)            6 Minute Walk:  6 Minute Walk     Row Name 12/26/23 1606         6 Minute Walk   Phase Initial     Distance 1230 feet     Walk Time 6 minutes     # of Rest Breaks 0     MPH 2.32     METS 2.59     RPE 17     Perceived Dyspnea  3     VO2 Peak 9.06     Symptoms Yes (comment)     Comments Bilateral hip pain 4/10     Resting HR 62 bpm     Resting BP 126/66     Resting Oxygen Saturation  88 %     Exercise Oxygen Saturation  during 6 min walk 83 %     Max Ex. HR 93 bpm     Max Ex. BP 160/60     2 Minute Post BP 136/70       Interval HR   1 Minute HR 73     2 Minute HR 80     3 Minute HR 88     4 Minute HR 91     5 Minute HR 91     6 Minute HR 93     2 Minute Post HR 52     Interval Heart Rate? Yes       Interval Oxygen   Interval Oxygen? Yes     Baseline Oxygen Saturation % 88 %     1 Minute Oxygen Saturation %  89 %     1 Minute Liters of Oxygen 0 L     2 Minute Oxygen Saturation % 91 %     2 Minute Liters of Oxygen 0 L     3 Minute Oxygen Saturation % 86 %     3 Minute Liters of Oxygen 0 L     4 Minute Oxygen Saturation % 83 %     4 Minute Liters of Oxygen 0 L     5 Minute Oxygen Saturation % 84 %     5 Minute Liters of Oxygen 0 L     6 Minute Oxygen Saturation % 85 %     6  Minute Liters of Oxygen 0 L     2 Minute Post Oxygen Saturation % 94 %     2 Minute Post Liters of Oxygen 0 L             Oxygen Initial Assessment:  Oxygen Initial Assessment - 12/23/23 1112       Home Oxygen   Home Oxygen Device None    Sleep Oxygen Prescription None    Home Exercise Oxygen Prescription None    Home Resting Oxygen Prescription None      Initial 6 min Walk   Oxygen Used None      Program Oxygen Prescription   Program Oxygen Prescription None      Intervention   Short Term Goals To learn and understand importance of monitoring SPO2 with pulse oximeter and demonstrate accurate use of the pulse oximeter.;To learn and demonstrate proper pursed lip breathing techniques or other breathing techniques. ;To learn and understand importance of maintaining oxygen saturations>88%;To learn and demonstrate proper use of respiratory medications;To learn and exhibit compliance with exercise, home and travel O2 prescription    Long  Term Goals Exhibits compliance with exercise, home  and travel O2 prescription;Maintenance of O2 saturations>88%;Compliance with respiratory medication;Demonstrates proper use of MDI's;Exhibits proper breathing techniques, such as pursed lip breathing or other method taught during program session;Verbalizes importance of monitoring SPO2 with pulse oximeter and return demonstration             Oxygen Re-Evaluation:  Oxygen Re-Evaluation     Row Name 12/30/23 1408             Goals/Expected Outcomes   Comments Reviewed PLB technique with pt.  Talked about how it works and it's importance in maintaining their exercise saturations.       Goals/Expected Outcomes Short: Become more profiecient at using PLB.   Long: Become independent at using PLB.                Oxygen Discharge (Final Oxygen Re-Evaluation):  Oxygen Re-Evaluation - 12/30/23 1408       Goals/Expected Outcomes   Comments Reviewed PLB technique with pt.  Talked about how  it works and it's importance in maintaining their exercise saturations.    Goals/Expected Outcomes Short: Become more profiecient at using PLB.   Long: Become independent at using PLB.             Initial Exercise Prescription:  Initial Exercise Prescription - 12/26/23 1600       Date of Initial Exercise RX and Referring Provider   Date 12/26/23    Referring Provider Raechel Chute, MD      Oxygen   Maintain Oxygen Saturation 88% or higher      Recumbant Bike   Level 2    RPM 50    Watts  15    Minutes 15    METs 2.59      NuStep   Level 2    SPM 80    Minutes 15    METs 2.59      T5 Nustep   Level 2    SPM 80    Minutes 15    METs 2.59      Biostep-RELP   Level 2    SPM 50    Minutes 15    METs 2.59      Track   Laps 20    Minutes 15    METs 2.09      Prescription Details   Frequency (times per week) 2    Duration Progress to 30 minutes of continuous aerobic without signs/symptoms of physical distress      Intensity   THRR 40-80% of Max Heartrate 95-129    Ratings of Perceived Exertion 11-13    Perceived Dyspnea 0-4      Progression   Progression Continue to progress workloads to maintain intensity without signs/symptoms of physical distress.      Resistance Training   Training Prescription Yes    Weight 5 lb    Reps 10-15             Perform Capillary Blood Glucose checks as needed.  Exercise Prescription Changes:   Exercise Prescription Changes     Row Name 12/26/23 1600             Response to Exercise   Blood Pressure (Admit) 126/66       Blood Pressure (Exercise) 160/60       Blood Pressure (Exit) 136/70       Heart Rate (Admit) 62 bpm       Heart Rate (Exercise) 93 bpm       Heart Rate (Exit) 52 bpm       Oxygen Saturation (Admit) 88 %       Oxygen Saturation (Exercise) 83 %       Oxygen Saturation (Exit) 94 %       Rating of Perceived Exertion (Exercise) 17       Perceived Dyspnea (Exercise) 3       Symptoms  Bilateral hip pain 4/10       Comments results       Duration Progress to 30 minutes of  aerobic without signs/symptoms of physical distress       Intensity THRR New         Progression   Progression Continue to progress workloads to maintain intensity without signs/symptoms of physical distress.       Average METs 2.59                Exercise Comments:   Exercise Comments     Row Name 12/30/23 1406           Exercise Comments First full day of exercise!  Patient was oriented to gym and equipment including functions, settings, policies, and procedures.  Patient's individual exercise prescription and treatment plan were reviewed.  All starting workloads were established based on the results of the 6 minute walk test done at initial orientation visit.  The plan for exercise progression was also introduced and progression will be customized based on patient's performance and goals.                Exercise Goals and Review:   Exercise Goals     Row Name 12/26/23 [phone]  Exercise Goals   Increase Physical Activity Yes       Intervention Provide advice, education, support and counseling about physical activity/exercise needs.;Develop an individualized exercise prescription for aerobic and resistive training based on initial evaluation findings, risk stratification, comorbidities and participant's personal goals.       Expected Outcomes Short Term: Attend rehab on a regular basis to increase amount of physical activity.;Long Term: Add in home exercise to make exercise part of routine and to increase amount of physical activity.;Long Term: Exercising regularly at least 3-5 days a week.       Increase Strength and Stamina Yes       Intervention Provide advice, education, support and counseling about physical activity/exercise needs.;Develop an individualized exercise prescription for aerobic and resistive training based on initial evaluation findings, risk  stratification, comorbidities and participant's personal goals.       Expected Outcomes Short Term: Increase workloads from initial exercise prescription for resistance, speed, and METs.;Short Term: Perform resistance training exercises routinely during rehab and add in resistance training at home;Long Term: Improve cardiorespiratory fitness, muscular endurance and strength as measured by increased METs and functional capacity ( )       Able to understand and use rate of perceived exertion (RPE) scale Yes       Intervention Provide education and explanation on how to use RPE scale       Expected Outcomes Short Term: Able to use RPE daily in rehab to express subjective intensity level;Long Term:  Able to use RPE to guide intensity level when exercising independently       Able to understand and use Dyspnea scale Yes       Intervention Provide education and explanation on how to use Dyspnea scale       Expected Outcomes Short Term: Able to use Dyspnea scale daily in rehab to express subjective sense of shortness of breath during exertion;Long Term: Able to use Dyspnea scale to guide intensity level when exercising independently       Knowledge and understanding of Target Heart Rate Range (THRR) Yes       Intervention Provide education and explanation of THRR including how the numbers were predicted and where they are located for reference       Expected Outcomes Short Term: Able to state/look up THRR;Short Term: Able to use daily as guideline for intensity in rehab;Long Term: Able to use THRR to govern intensity when exercising independently       Able to check pulse independently Yes       Intervention Provide education and demonstration on how to check pulse in carotid and radial arteries.;Review the importance of being able to check your own pulse for safety during independent exercise       Expected Outcomes Short Term: Able to explain why pulse checking is important during independent  exercise;Long Term: Able to check pulse independently and accurately       Understanding of Exercise Prescription Yes       Intervention Provide education, explanation, and written materials on patient's individual exercise prescription       Expected Outcomes Short Term: Able to explain program exercise prescription;Long Term: Able to explain home exercise prescription to exercise independently                Exercise Goals Re-Evaluation :  Exercise Goals Re-Evaluation     Row Name 12/30/23 1407             Exercise Goal Re-Evaluation  Exercise Goals Review Able to understand and use rate of perceived exertion (RPE) scale;Increase Physical Activity;Knowledge and understanding of Target Heart Rate Range (THRR);Understanding of Exercise Prescription;Increase Strength and Stamina;Able to check pulse independently       Comments Reviewed RPE and dyspnea scale, THR and program prescription with pt today.  Pt voiced understanding and was given a copy of goals to take home.       Expected Outcomes Short: Use RPE daily to regulate intensity.  Long: Follow program prescription in THR.                Discharge Exercise Prescription (Final Exercise Prescription Changes):  Exercise Prescription Changes - 12/26/23 1600       Response to Exercise   Blood Pressure (Admit) 126/66    Blood Pressure (Exercise) 160/60    Blood Pressure (Exit) 136/70    Heart Rate (Admit) 62 bpm    Heart Rate (Exercise) 93 bpm    Heart Rate (Exit) 52 bpm    Oxygen Saturation (Admit) 88 %    Oxygen Saturation (Exercise) 83 %    Oxygen Saturation (Exit) 94 %    Rating of Perceived Exertion (Exercise) 17    Perceived Dyspnea (Exercise) 3    Symptoms Bilateral hip pain 4/10    Comments results    Duration Progress to 30 minutes of  aerobic without signs/symptoms of physical distress    Intensity THRR New      Progression   Progression Continue to progress workloads to maintain intensity without  signs/symptoms of physical distress.    Average METs 2.59             Nutrition:  Target Goals: Understanding of nutrition guidelines, daily intake of sodium 1500mg , cholesterol 200mg , calories 30% from fat and 7% or less from saturated fats, daily to have 5 or more servings of fruits and vegetables.  Education: All About Nutrition: -Group instruction provided by verbal, written material, interactive activities, discussions, models, and posters to present general guidelines for heart healthy nutrition including fat, fiber, MyPlate, the role of sodium in heart healthy nutrition, utilization of the nutrition label, and utilization of this knowledge for meal planning. Follow up email sent as well. Written material given at graduation.   Biometrics:  Pre Biometrics - 12/26/23 1614       Pre Biometrics   Height 5' 5.7" (1.669 m)    Weight 177 lb 3.2 oz (80.4 kg)    Waist Circumference 45 inches    Hip Circumference 42 inches    Waist to Hip Ratio 1.07 %    BMI (Calculated) 28.85    Single Leg Stand 30 seconds              Nutrition Therapy Plan and Nutrition Goals:  Nutrition Therapy & Goals - 12/26/23 1617       Nutrition Therapy   RD appointment deferred Yes      Intervention Plan   Intervention Prescribe, educate and counsel regarding individualized specific dietary modifications aiming towards targeted core components such as weight, hypertension, lipid management, diabetes, heart failure and other comorbidities.    Expected Outcomes Short Term Goal: Understand basic principles of dietary content, such as calories, fat, sodium, cholesterol and nutrients.             Nutrition Assessments:  MEDIFICTS Score Key: >=70 Need to make dietary changes  40-70 Heart Healthy Diet <= 40 Therapeutic Level Cholesterol Diet   Picture Your Plate Scores: <16 Unhealthy dietary  pattern with much room for improvement. 41-50 Dietary pattern unlikely to meet recommendations  for good health and room for improvement. 51-60 More healthful dietary pattern, with some room for improvement.  >60 Healthy dietary pattern, although there may be some specific behaviors that could be improved.   Nutrition Goals Re-Evaluation:   Nutrition Goals Discharge (Final Nutrition Goals Re-Evaluation):   Psychosocial: Target Goals: Acknowledge presence or absence of significant depression and/or stress, maximize coping skills, provide positive support system. Participant is able to verbalize types and ability to use techniques and skills needed for reducing stress and depression.   Education: Stress, Anxiety, and Depression - Group verbal and visual presentation to define topics covered.  Reviews how body is impacted by stress, anxiety, and depression.  Also discusses healthy ways to reduce stress and to treat/manage anxiety and depression.  Written material given at graduation.   Education: Sleep Hygiene -Provides group verbal and written instruction about how sleep can affect your health.  Define sleep hygiene, discuss sleep cycles and impact of sleep habits. Review good sleep hygiene tips.    Initial Review & Psychosocial Screening:  Initial Psych Review & Screening - 12/23/23 1114       Initial Review   Current issues with Current Psychotropic Meds;Current Depression;History of Depression      Family Dynamics   Good Support System? Yes    Comments She can look to her son and daughters for support. 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      Barriers   Psychosocial barriers to participate in program The patient should benefit from training in stress management and relaxation.      Screening Interventions   Interventions Encouraged to exercise;Program counselor consult;To provide support and resources with identified psychosocial needs;Provide feedback about the scores to participant    Expected Outcomes Short Term goal: Utilizing  psychosocial counselor, staff and physician to assist with identification of specific Stressors or current issues interfering with healing process. Setting desired goal for each stressor or current issue identified.;Long Term Goal: Stressors or current issues are controlled or eliminated.;Short Term goal: Identification and review with participant of any Quality of Life or Depression concerns found by scoring the questionnaire.;Long Term goal: The participant improves quality of Life and PHQ9 Scores as seen by post scores and/or verbalization of changes             Quality of Life Scores:  Scores of 19 and below usually indicate a poorer quality of life in these areas.  A difference of  2-3 points is a clinically meaningful difference.  A difference of 2-3 points in the total score of the Quality of Life Index has been associated with significant improvement in overall quality of life, self-image, physical symptoms, and general health in studies assessing change in quality of life.  PHQ-9: Review Flowsheet  More data exists      12/26/2023 02/07/2023 01/05/2022 09/11/2021 03/22/2021  Depression screen PHQ 2/9  Decreased Interest 1 0 0 0 0  Down, Depressed, Hopeless 1 1 0 0 0  PHQ - 2 Score 2 1 0 0 0  Altered sleeping 3 1 - 1 -  Tired, decreased energy 1 1 - 0 -  Change in appetite 0 0 - 0 -  Feeling bad or failure about yourself  2 0 - 0 -  Trouble concentrating 0 1 - 0 -  Moving slowly or fidgety/restless 1 0 - 0 -  Suicidal thoughts 0  0 - 0 -  PHQ-9 Score 9 4 - 1 -  Difficult doing work/chores Somewhat difficult Not difficult at all - Somewhat difficult -   Interpretation of Total Score  Total Score Depression Severity:  1-4 = Minimal depression, 5-9 = Mild depression, 10-14 = Moderate depression, 15-19 = Moderately severe depression, 20-27 = Severe depression   Psychosocial Evaluation and Intervention:  Psychosocial Evaluation - 12/23/23 1116       Psychosocial Evaluation &  Interventions   Interventions Encouraged to exercise with the program and follow exercise prescription;Relaxation education    Comments She can look to her son and daughters for support. 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    Expected Outcomes Short: Attend [facility] regularly to improve shortness of breath with ADL's. Long: maintain independence with ADL's    Continue Psychosocial Services  Follow up required by staff             Psychosocial Re-Evaluation:   Psychosocial Discharge (Final Psychosocial Re-Evaluation):   Education: Education Goals: Education classes will be provided on a weekly basis, covering required topics. Participant will state understanding/return demonstration of topics presented.  Learning Barriers/Preferences:  Learning Barriers/Preferences - 12/23/23 1113       Learning Barriers/Preferences   Learning Barriers None    Learning Preferences None             General Pulmonary Education Topics:  Infection Prevention: - Provides verbal and written material to individual with discussion of infection control including proper hand washing and proper equipment cleaning during exercise session. Flowsheet Row Pulmonary Rehab from 12/26/2023 in [facility] Cardiac and Pulmonary Rehab  Date 12/26/23  Educator MB  Instruction Review Code 1- Verbalizes Understanding       Falls Prevention: - Provides verbal and written material to individual with discussion of falls prevention and safety. Flowsheet Row Pulmonary Rehab from 12/26/2023 in [facility] Cardiac and Pulmonary Rehab  Date 12/26/23  Educator MB  Instruction Review Code 1- Verbalizes Understanding       Chronic Lung Disease Review: - Group verbal instruction with posters, models, PowerPoint presentations and videos,  to review new updates, new respiratory medications, new advancements in procedures and treatments. Providing information on websites and "800"  numbers for continued self-education. Includes information about supplement oxygen, available portable oxygen systems, continuous and intermittent flow rates, oxygen safety, concentrators, and Medicare reimbursement for oxygen. Explanation of Pulmonary Drugs, including class, frequency, complications, importance of spacers, rinsing mouth after steroid MDI's, and proper cleaning methods for nebulizers. Review of basic lung anatomy and physiology related to function, structure, and complications of lung disease. Review of risk factors. Discussion about methods for diagnosing sleep apnea and types of masks and machines for OSA. Includes a review of the use of types of environmental controls: home humidity, furnaces, filters, dust mite/pet prevention, HEPA vacuums. Discussion about weather changes, air quality and the benefits of nasal washing. Instruction on Warning signs, infection symptoms, calling MD promptly, preventive modes, and value of vaccinations. Review of effective airway clearance, coughing and/or vibration techniques. Emphasizing that all should Create an Action Plan. Written material given at graduation.   AED/CPR: - Group verbal and written instruction with the use of models to demonstrate the basic use of the AED with the basic ABC's of resuscitation.    Anatomy and Cardiac Procedures: - Group verbal and visual presentation and models provide information about basic cardiac anatomy and function. Reviews the testing methods done to diagnose heart  disease and the outcomes of the test results. Describes the treatment choices: Medical Management, Angioplasty, or Coronary Bypass Surgery for treating various heart conditions including Myocardial Infarction, Angina, Valve Disease, and Cardiac Arrhythmias.  Written material given at graduation.   Medication Safety: - Group verbal and visual instruction to review commonly prescribed medications for heart and lung disease. Reviews the medication,  class of the drug, and side effects. Includes the steps to properly store meds and maintain the prescription regimen.  Written material given at graduation.   Other: -Provides group and verbal instruction on various topics (see comments)   Knowledge Questionnaire Score:    Core Components/Risk Factors/Patient Goals at Admission:  Personal Goals and Risk Factors at Admission - 12/26/23 1617       Core Components/Risk Factors/Patient Goals on Admission    Weight Management Yes;Weight Loss    Intervention Weight Management: Develop a combined nutrition and exercise program designed to reach desired caloric intake, while maintaining appropriate intake of nutrient and fiber, sodium and fats, and appropriate energy expenditure required for the weight goal.;Weight Management: Provide education and appropriate resources to help participant work on and attain dietary goals.;Weight Management/Obesity: Establish reasonable short term and long term weight goals.    Admit Weight 177 lb 3.2 oz (80.4 kg)    Goal Weight: Short Term 160 lb (72.6 kg)    Goal Weight: Long Term 150 lb (68 kg)    Expected Outcomes Short Term: Continue to assess and modify interventions until short term weight is achieved;Weight Maintenance: Understanding of the daily nutrition guidelines, which includes 25-35% calories from fat, 7% or less cal from saturated fats, less than 200mg  cholesterol, less than 1.5gm of sodium, & 5 or more servings of fruits and vegetables daily;Weight Loss: Understanding of general recommendations for a balanced deficit meal plan, which promotes 1-2 lb weight loss per week and includes a negative energy balance of [phone] kcal/d;Understanding recommendations for meals to include 15-35% energy as protein, 25-35% energy from fat, 35-60% energy from carbohydrates, less than 200mg  of dietary cholesterol, 20-35 gm of total fiber daily;Understanding of distribution of calorie intake throughout the day with the  consumption of 4-5 meals/snacks    Improve shortness of breath with ADL's Yes    Intervention Provide education, individualized exercise plan and daily activity instruction to help decrease symptoms of SOB with activities of daily living.    Expected Outcomes Short Term: Improve cardiorespiratory fitness to achieve a reduction of symptoms when performing ADLs;Long Term: Be able to perform more ADLs without symptoms or delay the onset of symptoms    Hypertension Yes    Intervention Provide education on lifestyle modifcations including regular physical activity/exercise, weight management, moderate sodium restriction and increased consumption of fresh fruit, vegetables, and low fat dairy, alcohol moderation, and smoking cessation.;Monitor prescription use compliance.    Expected Outcomes Short Term: Continued assessment and intervention until BP is < 140/72mm HG in hypertensive participants. < 130/32mm HG in hypertensive participants with diabetes, heart failure or chronic kidney disease.;Long Term: Maintenance of blood pressure at goal levels.    Lipids Yes    Intervention Provide education and support for participant on nutrition & aerobic/resistive exercise along with prescribed medications to achieve LDL 70mg , HDL >40mg .    Expected Outcomes Short Term: Participant states understanding of desired cholesterol values and is compliant with medications prescribed. Participant is following exercise prescription and nutrition guidelines.;Long Term: Cholesterol controlled with medications as prescribed, with individualized exercise RX and with personalized nutrition plan. Value goals:  LDL < 70mg , HDL > 40 mg.             Education:Diabetes - Individual verbal and written instruction to review signs/symptoms of diabetes, desired ranges of glucose level fasting, after meals and with exercise. Acknowledge that pre and post exercise glucose checks will be done for 3 sessions at entry of program.   Know  Your Numbers and Heart Failure: - Group verbal and visual instruction to discuss disease risk factors for cardiac and pulmonary disease and treatment options.  Reviews associated critical values for Overweight/Obesity, Hypertension, Cholesterol, and Diabetes.  Discusses basics of heart failure: signs/symptoms and treatments.  Introduces Heart Failure Zone chart for action plan for heart failure.  Written material given at graduation.   Core Components/Risk Factors/Patient Goals Review:    Core Components/Risk Factors/Patient Goals at Discharge (Final Review):    ITP Comments:  ITP Comments     Row Name 12/23/23 1112 12/26/23 1606 12/30/23 1405 01/08/24 0818     ITP Comments Virtual Visit completed. Patient informed on EP and RD appointment and 6 Minute walk test. Patient also informed of patient health questionnaires on My Chart. Patient Verbalizes understanding. Visit diagnosis can be found in [facility] 10/18/2023. Completed and gym orientation. Initial ITP created and sent for review to Dr. Jinny Sanders, Medical Director. First full day of exercise!  Patient was oriented to gym and equipment including functions, settings, policies, and procedures.  Patient's individual exercise prescription and treatment plan were reviewed.  All starting workloads were established based on the results of the 6 minute walk test done at initial orientation visit.  The plan for exercise progression was also introduced and progression will be customized based on patient's performance and goals. 30 Day review completed. Medical Director ITP review done, changes made as directed, and signed approval by Medical Director. New to program.             Comments: 30 Day review completed. Medical Director ITP review done, changes made as directed, and signed approval by Medical Director. New to program.

## 2024-01-13 ENCOUNTER — Encounter: Payer: Medicare PPO | Admitting: *Deleted

## 2024-01-13 DIAGNOSIS — J439 Emphysema, unspecified: Secondary | ICD-10-CM | POA: Diagnosis not present

## 2024-01-13 NOTE — Progress Notes (Signed)
 Daily Session Note  Patient Details  Name: Kristin Parker MRN: [id number] Date of Birth: 06/27/1951 Referring Provider:   Flowsheet Row Pulmonary Rehab from 12/26/2023 in [facility] Cardiac and Pulmonary Rehab  Referring Provider Raechel Chute, MD       Encounter Date: 01/13/2024  Check In:  Session Check In - 01/13/24 1350       Check-In   Supervising physician immediately available to respond to emergencies See telemetry face sheet for immediately available ER MD    Location [facility]-Cardiac & Pulmonary Rehab    Staff Present Susann Givens RN,BSN;Joseph Weyman Pedro, [place], Exercise Physiologist;Laureen Manson Passey, [place], RRT, CPFT    Virtual Visit No    Medication changes reported     No    Fall or balance concerns reported    No    Warm-up and Cool-down Performed on first and last piece of equipment    Resistance Training Performed Yes    VAD Patient? No    PAD/SET Patient? No      Pain Assessment   Currently in Pain? No/denies                Social History   Tobacco Use  Smoking Status Former   Current packs/day: 0.00   Average packs/day: 0.5 packs/day for 30.0 years (15.0 ttl pk-yrs)   Types: Cigarettes   Start date: 10/30/1990   Quit date: 10/30/2020   Years since quitting: 3.2  Smokeless Tobacco Never  Tobacco Comments   Quit Jan 2022    Goals Met:  Independence with exercise equipment Exercise tolerated well No report of concerns or symptoms today Strength training completed today  Goals Unmet:  Not Applicable  Comments: Pt able to follow exercise prescription today without complaint.  Will continue to monitor for progression.    Dr. Bethann Punches is Medical Director for [facility] Cardiac Rehabilitation.  Dr. Vida Rigger is Medical Director for [facility] Pulmonary Rehabilitation.

## 2024-01-16 ENCOUNTER — Encounter: Admitting: *Deleted

## 2024-01-16 DIAGNOSIS — J439 Emphysema, unspecified: Secondary | ICD-10-CM

## 2024-01-16 NOTE — Progress Notes (Signed)
 Daily Session Note  Patient Details  Name: Kristin Parker MRN: [id number] Date of Birth: 14-Oct-1951 Referring Provider:   Flowsheet Row Pulmonary Rehab from 12/26/2023 in [facility] Cardiac and Pulmonary Rehab  Referring Provider Raechel Chute, MD       Encounter Date: 01/16/2024  Check In:  Session Check In - 01/16/24 1122       Check-In   Supervising physician immediately available to respond to emergencies See telemetry face sheet for immediately available ER MD    Location [facility]-Cardiac & Pulmonary Rehab    Staff Present Cora Collum, RN, BSN, CCRP;Meredith Jewel Baize RN,BSN;Joseph [employer], MS, Exercise Physiologist;Jason Wallace Cullens RDN,LDN    Virtual Visit No    Medication changes reported     No    Fall or balance concerns reported    No    Warm-up and Cool-down Performed on first and last piece of equipment    Resistance Training Performed Yes    VAD Patient? No    PAD/SET Patient? No      Pain Assessment   Currently in Pain? No/denies                Social History   Tobacco Use  Smoking Status Former   Current packs/day: 0.00   Average packs/day: 0.5 packs/day for 30.0 years (15.0 ttl pk-yrs)   Types: Cigarettes   Start date: 10/30/1990   Quit date: 10/30/2020   Years since quitting: 3.2  Smokeless Tobacco Never  Tobacco Comments   Quit Jan 2022    Goals Met:  Proper associated with RPD/PD & O2 Sat Independence with exercise equipment Exercise tolerated well No report of concerns or symptoms today  Goals Unmet:  Not Applicable  Comments: Pt able to follow exercise prescription today without complaint.  Will continue to monitor for progression.    Dr. Bethann Punches is Medical Director for [facility] Cardiac Rehabilitation.  Dr. Vida Rigger is Medical Director for [facility] Pulmonary Rehabilitation.

## 2024-01-21 ENCOUNTER — Encounter: Admitting: *Deleted

## 2024-01-21 DIAGNOSIS — J439 Emphysema, unspecified: Secondary | ICD-10-CM | POA: Diagnosis not present

## 2024-01-21 NOTE — Progress Notes (Signed)
 Daily Session Note  Patient Details  Name: DEMETRA MOYA MRN: [id number] Date of Birth: 1951/07/27 Referring Provider:   Flowsheet Row Pulmonary Rehab from 12/26/2023 in [facility] Cardiac and Pulmonary Rehab  Referring Provider Raechel Chute, MD       Encounter Date: 01/21/2024  Check In:  Session Check In - 01/21/24 1126       Check-In   Supervising physician immediately available to respond to emergencies See telemetry face sheet for immediately available ER MD    Location [facility]-Cardiac & Pulmonary Rehab    Staff Present Balinda Quails RDN,LDN;Noah Tickle, BS, Exercise Physiologist;Maxon Conetta BS, Exercise Physiologist;Margaret Best, MS, Exercise Physiologist;Dexter Sauser, RN, BSN, CCRP;Meredith Jewel Baize RN,BSN    Virtual Visit No    Medication changes reported     No    Fall or balance concerns reported    No    Warm-up and Cool-down Performed on first and last piece of equipment    Resistance Training Performed Yes    VAD Patient? No    PAD/SET Patient? No      Pain Assessment   Currently in Pain? No/denies                Social History   Tobacco Use  Smoking Status Former   Current packs/day: 0.00   Average packs/day: 0.5 packs/day for 30.0 years (15.0 ttl pk-yrs)   Types: Cigarettes   Start date: 10/30/1990   Quit date: 10/30/2020   Years since quitting: 3.2  Smokeless Tobacco Never  Tobacco Comments   Quit Jan 2022    Goals Met:  Proper associated with RPD/PD & O2 Sat Exercise tolerated well Personal goals reviewed No report of concerns or symptoms today  Goals Unmet:  Not Applicable  Comments: Pt able to follow exercise prescription today without complaint.  Will continue to monitor for progression.  Dr. Bethann Punches is Medical Director for [facility] Cardiac Rehabilitation.  Dr. Vida Rigger is Medical Director for [facility] Pulmonary Rehabilitation.

## 2024-01-22 ENCOUNTER — Other Ambulatory Visit: Payer: Self-pay | Admitting: Family Medicine

## 2024-01-22 ENCOUNTER — Other Ambulatory Visit: Payer: Self-pay | Admitting: Internal Medicine

## 2024-01-22 DIAGNOSIS — L299 Pruritus, unspecified: Secondary | ICD-10-CM

## 2024-01-22 DIAGNOSIS — I25118 Atherosclerotic heart disease of native coronary artery with other forms of angina pectoris: Secondary | ICD-10-CM

## 2024-01-22 DIAGNOSIS — G8929 Other chronic pain: Secondary | ICD-10-CM

## 2024-01-23 ENCOUNTER — Encounter: Admitting: *Deleted

## 2024-01-23 DIAGNOSIS — J439 Emphysema, unspecified: Secondary | ICD-10-CM

## 2024-01-23 NOTE — Progress Notes (Signed)
 Daily Session Note  Patient Details  Name: ALANII RAMER MRN: [id number] Date of Birth: [birth year] Referring Provider:   Doristine Devoid Pulmonary Rehab from 12/26/2023 in [facility] Cardiac and Pulmonary Rehab  Referring Provider Raechel Chute, MD       Encounter Date: 01/23/2024  Check In:  Session Check In - 01/23/24 1302       Check-In   Supervising physician immediately available to respond to emergencies See telemetry face sheet for immediately available ER MD    Location [facility]-Cardiac & Pulmonary Rehab    Staff Present Rory Percy, MS, Exercise Physiologist;Maxon Suzzette Righter, Exercise Physiologist;Meredith Jewel Baize RN,BSN;[employer];Cora Collum, RN, BSN, CCRP    Virtual Visit No    Medication changes reported     No    Fall or balance concerns reported    No    Warm-up and Cool-down Performed on first and last piece of equipment    Resistance Training Performed Yes    VAD Patient? No    PAD/SET Patient? No      Pain Assessment   Currently in Pain? No/denies                Social History   Tobacco Use  Smoking Status Former   Current packs/day: 0.00   Average packs/day: 0.5 packs/day for 30.0 years (15.0 ttl pk-yrs)   Types: Cigarettes   Start date: 10/30/1990   Quit date: 10/30/2020   Years since quitting: 3.2  Smokeless Tobacco Never  Tobacco Comments   Quit Jan 2022    Goals Met:  Proper associated with RPD/PD & O2 Sat Independence with exercise equipment Exercise tolerated well No report of concerns or symptoms today  Goals Unmet:  Not Applicable  Comments: Pt able to follow exercise prescription today without complaint.  Will continue to monitor for progression.    Dr. Bethann Punches is Medical Director for [facility] Cardiac Rehabilitation.  Dr. Vida Rigger is Medical Director for [facility] Pulmonary Rehabilitation.

## 2024-01-23 NOTE — Telephone Encounter (Signed)
 Requested medication (s) are due for refill today: yes  Requested medication (s) are on the active medication list: yes  Last refill:  12/18/23 #68mL/0  Future visit scheduled: no  Notes to clinic:  unsure if pt needed to titrate up to next dose or continue current one.      Requested Prescriptions  Pending Prescriptions Disp Refills   WEGOVY 0.25 MG/0.5ML SOAJ [Pharmacy Med Name: WEGOVY 0.25 MG/0.5ML SUBQ SOLN ML] 2 mL 0    Sig: INJECT 0.25MG  SUBCUTANEOUSLY ONCE A WEEK     Endocrinology:  Diabetes - GLP-1 Receptor Agonists - semaglutide Failed - 01/23/2024  4:13 PM      Failed - HBA1C in normal range and within 180 days    Hgb A1c MFr Bld  Date Value Ref Range Status  09/11/2023 5.8 (H) <5.7 % of total Hgb Final    Comment:    For someone without known diabetes, a hemoglobin  A1c value between 5.7% and 6.4% is consistent with prediabetes and should be confirmed with a  follow-up test. . For someone with known diabetes, a value <7% indicates that their diabetes is well controlled. A1c targets should be individualized based on duration of diabetes, age, comorbid conditions, and other considerations. . This assay result is consistent with an increased risk of diabetes. . Currently, no consensus exists regarding use of hemoglobin A1c for diagnosis of diabetes for children. .          Failed - Cr in normal range and within 360 days    Creat  Date Value Ref Range Status  09/11/2023 1.03 (H) 0.60 - 1.00 mg/dL Final         Passed - Valid encounter within last 6 months    Recent Outpatient Visits           1 month ago Atrophic vaginitis   [place] [place], Netta Neat, DO              Signed Prescriptions Disp Refills   meloxicam (MOBIC) 15 MG tablet 30 tablet 5    Sig: TAKE 1 TABLET BY MOUTH ONCE DAILY AS NEEDED FOR PAIN WITH FOOD     Analgesics:  COX2 Inhibitors Failed - 01/23/2024  4:13 PM      Failed - Manual Review:  Labs are only required if the patient has taken medication for more than 8 weeks.      Failed - Cr in normal range and within 360 days    Creat  Date Value Ref Range Status  09/11/2023 1.03 (H) 0.60 - 1.00 mg/dL Final         Failed - HCT in normal range and within 360 days    HCT  Date Value Ref Range Status  09/11/2023 45.1 (H) 35.0 - 45.0 % Final   Hematocrit  Date Value Ref Range Status  07/22/2019 42.1 34.0 - 46.6 % Final         Passed - HGB in normal range and within 360 days    Hemoglobin  Date Value Ref Range Status  09/11/2023 15.2 11.7 - 15.5 g/dL Final  16/07/9603 54.0 11.1 - 15.9 g/dL Final         Passed - AST in normal range and within 360 days    AST  Date Value Ref Range Status  09/11/2023 16 10 - 35 U/L Final         Passed - ALT in normal range and within 360 days  ALT  Date Value Ref Range Status  09/11/2023 18 6 - 29 U/L Final         Passed - eGFR is 30 or above and within 360 days    GFR, Est African American  Date Value Ref Range Status  03/01/2021 69 > OR = 60 mL/min/1.18m2 Final   GFR, Est Non African American  Date Value Ref Range Status  03/01/2021 60 > OR = 60 mL/min/1.71m2 Final   GFR, Estimated  Date Value Ref Range Status  10/17/2022 59 (L) >60 mL/min Final    Comment:    (NOTE) Calculated using the CKD-EPI Creatinine Equation (2021)    eGFR  Date Value Ref Range Status  09/11/2023 57 (L) > OR = 60 mL/min/1.84m2 Final         Passed - Patient is not pregnant      Passed - Valid encounter within last 12 months    Recent Outpatient Visits           1 month ago Atrophic vaginitis   [place] [place], Netta Neat, DO               hydrOXYzine (VISTARIL) 25 MG capsule 270 capsule 0    Sig: TAKE 1 CAPSULE BY MOUTH EVERY 8 HOURS ASNEEDED     Ear, Nose, and Throat:  Antihistamines 2 Failed - 01/23/2024  4:13 PM      Failed - Cr in normal range and within 360 days    Creat  Date  Value Ref Range Status  09/11/2023 1.03 (H) 0.60 - 1.00 mg/dL Final         Passed - Valid encounter within last 12 months    Recent Outpatient Visits           1 month ago Atrophic vaginitis   [place] [place], Netta Neat, [place]

## 2024-01-23 NOTE — Telephone Encounter (Signed)
 Requested Prescriptions  Pending Prescriptions Disp Refills   WEGOVY 0.25 MG/0.5ML SOAJ [Pharmacy Med Name: WEGOVY 0.25 MG/0.5ML SUBQ SOLN ML] 2 mL 0    Sig: INJECT 0.25MG  SUBCUTANEOUSLY ONCE A WEEK     Endocrinology:  Diabetes - GLP-1 Receptor Agonists - semaglutide Failed - 01/23/2024  4:13 PM      Failed - HBA1C in normal range and within 180 days    Hgb A1c MFr Bld  Date Value Ref Range Status  09/11/2023 5.8 (H) <5.7 % of total Hgb Final    Comment:    For someone without known diabetes, a hemoglobin  A1c value between 5.7% and 6.4% is consistent with prediabetes and should be confirmed with a  follow-up test. . For someone with known diabetes, a value <7% indicates that their diabetes is well controlled. A1c targets should be individualized based on duration of diabetes, age, comorbid conditions, and other considerations. . This assay result is consistent with an increased risk of diabetes. . Currently, no consensus exists regarding use of hemoglobin A1c for diagnosis of diabetes for children. .          Failed - Cr in normal range and within 360 days    Creat  Date Value Ref Range Status  09/11/2023 1.03 (H) 0.60 - 1.00 mg/dL Final         Passed - Valid encounter within last 6 months    Recent Outpatient Visits           1 month ago Atrophic vaginitis   [place] [facility] [place], Netta Neat, DO               meloxicam (MOBIC) 15 MG tablet [Pharmacy Med Name: MELOXICAM 15 MG TAB] 30 tablet 5    Sig: TAKE 1 TABLET BY MOUTH ONCE DAILY AS NEEDED FOR PAIN WITH FOOD     Analgesics:  COX2 Inhibitors Failed - 01/23/2024  4:13 PM      Failed - Manual Review: Labs are only required if the patient has taken medication for more than 8 weeks.      Failed - Cr in normal range and within 360 days    Creat  Date Value Ref Range Status  09/11/2023 1.03 (H) 0.60 - 1.00 mg/dL Final         Failed - HCT in normal range and within 360 days     HCT  Date Value Ref Range Status  09/11/2023 45.1 (H) 35.0 - 45.0 % Final   Hematocrit  Date Value Ref Range Status  07/22/2019 42.1 34.0 - 46.6 % Final         Passed - HGB in normal range and within 360 days    Hemoglobin  Date Value Ref Range Status  09/11/2023 15.2 11.7 - 15.5 g/dL Final  16/07/9603 54.0 11.1 - 15.9 g/dL Final         Passed - AST in normal range and within 360 days    AST  Date Value Ref Range Status  09/11/2023 16 10 - 35 U/L Final         Passed - ALT in normal range and within 360 days    ALT  Date Value Ref Range Status  09/11/2023 18 6 - 29 U/L Final         Passed - eGFR is 30 or above and within 360 days    GFR, Est African American  Date Value Ref Range Status  03/01/2021 69 > OR =  60 mL/min/1.7m2 Final   GFR, Est Non African American  Date Value Ref Range Status  03/01/2021 60 > OR = 60 mL/min/1.12m2 Final   GFR, Estimated  Date Value Ref Range Status  10/17/2022 59 (L) >60 mL/min Final    Comment:    (NOTE) Calculated using the CKD-EPI Creatinine Equation (2021)    eGFR  Date Value Ref Range Status  09/11/2023 57 (L) > OR = 60 mL/min/1.48m2 Final         Passed - Patient is not pregnant      Passed - Valid encounter within last 12 months    Recent Outpatient Visits           1 month ago Atrophic vaginitis   [place] [facility] [place], Netta Neat, DO               hydrOXYzine (VISTARIL) 25 MG capsule [Pharmacy Med Name: HYDROXYZINE PAMOATE 25 MG CAP] 90 capsule 2    Sig: TAKE 1 CAPSULE BY MOUTH EVERY 8 HOURS ASNEEDED     Ear, Nose, and Throat:  Antihistamines 2 Failed - 01/23/2024  4:13 PM      Failed - Cr in normal range and within 360 days    Creat  Date Value Ref Range Status  09/11/2023 1.03 (H) 0.60 - 1.00 mg/dL Final         Passed - Valid encounter within last 12 months    Recent Outpatient Visits           1 month ago Atrophic vaginitis   [facility] [place], Netta Neat, [place]

## 2024-01-24 MED ORDER — WEGOVY 0.5 MG/0.5ML ~~LOC~~ SOAJ: 0.5000 mg | SUBCUTANEOUS | 2 refills | Status: DC

## 2024-01-28 ENCOUNTER — Encounter: Payer: Self-pay | Admitting: Student

## 2024-01-28 ENCOUNTER — Encounter

## 2024-01-30 ENCOUNTER — Encounter: Attending: Student in an Organized Health Care Education/Training Program

## 2024-01-30 DIAGNOSIS — J439 Emphysema, unspecified: Secondary | ICD-10-CM | POA: Insufficient documentation

## 2024-02-04 ENCOUNTER — Telehealth: Payer: Self-pay | Admitting: *Deleted

## 2024-02-04 ENCOUNTER — Telehealth: Payer: Self-pay

## 2024-02-04 ENCOUNTER — Encounter

## 2024-02-04 NOTE — Telephone Encounter (Signed)
 We called pt today as she did not attend her appointment at 11:00. Pt stated that she thought she may no longer be in the program after missing class. We reviewed our attendance policy on the phone and she stated that she will return to rehab on 02/06/2024.

## 2024-02-04 NOTE — Telephone Encounter (Signed)
 Absent since 3/27. LMOM to call us

## 2024-02-05 ENCOUNTER — Encounter: Payer: Self-pay | Admitting: *Deleted

## 2024-02-05 DIAGNOSIS — J439 Emphysema, unspecified: Secondary | ICD-10-CM

## 2024-02-05 NOTE — Progress Notes (Signed)
 Pulmonary Individual Treatment Plan  Patient Details  Name: Kristin Parker MRN: [id number] Date of Birth: Mar 15, 1951 Referring Provider:   Flowsheet Row Pulmonary Rehab from 12/26/2023 in [facility] Cardiac and Pulmonary Rehab  Referring Provider Raechel Chute, MD       Initial Encounter Date:  Flowsheet Row Pulmonary Rehab from 12/26/2023 in [facility] Cardiac and Pulmonary Rehab  Date 12/26/23       Visit Diagnosis: Pulmonary emphysema, unspecified emphysema type (HCC)  Patient's Home Medications on Admission:  Current Outpatient Medications:    REPATHA SURECLICK 140 MG/ML SOAJ, INJECT 1 DOSE INTO THE SKIN EVERY 14 DAYS, Disp: 6 mL, Rfl: 0   acetaminophen (TYLENOL) 500 MG tablet, Take 2 tablets (1,000 mg total) by mouth every 6 (six) hours as needed for mild pain., Disp: , Rfl:    acetic acid-hydrocortisone (VOSOL-HC) OTIC solution, Place 3 drops into both ears 3 (three) times daily. May use wick for first 24 hours (Patient not taking: Reported on 12/23/2023), Disp: 10 mL, Rfl: 0   albuterol (VENTOLIN HFA) 108 (90 Base) MCG/ACT inhaler, INHALE 2 PUFFS EVERY 4 HOURS FOR WHEEZING OR SHORTNESS OF BREATH AS NEEDED, Disp: 18 g, Rfl: 3   amLODipine (NORVASC) 10 MG tablet, TAKE 1 TABLET BY MOUTH ONCE DAILY, Disp: 90 tablet, Rfl: 1   aspirin EC 81 MG tablet, Take 1 tablet (81 mg total) by mouth daily., Disp: , Rfl:    busPIRone (BUSPAR) 5 MG tablet, Take 1 tablet (5 mg total) by mouth 2 (two) times daily., Disp: 180 tablet, Rfl: 3   estradiol (ESTRACE) 0.1 MG/GM vaginal cream, Apply 1 gram per vagina every night for 2 weeks, then apply three times a week, Disp: 42.5 g, Rfl: 5   famotidine (PEPCID) 20 MG tablet, TAKE 1 TABLET BY MOUTH TWICE DAILY, Disp: 180 tablet, Rfl: 3   fluticasone (FLONASE) 50 MCG/ACT nasal spray, USE 2 SPRAYS INTO EACH NOSTRIL ONCE DAILY, Disp: 48 g, Rfl: 0   Fluticasone-Umeclidin-Vilant (TRELEGY ELLIPTA) 200-62.5-25 MCG/ACT AEPB, Inhale 1 Inhalation into the lungs daily., Disp: 30  each, Rfl: 12   hydrOXYzine (VISTARIL) 25 MG capsule, TAKE 1 CAPSULE BY MOUTH EVERY 8 HOURS ASNEEDED, Disp: 270 capsule, Rfl: 0   icosapent Ethyl (VASCEPA) 1 g capsule, Take 2 capsules (2 g total) by mouth 2 (two) times daily. (Patient not taking: Reported on 12/23/2023), Disp: 360 capsule, Rfl: 3   isosorbide mononitrate (IMDUR) 60 MG 24 hr tablet, TAKE 1 TABLET BY MOUTH ONCE DAILY, Disp: 30 tablet, Rfl: 11   losartan (COZAAR) 100 MG tablet, Take 1 tablet (100 mg total) by mouth daily., Disp: 90 tablet, Rfl: 1   meloxicam (MOBIC) 15 MG tablet, TAKE 1 TABLET BY MOUTH ONCE DAILY AS NEEDED FOR PAIN WITH FOOD, Disp: 30 tablet, Rfl: 5   metoprolol tartrate (LOPRESSOR) 100 MG tablet, Take 1 tablet (100 mg total) by mouth once for 1 dose. 2 hours prior to CT, Disp: 1 tablet, Rfl: 0   rizatriptan (MAXALT-MLT) 10 MG disintegrating tablet, Take 1 tablet (10 mg total) by mouth as needed for migraine. May repeat in 2 hours if needed (Patient not taking: Reported on 12/23/2023), Disp: 10 tablet, Rfl: 2   sertraline (ZOLOFT) 100 MG tablet, TAKE 1 TABLET BY MOUTH ONCE DAILY, Disp: 90 tablet, Rfl: 1   WEGOVY 0.5 MG/0.5ML SOAJ, Inject 0.5 mg into the skin once a week., Disp: 2 mL, Rfl: 2   zolpidem (AMBIEN CR) 6.25 MG CR tablet, Take 1 tablet (6.25 mg total) by mouth  at bedtime as needed. for sleep, Disp: 30 tablet, Rfl: 5  Past Medical History: Past Medical History:  Diagnosis Date   Anxiety    Aortic valve sclerosis    Bilateral carotid bruits    Carotid artery stenosis    COPD (chronic obstructive pulmonary disease) (HCC)    Hypertension    Mitral regurgitation    Mixed hyperlipidemia    Shoulder pain    Sleeping difficulties     Tobacco Use: Social History   Tobacco Use  Smoking Status Former   Current packs/day: 0.00   Average packs/day: 0.5 packs/day for 30.0 years (15.0 ttl pk-yrs)   Types: Cigarettes   Start date: 10/30/1990   Quit date: 10/30/2020   Years since quitting: 3.2  Smokeless  Tobacco Never  Tobacco Comments   Quit Jan 2022    Labs: Review Flowsheet  More data exists      Latest Ref Rng & Units 03/01/2021 09/06/2021 10/17/2022 02/11/2023 09/11/2023  Labs for ITP Cardiac and Pulmonary Rehab  Cholestrol <200 mg/dL 161  096  045  409  811   LDL (calc) mg/dL (calc) 914  92  92  76  154   HDL-C > OR = 50 mg/dL 32  37  38  34  38   Trlycerides <150 mg/dL 782  956  213  086  578   Hemoglobin A1c <5.7 % of total Hgb 5.7  5.6  - - 5.8      Pulmonary Assessment Scores:  Pulmonary Assessment Scores     Row Name 12/26/23 1616         CAT Score   CAT Score 9       mMRC Score   mMRC Score 3              UCSD: Self-administered rating of dyspnea associated with activities of daily living (ADLs) 6-point scale (0 = "not at all" to 5 = "maximal or unable to do because of breathlessness")  Scoring Scores range from 0 to 120.  Minimally important difference is 5 units  CAT: CAT can identify the health impairment of COPD patients and is better correlated with disease progression.  CAT has a scoring range of zero to 40. The CAT score is classified into four groups of low (less than 10), medium (10 - 20), high (21-30) and very high (31-40) based on the impact level of disease on health status. A CAT score over 10 suggests significant symptoms.  A worsening CAT score could be explained by an exacerbation, poor medication adherence, poor inhaler technique, or progression of COPD or comorbid conditions.  CAT MCID is 2 points  mMRC: mMRC (Modified Medical Research Council) Dyspnea Scale is used to assess the degree of baseline functional disability in patients of respiratory disease due to dyspnea. No minimal important difference is established. A decrease in score of 1 point or greater is considered a positive change.   Pulmonary Function Assessment:  Pulmonary Function Assessment - 12/23/23 1113       Breath   Shortness of Breath Yes;Panic with Shortness of  Breath             Exercise Target Goals: Exercise Program Goal: Individual exercise prescription set using results from initial 6 min walk test and THRR while considering  patient's activity barriers and safety.   Exercise Prescription Goal: Initial exercise prescription builds to 30-45 minutes a day of aerobic activity, 2-3 days per week.  Home exercise guidelines will be given to  patient during program as part of exercise prescription that the participant will acknowledge.  Education: Aerobic Exercise: - Group verbal and visual presentation on the components of exercise prescription. Introduces F.I.T.T principle from ACSM for exercise prescriptions.  Reviews F.I.T.T. principles of aerobic exercise including progression. Written material given at graduation.   Education: Resistance Exercise: - Group verbal and visual presentation on the components of exercise prescription. Introduces F.I.T.T principle from ACSM for exercise prescriptions  Reviews F.I.T.T. principles of resistance exercise including progression. Written material given at graduation.    Education: Exercise & Equipment Safety: - Individual verbal instruction and demonstration of equipment use and safety with use of the equipment. Flowsheet Row Pulmonary Rehab from 12/26/2023 in [facility] Cardiac and Pulmonary Rehab  Date 12/26/23  Educator MB  Instruction Review Code 1- Verbalizes Understanding       Education: Exercise Physiology & General Exercise Guidelines: - Group verbal and written instruction with models to review the exercise physiology of the cardiovascular system and associated critical values. Provides general exercise guidelines with specific guidelines to those with heart or lung disease.    Education: Flexibility, Balance, Mind/Body Relaxation: - Group verbal and visual presentation with interactive activity on the components of exercise prescription. Introduces F.I.T.T principle from ACSM for exercise  prescriptions. Reviews F.I.T.T. principles of flexibility and balance exercise training including progression. Also discusses the mind body connection.  Reviews various relaxation techniques to help reduce and manage stress (i.e. Deep breathing, progressive muscle relaxation, and visualization). Balance handout provided to take home. Written material given at graduation.   Activity Barriers & Risk Stratification:  Activity Barriers & Cardiac Risk Stratification - 12/26/23 1608       Activity Barriers & Cardiac Risk Stratification   Activity Barriers Joint Problems;Arthritis   R shoulder bursitis and OA, Bilateral knee pain (occasionally)            6 Minute Walk:  6 Minute Walk     Row Name 12/26/23 1606         6 Minute Walk   Phase Initial     Distance 1230 feet     Walk Time 6 minutes     # of Rest Breaks 0     MPH 2.32     METS 2.59     RPE 17     Perceived Dyspnea  3     VO2 Peak 9.06     Symptoms Yes (comment)     Comments Bilateral hip pain 4/10     Resting HR 62 bpm     Resting BP 126/66     Resting Oxygen Saturation  88 %     Exercise Oxygen Saturation  during 6 min walk 83 %     Max Ex. HR 93 bpm     Max Ex. BP 160/60     2 Minute Post BP 136/70       Interval HR   1 Minute HR 73     2 Minute HR 80     3 Minute HR 88     4 Minute HR 91     5 Minute HR 91     6 Minute HR 93     2 Minute Post HR 52     Interval Heart Rate? Yes       Interval Oxygen   Interval Oxygen? Yes     Baseline Oxygen Saturation % 88 %     1 Minute Oxygen Saturation % 89 %  1 Minute Liters of Oxygen 0 L     2 Minute Oxygen Saturation % 91 %     2 Minute Liters of Oxygen 0 L     3 Minute Oxygen Saturation % 86 %     3 Minute Liters of Oxygen 0 L     4 Minute Oxygen Saturation % 83 %     4 Minute Liters of Oxygen 0 L     5 Minute Oxygen Saturation % 84 %     5 Minute Liters of Oxygen 0 L     6 Minute Oxygen Saturation % 85 %     6 Minute Liters of Oxygen 0 L     2  Minute Post Oxygen Saturation % 94 %     2 Minute Post Liters of Oxygen 0 L             Oxygen Initial Assessment:  Oxygen Initial Assessment - 12/23/23 1112       Home Oxygen   Home Oxygen Device None    Sleep Oxygen Prescription None    Home Exercise Oxygen Prescription None    Home Resting Oxygen Prescription None      Initial 6 min Walk   Oxygen Used None      Program Oxygen Prescription   Program Oxygen Prescription None      Intervention   Short Term Goals To learn and understand importance of monitoring SPO2 with pulse oximeter and demonstrate accurate use of the pulse oximeter.;To learn and demonstrate proper pursed lip breathing techniques or other breathing techniques. ;To learn and understand importance of maintaining oxygen saturations>88%;To learn and demonstrate proper use of respiratory medications;To learn and exhibit compliance with exercise, home and travel O2 prescription    Long  Term Goals Exhibits compliance with exercise, home  and travel O2 prescription;Maintenance of O2 saturations>88%;Compliance with respiratory medication;Demonstrates proper use of MDI's;Exhibits proper breathing techniques, such as pursed lip breathing or other method taught during program session;Verbalizes importance of monitoring SPO2 with pulse oximeter and return demonstration             Oxygen Re-Evaluation:  Oxygen Re-Evaluation     Row Name 12/30/23 1408 01/21/24 1128           Program Oxygen Prescription   Program Oxygen Prescription -- None        Home Oxygen   Home Oxygen Device -- None      Sleep Oxygen Prescription -- None      Home Exercise Oxygen Prescription -- None      Home Resting Oxygen Prescription -- None      Compliance with Home Oxygen Use -- Yes        Goals/Expected Outcomes   Short Term Goals -- To learn and demonstrate proper pursed lip breathing techniques or other breathing techniques.       Long  Term Goals -- Exhibits proper breathing  techniques, such as pursed lip breathing or other method taught during program session      Comments Reviewed PLB technique with pt.  Talked about how it works and it's importance in maintaining their exercise saturations. Reviewed PLB technique with pt.  Talked about how it works and it's importance in maintaining their exercise saturations.      Short: Become more profiecient at using PLB.   Long: Become independent at using PLB.      Goals/Expected Outcomes Short: Become more profiecient at using PLB.   Long: Become independent at using PLB.  Short: Become more profiecient at using PLB.   Long: Become independent at using PLB.               Oxygen Discharge (Final Oxygen Re-Evaluation):  Oxygen Re-Evaluation - 01/21/24 1128       Program Oxygen Prescription   Program Oxygen Prescription None      Home Oxygen   Home Oxygen Device None    Sleep Oxygen Prescription None    Home Exercise Oxygen Prescription None    Home Resting Oxygen Prescription None    Compliance with Home Oxygen Use Yes      Goals/Expected Outcomes   Short Term Goals To learn and demonstrate proper pursed lip breathing techniques or other breathing techniques.     Long  Term Goals Exhibits proper breathing techniques, such as pursed lip breathing or other method taught during program session    Comments Reviewed PLB technique with pt.  Talked about how it works and it's importance in maintaining their exercise saturations.      Short: Become more profiecient at using PLB.   Long: Become independent at using PLB.    Goals/Expected Outcomes Short: Become more profiecient at using PLB.   Long: Become independent at using PLB.             Initial Exercise Prescription:  Initial Exercise Prescription - 12/26/23 1600       Date of Initial Exercise RX and Referring Provider   Date 12/26/23    Referring Provider Raechel Chute, MD      Oxygen   Maintain Oxygen Saturation 88% or higher      Recumbant Bike    Level 2    RPM 50    Watts 15    Minutes 15    METs 2.59      NuStep   Level 2    SPM 80    Minutes 15    METs 2.59      T5 Nustep   Level 2    SPM 80    Minutes 15    METs 2.59      Biostep-RELP   Level 2    SPM 50    Minutes 15    METs 2.59      Track   Laps 20    Minutes 15    METs 2.09      Prescription Details   Frequency (times per week) 2    Duration Progress to 30 minutes of continuous aerobic without signs/symptoms of physical distress      Intensity   THRR 40-80% of Max Heartrate 95-129    Ratings of Perceived Exertion 11-13    Perceived Dyspnea 0-4      Progression   Progression Continue to progress workloads to maintain intensity without signs/symptoms of physical distress.      Resistance Training   Training Prescription Yes    Weight 5 lb    Reps 10-15             Perform Capillary Blood Glucose checks as needed.  Exercise Prescription Changes:   Exercise Prescription Changes     Row Name 12/26/23 1600 01/16/24 1800 01/29/24 0800         Response to Exercise   Blood Pressure (Admit) 126/66 144/66 114/56     Blood Pressure (Exercise) 160/60 142/60 142/66     Blood Pressure (Exit) 136/70 124/68 106/60     Heart Rate (Admit) 62 bpm 62 bpm 72 bpm  Heart Rate (Exercise) 93 bpm 94 bpm 104 bpm     Heart Rate (Exit) 52 bpm 63 bpm 72 bpm     Oxygen Saturation (Admit) 88 % 92 % 91 %     Oxygen Saturation (Exercise) 83 % 89 % 85 %     Oxygen Saturation (Exit) 94 % 90 % 92 %     Rating of Perceived Exertion (Exercise) 17 15 12      Perceived Dyspnea (Exercise) 3 3 2      Symptoms Bilateral hip pain 4/10 none none     Comments results first 2 weeks of exercise --     Duration Progress to 30 minutes of  aerobic without signs/symptoms of physical distress Progress to 30 minutes of  aerobic without signs/symptoms of physical distress Continue with 30 min of aerobic exercise without signs/symptoms of physical distress.     Intensity THRR  New THRR New THRR unchanged       Progression   Progression Continue to progress workloads to maintain intensity without signs/symptoms of physical distress. Continue to progress workloads to maintain intensity without signs/symptoms of physical distress. Continue to progress workloads to maintain intensity without signs/symptoms of physical distress.     Average METs 2.59 2.15 3.45       Resistance Training   Training Prescription -- Yes Yes     Weight -- 5 lb 5 lb     Reps -- 10-15 10-15       Interval Training   Interval Training -- No No       NuStep   Level -- -- 2     Minutes -- -- 15     METs -- -- 5.2       T5 Nustep   Level -- 2 2     Minutes -- 15 15     METs -- 2.1 2       Biostep-RELP   Level -- 2 --     Minutes -- 15 --     METs -- 2 --       Track   Laps -- 26 42     Minutes -- 15 15     METs -- 2.41 3.28       Oxygen   Maintain Oxygen Saturation -- 88% or higher 88% or higher              Exercise Comments:   Exercise Comments     Row Name 12/30/23 1406 01/21/24 1127         Exercise Comments First full day of exercise!  Patient was oriented to gym and equipment including functions, settings, policies, and procedures.  Patient's individual exercise prescription and treatment plan were reviewed.  All starting workloads were established based on the results of the 6 minute walk test done at initial orientation visit.  The plan for exercise progression was also introduced and progression will be customized based on patient's performance and goals. First full day of exercise!  Patient was oriented to gym and equipment including functions, settings, policies, and procedures.  Patient's individual exercise prescription and treatment plan were reviewed.  All starting workloads were established based on the results of the 6 minute walk test done at initial orientation visit.  The plan for exercise progression was also introduced and progression will be  customized based on patient's performance and goals.               Exercise Goals and Review:   Exercise  Goals     Row Name 12/26/23 1614             Exercise Goals   Increase Physical Activity Yes       Intervention Provide advice, education, support and counseling about physical activity/exercise needs.;Develop an individualized exercise prescription for aerobic and resistive training based on initial evaluation findings, risk stratification, comorbidities and participant's personal goals.       Expected Outcomes Short Term: Attend rehab on a regular basis to increase amount of physical activity.;Long Term: Add in home exercise to make exercise part of routine and to increase amount of physical activity.;Long Term: Exercising regularly at least 3-5 days a week.       Increase Strength and Stamina Yes       Intervention Provide advice, education, support and counseling about physical activity/exercise needs.;Develop an individualized exercise prescription for aerobic and resistive training based on initial evaluation findings, risk stratification, comorbidities and participant's personal goals.       Expected Outcomes Short Term: Increase workloads from initial exercise prescription for resistance, speed, and METs.;Short Term: Perform resistance training exercises routinely during rehab and add in resistance training at home;Long Term: Improve cardiorespiratory fitness, muscular endurance and strength as measured by increased METs and functional capacity ( )       Able to understand and use rate of perceived exertion (RPE) scale Yes       Intervention Provide education and explanation on how to use RPE scale       Expected Outcomes Short Term: Able to use RPE daily in rehab to express subjective intensity level;Long Term:  Able to use RPE to guide intensity level when exercising independently       Able to understand and use Dyspnea scale Yes       Intervention Provide education and  explanation on how to use Dyspnea scale       Expected Outcomes Short Term: Able to use Dyspnea scale daily in rehab to express subjective sense of shortness of breath during exertion;Long Term: Able to use Dyspnea scale to guide intensity level when exercising independently       Knowledge and understanding of Target Heart Rate Range (THRR) Yes       Intervention Provide education and explanation of THRR including how the numbers were predicted and where they are located for reference       Expected Outcomes Short Term: Able to state/look up THRR;Short Term: Able to use daily as guideline for intensity in rehab;Long Term: Able to use THRR to govern intensity when exercising independently       Able to check pulse independently Yes       Intervention Provide education and demonstration on how to check pulse in carotid and radial arteries.;Review the importance of being able to check your own pulse for safety during independent exercise       Expected Outcomes Short Term: Able to explain why pulse checking is important during independent exercise;Long Term: Able to check pulse independently and accurately       Understanding of Exercise Prescription Yes       Intervention Provide education, explanation, and written materials on patient's individual exercise prescription       Expected Outcomes Short Term: Able to explain program exercise prescription;Long Term: Able to explain home exercise prescription to exercise independently                Exercise Goals Re-Evaluation :  Exercise Goals Re-Evaluation     Row  Name 12/30/23 1407 01/16/24 1823 01/21/24 1127 01/29/24 0805       Exercise Goal Re-Evaluation   Exercise Goals Review Able to understand and use rate of perceived exertion (RPE) scale;Increase Physical Activity;Knowledge and understanding of Target Heart Rate Range (THRR);Understanding of Exercise Prescription;Increase Strength and Stamina;Able to check pulse independently Increase  Physical Activity;Increase Strength and Stamina;Understanding of Exercise Prescription Able to understand and use rate of perceived exertion (RPE) scale;Able to understand and use Dyspnea scale;Knowledge and understanding of Target Heart Rate Range (THRR);Understanding of Exercise Prescription Increase Physical Activity;Understanding of Exercise Prescription;Increase Strength and Stamina    Comments Reviewed RPE and dyspnea scale, THR and program prescription with pt today.  Pt voiced understanding and was given a copy of goals to take home. Beonca is off to a good start in the program. She was able to use all of her prescribed exercise machines/ modalities. She was also able to increase her laps on the track from 20 to 26 in 15 minutes. We will continue to monitor her progress in the program. Reviewed RPE and dyspnea scale, THR and program prescription with pt today.  Pt voiced understanding and was given a copy of goals to take home.       Short: Use RPE daily to regulate intensity.  Long: Follow program prescription in THR. Pascale is doing well in the program. She has increased her laps on the track to 42 laps from 26 laps. She has maintained level 2 on the T4 and T5 nusteps. We will continue to monitor her progress in the program.    Expected Outcomes Short: Use RPE daily to regulate intensity.  Long: Follow program prescription in THR. Short: Continue to follow exercise prescription. Long: Continue exercise to improve strength and stamina. Short: Use RPE daily to regulate intensity. Long: Follow program prescription in THR. Short: Try level 3 on the T4 and T5 nusteps. Long: Continue exercise to improve strength and stamina.             Discharge Exercise Prescription (Final Exercise Prescription Changes):  Exercise Prescription Changes - 01/29/24 0800       Response to Exercise   Blood Pressure (Admit) 114/56    Blood Pressure (Exercise) 142/66    Blood Pressure (Exit) 106/60    Heart Rate  (Admit) 72 bpm    Heart Rate (Exercise) 104 bpm    Heart Rate (Exit) 72 bpm    Oxygen Saturation (Admit) 91 %    Oxygen Saturation (Exercise) 85 %    Oxygen Saturation (Exit) 92 %    Rating of Perceived Exertion (Exercise) 12    Perceived Dyspnea (Exercise) 2    Symptoms none    Duration Continue with 30 min of aerobic exercise without signs/symptoms of physical distress.    Intensity THRR unchanged      Progression   Progression Continue to progress workloads to maintain intensity without signs/symptoms of physical distress.    Average METs 3.45      Resistance Training   Training Prescription Yes    Weight 5 lb    Reps 10-15      Interval Training   Interval Training No      NuStep   Level 2    Minutes 15    METs 5.2      T5 Nustep   Level 2    Minutes 15    METs 2      Track   Laps 42    Minutes 15  METs 3.28      Oxygen   Maintain Oxygen Saturation 88% or higher             Nutrition:  Target Goals: Understanding of nutrition guidelines, daily intake of sodium 1500mg , cholesterol 200mg , calories 30% from fat and 7% or less from saturated fats, daily to have 5 or more servings of fruits and vegetables.  Education: All About Nutrition: -Group instruction provided by verbal, written material, interactive activities, discussions, models, and posters to present general guidelines for heart healthy nutrition including fat, fiber, MyPlate, the role of sodium in heart healthy nutrition, utilization of the nutrition label, and utilization of this knowledge for meal planning. Follow up email sent as well. Written material given at graduation.   Biometrics:  Pre Biometrics - 12/26/23 1614       Pre Biometrics   Height 5' 5.7" (1.669 m)    Weight 177 lb 3.2 oz (80.4 kg)    Waist Circumference 45 inches    Hip Circumference 42 inches    Waist to Hip Ratio 1.07 %    BMI (Calculated) 28.85    Single Leg Stand 30 seconds              Nutrition  Therapy Plan and Nutrition Goals:  Nutrition Therapy & Goals - 12/26/23 1617       Nutrition Therapy   RD appointment deferred Yes      Intervention Plan   Intervention Prescribe, educate and counsel regarding individualized specific dietary modifications aiming towards targeted core components such as weight, hypertension, lipid management, diabetes, heart failure and other comorbidities.    Expected Outcomes Short Term Goal: Understand basic principles of dietary content, such as calories, fat, sodium, cholesterol and nutrients.             Nutrition Assessments:  MEDIFICTS Score Key: >=70 Need to make dietary changes  40-70 Heart Healthy Diet <= 40 Therapeutic Level Cholesterol Diet   Picture Your Plate Scores: <62 Unhealthy dietary pattern with much room for improvement. 41-50 Dietary pattern unlikely to meet recommendations for good health and room for improvement. 51-60 More healthful dietary pattern, with some room for improvement.  >60 Healthy dietary pattern, although there may be some specific behaviors that could be improved.   Nutrition Goals Re-Evaluation:   Nutrition Goals Discharge (Final Nutrition Goals Re-Evaluation):   Psychosocial: Target Goals: Acknowledge presence or absence of significant depression and/or stress, maximize coping skills, provide positive support system. Participant is able to verbalize types and ability to use techniques and skills needed for reducing stress and depression.   Education: Stress, Anxiety, and Depression - Group verbal and visual presentation to define topics covered.  Reviews how body is impacted by stress, anxiety, and depression.  Also discusses healthy ways to reduce stress and to treat/manage anxiety and depression.  Written material given at graduation.   Education: Sleep Hygiene -Provides group verbal and written instruction about how sleep can affect your health.  Define sleep hygiene, discuss sleep cycles and  impact of sleep habits. Review good sleep hygiene tips.    Initial Review & Psychosocial Screening:  Initial Psych Review & Screening - 12/23/23 1114       Initial Review   Current issues with Current Psychotropic Meds;Current Depression;History of Depression      Family Dynamics   Good Support System? Yes    Comments She can look to her son and daughters for support. She lives alone and states that she has anxiety and depression  due to bordome.      Barriers   Psychosocial barriers to participate in program The patient should benefit from training in stress management and relaxation.      Screening Interventions   Interventions Encouraged to exercise;Program counselor consult;To provide support and resources with identified psychosocial needs;Provide feedback about the scores to participant    Expected Outcomes Short Term goal: Utilizing psychosocial counselor, staff and physician to assist with identification of specific Stressors or current issues interfering with healing process. Setting desired goal for each stressor or current issue identified.;Long Term Goal: Stressors or current issues are controlled or eliminated.;Short Term goal: Identification and review with participant of any Quality of Life or Depression concerns found by scoring the questionnaire.;Long Term goal: The participant improves quality of Life and PHQ9 Scores as seen by post scores and/or verbalization of changes             Quality of Life Scores:  Scores of 19 and below usually indicate a poorer quality of life in these areas.  A difference of  2-3 points is a clinically meaningful difference.  A difference of 2-3 points in the total score of the Quality of Life Index has been associated with significant improvement in overall quality of life, self-image, physical symptoms, and general health in studies assessing change in quality of life.  PHQ-9: Review Flowsheet  More data exists      12/26/2023  02/07/2023 01/05/2022 09/11/2021 03/22/2021  Depression screen PHQ 2/9  Decreased Interest 1 0 0 0 0  Down, Depressed, Hopeless 1 1 0 0 0  PHQ - 2 Score 2 1 0 0 0  Altered sleeping 3 1 - 1 -  Tired, decreased energy 1 1 - 0 -  Change in appetite 0 0 - 0 -  Feeling bad or failure about yourself  2 0 - 0 -  Trouble concentrating 0 1 - 0 -  Moving slowly or fidgety/restless 1 0 - 0 -  Suicidal thoughts 0 0 - 0 -  PHQ-9 Score 9 4 - 1 -  Difficult doing work/chores Somewhat difficult Not difficult at all - Somewhat difficult -   Interpretation of Total Score  Total Score Depression Severity:  1-4 = Minimal depression, 5-9 = Mild depression, 10-14 = Moderate depression, 15-19 = Moderately severe depression, 20-27 = Severe depression   Psychosocial Evaluation and Intervention:  Psychosocial Evaluation - 12/23/23 1116       Psychosocial Evaluation & Interventions   Interventions Encouraged to exercise with the program and follow exercise prescription;Relaxation education    Comments She can look to her son and daughters for support. 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    Expected Outcomes Short: Attend [facility] regularly to improve shortness of breath with ADL's. Long: maintain independence with ADL's    Continue Psychosocial Services  Follow up required by staff             Psychosocial Re-Evaluation:   Psychosocial Discharge (Final Psychosocial Re-Evaluation):   Education: Education Goals: Education classes will be provided on a weekly basis, covering required topics. Participant will state understanding/return demonstration of topics presented.  Learning Barriers/Preferences:  Learning Barriers/Preferences - 12/23/23 1113       Learning Barriers/Preferences   Learning Barriers None    Learning Preferences None             General Pulmonary Education Topics:  Infection Prevention: - Provides verbal and written material to  individual  with discussion of infection control including proper hand washing and proper equipment cleaning during exercise session. Flowsheet Row Pulmonary Rehab from 12/26/2023 in [facility] Cardiac and Pulmonary Rehab  Date 12/26/23  Educator MB  Instruction Review Code 1- Verbalizes Understanding       Falls Prevention: - Provides verbal and written material to individual with discussion of falls prevention and safety. Flowsheet Row Pulmonary Rehab from 12/26/2023 in [facility] Cardiac and Pulmonary Rehab  Date 12/26/23  Educator MB  Instruction Review Code 1- Verbalizes Understanding       Chronic Lung Disease Review: - Group verbal instruction with posters, models, PowerPoint presentations and videos,  to review new updates, new respiratory medications, new advancements in procedures and treatments. Providing information on websites and "800" numbers for continued self-education. Includes information about supplement oxygen, available portable oxygen systems, continuous and intermittent flow rates, oxygen safety, concentrators, and Medicare reimbursement for oxygen. Explanation of Pulmonary Drugs, including class, frequency, complications, importance of spacers, rinsing mouth after steroid MDI's, and proper cleaning methods for nebulizers. Review of basic lung anatomy and physiology related to function, structure, and complications of lung disease. Review of risk factors. Discussion about methods for diagnosing sleep apnea and types of masks and machines for OSA. Includes a review of the use of types of environmental controls: home humidity, furnaces, filters, dust mite/pet prevention, HEPA vacuums. Discussion about weather changes, air quality and the benefits of nasal washing. Instruction on Warning signs, infection symptoms, calling MD promptly, preventive modes, and value of vaccinations. Review of effective airway clearance, coughing and/or vibration techniques. Emphasizing that all should Create an  Action Plan. Written material given at graduation.   AED/CPR: - Group verbal and written instruction with the use of models to demonstrate the basic use of the AED with the basic ABC's of resuscitation.    Anatomy and Cardiac Procedures: - Group verbal and visual presentation and models provide information about basic cardiac anatomy and function. Reviews the testing methods done to diagnose heart disease and the outcomes of the test results. Describes the treatment choices: Medical Management, Angioplasty, or Coronary Bypass Surgery for treating various heart conditions including Myocardial Infarction, Angina, Valve Disease, and Cardiac Arrhythmias.  Written material given at graduation.   Medication Safety: - Group verbal and visual instruction to review commonly prescribed medications for heart and lung disease. Reviews the medication, class of the drug, and side effects. Includes the steps to properly store meds and maintain the prescription regimen.  Written material given at graduation.   Other: -Provides group and verbal instruction on various topics (see comments)   Knowledge Questionnaire Score:    Core Components/Risk Factors/Patient Goals at Admission:  Personal Goals and Risk Factors at Admission - 12/26/23 1617       Core Components/Risk Factors/Patient Goals on Admission    Weight Management Yes;Weight Loss    Intervention Weight Management: Develop a combined nutrition and exercise program designed to reach desired caloric intake, while maintaining appropriate intake of nutrient and fiber, sodium and fats, and appropriate energy expenditure required for the weight goal.;Weight Management: Provide education and appropriate resources to help participant work on and attain dietary goals.;Weight Management/Obesity: Establish reasonable short term and long term weight goals.    Admit Weight 177 lb 3.2 oz (80.4 kg)    Goal Weight: Short Term 160 lb (72.6 kg)    Goal Weight:  Long Term 150 lb (68 kg)    Expected Outcomes Short Term: Continue to assess and modify interventions until short term  weight is achieved;Weight Maintenance: Understanding of the daily nutrition guidelines, which includes 25-35% calories from fat, 7% or less cal from saturated fats, less than 200mg  cholesterol, less than 1.5gm of sodium, & 5 or more servings of fruits and vegetables daily;Weight Loss: Understanding of general recommendations for a balanced deficit meal plan, which promotes 1-2 lb weight loss per week and includes a negative energy balance of [phone] kcal/d;Understanding recommendations for meals to include 15-35% energy as protein, 25-35% energy from fat, 35-60% energy from carbohydrates, less than 200mg  of dietary cholesterol, 20-35 gm of total fiber daily;Understanding of distribution of calorie intake throughout the day with the consumption of 4-5 meals/snacks    Improve shortness of breath with ADL's Yes    Intervention Provide education, individualized exercise plan and daily activity instruction to help decrease symptoms of SOB with activities of daily living.    Expected Outcomes Short Term: Improve cardiorespiratory fitness to achieve a reduction of symptoms when performing ADLs;Long Term: Be able to perform more ADLs without symptoms or delay the onset of symptoms    Hypertension Yes    Intervention Provide education on lifestyle modifcations including regular physical activity/exercise, weight management, moderate sodium restriction and increased consumption of fresh fruit, vegetables, and low fat dairy, alcohol moderation, and smoking cessation.;Monitor prescription use compliance.    Expected Outcomes Short Term: Continued assessment and intervention until BP is < 140/5mm HG in hypertensive participants. < 130/69mm HG in hypertensive participants with diabetes, heart failure or chronic kidney disease.;Long Term: Maintenance of blood pressure at goal levels.    Lipids Yes     Intervention Provide education and support for participant on nutrition & aerobic/resistive exercise along with prescribed medications to achieve LDL 70mg , HDL >40mg .    Expected Outcomes Short Term: Participant states understanding of desired cholesterol values and is compliant with medications prescribed. Participant is following exercise prescription and nutrition guidelines.;Long Term: Cholesterol controlled with medications as prescribed, with individualized exercise RX and with personalized nutrition plan. Value goals: LDL < 70mg , HDL > 40 mg.             Education:Diabetes - Individual verbal and written instruction to review signs/symptoms of diabetes, desired ranges of glucose level fasting, after meals and with exercise. Acknowledge that pre and post exercise glucose checks will be done for 3 sessions at entry of program.   Know Your Numbers and Heart Failure: - Group verbal and visual instruction to discuss disease risk factors for cardiac and pulmonary disease and treatment options.  Reviews associated critical values for Overweight/Obesity, Hypertension, Cholesterol, and Diabetes.  Discusses basics of heart failure: signs/symptoms and treatments.  Introduces Heart Failure Zone chart for action plan for heart failure.  Written material given at graduation.   Core Components/Risk Factors/Patient Goals Review:    Core Components/Risk Factors/Patient Goals at Discharge (Final Review):    ITP Comments:  ITP Comments     Row Name 12/23/23 1112 12/26/23 1606 12/30/23 1405 01/08/24 0818 01/21/24 1127   ITP Comments Virtual Visit completed. Patient informed on EP and RD appointment and 6 Minute walk test. Patient also informed of patient health questionnaires on My Chart. Patient Verbalizes understanding. Visit diagnosis can be found in [facility] 10/18/2023. Completed and gym orientation. Initial ITP created and sent for review to Dr. Jinny Sanders, Medical Director. First full day of  exercise!  Patient was oriented to gym and equipment including functions, settings, policies, and procedures.  Patient's individual exercise prescription and treatment plan were reviewed.  All starting  workloads were established based on the results of the 6 minute walk test done at initial orientation visit.  The plan for exercise progression was also introduced and progression will be customized based on patient's performance and goals. 30 Day review completed. Medical Director ITP review done, changes made as directed, and signed approval by Medical Director. New to program. wrong chart  not her first day  SB 01/21/2024    Row Name 02/05/24 0940           ITP Comments 30 Day review completed. Medical Director ITP review done, changes made as directed, and signed approval by Medical Director.                Comments:

## 2024-02-06 ENCOUNTER — Encounter: Admitting: *Deleted

## 2024-02-06 DIAGNOSIS — J439 Emphysema, unspecified: Secondary | ICD-10-CM

## 2024-02-06 NOTE — Progress Notes (Signed)
 Daily Session Note  Patient Details  Name: Kristin Parker MRN: [id number] Date of Birth: July 05, 1951 Referring Provider:   Flowsheet Row Pulmonary Rehab from 12/26/2023 in [facility] Cardiac and Pulmonary Rehab  Referring Provider Raechel Chute, MD       Encounter Date: 02/06/2024  Check In:  Session Check In - 02/06/24 1129       Check-In   Supervising physician immediately available to respond to emergencies See telemetry face sheet for immediately available ER MD    Location [facility]-Cardiac & Pulmonary Rehab    Staff Present Cora Collum, RN, BSN, CCRP;Meredith Jewel Baize RN,BSN;Joseph Hood RCP,RRT,BSRT;Maxon Hersey BS, Exercise Physiologist;Noah Tickle, BS, Exercise Physiologist    Virtual Visit No    Medication changes reported     No    Fall or balance concerns reported    No    Warm-up and Cool-down Performed on first and last piece of equipment    Resistance Training Performed Yes    VAD Patient? No    PAD/SET Patient? No      Pain Assessment   Currently in Pain? No/denies                Social History   Tobacco Use  Smoking Status Former   Current packs/day: 0.00   Average packs/day: 0.5 packs/day for 30.0 years (15.0 ttl pk-yrs)   Types: Cigarettes   Start date: 10/30/1990   Quit date: 10/30/2020   Years since quitting: 3.2  Smokeless Tobacco Never  Tobacco Comments   Quit Jan 2022    Goals Met:  Proper associated with RPD/PD & O2 Sat Independence with exercise equipment Exercise tolerated well No report of concerns or symptoms today  Goals Unmet:  Not Applicable  Comments: Pt able to follow exercise prescription today without complaint.  Will continue to monitor for progression.    Dr. Bethann Punches is Medical Director for [facility] Cardiac Rehabilitation.  Dr. Vida Rigger is Medical Director for [facility] Pulmonary Rehabilitation.

## 2024-02-11 ENCOUNTER — Encounter: Admitting: *Deleted

## 2024-02-11 DIAGNOSIS — J439 Emphysema, unspecified: Secondary | ICD-10-CM

## 2024-02-11 NOTE — Progress Notes (Signed)
 Daily Session Note  Patient Details  Name: RAINY ROTHMAN MRN: [id number] Date of Birth: May 12, 1951 Referring Provider:   Flowsheet Row Pulmonary Rehab from 12/26/2023 in [facility] Cardiac and Pulmonary Rehab  Referring Provider Vergia Glasgow, MD       Encounter Date: 02/11/2024  Check In:  Session Check In - 02/11/24 1129       Check-In   Supervising physician immediately available to respond to emergencies See telemetry face sheet for immediately available ER MD    Location [facility]-Cardiac & Pulmonary Rehab    Staff Present Lyell Samuel, MS, Exercise Physiologist;Maxon Conetta BS, Exercise Physiologist;Noah Tickle, BS, Exercise Physiologist;Paxtyn Wisdom, RN, BSN, CCRP    Virtual Visit No    Medication changes reported     No    Fall or balance concerns reported    No    Warm-up and Cool-down Performed on first and last piece of equipment    Resistance Training Performed Yes    VAD Patient? No    PAD/SET Patient? No      Pain Assessment   Currently in Pain? No/denies                Social History   Tobacco Use  Smoking Status Former   Current packs/day: 0.00   Average packs/day: 0.5 packs/day for 30.0 years (15.0 ttl pk-yrs)   Types: Cigarettes   Start date: 10/30/1990   Quit date: 10/30/2020   Years since quitting: 3.2  Smokeless Tobacco Never  Tobacco Comments   Quit Jan 2022    Goals Met:  Proper associated with RPD/PD & O2 Sat Independence with exercise equipment Exercise tolerated well No report of concerns or symptoms today  Goals Unmet:  Not Applicable  Comments: Pt able to follow exercise prescription today without complaint.  Will continue to monitor for progression.    Dr. Firman Hughes is Medical Director for [facility] Cardiac Rehabilitation.  Dr. Fuad Aleskerov is Medical Director for [facility] Pulmonary Rehabilitation.

## 2024-02-13 ENCOUNTER — Telehealth: Payer: Self-pay

## 2024-02-13 ENCOUNTER — Other Ambulatory Visit: Payer: Self-pay | Admitting: Family Medicine

## 2024-02-13 ENCOUNTER — Encounter: Admitting: *Deleted

## 2024-02-13 DIAGNOSIS — J439 Emphysema, unspecified: Secondary | ICD-10-CM | POA: Diagnosis not present

## 2024-02-13 DIAGNOSIS — G43909 Migraine, unspecified, not intractable, without status migrainosus: Secondary | ICD-10-CM

## 2024-02-13 DIAGNOSIS — F0781 Postconcussional syndrome: Secondary | ICD-10-CM

## 2024-02-13 NOTE — Progress Notes (Signed)
 Daily Session Note  Patient Details  Name: NIRALYA OHANIAN MRN: [id number] Date of Birth: 31-Aug-1951 Referring Provider:   Flowsheet Row Pulmonary Rehab from 12/26/2023 in [facility] Cardiac and Pulmonary Rehab  Referring Provider Vergia Glasgow, MD       Encounter Date: 02/13/2024  Check In:  Session Check In - 02/13/24 1123       Check-In   Supervising physician immediately available to respond to emergencies See telemetry face sheet for immediately available ER MD    Location [facility]-Cardiac & Pulmonary Rehab    Staff Present Maud Sorenson, RN, BSN, CCRP;Meredith Craven RN,BSN;Noah Tickle, BS, Exercise Physiologist;Maxon Conetta BS, Exercise Physiologist;Jason Martina Sledge RDN,LDN    Virtual Visit No    Medication changes reported     No    Fall or balance concerns reported    No    Warm-up and Cool-down Performed on first and last piece of equipment    Resistance Training Performed No    VAD Patient? No    PAD/SET Patient? No      Pain Assessment   Currently in Pain? No/denies                Social History   Tobacco Use  Smoking Status Former   Current packs/day: 0.00   Average packs/day: 0.5 packs/day for 30.0 years (15.0 ttl pk-yrs)   Types: Cigarettes   Start date: 10/30/1990   Quit date: 10/30/2020   Years since quitting: 3.2  Smokeless Tobacco Never  Tobacco Comments   Quit Jan 2022    Goals Met:  Proper associated with RPD/PD & O2 Sat Independence with exercise equipment Exercise tolerated well No report of concerns or symptoms today  Goals Unmet:  Not Applicable  Comments: Pt able to follow exercise prescription today without complaint.  Will continue to monitor for progression.    Dr. Firman Hughes is Medical Director for [facility] Cardiac Rehabilitation.  Dr. Fuad Aleskerov is Medical Director for [facility] Pulmonary Rehabilitation.

## 2024-02-13 NOTE — Progress Notes (Signed)
 Subjective:   Kristin Parker is a 73 y.o. who presents for a Medicare Wellness preventive visit.  Visit Complete:     Persons Participating in Visit:   AWV Questionnaire:         Objective:    There were no vitals filed for this visit. There is no height or weight on file to calculate BMI.     12/23/2023   11:09 AM 03/01/2023   10:30 AM 02/07/2023    3:09 PM 07/03/2022   10:03 AM 01/05/2022   10:31 AM 05/15/2021    9:41 AM 02/18/2021    1:46 AM  Advanced Directives  Does Patient Have a Medical Advance Directive? No No No No No No   Would patient like information on creating a medical advance directive? No - Patient declined No - Patient declined No - Patient declined  No - Patient declined  No - Patient declined    Current Medications (verified) Outpatient Encounter Medications as of 02/13/2024  Medication Sig   REPATHA  SURECLICK 140 MG/ML SOAJ INJECT 1 DOSE INTO THE SKIN EVERY 14 DAYS   acetaminophen  (TYLENOL ) 500 MG tablet Take 2 tablets (1,000 mg total) by mouth every 6 (six) hours as needed for mild pain.   acetic acid -hydrocortisone  (VOSOL -HC) OTIC solution Place 3 drops into both ears 3 (three) times daily. May use wick for first 24 hours (Patient not taking: Reported on 12/23/2023)   albuterol  (VENTOLIN  HFA) 108 (90 Base) MCG/ACT inhaler INHALE 2 PUFFS EVERY 4 HOURS FOR WHEEZING OR SHORTNESS OF BREATH AS NEEDED   amLODipine  (NORVASC ) 10 MG tablet TAKE 1 TABLET BY MOUTH ONCE DAILY   aspirin  EC 81 MG tablet Take 1 tablet (81 mg total) by mouth daily.   busPIRone  (BUSPAR ) 5 MG tablet Take 1 tablet (5 mg total) by mouth 2 (two) times daily.   estradiol  (ESTRACE ) 0.1 MG/GM vaginal cream Apply 1 gram per vagina every night for 2 weeks, then apply three times a week   famotidine  (PEPCID ) 20 MG tablet TAKE 1 TABLET BY MOUTH TWICE DAILY   fluticasone  (FLONASE ) 50 MCG/ACT nasal spray USE 2 SPRAYS INTO EACH NOSTRIL ONCE DAILY   Fluticasone -Umeclidin-Vilant (TRELEGY ELLIPTA )  200-62.5-25 MCG/ACT AEPB Inhale 1 Inhalation into the lungs daily.   hydrOXYzine  (VISTARIL ) 25 MG capsule TAKE 1 CAPSULE BY MOUTH EVERY 8 HOURS ASNEEDED   icosapent  Ethyl (VASCEPA ) 1 g capsule Take 2 capsules (2 g total) by mouth 2 (two) times daily. (Patient not taking: Reported on 12/23/2023)   isosorbide  mononitrate (IMDUR ) 60 MG 24 hr tablet TAKE 1 TABLET BY MOUTH ONCE DAILY   losartan  (COZAAR ) 100 MG tablet Take 1 tablet (100 mg total) by mouth daily.   meloxicam  (MOBIC ) 15 MG tablet TAKE 1 TABLET BY MOUTH ONCE DAILY AS NEEDED FOR PAIN WITH FOOD   metoprolol  tartrate (LOPRESSOR ) 100 MG tablet Take 1 tablet (100 mg total) by mouth once for 1 dose. 2 hours prior to CT   rizatriptan  (MAXALT -MLT) 10 MG disintegrating tablet Take 1 tablet (10 mg total) by mouth as needed for migraine. May repeat in 2 hours if needed (Patient not taking: Reported on 12/23/2023)   sertraline  (ZOLOFT ) 100 MG tablet TAKE 1 TABLET BY MOUTH ONCE DAILY   WEGOVY  0.5 MG/0.5ML SOAJ Inject 0.5 mg into the skin once a week.   zolpidem  (AMBIEN  CR) 6.25 MG CR tablet Take 1 tablet (6.25 mg total) by mouth at bedtime as needed. for sleep   No facility-administered encounter medications on file as  of 02/13/2024.    Allergies (verified) Bactrim  [sulfamethoxazole -trimethoprim ], Aleve [naproxen sodium], Atorvastatin, Crestor  [rosuvastatin ], Simvastatin , and Zetia  [ezetimibe ]   History: Past Medical History:  Diagnosis Date   Anxiety    Aortic valve sclerosis    Bilateral carotid bruits    Carotid artery stenosis    COPD (chronic obstructive pulmonary disease) (HCC)    Hypertension    Mitral regurgitation    Mixed hyperlipidemia    Shoulder pain    Sleeping difficulties    Past Surgical History:  Procedure Laterality Date   BREAST BIOPSY Left 08/13/2018   affirm bx, x clip, path pending   CARDIAC CATHETERIZATION     CATARACT EXTRACTION Left    2017   CHOLECYSTECTOMY     COLONOSCOPY  2012   Dr Janine Melbourne   COLONOSCOPY WITH  PROPOFOL  N/A 05/15/2021   Procedure: COLONOSCOPY WITH PROPOFOL ;  Surgeon: Luke Salaam, MD;  Location: [facility] ENDOSCOPY;  Service: Gastroenterology;  Laterality: N/A;   COLONOSCOPY WITH PROPOFOL  N/A 07/03/2022   Procedure: COLONOSCOPY WITH PROPOFOL ;  Surgeon: Luke Salaam, MD;  Location: [facility] ENDOSCOPY;  Service: Gastroenterology;  Laterality: N/A;   EYE SURGERY     LEFT HEART CATH AND CORONARY ANGIOGRAPHY N/A 06/25/2017   Procedure: LEFT HEART CATH AND CORONARY ANGIOGRAPHY;  Surgeon: Sammy Crisp, MD;  Location: [facility] INVASIVE CV LAB;  Service: Cardiovascular;  Laterality: N/A;   Family History  Problem Relation Age of Onset   Cancer Mother        bladder cancer   Heart disease Mother 27       Pacemaker   Cancer Father        lung   Multiple sclerosis Sister    Valvular heart disease Brother 49       s/p bioprosthetic valve replacement at [facility]   Breast cancer Neg Hx    Social History   Socioeconomic History   Marital status: Divorced    Spouse name: Not on file   Number of children: Not on file   Years of education: Not on file   Highest education level: GED or equivalent  Occupational History   Occupation: retired   Tobacco Use   Smoking status: Former    Current packs/day: 0.00    Average packs/day: 0.5 packs/day for 30.0 years (15.0 ttl pk-yrs)    Types: Cigarettes    Start date: 10/30/1990    Quit date: 10/30/2020    Years since quitting: 3.2   Smokeless tobacco: Never   Tobacco comments:    Quit Jan 2022  Vaping Use   Vaping status: Former  Substance and Sexual Activity   Alcohol use: No    Alcohol/week: 0.0 standard drinks of alcohol   Drug use: No   Sexual activity: Not on file  Other Topics Concern   Not on file  Social History Narrative   Not on file   Social Drivers of Health   Financial Resource Strain: Low Risk  (02/07/2023)   Overall Financial Resource Strain (CARDIA)    Difficulty of Paying Living Expenses: Not hard at all  Food Insecurity: No Food  Insecurity (02/07/2023)   Hunger Vital Sign    Worried About Running Out of Food in the Last Year: Never true    Ran Out of Food in the Last Year: Never true  Transportation Needs: No Transportation Needs (02/07/2023)   PRAPARE - Administrator, Civil Service (Medical): No    Lack of Transportation (Non-Medical): No  Physical Activity: Insufficiently Active (02/07/2023)  Exercise Vital Sign    Days of Exercise per Week: 3 days    Minutes of Exercise per Session: 30 min  Stress: No Stress Concern Present (02/07/2023)   [employer] of Occupational Health - Occupational Stress Questionnaire    Feeling of Stress : Not at all  Social Connections: Socially Isolated (02/07/2023)   Social Connection and Isolation Panel [NHANES]    Frequency of Communication with Friends and Family: More than three times a week    Frequency of Social Gatherings with Friends and Family: More than three times a week    Attends Religious Services: Never    Database administrator or Organizations: No    Attends Banker Meetings: Never    Marital Status: Divorced    Tobacco Counseling Counseling given: Not Answered Tobacco comments: Quit Jan 2022    Clinical Intake:              Lab Results  Component Value Date   HGBA1C 5.8 (H) 09/11/2023   HGBA1C 5.6 09/06/2021   HGBA1C 5.7 (H) 03/01/2021               Activities of Daily Living      No data to display          Patient Care Team: Raina Bunting, DO as PCP - General (Family Medicine) End, Veryl Gottron, MD as PCP - Cardiology (Cardiology) Jerlean Mood, MD (General Surgery) Vergia Glasgow, MD as Consulting Physician (Pulmonary Disease)  Indicate any recent Medical Services you may have received from other than [facility] providers in the past year (date may be approximate).     Assessment:   This is a routine wellness examination for Kristin Parker.  Hearing/Vision screen No results  found.   Goals Addressed   None    Depression Screen     12/26/2023    4:18 PM 02/07/2023    3:04 PM 01/05/2022   10:29 AM 09/11/2021    2:19 PM 03/22/2021    9:34 AM 03/08/2021    1:38 PM 09/08/2020   11:49 AM  PHQ 2/9 Scores  PHQ - 2 Score 2 1 0 0 0 0 1  PHQ- 9 Score 9 4  1  1 4     Fall Risk     12/23/2023   11:04 AM 02/07/2023    3:10 PM 01/05/2022   10:32 AM 09/11/2021    2:19 PM 03/08/2021    1:37 PM  Fall Risk   Falls in the past year? 1 0 0 0 0  Number falls in past yr: 1 0 0 0 0  Injury with Fall? 1 0 0 0 0  Risk for fall due to : History of fall(s) No Fall Risks No Fall Risks    Follow up Falls evaluation completed;Education provided;Falls prevention discussed Falls prevention discussed;Falls evaluation completed Falls evaluation completed Falls evaluation completed Falls evaluation completed    MEDICARE RISK AT HOME:     TIMED UP AND GO:  Was the test performed?    Cognitive Function:         02/07/2023    3:16 PM 06/24/2018    9:51 AM 05/14/2017    3:49 PM  6CIT Screen  What Year? 0 points 0 points 0 points  What month? 0 points 0 points 0 points  What time? 0 points 0 points 0 points  Count back from 20 0 points 0 points 0 points  Months in reverse 0 points 0 points 0 points  Repeat phrase 0 points 0 points 0 points  Total Score 0 points 0 points 0 points    Immunizations  There is no immunization history on file for this patient.  Screening Tests Health Maintenance  Topic Date Due   COVID-19 Vaccine (1) Never done   DTaP/Tdap/Td (1 - Tdap) Never done   Pneumonia Vaccine 70+ Years old (1 of 2 - PCV) Never done   Zoster Vaccines- Shingrix (1 of 2) Never done   DEXA SCAN  Never done   MAMMOGRAM  08/06/2020   Medicare Annual Wellness (AWV)  02/07/2024   INFLUENZA VACCINE  05/29/2024   Colonoscopy  07/03/2025   Hepatitis C Screening  Completed   HPV VACCINES  Aged Out   Meningococcal B Vaccine  Aged Out    Health Maintenance  Health  Maintenance Due  Topic Date Due   COVID-19 Vaccine (1) Never done   DTaP/Tdap/Td (1 - Tdap) Never done   Pneumonia Vaccine 70+ Years old (1 of 2 - PCV) Never done   Zoster Vaccines- Shingrix (1 of 2) Never done   DEXA SCAN  Never done   MAMMOGRAM  08/06/2020   Medicare Annual Wellness (AWV)  02/07/2024   Health Maintenance Items Addressed:   Additional Screening:  Vision Screening: Recommended annual ophthalmology exams for early detection of glaucoma and other disorders of the eye.  Dental Screening: Recommended annual dental exams for proper oral hygiene  Community Resource Referral / Chronic Care Management: CRR required this visit?    CCM required this visit?       Plan:     I have personally reviewed and noted the following in the patient's chart:   Medical and social history Use of alcohol, tobacco or illicit drugs  Current medications and supplements including opioid prescriptions.  Functional ability and status Nutritional status Physical activity Advanced directives List of other physicians Hospitalizations, surgeries, and ER visits in previous 12 months Vitals Screenings to include cognitive, depression, and falls Referrals and appointments  In addition, I have reviewed and discussed with patient certain preventive protocols, quality metrics, and best practice recommendations. A written personalized care plan for preventive services as well as general preventive health recommendations were provided to patient.     Dewayne Ford, LPN   07/12/7828   After Visit Summary:   Notes: This encounter was created in error - please disregard.Patient was a no show.

## 2024-02-13 NOTE — Telephone Encounter (Signed)
Unsuccessful attempts to reach patient on preferred number listed in notes for scheduled AWV. Left message on voicemail ok to reschedule.

## 2024-02-14 NOTE — Telephone Encounter (Signed)
 Requested Prescriptions  Pending Prescriptions Disp Refills   rizatriptan  (MAXALT -MLT) 10 MG disintegrating tablet [Pharmacy Med Name: RIZATRIPTAN  BENZOATE 10 MG ODT] 10 tablet 2    Sig: DISSOLVE 1 TABLET ON THE TONGUE AS NEEDED FOR MIGRAINE. MAY REPEAT IN 2 HOURS IF NEEDED     Neurology:  Migraine Therapy - Triptan Passed - 02/14/2024 11:32 AM      Passed - Last BP in normal range    BP Readings from Last 1 Encounters:  12/17/23 130/68         Passed - Valid encounter within last 12 months    Recent Outpatient Visits           1 month ago Atrophic vaginitis   [facility], [place], [place]

## 2024-02-17 NOTE — Progress Notes (Signed)
 Hi, This patient was a no show, noted in chart.

## 2024-02-18 ENCOUNTER — Encounter

## 2024-02-18 ENCOUNTER — Telehealth: Payer: Self-pay

## 2024-02-18 NOTE — Telephone Encounter (Signed)
 We attempted to call Pt today as she did not attend rehab for her scheduled appt. There was no answer and we were unable to leave a voicemail at this number. We will expect to see her back in rehab on 02/20/2024.

## 2024-02-20 ENCOUNTER — Encounter

## 2024-02-25 ENCOUNTER — Telehealth: Payer: Self-pay

## 2024-02-25 ENCOUNTER — Encounter

## 2024-02-25 NOTE — Telephone Encounter (Signed)
 We attempted to call Pt today as she did not attend rehab for her scheduled appt. There was no answer. We left a message requesting that she call us  back to update us  on her current status. She is scheduled to attend rehab again on 02/27/2024.

## 2024-02-27 ENCOUNTER — Encounter: Attending: Student in an Organized Health Care Education/Training Program

## 2024-02-27 DIAGNOSIS — J439 Emphysema, unspecified: Secondary | ICD-10-CM | POA: Insufficient documentation

## 2024-03-03 ENCOUNTER — Encounter

## 2024-03-03 ENCOUNTER — Telehealth: Payer: Self-pay

## 2024-03-03 NOTE — Telephone Encounter (Signed)
 We attempted to call Kristin Parker today as she has not attended rehab since 02/13/2024. We left a message as there was no answer. She has not returned our previous calls. We will send her a discharge letter at this time.

## 2024-03-04 DIAGNOSIS — J439 Emphysema, unspecified: Secondary | ICD-10-CM

## 2024-03-04 NOTE — Progress Notes (Signed)
 30 Day review completed. Medical Director ITP review done, changes made as directed, and signed approval by Medical Director. ? ?

## 2024-03-04 NOTE — Progress Notes (Signed)
 Pulmonary Individual Treatment Plan  Patient Details  Name: Kristin Parker MRN: [id number] Date of Birth: [birth year] Referring Provider:   Flowsheet Row Pulmonary Rehab from 12/26/2023 in [facility]  Referring Provider Vergia Glasgow, MD       Initial Encounter Date:  Flowsheet Row Pulmonary Rehab from 12/26/2023 in [facility] and Pulmonary Rehab  Date 12/26/23       Visit Diagnosis: Pulmonary emphysema, unspecified emphysema type (HCC)  Patient's Home Medications on Admission:  Current Outpatient Medications:    REPATHA  SURECLICK 140 MG/ML SOAJ, INJECT 1 DOSE INTO THE SKIN EVERY 14 DAYS, Disp: 6 mL, Rfl: 0   acetaminophen  (TYLENOL ) 500 MG tablet, Take 2 tablets (1,000 mg total) by mouth every 6 (six) hours as needed for mild pain., Disp: , Rfl:    acetic acid -hydrocortisone  (VOSOL -HC) OTIC solution, Place 3 drops into both ears 3 (three) times daily. May use wick for first 24 hours (Patient not taking: Reported on 12/23/2023), Disp: 10 mL, Rfl: 0   albuterol  (VENTOLIN  HFA) 108 (90 Base) MCG/ACT inhaler, INHALE 2 PUFFS EVERY 4 HOURS FOR WHEEZING OR SHORTNESS OF BREATH AS NEEDED, Disp: 18 g, Rfl: 3   amLODipine  (NORVASC ) 10 MG tablet, TAKE 1 TABLET BY MOUTH ONCE DAILY, Disp: 90 tablet, Rfl: 1   aspirin  EC 81 MG tablet, Take 1 tablet (81 mg total) by mouth daily., Disp: , Rfl:    busPIRone  (BUSPAR ) 5 MG tablet, Take 1 tablet (5 mg total) by mouth 2 (two) times daily., Disp: 180 tablet, Rfl: 3   estradiol  (ESTRACE ) 0.1 MG/GM vaginal cream, Apply 1 gram per vagina every night for 2 weeks, then apply three times a week, Disp: 42.5 g, Rfl: 5   famotidine  (PEPCID ) 20 MG tablet, TAKE 1 TABLET BY MOUTH TWICE DAILY, Disp: 180 tablet, Rfl: 3   fluticasone  (FLONASE ) 50 MCG/ACT nasal spray, USE 2 SPRAYS INTO EACH NOSTRIL ONCE DAILY, Disp: 48 g, Rfl: 0   Fluticasone -Umeclidin-Vilant (TRELEGY ELLIPTA ) 200-62.5-25 MCG/ACT AEPB, Inhale 1 Inhalation into the lungs daily., Disp: 30  each, Rfl: 12   hydrOXYzine  (VISTARIL ) 25 MG capsule, TAKE 1 CAPSULE BY MOUTH EVERY 8 HOURS ASNEEDED, Disp: 270 capsule, Rfl: 0   icosapent  Ethyl (VASCEPA ) 1 g capsule, Take 2 capsules (2 g total) by mouth 2 (two) times daily. (Patient not taking: Reported on 12/23/2023), Disp: 360 capsule, Rfl: 3   isosorbide  mononitrate (IMDUR ) 60 MG 24 hr tablet, TAKE 1 TABLET BY MOUTH ONCE DAILY, Disp: 30 tablet, Rfl: 11   losartan  (COZAAR ) 100 MG tablet, Take 1 tablet (100 mg total) by mouth daily., Disp: 90 tablet, Rfl: 1   meloxicam  (MOBIC ) 15 MG tablet, TAKE 1 TABLET BY MOUTH ONCE DAILY AS NEEDED FOR PAIN WITH FOOD, Disp: 30 tablet, Rfl: 5   metoprolol  tartrate (LOPRESSOR ) 100 MG tablet, Take 1 tablet (100 mg total) by mouth once for 1 dose. 2 hours prior to CT, Disp: 1 tablet, Rfl: 0   rizatriptan  (MAXALT -MLT) 10 MG disintegrating tablet, DISSOLVE 1 TABLET ON THE TONGUE AS NEEDED FOR MIGRAINE. MAY REPEAT IN 2 HOURS IF NEEDED, Disp: 10 tablet, Rfl: 2   sertraline  (ZOLOFT ) 100 MG tablet, TAKE 1 TABLET BY MOUTH ONCE DAILY, Disp: 90 tablet, Rfl: 1   WEGOVY  0.5 MG/0.5ML SOAJ, Inject 0.5 mg into the skin once a week., Disp: 2 mL, Rfl: 2   zolpidem  (AMBIEN  CR) 6.25 MG CR tablet, Take 1 tablet (6.25 mg total) by mouth at bedtime as needed. for sleep, Disp: 30  tablet, Rfl: 5  Past Medical History: Past Medical History:  Diagnosis Date   Anxiety    Aortic valve sclerosis    Bilateral carotid bruits    Carotid artery stenosis    COPD (chronic obstructive pulmonary disease) (HCC)    Hypertension    Mitral regurgitation    Mixed hyperlipidemia    Shoulder pain    Sleeping difficulties     Tobacco Use: Social History   Tobacco Use  Smoking Status Former   Current packs/day: 0.00   Average packs/day: 0.5 packs/day for 30.0 years (15.0 ttl pk-yrs)   Types: Cigarettes   Start date: 10/30/1990   Quit date: 10/30/2020   Years since quitting: 3.3  Smokeless Tobacco Never  Tobacco Comments   Quit Jan 2022     Labs: Review Flowsheet  More data exists      Latest Ref Rng & Units 03/01/2021 09/06/2021 10/17/2022 02/11/2023 09/11/2023  Labs for ITP Cardiac and Pulmonary Rehab  Cholestrol <200 mg/dL 161  096  045  409  811   LDL (calc) mg/dL (calc) 914  92  92  76  154   HDL-C > OR = 50 mg/dL 32  37  38  34  38   Trlycerides <150 mg/dL 782  956  213  086  578   Hemoglobin A1c <5.7 % of total Hgb 5.7  5.6  - - 5.8      Pulmonary Assessment Scores:  Pulmonary Assessment Scores     Row Name 12/26/23 1616         CAT Score   CAT Score 9       mMRC Score   mMRC Score 3              UCSD: Self-administered rating of dyspnea associated with activities of daily living (ADLs) 6-point scale (0 = "not at all" to 5 = "maximal or unable to do because of breathlessness")  Scoring Scores range from 0 to 120.  Minimally important difference is 5 units  CAT: CAT can identify the health impairment of COPD patients and is better correlated with disease progression.  CAT has a scoring range of zero to 40. The CAT score is classified into four groups of low (less than 10), medium (10 - 20), high (21-30) and very high (31-40) based on the impact level of disease on health status. A CAT score over 10 suggests significant symptoms.  A worsening CAT score could be explained by an exacerbation, poor medication adherence, poor inhaler technique, or progression of COPD or comorbid conditions.  CAT MCID is 2 points  mMRC: mMRC (Modified Medical Research Council) Dyspnea Scale is used to assess the degree of baseline functional disability in patients of respiratory disease due to dyspnea. No minimal important difference is established. A decrease in score of 1 point or greater is considered a positive change.   Pulmonary Function Assessment:  Pulmonary Function Assessment - 12/23/23 1113       Breath   Shortness of Breath Yes;Panic with Shortness of Breath             Exercise Target  Goals: Exercise Program Goal: Individual exercise prescription set using results from initial 6 min walk test and THRR while considering  patient's activity barriers and safety.   Exercise Prescription Goal: Initial exercise prescription builds to 30-45 minutes a day of aerobic activity, 2-3 days per week.  Home exercise guidelines will be given to patient during program as part of exercise prescription  that the participant will acknowledge.  Education: Aerobic Exercise: - Group verbal and visual presentation on the components of exercise prescription. Introduces F.I.T.T principle from ACSM for exercise prescriptions.  Reviews F.I.T.T. principles of aerobic exercise including progression. Written material given at graduation.   Education: Resistance Exercise: - Group verbal and visual presentation on the components of exercise prescription. Introduces F.I.T.T principle from ACSM for exercise prescriptions  Reviews F.I.T.T. principles of resistance exercise including progression. Written material given at graduation.    Education: Exercise & Equipment Safety: - Individual verbal instruction and demonstration of equipment use and safety with use of the equipment. Flowsheet Row Pulmonary Rehab from 12/26/2023 in [facility] Card[facility] and Pulmonary Rehab  Date 12/26/23  Educator MB  Instruction Review Code 1- Verbalizes Understanding       Education: Exercise Physiology & General Exercise Guidelines: - Group verbal and written instruction with models to review the exercise physiology of the cardiovascular system and associated critical values. Provides general exercise guidelines with specific guidelines to those with heart or lung disease.    Education: Flexibility, Balance, Mind/Body Relaxation: - Group verbal and visual presentation with interactive activity on the components of exercise prescription. Introduces F.I.T.T principle from ACSM for exercise prescriptions. Reviews F.I.T.T. principles of  flexibility and balance exercise training including progression. Also discusses the mind body connection.  Reviews various relaxation techniques to help reduce and manage stress (i.e. Deep breathing, progressive muscle relaxation, and visualization). Balance handout provided to take home. Written material given at graduation.   Activity Barriers & Risk Stratification:  Activity Barriers & Cardiac Risk Stratification - 12/26/23 1608       Activity Barriers & Cardiac Risk Stratification   Activity Barriers Joint Problems;Arthritis   R shoulder bursitis and OA, Bilateral knee pain (occasionally)            6 Minute Walk:  6 Minute Walk     Row Name 12/26/23 1606         6 Minute Walk   Phase Initial     Distance 1230 feet     Walk Time 6 minutes     # of Rest Breaks 0     MPH 2.32     METS 2.59     RPE 17     Perceived Dyspnea  3     VO2 Peak 9.06     Symptoms Yes (comment)     Comments Bilateral hip pain 4/10     Resting HR 62 bpm     Resting BP 126/66     Resting Oxygen Saturation  88 %     Exercise Oxygen Saturation  during 6 min walk 83 %     Max Ex. HR 93 bpm     Max Ex. BP 160/60     2 Minute Post BP 136/70       Interval HR   1 Minute HR 73     2 Minute HR 80     3 Minute HR 88     4 Minute HR 91     5 Minute HR 91     6 Minute HR 93     2 Minute Post HR 52     Interval Heart Rate? Yes       Interval Oxygen   Interval Oxygen? Yes     Baseline Oxygen Saturation % 88 %     1 Minute Oxygen Saturation % 89 %     1 Minute Liters of Oxygen 0 L  2 Minute Oxygen Saturation % 91 %     2 Minute Liters of Oxygen 0 L     3 Minute Oxygen Saturation % 86 %     3 Minute Liters of Oxygen 0 L     4 Minute Oxygen Saturation % 83 %     4 Minute Liters of Oxygen 0 L     5 Minute Oxygen Saturation % 84 %     5 Minute Liters of Oxygen 0 L     6 Minute Oxygen Saturation % 85 %     6 Minute Liters of Oxygen 0 L     2 Minute Post Oxygen Saturation % 94 %     2  Minute Post Liters of Oxygen 0 L             Oxygen Initial Assessment:  Oxygen Initial Assessment - 12/23/23 1112       Home Oxygen   Home Oxygen Device None    Sleep Oxygen Prescription None    Home Exercise Oxygen Prescription None    Home Resting Oxygen Prescription None      Initial 6 min Walk   Oxygen Used None      Program Oxygen Prescription   Program Oxygen Prescription None      Intervention   Short Term Goals To learn and understand importance of monitoring SPO2 with pulse oximeter and demonstrate accurate use of the pulse oximeter.;To learn and demonstrate proper pursed lip breathing techniques or other breathing techniques. ;To learn and understand importance of maintaining oxygen saturations>88%;To learn and demonstrate proper use of respiratory medications;To learn and exhibit compliance with exercise, home and travel O2 prescription    Long  Term Goals Exhibits compliance with exercise, home  and travel O2 prescription;Maintenance of O2 saturations>88%;Compliance with respiratory medication;Demonstrates proper use of MDI's;Exhibits proper breathing techniques, such as pursed lip breathing or other method taught during program session;Verbalizes importance of monitoring SPO2 with pulse oximeter and return demonstration             Oxygen Re-Evaluation:  Oxygen Re-Evaluation     Row Name 12/30/23 1408 01/21/24 1128           Program Oxygen Prescription   Program Oxygen Prescription -- None        Home Oxygen   Home Oxygen Device -- None      Sleep Oxygen Prescription -- None      Home Exercise Oxygen Prescription -- None      Home Resting Oxygen Prescription -- None      Compliance with Home Oxygen Use -- Yes        Goals/Expected Outcomes   Short Term Goals -- To learn and demonstrate proper pursed lip breathing techniques or other breathing techniques.       Long  Term Goals -- Exhibits proper breathing techniques, such as pursed lip breathing or  other method taught during program session      Comments Reviewed PLB technique with pt.  Talked about how it works and it's importance in maintaining their exercise saturations. Reviewed PLB technique with pt.  Talked about how it works and it's importance in maintaining their exercise saturations.      Short: Become more profiecient at using PLB.   Long: Become independent at using PLB.      Goals/Expected Outcomes Short: Become more profiecient at using PLB.   Long: Become independent at using PLB. Short: Become more profiecient at using PLB.   Long: Become  independent at using PLB.               Oxygen Discharge (Final Oxygen Re-Evaluation):  Oxygen Re-Evaluation - 01/21/24 1128       Program Oxygen Prescription   Program Oxygen Prescription None      Home Oxygen   Home Oxygen Device None    Sleep Oxygen Prescription None    Home Exercise Oxygen Prescription None    Home Resting Oxygen Prescription None    Compliance with Home Oxygen Use Yes      Goals/Expected Outcomes   Short Term Goals To learn and demonstrate proper pursed lip breathing techniques or other breathing techniques.     Long  Term Goals Exhibits proper breathing techniques, such as pursed lip breathing or other method taught during program session    Comments Reviewed PLB technique with pt.  Talked about how it works and it's importance in maintaining their exercise saturations.      Short: Become more profiecient at using PLB.   Long: Become independent at using PLB.    Goals/Expected Outcomes Short: Become more profiecient at using PLB.   Long: Become independent at using PLB.             Initial Exercise Prescription:  Initial Exercise Prescription - 12/26/23 1600       Date of Initial Exercise RX and Referring Provider   Date 12/26/23    Referring Provider Vergia Glasgow, MD      Oxygen   Maintain Oxygen Saturation 88% or higher      Recumbant Bike   Level 2    RPM 50    Watts 15    Minutes  15    METs 2.59      NuStep   Level 2    SPM 80    Minutes 15    METs 2.59      T5 Nustep   Level 2    SPM 80    Minutes 15    METs 2.59      Biostep-RELP   Level 2    SPM 50    Minutes 15    METs 2.59      Track   Laps 20    Minutes 15    METs 2.09      Prescription Details   Frequency (times per week) 2    Duration Progress to 30 minutes of continuous aerobic without signs/symptoms of physical distress      Intensity   THRR 40-80% of Max Heartrate 95-129    Ratings of Perceived Exertion 11-13    Perceived Dyspnea 0-4      Progression   Progression Continue to progress workloads to maintain intensity without signs/symptoms of physical distress.      Resistance Training   Training Prescription Yes    Weight 5 lb    Reps 10-15             Perform Capillary Blood Glucose checks as needed.  Exercise Prescription Changes:   Exercise Prescription Changes     Row Name 12/26/23 1600 01/16/24 1800 01/29/24 0800 02/12/24 1400 02/27/24 0900     Response to Exercise   Blood Pressure (Admit) 126/66 144/66 114/56 128/64 136/64   Blood Pressure (Exercise) 160/60 142/60 142/66 126/72 148/66   Blood Pressure (Exit) 136/70 124/68 106/60 124/62 112/68   Heart Rate (Admit) 62 bpm 62 bpm 72 bpm 74 bpm 78 bpm   Heart Rate (Exercise) 93 bpm 94 bpm 104 bpm  89 bpm 101 bpm   Heart Rate (Exit) 52 bpm 63 bpm 72 bpm 74 bpm 83 bpm   Oxygen Saturation (Admit) 88 % 92 % 91 % 94 % 91 %   Oxygen Saturation (Exercise) 83 % 89 % 85 % 91 % 88 %   Oxygen Saturation (Exit) 94 % 90 % 92 % 93 % 92 %   Rating of Perceived Exertion (Exercise) 17 15 12 11 15    Perceived Dyspnea (Exercise) 3 3 2 1  0   Symptoms Bilateral hip pain 4/10 none none none none   Comments results first 2 weeks of exercise -- -- --   Duration Progress to 30 minutes of  aerobic without signs/symptoms of physical distress Progress to 30 minutes of  aerobic without signs/symptoms of physical distress Continue  with 30 min of aerobic exercise without signs/symptoms of physical distress. Continue with 30 min of aerobic exercise without signs/symptoms of physical distress. Continue with 30 min of aerobic exercise without signs/symptoms of physical distress.   Intensity THRR New THRR New THRR unchanged THRR unchanged THRR unchanged     Progression   Progression Continue to progress workloads to maintain intensity without signs/symptoms of physical distress. Continue to progress workloads to maintain intensity without signs/symptoms of physical distress. Continue to progress workloads to maintain intensity without signs/symptoms of physical distress. Continue to progress workloads to maintain intensity without signs/symptoms of physical distress. Continue to progress workloads to maintain intensity without signs/symptoms of physical distress.   Average METs 2.59 2.15 3.45 2.87 3.79     Resistance Training   Training Prescription -- Yes Yes Yes Yes   Weight -- 5 lb 5 lb 5 lb 5 lb   Reps -- 10-15 10-15 10-15 10-15     Interval Training   Interval Training -- No No No No     NuStep   Level -- -- 2 -- 5   Minutes -- -- 15 -- 15   METs -- -- 5.2 -- 4.8     T5 Nustep   Level -- 2 2 3  --   Minutes -- 15 15 15  --   METs -- 2.1 2 2.5 --     Biostep-RELP   Level -- 2 -- -- 4   Minutes -- 15 -- -- 15   METs -- 2 -- -- --     Track   Laps -- 26 42 42 42   Minutes -- 15 15 15 15    METs -- 2.41 3.28 3.25 3.28     Oxygen   Maintain Oxygen Saturation -- 88% or higher 88% or higher 88% or higher 88% or higher            Exercise Comments:   Exercise Comments     Row Name 12/30/23 1406 01/21/24 1127         Exercise Comments First full day of exercise!  Patient was oriented to gym and equipment including functions, settings, policies, and procedures.  Patient's individual exercise prescription and treatment plan were reviewed.  All starting workloads were established based on the results of  the 6 minute walk test done at initial orientation visit.  The plan for exercise progression was also introduced and progression will be customized based on patient's performance and goals. First full day of exercise!  Patient was oriented to gym and equipment including functions, settings, policies, and procedures.  Patient's individual exercise prescription and treatment plan were reviewed.  All starting workloads were established based on the  results of the 6 minute walk test done at initial orientation visit.  The plan for exercise progression was also introduced and progression will be customized based on patient's performance and goals.               Exercise Goals and Review:   Exercise Goals     Row Name 12/26/23 1614             Exercise Goals   Increase Physical Activity Yes       Intervention Provide advice, education, support and counseling about physical activity/exercise needs.;Develop an individualized exercise prescription for aerobic and resistive training based on initial evaluation findings, risk stratification, comorbidities and participant's personal goals.       Expected Outcomes Short Term: Attend rehab on a regular basis to increase amount of physical activity.;Long Term: Add in home exercise to make exercise part of routine and to increase amount of physical activity.;Long Term: Exercising regularly at least 3-5 days a week.       Increase Strength and Stamina Yes       Intervention Provide advice, education, support and counseling about physical activity/exercise needs.;Develop an individualized exercise prescription for aerobic and resistive training based on initial evaluation findings, risk stratification, comorbidities and participant's personal goals.       Expected Outcomes Short Term: Increase workloads from initial exercise prescription for resistance, speed, and METs.;Short Term: Perform resistance training exercises routinely during rehab and add in  resistance training at home;Long Term: Improve cardiorespiratory fitness, muscular endurance and strength as measured by increased METs and functional capacity ( )       Able to understand and use rate of perceived exertion (RPE) scale Yes       Intervention Provide education and explanation on how to use RPE scale       Expected Outcomes Short Term: Able to use RPE daily in rehab to express subjective intensity level;Long Term:  Able to use RPE to guide intensity level when exercising independently       Able to understand and use Dyspnea scale Yes       Intervention Provide education and explanation on how to use Dyspnea scale       Expected Outcomes Short Term: Able to use Dyspnea scale daily in rehab to express subjective sense of shortness of breath during exertion;Long Term: Able to use Dyspnea scale to guide intensity level when exercising independently       Knowledge and understanding of Target Heart Rate Range (THRR) Yes       Intervention Provide education and explanation of THRR including how the numbers were predicted and where they are located for reference       Expected Outcomes Short Term: Able to state/look up THRR;Short Term: Able to use daily as guideline for intensity in rehab;Long Term: Able to use THRR to govern intensity when exercising independently       Able to check pulse independently Yes       Intervention Provide education and demonstration on how to check pulse in carotid and radial arteries.;Review the importance of being able to check your own pulse for safety during independent exercise       Expected Outcomes Short Term: Able to explain why pulse checking is important during independent exercise;Long Term: Able to check pulse independently and accurately       Understanding of Exercise Prescription Yes       Intervention Provide education, explanation, and written materials on patient's individual exercise prescription  Expected Outcomes Short Term: Able to  explain program exercise prescription;Long Term: Able to explain home exercise prescription to exercise independently                Exercise Goals Re-Evaluation :  Exercise Goals Re-Evaluation     Row Name 12/30/23 1407 01/16/24 1823 01/21/24 1127 01/29/24 0805 02/12/24 1435     Exercise Goal Re-Evaluation   Exercise Goals Review Able to understand and use rate of perceived exertion (RPE) scale;Increase Physical Activity;Knowledge and understanding of Target Heart Rate Range (THRR);Understanding of Exercise Prescription;Increase Strength and Stamina;Able to check pulse independently Increase Physical Activity;Increase Strength and Stamina;Understanding of Exercise Prescription Able to understand and use rate of perceived exertion (RPE) scale;Able to understand and use Dyspnea scale;Knowledge and understanding of Target Heart Rate Range (THRR);Understanding of Exercise Prescription Increase Physical Activity;Understanding of Exercise Prescription;Increase Strength and Stamina Increase Physical Activity;Understanding of Exercise Prescription;Increase Strength and Stamina   Comments Reviewed RPE and dyspnea scale, THR and program prescription with pt today.  Pt voiced understanding and was given a copy of goals to take home. Kristin Parker is off to a good start in the program. She was able to use all of her prescribed exercise machines/ modalities. She was also able to increase her laps on the track from 20 to 26 in 15 minutes. We will continue to monitor her progress in the program. Reviewed RPE and dyspnea scale, THR and program prescription with pt today.  Pt voiced understanding and was given a copy of goals to take home.       Short: Use RPE daily to regulate intensity.  Long: Follow program prescription in THR. Kristin Parker is doing well in the program. She has increased her laps on the track to 42 laps from 26 laps. She has maintained level 2 on the T4 and T5 nusteps. We will continue to monitor her progress  in the program. Kristin Parker continues to do well in the program. She was recently able to increase from level 2 to 3 on the T5 nustep. She was also able to maintain 42 laps on the track in 15 minutes. We will continue to monitor her progress in the program.   Expected Outcomes Short: Use RPE daily to regulate intensity.  Long: Follow program prescription in THR. Short: Continue to follow exercise prescription. Long: Continue exercise to improve strength and stamina. Short: Use RPE daily to regulate intensity. Long: Follow program prescription in THR. Short: Try level 3 on the T4 and T5 nusteps. Long: Continue exercise to improve strength and stamina. Short: Push for more laps on the track. Long: Conitinue exercise to improve strength and stamina.    Row Name 02/27/24 0948             Exercise Goal Re-Evaluation   Exercise Goals Review Increase Physical Activity;Understanding of Exercise Prescription;Increase Strength and Stamina       Comments Kristin Parker continues to do well in the program. She increased to level 5 on the T4 nustep and level 4 on the biostep. She also maintained 42 laps on the track. We will continue to monitor her progress in the program.       Expected Outcomes Short: Continue to push for more laps on the track. Long: Conitinue exercise to improve strength and stamina.                Discharge Exercise Prescription (Final Exercise Prescription Changes):  Exercise Prescription Changes - 02/27/24 0900       Response to Exercise  Blood Pressure (Admit) 136/64    Blood Pressure (Exercise) 148/66    Blood Pressure (Exit) 112/68    Heart Rate (Admit) 78 bpm    Heart Rate (Exercise) 101 bpm    Heart Rate (Exit) 83 bpm    Oxygen Saturation (Admit) 91 %    Oxygen Saturation (Exercise) 88 %    Oxygen Saturation (Exit) 92 %    Rating of Perceived Exertion (Exercise) 15    Perceived Dyspnea (Exercise) 0    Symptoms none    Duration Continue with 30 min of aerobic exercise without  signs/symptoms of physical distress.    Intensity THRR unchanged      Progression   Progression Continue to progress workloads to maintain intensity without signs/symptoms of physical distress.    Average METs 3.79      Resistance Training   Training Prescription Yes    Weight 5 lb    Reps 10-15      Interval Training   Interval Training No      NuStep   Level 5    Minutes 15    METs 4.8      Biostep-RELP   Level 4    Minutes 15      Track   Laps 42    Minutes 15    METs 3.28      Oxygen   Maintain Oxygen Saturation 88% or higher             Nutrition:  Target Goals: Understanding of nutrition guidelines, daily intake of sodium 1500mg , cholesterol 200mg , calories 30% from fat and 7% or less from saturated fats, daily to have 5 or more servings of fruits and vegetables.  Education: All About Nutrition: -Group instruction provided by verbal, written material, interactive activities, discussions, models, and posters to present general guidelines for heart healthy nutrition including fat, fiber, MyPlate, the role of sodium in heart healthy nutrition, utilization of the nutrition label, and utilization of this knowledge for meal planning. Follow up email sent as well. Written material given at graduation.   Biometrics:  Pre Biometrics - 12/26/23 1614       Pre Biometrics   Height 5' 5.7" (1.669 m)    Weight 177 lb 3.2 oz (80.4 kg)    Waist Circumference 45 inches    Hip Circumference 42 inches    Waist to Hip Ratio 1.07 %    BMI (Calculated) 28.85    Single Leg Stand 30 seconds              Nutrition Therapy Plan and Nutrition Goals:  Nutrition Therapy & Goals - 12/26/23 1617       Nutrition Therapy   RD appointment deferred Yes      Intervention Plan   Intervention Prescribe, educate and counsel regarding individualized specific dietary modifications aiming towards targeted core components such as weight, hypertension, lipid management, diabetes,  heart failure and other comorbidities.    Expected Outcomes Short Term Goal: Understand basic principles of dietary content, such as calories, fat, sodium, cholesterol and nutrients.             Nutrition Assessments:  MEDIFICTS Score Key: >=70 Need to make dietary changes  40-70 Heart Healthy Diet <= 40 Therapeutic Level Cholesterol Diet   Picture Your Plate Scores: <16 Unhealthy dietary pattern with much room for improvement. 41-50 Dietary pattern unlikely to meet recommendations for good health and room for improvement. 51-60 More healthful dietary pattern, with some room for improvement.  >60 Healthy  dietary pattern, although there may be some specific behaviors that could be improved.   Nutrition Goals Re-Evaluation:   Nutrition Goals Discharge (Final Nutrition Goals Re-Evaluation):   Psychosocial: Target Goals: Acknowledge presence or absence of significant depression and/or stress, maximize coping skills, provide positive support system. Participant is able to verbalize types and ability to use techniques and skills needed for reducing stress and depression.   Education: Stress, Anxiety, and Depression - Group verbal and visual presentation to define topics covered.  Reviews how body is impacted by stress, anxiety, and depression.  Also discusses healthy ways to reduce stress and to treat/manage anxiety and depression.  Written material given at graduation.   Education: Sleep Hygiene -Provides group verbal and written instruction about how sleep can affect your health.  Define sleep hygiene, discuss sleep cycles and impact of sleep habits. Review good sleep hygiene tips.    Initial Review & Psychosocial Screening:  Initial Psych Review & Screening - 12/23/23 1114       Initial Review   Current issues with Current Psychotropic Meds;Current Depression;History of Depression      Family Dynamics   Good Support System? Yes    Comments She can look to her son and  daughters for support. 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      Barriers   Psychosocial barriers to participate in program The patient should benefit from training in stress management and relaxation.      Screening Interventions   Interventions Encouraged to exercise;Program counselor consult;To provide support and resources with identified psychosocial needs;Provide feedback about the scores to participant    Expected Outcomes Short Term goal: Utilizing psychosocial counselor, staff and physician to assist with identification of specific Stressors or current issues interfering with healing process. Setting desired goal for each stressor or current issue identified.;Long Term Goal: Stressors or current issues are controlled or eliminated.;Short Term goal: Identification and review with participant of any Quality of Life or Depression concerns found by scoring the questionnaire.;Long Term goal: The participant improves quality of Life and PHQ9 Scores as seen by post scores and/or verbalization of changes             Quality of Life Scores:  Scores of 19 and below usually indicate a poorer quality of life in these areas.  A difference of  2-3 points is a clinically meaningful difference.  A difference of 2-3 points in the total score of the Quality of Life Index has been associated with significant improvement in overall quality of life, self-image, physical symptoms, and general health in studies assessing change in quality of life.  PHQ-9: Review Flowsheet  More data exists      12/26/2023 02/07/2023 01/05/2022 09/11/2021 03/22/2021  Depression screen PHQ 2/9  Decreased Interest 1 0 0 0 0  Down, Depressed, Hopeless 1 1 0 0 0  PHQ - 2 Score 2 1 0 0 0  Altered sleeping 3 1 - 1 -  Tired, decreased energy 1 1 - 0 -  Change in appetite 0 0 - 0 -  Feeling bad or failure about yourself  2 0 - 0 -  Trouble concentrating 0 1 - 0 -  Moving slowly or  fidgety/restless 1 0 - 0 -  Suicidal thoughts 0 0 - 0 -  PHQ-9 Score 9 4 - 1 -  Difficult doing work/chores Somewhat difficult Not difficult at all - Somewhat difficult -   Interpretation of Total Score  Total Score Depression Severity:  1-4 = Minimal depression, 5-9 = Mild depression, 10-14 = Moderate depression, 15-19 = Moderately severe depression, 20-27 = Severe depression   Psychosocial Evaluation and Intervention:  Psychosocial Evaluation - 12/23/23 1116       Psychosocial Evaluation & Interventions   Interventions Encouraged to exercise with the program and follow exercise prescription;Relaxation education    Comments She can look to her son and daughters for support. 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    Expected Outcomes Short: Attend [facility] regularly to improve shortness of breath with ADL's. Long: maintain independence with ADL's    Continue Psychosocial Services  Follow up required by staff             Psychosocial Re-Evaluation:   Psychosocial Discharge (Final Psychosocial Re-Evaluation):   Education: Education Goals: Education classes will be provided on a weekly basis, covering required topics. Participant will state understanding/return demonstration of topics presented.  Learning Barriers/Preferences:  Learning Barriers/Preferences - 12/23/23 1113       Learning Barriers/Preferences   Learning Barriers None    Learning Preferences None             General Pulmonary Education Topics:  Infection Prevention: - Provides verbal and written material to individual with discussion of infection control including proper hand washing and proper equipment cleaning during exercise session. Flowsheet Row Pulmonary Rehab from 12/26/2023 in [facility] Card[facility] Pulmonary Rehab  Date 12/26/23  Educator MB  Instruction Review Code 1- Verbalizes Understanding       Falls Prevention: - Provides verbal and written  material to individual with discussion of falls prevention and safety. Flowsheet Row Pulmonary Rehab from 12/26/2023 in [facility] Cardiac and Pulmonary Rehab  Date 12/26/23  Educator MB  Instruction Review Code 1- Verbalizes Understanding       Chronic Lung Disease Review: - Group verbal instruction with posters, models, PowerPoint presentations and videos,  to review new updates, new respiratory medications, new advancements in procedures and treatments. Providing information on websites and "800" numbers for continued self-education. Includes information about supplement oxygen, available portable oxygen systems, continuous and intermittent flow rates, oxygen safety, concentrators, and Medicare reimbursement for oxygen. Explanation of Pulmonary Drugs, including class, frequency, complications, importance of spacers, rinsing mouth after steroid MDI's, and proper cleaning methods for nebulizers. Review of basic lung anatomy and physiology related to function, structure, and complications of lung disease. Review of risk factors. Discussion about methods for diagnosing sleep apnea and types of masks and machines for OSA. Includes a review of the use of types of environmental controls: home humidity, furnaces, filters, dust mite/pet prevention, HEPA vacuums. Discussion about weather changes, air quality and the benefits of nasal washing. Instruction on Warning signs, infection symptoms, calling MD promptly, preventive modes, and value of vaccinations. Review of effective airway clearance, coughing and/or vibration techniques. Emphasizing that all should Create an Action Plan. Written material given at graduation.   AED/CPR: - Group verbal and written instruction with the use of models to demonstrate the basic use of the AED with the basic ABC's of resuscitation.    Anatomy and Cardiac Procedures: - Group verbal and visual presentation and models provide information about basic cardiac anatomy and function.  Reviews the testing methods done to diagnose heart disease and the outcomes of the test results. Describes the treatment choices: Medical Management, Angioplasty, or Coronary Bypass Surgery for treating various heart conditions including Myocardial Infarction, Angina, Valve Disease, and Cardiac Arrhythmias.  Written material given at graduation.   Medication Safety: - Group verbal and visual instruction to review commonly prescribed medications for heart and lung disease. Reviews the medication, class of the drug, and side effects. Includes the steps to properly store meds and maintain the prescription regimen.  Written material given at graduation.   Other: -Provides group and verbal instruction on various topics (see comments)   Knowledge Questionnaire Score:    Core Components/Risk Factors/Patient Goals at Admission:  Personal Goals and Risk Factors at Admission - 12/26/23 1617       Core Components/Risk Factors/Patient Goals on Admission    Weight Management Yes;Weight Loss    Intervention Weight Management: Develop a combined nutrition and exercise program designed to reach desired caloric intake, while maintaining appropriate intake of nutrient and fiber, sodium and fats, and appropriate energy expenditure required for the weight goal.;Weight Management: Provide education and appropriate resources to help participant work on and attain dietary goals.;Weight Management/Obesity: Establish reasonable short term and long term weight goals.    Admit Weight 177 lb 3.2 oz (80.4 kg)    Goal Weight: Short Term 160 lb (72.6 kg)    Goal Weight: Long Term 150 lb (68 kg)    Expected Outcomes Short Term: Continue to assess and modify interventions until short term weight is achieved;Weight Maintenance: Understanding of the daily nutrition guidelines, which includes 25-35% calories from fat, 7% or less cal from saturated fats, less than 200mg  cholesterol, less than 1.5gm of sodium, & 5 or more  servings of fruits and vegetables daily;Weight Loss: Understanding of general recommendations for a balanced deficit meal plan, which promotes 1-2 lb weight loss per week and includes a negative energy balance of [phone] kcal/d;Understanding recommendations for meals to include 15-35% energy as protein, 25-35% energy from fat, 35-60% energy from carbohydrates, less than 200mg  of dietary cholesterol, 20-35 gm of total fiber daily;Understanding of distribution of calorie intake throughout the day with the consumption of 4-5 meals/snacks    Improve shortness of breath with ADL's Yes    Intervention Provide education, individualized exercise plan and daily activity instruction to help decrease symptoms of SOB with activities of daily living.    Expected Outcomes Short Term: Improve cardiorespiratory fitness to achieve a reduction of symptoms when performing ADLs;Long Term: Be able to perform more ADLs without symptoms or delay the onset of symptoms    Hypertension Yes    Intervention Provide education on lifestyle modifcations including regular physical activity/exercise, weight management, moderate sodium restriction and increased consumption of fresh fruit, vegetables, and low fat dairy, alcohol moderation, and smoking cessation.;Monitor prescription use compliance.    Expected Outcomes Short Term: Continued assessment and intervention until BP is < 140/45mm HG in hypertensive participants. < 130/47mm HG in hypertensive participants with diabetes, heart failure or chronic kidney disease.;Long Term: Maintenance of blood pressure at goal levels.    Lipids Yes    Intervention Provide education and support for participant on nutrition & aerobic/resistive exercise along with prescribed medications to achieve LDL 70mg , HDL >40mg .    Expected Outcomes Short Term: Participant states understanding of desired cholesterol values and is compliant with medications prescribed. Participant is following exercise  prescription and nutrition guidelines.;Long Term: Cholesterol controlled with medications as prescribed, with individualized exercise RX and with personalized nutrition plan. Value goals: LDL < 70mg , HDL > 40 mg.             Education:Diabetes - Individual verbal and written instruction to review signs/symptoms of diabetes, desired ranges of  glucose level fasting, after meals and with exercise. Acknowledge that pre and post exercise glucose checks will be done for 3 sessions at entry of program.   Know Your Numbers and Heart Failure: - Group verbal and visual instruction to discuss disease risk factors for cardiac and pulmonary disease and treatment options.  Reviews associated critical values for Overweight/Obesity, Hypertension, Cholesterol, and Diabetes.  Discusses basics of heart failure: signs/symptoms and treatments.  Introduces Heart Failure Zone chart for action plan for heart failure.  Written material given at graduation.   Core Components/Risk Factors/Patient Goals Review:    Core Components/Risk Factors/Patient Goals at Discharge (Final Review):    ITP Comments:  ITP Comments     Row Name 12/23/23 1112 12/26/23 1606 12/30/23 1405 01/08/24 0818 01/21/24 1127   ITP Comments Virtual Visit completed. Patient informed on EP and RD appointment and 6 Minute walk test. Patient also informed of patient health questionnaires on My Chart. Patient Verbalizes understanding. Visit diagnosis can be found in [facility] 10/18/2023. Completed and gym orientation. Initial ITP created and sent for review to Dr. Faud Aleskerov, Medical Director. First full day of exercise!  Patient was oriented to gym and equipment including functions, settings, policies, and procedures.  Patient's individual exercise prescription and treatment plan were reviewed.  All starting workloads were established based on the results of the 6 minute walk test done at initial orientation visit.  The plan for exercise  progression was also introduced and progression will be customized based on patient's performance and goals. 30 Day review completed. Medical Director ITP review done, changes made as directed, and signed approval by Medical Director. New to program. wrong chart  not her first day  SB 01/21/2024    Row Name 02/05/24 0940           ITP Comments 30 Day review completed. Medical Director ITP review done, changes made as directed, and signed approval by Medical Director.                Comments: 30 day review

## 2024-03-05 ENCOUNTER — Encounter

## 2024-03-05 ENCOUNTER — Ambulatory Visit: Payer: Medicare HMO | Admitting: Student in an Organized Health Care Education/Training Program

## 2024-03-10 ENCOUNTER — Encounter

## 2024-03-11 ENCOUNTER — Encounter: Payer: Self-pay | Admitting: *Deleted

## 2024-03-11 DIAGNOSIS — J439 Emphysema, unspecified: Secondary | ICD-10-CM

## 2024-03-11 NOTE — Progress Notes (Signed)
 Early Discharge Summary   Kristin Parker DOB: 12/31/1950  [name] has not attended since 4/17. Staff have left messages and sent her a discharge letter with no response. Discharging at this time from program. She has completed 10 of 36 sessions    6 Minute Walk     Row Name 12/26/23 1606         6 Minute Walk   Phase Initial     Distance 1230 feet     Walk Time 6 minutes     # of Rest Breaks 0     MPH 2.32     METS 2.59     RPE 17     Perceived Dyspnea  3     VO2 Peak 9.06     Symptoms Yes (comment)     Comments Bilateral hip pain 4/10     Resting HR 62 bpm     Resting BP 126/66     Resting Oxygen Saturation  88 %     Exercise Oxygen Saturation  during 6 min walk 83 %     Max Ex. HR 93 bpm     Max Ex. BP 160/60     2 Minute Post BP 136/70       Interval HR   1 Minute HR 73     2 Minute HR 80     3 Minute HR 88     4 Minute HR 91     5 Minute HR 91     6 Minute HR 93     2 Minute Post HR 52     Interval Heart Rate? Yes       Interval Oxygen   Interval Oxygen? Yes     Baseline Oxygen Saturation % 88 %     1 Minute Oxygen Saturation % 89 %     1 Minute Liters of Oxygen 0 L     2 Minute Oxygen Saturation % 91 %     2 Minute Liters of Oxygen 0 L     3 Minute Oxygen Saturation % 86 %     3 Minute Liters of Oxygen 0 L     4 Minute Oxygen Saturation % 83 %     4 Minute Liters of Oxygen 0 L     5 Minute Oxygen Saturation % 84 %     5 Minute Liters of Oxygen 0 L     6 Minute Oxygen Saturation % 85 %     6 Minute Liters of Oxygen 0 L     2 Minute Post Oxygen Saturation % 94 %     2 Minute Post Liters of Oxygen 0 L

## 2024-03-11 NOTE — Progress Notes (Signed)
 Pulmonary Individual Treatment Plan  Patient Details  Name: Kristin Parker MRN: [id number] Date of Birth: Apr 09, 1951 Referring Provider:   Flowsheet Row Pulmonary Rehab from 12/26/2023 in [facility] Cardiac and Pulmonary Rehab  Referring Provider Vergia Glasgow, MD       Initial Encounter Date:  Flowsheet Row Pulmonary Rehab from 12/26/2023 in [facility] Cardiac and Pulmonary Rehab  Date 12/26/23       Visit Diagnosis: Pulmonary emphysema, unspecified emphysema type (HCC)  Patient's Home Medications on Admission:  Current Outpatient Medications:    REPATHA  SURECLICK 140 MG/ML SOAJ, INJECT 1 DOSE INTO THE SKIN EVERY 14 DAYS, Disp: 6 mL, Rfl: 0   acetaminophen  (TYLENOL ) 500 MG tablet, Take 2 tablets (1,000 mg total) by mouth every 6 (six) hours as needed for mild pain., Disp: , Rfl:    acetic acid -hydrocortisone  (VOSOL -HC) OTIC solution, Place 3 drops into both ears 3 (three) times daily. May use wick for first 24 hours (Patient not taking: Reported on 12/23/2023), Disp: 10 mL, Rfl: 0   albuterol  (VENTOLIN  HFA) 108 (90 Base) MCG/ACT inhaler, INHALE 2 PUFFS EVERY 4 HOURS FOR WHEEZING OR SHORTNESS OF BREATH AS NEEDED, Disp: 18 g, Rfl: 3   amLODipine  (NORVASC ) 10 MG tablet, TAKE 1 TABLET BY MOUTH ONCE DAILY, Disp: 90 tablet, Rfl: 1   aspirin  EC 81 MG tablet, Take 1 tablet (81 mg total) by mouth daily., Disp: , Rfl:    busPIRone  (BUSPAR ) 5 MG tablet, Take 1 tablet (5 mg total) by mouth 2 (two) times daily., Disp: 180 tablet, Rfl: 3   estradiol  (ESTRACE ) 0.1 MG/GM vaginal cream, Apply 1 gram per vagina every night for 2 weeks, then apply three times a week, Disp: 42.5 g, Rfl: 5   famotidine  (PEPCID ) 20 MG tablet, TAKE 1 TABLET BY MOUTH TWICE DAILY, Disp: 180 tablet, Rfl: 3   fluticasone  (FLONASE ) 50 MCG/ACT nasal spray, USE 2 SPRAYS INTO EACH NOSTRIL ONCE DAILY, Disp: 48 g, Rfl: 0   Fluticasone -Umeclidin-Vilant (TRELEGY ELLIPTA ) 200-62.5-25 MCG/ACT AEPB, Inhale 1 Inhalation into the lungs daily., Disp: 30  each, Rfl: 12   hydrOXYzine  (VISTARIL ) 25 MG capsule, TAKE 1 CAPSULE BY MOUTH EVERY 8 HOURS ASNEEDED, Disp: 270 capsule, Rfl: 0   icosapent  Ethyl (VASCEPA ) 1 g capsule, Take 2 capsules (2 g total) by mouth 2 (two) times daily. (Patient not taking: Reported on 12/23/2023), Disp: 360 capsule, Rfl: 3   isosorbide  mononitrate (IMDUR ) 60 MG 24 hr tablet, TAKE 1 TABLET BY MOUTH ONCE DAILY, Disp: 30 tablet, Rfl: 11   losartan  (COZAAR ) 100 MG tablet, Take 1 tablet (100 mg total) by mouth daily., Disp: 90 tablet, Rfl: 1   meloxicam  (MOBIC ) 15 MG tablet, TAKE 1 TABLET BY MOUTH ONCE DAILY AS NEEDED FOR PAIN WITH FOOD, Disp: 30 tablet, Rfl: 5   metoprolol  tartrate (LOPRESSOR ) 100 MG tablet, Take 1 tablet (100 mg total) by mouth once for 1 dose. 2 hours prior to CT, Disp: 1 tablet, Rfl: 0   rizatriptan  (MAXALT -MLT) 10 MG disintegrating tablet, DISSOLVE 1 TABLET ON THE TONGUE AS NEEDED FOR MIGRAINE. MAY REPEAT IN 2 HOURS IF NEEDED, Disp: 10 tablet, Rfl: 2   sertraline  (ZOLOFT ) 100 MG tablet, TAKE 1 TABLET BY MOUTH ONCE DAILY, Disp: 90 tablet, Rfl: 1   WEGOVY  0.5 MG/0.5ML SOAJ, Inject 0.5 mg into the skin once a week., Disp: 2 mL, Rfl: 2   zolpidem  (AMBIEN  CR) 6.25 MG CR tablet, Take 1 tablet (6.25 mg total) by mouth at bedtime as needed. for sleep, Disp: 30  tablet, Rfl: 5  Past Medical History: Past Medical History:  Diagnosis Date   Anxiety    Aortic valve sclerosis    Bilateral carotid bruits    Carotid artery stenosis    COPD (chronic obstructive pulmonary disease) (HCC)    Hypertension    Mitral regurgitation    Mixed hyperlipidemia    Shoulder pain    Sleeping difficulties     Tobacco Use: Social History   Tobacco Use  Smoking Status Former   Current packs/day: 0.00   Average packs/day: 0.5 packs/day for 30.0 years (15.0 ttl pk-yrs)   Types: Cigarettes   Start date: 10/30/1990   Quit date: 10/30/2020   Years since quitting: 3.3  Smokeless Tobacco Never  Tobacco Comments   Quit Jan 2022     Labs: Review Flowsheet  More data exists      Latest Ref Rng & Units 03/01/2021 09/06/2021 10/17/2022 02/11/2023 09/11/2023  Labs for ITP Cardiac and Pulmonary Rehab  Cholestrol <200 mg/dL 409  811  914  782  956   LDL (calc) mg/dL (calc) 213  92  92  76  154   HDL-C > OR = 50 mg/dL 32  37  38  34  38   Trlycerides <150 mg/dL 086  578  469  629  528   Hemoglobin A1c <5.7 % of total Hgb 5.7  5.6  - - 5.8      Pulmonary Assessment Scores:  Pulmonary Assessment Scores     Row Name 12/26/23 1616         CAT Score   CAT Score 9       mMRC Score   mMRC Score 3              UCSD: Self-administered rating of dyspnea associated with activities of daily living (ADLs) 6-point scale (0 = "not at all" to 5 = "maximal or unable to do because of breathlessness")  Scoring Scores range from 0 to 120.  Minimally important difference is 5 units  CAT: CAT can identify the health impairment of COPD patients and is better correlated with disease progression.  CAT has a scoring range of zero to 40. The CAT score is classified into four groups of low (less than 10), medium (10 - 20), high (21-30) and very high (31-40) based on the impact level of disease on health status. A CAT score over 10 suggests significant symptoms.  A worsening CAT score could be explained by an exacerbation, poor medication adherence, poor inhaler technique, or progression of COPD or comorbid conditions.  CAT MCID is 2 points  mMRC: mMRC (Modified Medical Research Council) Dyspnea Scale is used to assess the degree of baseline functional disability in patients of respiratory disease due to dyspnea. No minimal important difference is established. A decrease in score of 1 point or greater is considered a positive change.   Pulmonary Function Assessment:  Pulmonary Function Assessment - 12/23/23 1113       Breath   Shortness of Breath Yes;Panic with Shortness of Breath             Exercise Target  Goals: Exercise Program Goal: Individual exercise prescription set using results from initial 6 min walk test and THRR while considering  patient's activity barriers and safety.   Exercise Prescription Goal: Initial exercise prescription builds to 30-45 minutes a day of aerobic activity, 2-3 days per week.  Home exercise guidelines will be given to patient during program as part of exercise prescription  that the participant will acknowledge.  Education: Aerobic Exercise: - Group verbal and visual presentation on the components of exercise prescription. Introduces F.I.T.T principle from ACSM for exercise prescriptions.  Reviews F.I.T.T. principles of aerobic exercise including progression. Written material given at graduation.   Education: Resistance Exercise: - Group verbal and visual presentation on the components of exercise prescription. Introduces F.I.T.T principle from ACSM for exercise prescriptions  Reviews F.I.T.T. principles of resistance exercise including progression. Written material given at graduation.    Education: Exercise & Equipment Safety: - Individual verbal instruction and demonstration of equipment use and safety with use of the equipment. Flowsheet Row Pulmonary Rehab from 12/26/2023 in [facility] Cardiac and Pulmonary Rehab  Date 12/26/23  Educator MB  Instruction Review Code 1- Verbalizes Understanding       Education: Exercise Physiology & General Exercise Guidelines: - Group verbal and written instruction with models to review the exercise physiology of the cardiovascular system and associated critical values. Provides general exercise guidelines with specific guidelines to those with heart or lung disease.    Education: Flexibility, Balance, Mind/Body Relaxation: - Group verbal and visual presentation with interactive activity on the components of exercise prescription. Introduces F.I.T.T principle from ACSM for exercise prescriptions. Reviews F.I.T.T. principles of  flexibility and balance exercise training including progression. Also discusses the mind body connection.  Reviews various relaxation techniques to help reduce and manage stress (i.e. Deep breathing, progressive muscle relaxation, and visualization). Balance handout provided to take home. Written material given at graduation.   Activity Barriers & Risk Stratification:  Activity Barriers & Cardiac Risk Stratification - 12/26/23 1608       Activity Barriers & Cardiac Risk Stratification   Activity Barriers Joint Problems;Arthritis   R shoulder bursitis and OA, Bilateral knee pain (occasionally)            6 Minute Walk:  6 Minute Walk     Row Name 12/26/23 1606         6 Minute Walk   Phase Initial     Distance 1230 feet     Walk Time 6 minutes     # of Rest Breaks 0     MPH 2.32     METS 2.59     RPE 17     Perceived Dyspnea  3     VO2 Peak 9.06     Symptoms Yes (comment)     Comments Bilateral hip pain 4/10     Resting HR 62 bpm     Resting BP 126/66     Resting Oxygen Saturation  88 %     Exercise Oxygen Saturation  during 6 min walk 83 %     Max Ex. HR 93 bpm     Max Ex. BP 160/60     2 Minute Post BP 136/70       Interval HR   1 Minute HR 73     2 Minute HR 80     3 Minute HR 88     4 Minute HR 91     5 Minute HR 91     6 Minute HR 93     2 Minute Post HR 52     Interval Heart Rate? Yes       Interval Oxygen   Interval Oxygen? Yes     Baseline Oxygen Saturation % 88 %     1 Minute Oxygen Saturation % 89 %     1 Minute Liters of Oxygen 0 L  2 Minute Oxygen Saturation % 91 %     2 Minute Liters of Oxygen 0 L     3 Minute Oxygen Saturation % 86 %     3 Minute Liters of Oxygen 0 L     4 Minute Oxygen Saturation % 83 %     4 Minute Liters of Oxygen 0 L     5 Minute Oxygen Saturation % 84 %     5 Minute Liters of Oxygen 0 L     6 Minute Oxygen Saturation % 85 %     6 Minute Liters of Oxygen 0 L     2 Minute Post Oxygen Saturation % 94 %     2  Minute Post Liters of Oxygen 0 L             Oxygen Initial Assessment:  Oxygen Initial Assessment - 12/23/23 1112       Home Oxygen   Home Oxygen Device None    Sleep Oxygen Prescription None    Home Exercise Oxygen Prescription None    Home Resting Oxygen Prescription None      Initial 6 min Walk   Oxygen Used None      Program Oxygen Prescription   Program Oxygen Prescription None      Intervention   Short Term Goals To learn and understand importance of monitoring SPO2 with pulse oximeter and demonstrate accurate use of the pulse oximeter.;To learn and demonstrate proper pursed lip breathing techniques or other breathing techniques. ;To learn and understand importance of maintaining oxygen saturations>88%;To learn and demonstrate proper use of respiratory medications;To learn and exhibit compliance with exercise, home and travel O2 prescription    Long  Term Goals Exhibits compliance with exercise, home  and travel O2 prescription;Maintenance of O2 saturations>88%;Compliance with respiratory medication;Demonstrates proper use of MDI's;Exhibits proper breathing techniques, such as pursed lip breathing or other method taught during program session;Verbalizes importance of monitoring SPO2 with pulse oximeter and return demonstration             Oxygen Re-Evaluation:  Oxygen Re-Evaluation     Row Name 12/30/23 1408 01/21/24 1128           Program Oxygen Prescription   Program Oxygen Prescription -- None        Home Oxygen   Home Oxygen Device -- None      Sleep Oxygen Prescription -- None      Home Exercise Oxygen Prescription -- None      Home Resting Oxygen Prescription -- None      Compliance with Home Oxygen Use -- Yes        Goals/Expected Outcomes   Short Term Goals -- To learn and demonstrate proper pursed lip breathing techniques or other breathing techniques.       Long  Term Goals -- Exhibits proper breathing techniques, such as pursed lip breathing or  other method taught during program session      Comments Reviewed PLB technique with pt.  Talked about how it works and it's importance in maintaining their exercise saturations. Reviewed PLB technique with pt.  Talked about how it works and it's importance in maintaining their exercise saturations.      Short: Become more profiecient at using PLB.   Long: Become independent at using PLB.      Goals/Expected Outcomes Short: Become more profiecient at using PLB.   Long: Become independent at using PLB. Short: Become more profiecient at using PLB.   Long: Become  independent at using PLB.               Oxygen Discharge (Final Oxygen Re-Evaluation):  Oxygen Re-Evaluation - 01/21/24 1128       Program Oxygen Prescription   Program Oxygen Prescription None      Home Oxygen   Home Oxygen Device None    Sleep Oxygen Prescription None    Home Exercise Oxygen Prescription None    Home Resting Oxygen Prescription None    Compliance with Home Oxygen Use Yes      Goals/Expected Outcomes   Short Term Goals To learn and demonstrate proper pursed lip breathing techniques or other breathing techniques.     Long  Term Goals Exhibits proper breathing techniques, such as pursed lip breathing or other method taught during program session    Comments Reviewed PLB technique with pt.  Talked about how it works and it's importance in maintaining their exercise saturations.      Short: Become more profiecient at using PLB.   Long: Become independent at using PLB.    Goals/Expected Outcomes Short: Become more profiecient at using PLB.   Long: Become independent at using PLB.             Initial Exercise Prescription:  Initial Exercise Prescription - 12/26/23 1600       Date of Initial Exercise RX and Referring Provider   Date 12/26/23    Referring Provider Vergia Glasgow, MD      Oxygen   Maintain Oxygen Saturation 88% or higher      Recumbant Bike   Level 2    RPM 50    Watts 15    Minutes  15    METs 2.59      NuStep   Level 2    SPM 80    Minutes 15    METs 2.59      T5 Nustep   Level 2    SPM 80    Minutes 15    METs 2.59      Biostep-RELP   Level 2    SPM 50    Minutes 15    METs 2.59      Track   Laps 20    Minutes 15    METs 2.09      Prescription Details   Frequency (times per week) 2    Duration Progress to 30 minutes of continuous aerobic without signs/symptoms of physical distress      Intensity   THRR 40-80% of Max Heartrate 95-129    Ratings of Perceived Exertion 11-13    Perceived Dyspnea 0-4      Progression   Progression Continue to progress workloads to maintain intensity without signs/symptoms of physical distress.      Resistance Training   Training Prescription Yes    Weight 5 lb    Reps 10-15             Perform Capillary Blood Glucose checks as needed.  Exercise Prescription Changes:   Exercise Prescription Changes     Row Name 12/26/23 1600 01/16/24 1800 01/29/24 0800 02/12/24 1400 02/27/24 0900     Response to Exercise   Blood Pressure (Admit) 126/66 144/66 114/56 128/64 136/64   Blood Pressure (Exercise) 160/60 142/60 142/66 126/72 148/66   Blood Pressure (Exit) 136/70 124/68 106/60 124/62 112/68   Heart Rate (Admit) 62 bpm 62 bpm 72 bpm 74 bpm 78 bpm   Heart Rate (Exercise) 93 bpm 94 bpm 104 bpm  89 bpm 101 bpm   Heart Rate (Exit) 52 bpm 63 bpm 72 bpm 74 bpm 83 bpm   Oxygen Saturation (Admit) 88 % 92 % 91 % 94 % 91 %   Oxygen Saturation (Exercise) 83 % 89 % 85 % 91 % 88 %   Oxygen Saturation (Exit) 94 % 90 % 92 % 93 % 92 %   Rating of Perceived Exertion (Exercise) 17 15 12 11 15    Perceived Dyspnea (Exercise) 3 3 2 1  0   Symptoms Bilateral hip pain 4/10 none none none none   Comments results first 2 weeks of exercise -- -- --   Duration Progress to 30 minutes of  aerobic without signs/symptoms of physical distress Progress to 30 minutes of  aerobic without signs/symptoms of physical distress Continue  with 30 min of aerobic exercise without signs/symptoms of physical distress. Continue with 30 min of aerobic exercise without signs/symptoms of physical distress. Continue with 30 min of aerobic exercise without signs/symptoms of physical distress.   Intensity THRR New THRR New THRR unchanged THRR unchanged THRR unchanged     Progression   Progression Continue to progress workloads to maintain intensity without signs/symptoms of physical distress. Continue to progress workloads to maintain intensity without signs/symptoms of physical distress. Continue to progress workloads to maintain intensity without signs/symptoms of physical distress. Continue to progress workloads to maintain intensity without signs/symptoms of physical distress. Continue to progress workloads to maintain intensity without signs/symptoms of physical distress.   Average METs 2.59 2.15 3.45 2.87 3.79     Resistance Training   Training Prescription -- Yes Yes Yes Yes   Weight -- 5 lb 5 lb 5 lb 5 lb   Reps -- 10-15 10-15 10-15 10-15     Interval Training   Interval Training -- No No No No     NuStep   Level -- -- 2 -- 5   Minutes -- -- 15 -- 15   METs -- -- 5.2 -- 4.8     T5 Nustep   Level -- 2 2 3  --   Minutes -- 15 15 15  --   METs -- 2.1 2 2.5 --     Biostep-RELP   Level -- 2 -- -- 4   Minutes -- 15 -- -- 15   METs -- 2 -- -- --     Track   Laps -- 26 42 42 42   Minutes -- 15 15 15 15    METs -- 2.41 3.28 3.25 3.28     Oxygen   Maintain Oxygen Saturation -- 88% or higher 88% or higher 88% or higher 88% or higher            Exercise Comments:   Exercise Comments     Row Name 12/30/23 1406 01/21/24 1127         Exercise Comments First full day of exercise!  Patient was oriented to gym and equipment including functions, settings, policies, and procedures.  Patient's individual exercise prescription and treatment plan were reviewed.  All starting workloads were established based on the results of  the 6 minute walk test done at initial orientation visit.  The plan for exercise progression was also introduced and progression will be customized based on patient's performance and goals. First full day of exercise!  Patient was oriented to gym and equipment including functions, settings, policies, and procedures.  Patient's individual exercise prescription and treatment plan were reviewed.  All starting workloads were established based on the  results of the 6 minute walk test done at initial orientation visit.  The plan for exercise progression was also introduced and progression will be customized based on patient's performance and goals.               Exercise Goals and Review:   Exercise Goals     Row Name 12/26/23 1614             Exercise Goals   Increase Physical Activity Yes       Intervention Provide advice, education, support and counseling about physical activity/exercise needs.;Develop an individualized exercise prescription for aerobic and resistive training based on initial evaluation findings, risk stratification, comorbidities and participant's personal goals.       Expected Outcomes Short Term: Attend rehab on a regular basis to increase amount of physical activity.;Long Term: Add in home exercise to make exercise part of routine and to increase amount of physical activity.;Long Term: Exercising regularly at least 3-5 days a week.       Increase Strength and Stamina Yes       Intervention Provide advice, education, support and counseling about physical activity/exercise needs.;Develop an individualized exercise prescription for aerobic and resistive training based on initial evaluation findings, risk stratification, comorbidities and participant's personal goals.       Expected Outcomes Short Term: Increase workloads from initial exercise prescription for resistance, speed, and METs.;Short Term: Perform resistance training exercises routinely during rehab and add in  resistance training at home;Long Term: Improve cardiorespiratory fitness, muscular endurance and strength as measured by increased METs and functional capacity ( )       Able to understand and use rate of perceived exertion (RPE) scale Yes       Intervention Provide education and explanation on how to use RPE scale       Expected Outcomes Short Term: Able to use RPE daily in rehab to express subjective intensity level;Long Term:  Able to use RPE to guide intensity level when exercising independently       Able to understand and use Dyspnea scale Yes       Intervention Provide education and explanation on how to use Dyspnea scale       Expected Outcomes Short Term: Able to use Dyspnea scale daily in rehab to express subjective sense of shortness of breath during exertion;Long Term: Able to use Dyspnea scale to guide intensity level when exercising independently       Knowledge and understanding of Target Heart Rate Range (THRR) Yes       Intervention Provide education and explanation of THRR including how the numbers were predicted and where they are located for reference       Expected Outcomes Short Term: Able to state/look up THRR;Short Term: Able to use daily as guideline for intensity in rehab;Long Term: Able to use THRR to govern intensity when exercising independently       Able to check pulse independently Yes       Intervention Provide education and demonstration on how to check pulse in carotid and radial arteries.;Review the importance of being able to check your own pulse for safety during independent exercise       Expected Outcomes Short Term: Able to explain why pulse checking is important during independent exercise;Long Term: Able to check pulse independently and accurately       Understanding of Exercise Prescription Yes       Intervention Provide education, explanation, and written materials on patient's individual exercise prescription  Expected Outcomes Short Term: Able to  explain program exercise prescription;Long Term: Able to explain home exercise prescription to exercise independently                Exercise Goals Re-Evaluation :  Exercise Goals Re-Evaluation     Row Name 12/30/23 1407 01/16/24 1823 01/21/24 1127 01/29/24 0805 02/12/24 1435     Exercise Goal Re-Evaluation   Exercise Goals Review Able to understand and use rate of perceived exertion (RPE) scale;Increase Physical Activity;Knowledge and understanding of Target Heart Rate Range (THRR);Understanding of Exercise Prescription;Increase Strength and Stamina;Able to check pulse independently Increase Physical Activity;Increase Strength and Stamina;Understanding of Exercise Prescription Able to understand and use rate of perceived exertion (RPE) scale;Able to understand and use Dyspnea scale;Knowledge and understanding of Target Heart Rate Range (THRR);Understanding of Exercise Prescription Increase Physical Activity;Understanding of Exercise Prescription;Increase Strength and Stamina Increase Physical Activity;Understanding of Exercise Prescription;Increase Strength and Stamina   Comments Reviewed RPE and dyspnea scale, THR and program prescription with pt today.  Pt voiced understanding and was given a copy of goals to take home. Wealtha is off to a good start in the program. She was able to use all of her prescribed exercise machines/ modalities. She was also able to increase her laps on the track from 20 to 26 in 15 minutes. We will continue to monitor her progress in the program. Reviewed RPE and dyspnea scale, THR and program prescription with pt today.  Pt voiced understanding and was given a copy of goals to take home.       Short: Use RPE daily to regulate intensity.  Long: Follow program prescription in THR. Royann is doing well in the program. She has increased her laps on the track to 42 laps from 26 laps. She has maintained level 2 on the T4 and T5 nusteps. We will continue to monitor her progress  in the program. Meradith continues to do well in the program. She was recently able to increase from level 2 to 3 on the T5 nustep. She was also able to maintain 42 laps on the track in 15 minutes. We will continue to monitor her progress in the program.   Expected Outcomes Short: Use RPE daily to regulate intensity.  Long: Follow program prescription in THR. Short: Continue to follow exercise prescription. Long: Continue exercise to improve strength and stamina. Short: Use RPE daily to regulate intensity. Long: Follow program prescription in THR. Short: Try level 3 on the T4 and T5 nusteps. Long: Continue exercise to improve strength and stamina. Short: Push for more laps on the track. Long: Conitinue exercise to improve strength and stamina.    Row Name 02/27/24 0948             Exercise Goal Re-Evaluation   Exercise Goals Review Increase Physical Activity;Understanding of Exercise Prescription;Increase Strength and Stamina       Comments Wynee continues to do well in the program. She increased to level 5 on the T4 nustep and level 4 on the biostep. She also maintained 42 laps on the track. We will continue to monitor her progress in the program.       Expected Outcomes Short: Continue to push for more laps on the track. Long: Conitinue exercise to improve strength and stamina.                Discharge Exercise Prescription (Final Exercise Prescription Changes):  Exercise Prescription Changes - 02/27/24 0900       Response to Exercise  Blood Pressure (Admit) 136/64    Blood Pressure (Exercise) 148/66    Blood Pressure (Exit) 112/68    Heart Rate (Admit) 78 bpm    Heart Rate (Exercise) 101 bpm    Heart Rate (Exit) 83 bpm    Oxygen Saturation (Admit) 91 %    Oxygen Saturation (Exercise) 88 %    Oxygen Saturation (Exit) 92 %    Rating of Perceived Exertion (Exercise) 15    Perceived Dyspnea (Exercise) 0    Symptoms none    Duration Continue with 30 min of aerobic exercise without  signs/symptoms of physical distress.    Intensity THRR unchanged      Progression   Progression Continue to progress workloads to maintain intensity without signs/symptoms of physical distress.    Average METs 3.79      Resistance Training   Training Prescription Yes    Weight 5 lb    Reps 10-15      Interval Training   Interval Training No      NuStep   Level 5    Minutes 15    METs 4.8      Biostep-RELP   Level 4    Minutes 15      Track   Laps 42    Minutes 15    METs 3.28      Oxygen   Maintain Oxygen Saturation 88% or higher             Nutrition:  Target Goals: Understanding of nutrition guidelines, daily intake of sodium 1500mg , cholesterol 200mg , calories 30% from fat and 7% or less from saturated fats, daily to have 5 or more servings of fruits and vegetables.  Education: All About Nutrition: -Group instruction provided by verbal, written material, interactive activities, discussions, models, and posters to present general guidelines for heart healthy nutrition including fat, fiber, MyPlate, the role of sodium in heart healthy nutrition, utilization of the nutrition label, and utilization of this knowledge for meal planning. Follow up email sent as well. Written material given at graduation.   Biometrics:  Pre Biometrics - 12/26/23 1614       Pre Biometrics   Height 5' 5.7" (1.669 m)    Weight 177 lb 3.2 oz (80.4 kg)    Waist Circumference 45 inches    Hip Circumference 42 inches    Waist to Hip Ratio 1.07 %    BMI (Calculated) 28.85    Single Leg Stand 30 seconds              Nutrition Therapy Plan and Nutrition Goals:  Nutrition Therapy & Goals - 12/26/23 1617       Nutrition Therapy   RD appointment deferred Yes      Intervention Plan   Intervention Prescribe, educate and counsel regarding individualized specific dietary modifications aiming towards targeted core components such as weight, hypertension, lipid management, diabetes,  heart failure and other comorbidities.    Expected Outcomes Short Term Goal: Understand basic principles of dietary content, such as calories, fat, sodium, cholesterol and nutrients.             Nutrition Assessments:  MEDIFICTS Score Key: >=70 Need to make dietary changes  40-70 Heart Healthy Diet <= 40 Therapeutic Level Cholesterol Diet   Picture Your Plate Scores: <40 Unhealthy dietary pattern with much room for improvement. 41-50 Dietary pattern unlikely to meet recommendations for good health and room for improvement. 51-60 More healthful dietary pattern, with some room for improvement.  >60 Healthy  dietary pattern, although there may be some specific behaviors that could be improved.   Nutrition Goals Re-Evaluation:   Nutrition Goals Discharge (Final Nutrition Goals Re-Evaluation):   Psychosocial: Target Goals: Acknowledge presence or absence of significant depression and/or stress, maximize coping skills, provide positive support system. Participant is able to verbalize types and ability to use techniques and skills needed for reducing stress and depression.   Education: Stress, Anxiety, and Depression - Group verbal and visual presentation to define topics covered.  Reviews how body is impacted by stress, anxiety, and depression.  Also discusses healthy ways to reduce stress and to treat/manage anxiety and depression.  Written material given at graduation.   Education: Sleep Hygiene -Provides group verbal and written instruction about how sleep can affect your health.  Define sleep hygiene, discuss sleep cycles and impact of sleep habits. Review good sleep hygiene tips.    Initial Review & Psychosocial Screening:  Initial Psych Review & Screening - 12/23/23 1114       Initial Review   Current issues with Current Psychotropic Meds;Current Depression;History of Depression      Family Dynamics   Good Support System? Yes    Comments She can look to her son and  daughters for support. 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      Barriers   Psychosocial barriers to participate in program The patient should benefit from training in stress management and relaxation.      Screening Interventions   Interventions Encouraged to exercise;Program counselor consult;To provide support and resources with identified psychosocial needs;Provide feedback about the scores to participant    Expected Outcomes Short Term goal: Utilizing psychosocial counselor, staff and physician to assist with identification of specific Stressors or current issues interfering with healing process. Setting desired goal for each stressor or current issue identified.;Long Term Goal: Stressors or current issues are controlled or eliminated.;Short Term goal: Identification and review with participant of any Quality of Life or Depression concerns found by scoring the questionnaire.;Long Term goal: The participant improves quality of Life and PHQ9 Scores as seen by post scores and/or verbalization of changes             Quality of Life Scores:  Scores of 19 and below usually indicate a poorer quality of life in these areas.  A difference of  2-3 points is a clinically meaningful difference.  A difference of 2-3 points in the total score of the Quality of Life Index has been associated with significant improvement in overall quality of life, self-image, physical symptoms, and general health in studies assessing change in quality of life.  PHQ-9: Review Flowsheet  More data exists      12/26/2023 02/07/2023 01/05/2022 09/11/2021 03/22/2021  Depression screen PHQ 2/9  Decreased Interest 1 0 0 0 0  Down, Depressed, Hopeless 1 1 0 0 0  PHQ - 2 Score 2 1 0 0 0  Altered sleeping 3 1 - 1 -  Tired, decreased energy 1 1 - 0 -  Change in appetite 0 0 - 0 -  Feeling bad or failure about yourself  2 0 - 0 -  Trouble concentrating 0 1 - 0 -  Moving slowly or  fidgety/restless 1 0 - 0 -  Suicidal thoughts 0 0 - 0 -  PHQ-9 Score 9 4 - 1 -  Difficult doing work/chores Somewhat difficult Not difficult at all - Somewhat difficult -   Interpretation of Total Score  Total Score Depression Severity:  1-4 = Minimal depression, 5-9 = Mild depression, 10-14 = Moderate depression, 15-19 = Moderately severe depression, 20-27 = Severe depression   Psychosocial Evaluation and Intervention:  Psychosocial Evaluation - 12/23/23 1116       Psychosocial Evaluation & Interventions   Interventions Encouraged to exercise with the program and follow exercise prescription;Relaxation education    Comments She can look to her son and daughters for support. 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    Expected Outcomes Short: Attend [facility] regularly to improve shortness of breath with ADL's. Long: maintain independence with ADL's    Continue Psychosocial Services  Follow up required by staff             Psychosocial Re-Evaluation:   Psychosocial Discharge (Final Psychosocial Re-Evaluation):   Education: Education Goals: Education classes will be provided on a weekly basis, covering required topics. Participant will state understanding/return demonstration of topics presented.  Learning Barriers/Preferences:  Learning Barriers/Preferences - 12/23/23 1113       Learning Barriers/Preferences   Learning Barriers None    Learning Preferences None             General Pulmonary Education Topics:  Infection Prevention: - Provides verbal and written material to individual with discussion of infection control including proper hand washing and proper equipment cleaning during exercise session. Flowsheet Row Pulmonary Rehab from 12/26/2023 in [facility]  Date 12/26/23  Educator MB  Instruction Review Code 1- Verbalizes Understanding       Falls Prevention: - Provides verbal and written  material to individual with discussion of falls prevention and safety. Flowsheet Row Pulmonary Rehab from 12/26/2023 in [facility] Card[facility] and Pulmonary Re[facility]  Date 12/26/23  Educator MB  Instruction Review Code 1- Verbalizes Understanding       Chronic Lung Disease Review: - Group verbal instruction with posters, models, PowerPoint presentations and videos,  to review new updates, new respiratory medications, new advancements in procedures and treatments. Providing information on websites and "800" numbers for continued self-education. Includes information about supplement oxygen, available portable oxygen systems, continuous and intermittent flow rates, oxygen safety, concentrators, and Medicare reimbursement for oxygen. Explanation of Pulmonary Drugs, including class, frequency, complications, importance of spacers, rinsing mouth after steroid MDI's, and proper cleaning methods for nebulizers. Review of basic lung anatomy and physiology related to function, structure, and complications of lung disease. Review of risk factors. Discussion about methods for diagnosing sleep apnea and types of masks and machines for OSA. Includes a review of the use of types of environmental controls: home humidity, furnaces, filters, dust mite/pet prevention, HEPA vacuums. Discussion about weather changes, air quality and the benefits of nasal washing. Instruction on Warning signs, infection symptoms, calling MD promptly, preventive modes, and value of vaccinations. Review of effective airway clearance, coughing and/or vibration techniques. Emphasizing that all should Create an Action Plan. Written material given at graduation.   AED/CPR: - Group verbal and written instruction with the use of models to demonstrate the basic use of the AED with the basic ABC's of resuscitation.    Anatomy and Cardiac Procedures: - Group verbal and visual presentation and models provide information about basic cardiac anatomy and function.  Reviews the testing methods done to diagnose heart disease and the outcomes of the test results. Describes the treatment choices: Medical Management, Angioplasty, or Coronary Bypass Surgery for treating various heart conditions including Myocardial Infarction, Angina, Valve Disease, and Cardiac Arrhythmias.  Written material given at graduation.   Medication Safety: - Group verbal and visual instruction to review commonly prescribed medications for heart and lung disease. Reviews the medication, class of the drug, and side effects. Includes the steps to properly store meds and maintain the prescription regimen.  Written material given at graduation.   Other: -Provides group and verbal instruction on various topics (see comments)   Knowledge Questionnaire Score:    Core Components/Risk Factors/Patient Goals at Admission:  Personal Goals and Risk Factors at Admission - 12/26/23 1617       Core Components/Risk Factors/Patient Goals on Admission    Weight Management Yes;Weight Loss    Intervention Weight Management: Develop a combined nutrition and exercise program designed to reach desired caloric intake, while maintaining appropriate intake of nutrient and fiber, sodium and fats, and appropriate energy expenditure required for the weight goal.;Weight Management: Provide education and appropriate resources to help participant work on and attain dietary goals.;Weight Management/Obesity: Establish reasonable short term and long term weight goals.    Admit Weight 177 lb 3.2 oz (80.4 kg)    Goal Weight: Short Term 160 lb (72.6 kg)    Goal Weight: Long Term 150 lb (68 kg)    Expected Outcomes Short Term: Continue to assess and modify interventions until short term weight is achieved;Weight Maintenance: Understanding of the daily nutrition guidelines, which includes 25-35% calories from fat, 7% or less cal from saturated fats, less than 200mg  cholesterol, less than 1.5gm of sodium, & 5 or more  servings of fruits and vegetables daily;Weight Loss: Understanding of general recommendations for a balanced deficit meal plan, which promotes 1-2 lb weight loss per week and includes a negative energy balance of [phone] kcal/d;Understanding recommendations for meals to include 15-35% energy as protein, 25-35% energy from fat, 35-60% energy from carbohydrates, less than 200mg  of dietary cholesterol, 20-35 gm of total fiber daily;Understanding of distribution of calorie intake throughout the day with the consumption of 4-5 meals/snacks    Improve shortness of breath with ADL's Yes    Intervention Provide education, individualized exercise plan and daily activity instruction to help decrease symptoms of SOB with activities of daily living.    Expected Outcomes Short Term: Improve cardiorespiratory fitness to achieve a reduction of symptoms when performing ADLs;Long Term: Be able to perform more ADLs without symptoms or delay the onset of symptoms    Hypertension Yes    Intervention Provide education on lifestyle modifcations including regular physical activity/exercise, weight management, moderate sodium restriction and increased consumption of fresh fruit, vegetables, and low fat dairy, alcohol moderation, and smoking cessation.;Monitor prescription use compliance.    Expected Outcomes Short Term: Continued assessment and intervention until BP is < 140/9mm HG in hypertensive participants. < 130/95mm HG in hypertensive participants with diabetes, heart failure or chronic kidney disease.;Long Term: Maintenance of blood pressure at goal levels.    Lipids Yes    Intervention Provide education and support for participant on nutrition & aerobic/resistive exercise along with prescribed medications to achieve LDL 70mg , HDL >40mg .    Expected Outcomes Short Term: Participant states understanding of desired cholesterol values and is compliant with medications prescribed. Participant is following exercise  prescription and nutrition guidelines.;Long Term: Cholesterol controlled with medications as prescribed, with individualized exercise RX and with personalized nutrition plan. Value goals: LDL < 70mg , HDL > 40 mg.             Education:Diabetes - Individual verbal and written instruction to review signs/symptoms of diabetes, desired ranges of  glucose level fasting, after meals and with exercise. Acknowledge that pre and post exercise glucose checks will be done for 3 sessions at entry of program.   Know Your Numbers and Heart Failure: - Group verbal and visual instruction to discuss disease risk factors for cardiac and pulmonary disease and treatment options.  Reviews associated critical values for Overweight/Obesity, Hypertension, Cholesterol, and Diabetes.  Discusses basics of heart failure: signs/symptoms and treatments.  Introduces Heart Failure Zone chart for action plan for heart failure.  Written material given at graduation.   Core Components/Risk Factors/Patient Goals Review:    Core Components/Risk Factors/Patient Goals at Discharge (Final Review):    ITP Comments:  ITP Comments     Row Name 12/23/23 1112 12/26/23 1606 12/30/23 1405 01/08/24 0818 01/21/24 1127   ITP Comments Virtual Visit completed. Patient informed on EP and RD appointment and 6 Minute walk test. Patient also informed of patient health questionnaires on My Chart. Patient Verbalizes understanding. Visit diagnosis can be found in [facility] 10/18/2023. Completed and gym orientation. Initial ITP created and sent for review to Dr. Faud Aleskerov, Medical Director. First full day of exercise!  Patient was oriented to gym and equipment including functions, settings, policies, and procedures.  Patient's individual exercise prescription and treatment plan were reviewed.  All starting workloads were established based on the results of the 6 minute walk test done at initial orientation visit.  The plan for exercise  progression was also introduced and progression will be customized based on patient's performance and goals. 30 Day review completed. Medical Director ITP review done, changes made as directed, and signed approval by Medical Director. New to program. wrong chart  not her first day  SB 01/21/2024    Row Name 02/05/24 0940 03/04/24 0855 03/11/24 1003       ITP Comments 30 Day review completed. Medical Director ITP review done, changes made as directed, and signed approval by Medical Director. 30 Day review completed. Medical Director ITP review done, changes made as directed, and signed approval by Medical Director. Lalani has not attended since 4/17. Staff have left messages and sent her a discharge letter with no response. Discharging at this time from program. She has completed 10 of 36 sessions.              Comments: Early Discharge ITP

## 2024-03-12 ENCOUNTER — Encounter

## 2024-03-17 ENCOUNTER — Other Ambulatory Visit: Payer: Self-pay | Admitting: Family Medicine

## 2024-03-17 ENCOUNTER — Encounter

## 2024-03-17 DIAGNOSIS — I25118 Atherosclerotic heart disease of native coronary artery with other forms of angina pectoris: Secondary | ICD-10-CM

## 2024-03-19 ENCOUNTER — Encounter

## 2024-03-19 NOTE — Telephone Encounter (Signed)
 Requested medications are due for refill today.  yes  Requested medications are on the active medications list.  yes  Last refill. 01/24/2024 2mL 2 rf  Future visit scheduled.   no  Notes to clinic.  Unsure if pt is to remain on this dose.    Requested Prescriptions  Pending Prescriptions Disp Refills   WEGOVY  0.5 MG/0.5ML SOAJ [Pharmacy Med Name: WEGOVY  0.5 MG/0.5ML SUBQ SOLN ML] 2 mL 2    Sig: INJECT 0.5MG  SUBCUTANEOUSLY ONCE A WEEK     Endocrinology:  Diabetes - GLP-1 Receptor Agonists - semaglutide  Failed - 03/19/2024 10:41 AM      Failed - HBA1C in normal range and within 180 days    Hgb A1c MFr Bld  Date Value Ref Range Status  09/11/2023 5.8 (H) <5.7 % of total Hgb Final    Comment:    For someone without known diabetes, a hemoglobin  A1c value between 5.7% and 6.4% is consistent with prediabetes and should be confirmed with a  follow-up test. . For someone with known diabetes, a value <7% indicates that their diabetes is well controlled. A1c targets should be individualized based on duration of diabetes, age, comorbid conditions, and other considerations. . This assay result is consistent with an increased risk of diabetes. . Currently, no consensus exists regarding use of hemoglobin A1c for diagnosis of diabetes for children. .          Failed - Cr in normal range and within 360 days    Creat  Date Value Ref Range Status  09/11/2023 1.03 (H) 0.60 - 1.00 mg/dL Final         Passed - Valid encounter within last 6 months    Recent Outpatient Visits           3 months ago Atrophic vaginitis   [facility], [place], [place]

## 2024-03-24 ENCOUNTER — Encounter

## 2024-03-26 ENCOUNTER — Encounter

## 2024-03-31 ENCOUNTER — Encounter

## 2024-04-01 ENCOUNTER — Ambulatory Visit (INDEPENDENT_AMBULATORY_CARE_PROVIDER_SITE_OTHER): Admitting: Family Medicine

## 2024-04-01 ENCOUNTER — Other Ambulatory Visit: Payer: Self-pay | Admitting: Family Medicine

## 2024-04-01 ENCOUNTER — Encounter: Payer: Self-pay | Admitting: Family Medicine

## 2024-04-01 VITALS — BP 146/60 | HR 73 | Ht 65.7 in | Wt 165.2 lb

## 2024-04-01 DIAGNOSIS — I25118 Atherosclerotic heart disease of native coronary artery with other forms of angina pectoris: Secondary | ICD-10-CM | POA: Diagnosis not present

## 2024-04-01 DIAGNOSIS — N952 Postmenopausal atrophic vaginitis: Secondary | ICD-10-CM | POA: Diagnosis not present

## 2024-04-01 DIAGNOSIS — Z0000 Encounter for general adult medical examination without abnormal findings: Secondary | ICD-10-CM

## 2024-04-01 DIAGNOSIS — I10 Essential (primary) hypertension: Secondary | ICD-10-CM

## 2024-04-01 DIAGNOSIS — E663 Overweight: Secondary | ICD-10-CM

## 2024-04-01 DIAGNOSIS — R7309 Other abnormal glucose: Secondary | ICD-10-CM

## 2024-04-01 DIAGNOSIS — E782 Mixed hyperlipidemia: Secondary | ICD-10-CM

## 2024-04-01 DIAGNOSIS — E559 Vitamin D deficiency, unspecified: Secondary | ICD-10-CM

## 2024-04-01 MED ORDER — WEGOVY 1 MG/0.5ML ~~LOC~~ SOAJ: 1.0000 mg | SUBCUTANEOUS | 4 refills | Status: DC

## 2024-04-01 NOTE — Patient Instructions (Addendum)
 Thank you for coming to the office today.  Remember to pick up Estradiol  vaginal cream - use THREE TIMES PER WEEK - Pick up refills at pharmacy  Dose increase Wegovy  from 0.5mg  weekly up to 1mg  weekly, new order sent to [facility], finish current dose first.  DUE for FASTING BLOOD WORK (no food or drink after midnight before the lab appointment, only water or coffee without cream/sugar on the morning of)  SCHEDULE "Lab Only" visit in the morning at the clinic for lab draw in 5 MONTHS   - Make sure Lab Only appointment is at about 1 week before your next appointment, so that results will be available  For Lab Results, once available within 2-3 days of blood draw, you can can log in to MyChart online to view your results and a brief explanation. Also, we can discuss results at next follow-up visit.   Please schedule a Follow-up Appointment to: Return in about 5 months (around 09/13/2024) for 5 month fasting lab then 1 week later Annual Physical.  If you have any other questions or concerns, please feel free to call the office or send a message through MyChart. You may also schedule an earlier appointment if necessary.  Additionally, you may be receiving a survey about your experience at our office within a few days to 1 week by e-mail or mail. We value your feedback.  Domingo Friend, DO [facility], [place]

## 2024-04-01 NOTE — Progress Notes (Signed)
 Subjective:    Patient ID: Kristin Parker, female    DOB: October 29, 1951, 73 y.o.   MRN: [id number]  Kristin Parker is a 73 y.o. female presenting on 04/01/2024 for Coronary Artery Disease and Hypertension   HPI  Discussed the use of AI scribe software for clinical note transcription with the patient, who gave verbal consent to proceed.  History of Present Illness   Kristin Parker is a 73 year old female who presents for a follow-up on urinary and vaginal symptoms, headaches, and weight management.  Atrophic Vaginitis Her urinary and vaginal symptoms have improved with the use of topical estradiol  gel, which she applied nightly for two weeks and then reduced to three times a week. She has not picked up refills yet. - No further dry / itching irritation and urinary symptoms.  Episodic Migraine / Post Concussion Her headaches, previously a concern and thought to be related to a past concussion, have resolved. She has been using a dissolvable headache medication but no longer feels the need for it, although she keeps some on hand in case of future headaches. - Takes Rizatriptan  AS NEEDED but no longer needs  Coronary Artery Disease Followed by Cardiology History of Heart Disease on prior Coronary Scan 08/2023 per Cardiology showing Mild non obstructive CAD. On Aspirin , Repatha , Wegovy  She has experienced a weight loss of approximately 12 pounds over the past three months, attributed to the use of Wegovy , which she takes weekly at a 0.5 mg dose. She has two doses remaining. The weight loss has made breathing easier and her abdominal area less tight.  Insomnia She experiences difficulty sleeping and uses zolpidem  occasionally to help with this issue, but does not take it regularly.  CHRONIC HTN: Reports elevated BP She was stressed from misplacing her keys prior to the visit and took her blood pressure medication about 30 minutes before the visit.  Current Meds - Amlodipine  10mg  daily,  Losartan  100mg  daily   Reports good compliance, took meds today. Tolerating well, w/o complaints. Denies CP, dyspnea, HA, edema, dizziness / lightheadedness    Health Maintenance: Decline Mammogram, Pneumonia vaccine     04/01/2024   10:48 AM 12/26/2023    4:18 PM 02/07/2023    3:04 PM  Depression screen PHQ 2/9  Decreased Interest 0 1 0  Down, Depressed, Hopeless 0 1 1  PHQ - 2 Score 0 2 1  Altered sleeping  3 1  Tired, decreased energy  1 1  Change in appetite  0 0  Feeling bad or failure about yourself   2 0  Trouble concentrating  0 1  Moving slowly or fidgety/restless  1 0  Suicidal thoughts  0 0  PHQ-9 Score  9 4  Difficult doing work/chores  Somewhat difficult Not difficult at all       04/01/2024   10:48 AM 09/11/2021    2:19 PM 03/22/2021    9:34 AM 03/08/2021    1:38 PM  GAD 7 : Generalized Anxiety Score  Nervous, Anxious, on Edge 0 1 3 1   Control/stop worrying 0 1 3 0  Worry too much - different things 0 1 3 1   Trouble relaxing 0 1 3 0  Restless 0 1 0 0  Easily annoyed or irritable 0 1 3 1   Afraid - awful might happen 0 1 3 1   Total GAD 7 Score 0 7 18 4   Anxiety Difficulty  Not difficult at all Not difficult at all Not difficult at  all    Social History   Tobacco Use   Smoking status: Former    Current packs/day: 0.00    Average packs/day: 0.5 packs/day for 30.0 years (15.0 ttl pk-yrs)    Types: Cigarettes    Start date: 10/30/1990    Quit date: 10/30/2020    Years since quitting: 3.4   Smokeless tobacco: Never   Tobacco comments:    Quit Jan 2022  Vaping Use   Vaping status: Former  Substance Use Topics   Alcohol use: No    Alcohol/week: 0.0 standard drinks of alcohol   Drug use: No    Review of Systems Per HPI unless specifically indicated above     Objective:     BP (!) 146/60 (BP Location: Left Arm, Cuff Size: Normal)   Pulse 73   Ht 5' 5.7" (1.669 m)   Wt 165 lb 4 oz (75 kg)   SpO2 95%   BMI 26.92 kg/m   Wt Readings from Last 3  Encounters:  04/01/24 165 lb 4 oz (75 kg)  12/26/23 177 lb 3.2 oz (80.4 kg)  12/17/23 174 lb (78.9 kg)    Physical Exam Vitals and nursing note reviewed.  Constitutional:      General: She is not in acute distress.    Appearance: Normal appearance. She is well-developed. She is not diaphoretic.     Comments: Well-appearing, comfortable, cooperative  HENT:     Head: Normocephalic and atraumatic.  Eyes:     General:        Right eye: No discharge.        Left eye: No discharge.     Conjunctiva/sclera: Conjunctivae normal.  Cardiovascular:     Rate and Rhythm: Normal rate.  Pulmonary:     Effort: Pulmonary effort is normal.  Skin:    General: Skin is warm and dry.     Findings: No erythema or rash.  Neurological:     Mental Status: She is alert and oriented to person, place, and time.  Psychiatric:        Mood and Affect: Mood normal.        Behavior: Behavior normal.        Thought Content: Thought content normal.     Comments: Well groomed, good eye contact, normal speech and thoughts     Results for orders placed or performed in visit on 12/16/23  POCT Urinalysis Dipstick   Collection Time: 12/17/23  1:56 PM  Result Value Ref Range   Color, UA yellow    Clarity, UA clear    Glucose, UA Negative Negative   Bilirubin, UA small    Ketones, UA negative    Spec Grav, UA 1.025 1.010 - 1.025   Blood, UA negative    pH, UA 5.0 5.0 - 8.0   Protein, UA Negative Negative   Urobilinogen, UA 0.2 0.2 or 1.0 E.U./dL   Nitrite, UA negative    Leukocytes, UA Negative Negative   Appearance     Odor positive       Assessment & Plan:   Problem List Items Addressed This Visit     Coronary artery disease of native artery of native heart with stable angina pectoris (HCC) - Primary   Relevant Medications   WEGOVY  1 MG/0.5ML SOAJ   Essential hypertension   Other Visit Diagnoses       Atrophic vaginitis         Overweight (BMI 25.0-29.9)  Atrophic  Vaginitis Urinary and Vaginal Symptoms Improved with topical estrogen vaginal cream. Estradiol  gel effective, reduced to three times a week. - Continue estradiol  gel application three times a week. - Ensure awareness of available refills for estradiol  gel.  Hypertension Initial elevated blood pressure due to stress, improved post-medication. She took med only 30 min prior to apt. - Monitor blood pressure regularly. - Reassess blood pressure at the next visit. Continue Amlodipine  10mg  daily, Losartan  100mg  daily  Coronary Artery Disease On medication management and followed by Cardiology - on Repatha , Aspirin , and now on Wegovy  for cardiovascular prevention and weight. Lost 12 pounds with Wegovy , considering dose increase for continued weight loss. - Increase Wegovy  dose from 0.5 mg to 1 mg weekly. - Provide extra refills to last until the next visit. - Monitor weight and adjust dose if necessary.  Episodic Migraine Headaches Initially onset related to concussion, now headaches resolved. Symptoms resolved, no longer requires regular medication. - Discontinue regular refills of headache medication. Rizatriptan  ODT AS NEEDED  - Keep existing headache medication on hand for future use if needed.  Insomnia Difficulty sleeping, uses zolpidem  occasionally. - Continue zolpidem  6.25 CR as needed for sleep.  General Health Maintenance Declined mammogram and pneumonia vaccination, discussed importance. - Document decision to decline mammogram and pneumonia vaccination.  Follow-up To return in November for annual physical and lab work. - Schedule follow-up appointment for November 16th at 10 AM. - Arrange for lab work prior to the follow-up appointment.        No orders of the defined types were placed in this encounter.   Meds ordered this encounter  Medications   WEGOVY  1 MG/0.5ML SOAJ    Sig: Inject 1 mg into the skin once a week.    Dispense:  2 mL    Refill:  4    Follow  up plan: Return in about 5 months (around 09/13/2024) for 5 month fasting lab then 1 week later Annual Physical.  Future labs ordered for 09/02/24  Domingo Friend, DO [facility] 04/01/2024, 10:59 AM

## 2024-04-02 ENCOUNTER — Encounter

## 2024-04-07 ENCOUNTER — Encounter

## 2024-04-09 ENCOUNTER — Encounter

## 2024-04-14 ENCOUNTER — Encounter

## 2024-04-16 ENCOUNTER — Encounter

## 2024-04-16 ENCOUNTER — Other Ambulatory Visit: Payer: Self-pay

## 2024-04-16 ENCOUNTER — Observation Stay
Admission: EM | Admit: 2024-04-16 | Discharge: 2024-04-17 | Disposition: A | Discharge status: Home / Self Care | Attending: Internal Medicine | Admitting: Internal Medicine

## 2024-04-16 DIAGNOSIS — E785 Hyperlipidemia, unspecified: Secondary | ICD-10-CM | POA: Diagnosis present

## 2024-04-16 DIAGNOSIS — I25118 Atherosclerotic heart disease of native coronary artery with other forms of angina pectoris: Secondary | ICD-10-CM | POA: Diagnosis present

## 2024-04-16 DIAGNOSIS — K920 Hematemesis: Secondary | ICD-10-CM | POA: Diagnosis not present

## 2024-04-16 DIAGNOSIS — I2510 Atherosclerotic heart disease of native coronary artery without angina pectoris: Secondary | ICD-10-CM | POA: Diagnosis not present

## 2024-04-16 DIAGNOSIS — E663 Overweight: Secondary | ICD-10-CM | POA: Insufficient documentation

## 2024-04-16 DIAGNOSIS — Z1152 Encounter for screening for COVID-19: Secondary | ICD-10-CM | POA: Insufficient documentation

## 2024-04-16 DIAGNOSIS — Z6825 Body mass index (BMI) 25.0-25.9, adult: Secondary | ICD-10-CM | POA: Insufficient documentation

## 2024-04-16 DIAGNOSIS — R55 Syncope and collapse: Secondary | ICD-10-CM | POA: Diagnosis not present

## 2024-04-16 DIAGNOSIS — I10 Essential (primary) hypertension: Secondary | ICD-10-CM | POA: Diagnosis present

## 2024-04-16 DIAGNOSIS — J449 Chronic obstructive pulmonary disease, unspecified: Secondary | ICD-10-CM | POA: Diagnosis not present

## 2024-04-16 DIAGNOSIS — Z87891 Personal history of nicotine dependence: Secondary | ICD-10-CM | POA: Insufficient documentation

## 2024-04-16 DIAGNOSIS — R197 Diarrhea, unspecified: Secondary | ICD-10-CM | POA: Diagnosis present

## 2024-04-16 DIAGNOSIS — Z7982 Long term (current) use of aspirin: Secondary | ICD-10-CM | POA: Diagnosis not present

## 2024-04-16 DIAGNOSIS — R195 Other fecal abnormalities: Secondary | ICD-10-CM

## 2024-04-16 DIAGNOSIS — R0602 Shortness of breath: Secondary | ICD-10-CM | POA: Diagnosis present

## 2024-04-16 DIAGNOSIS — R8271 Bacteriuria: Secondary | ICD-10-CM | POA: Diagnosis present

## 2024-04-16 DIAGNOSIS — R1110 Vomiting, unspecified: Secondary | ICD-10-CM | POA: Insufficient documentation

## 2024-04-16 DIAGNOSIS — N1831 Chronic kidney disease, stage 3a: Secondary | ICD-10-CM | POA: Diagnosis present

## 2024-04-16 DIAGNOSIS — F419 Anxiety disorder, unspecified: Secondary | ICD-10-CM | POA: Diagnosis present

## 2024-04-16 DIAGNOSIS — K922 Gastrointestinal hemorrhage, unspecified: Secondary | ICD-10-CM | POA: Diagnosis present

## 2024-04-16 DIAGNOSIS — I129 Hypertensive chronic kidney disease with stage 1 through stage 4 chronic kidney disease, or unspecified chronic kidney disease: Secondary | ICD-10-CM | POA: Diagnosis not present

## 2024-04-16 DIAGNOSIS — Z79899 Other long term (current) drug therapy: Secondary | ICD-10-CM | POA: Diagnosis not present

## 2024-04-16 DIAGNOSIS — N183 Chronic kidney disease, stage 3 unspecified: Secondary | ICD-10-CM | POA: Diagnosis not present

## 2024-04-16 DIAGNOSIS — J432 Centrilobular emphysema: Secondary | ICD-10-CM | POA: Diagnosis present

## 2024-04-16 LAB — CBC
HCT: 45.9 % (ref 36.0–46.0)
Hemoglobin: 15.8 g/dL — ABNORMAL HIGH (ref 12.0–15.0)
MCH: 31.3 pg (ref 26.0–34.0)
MCHC: 34.4 g/dL (ref 30.0–36.0)
MCV: 90.9 fL (ref 80.0–100.0)
Platelets: 293 10*3/uL (ref 150–400)
RBC: 5.05 MIL/uL (ref 3.87–5.11)
RDW: 13.1 % (ref 11.5–15.5)
WBC: 11.9 10*3/uL — ABNORMAL HIGH (ref 4.0–10.5)
nRBC: 0 % (ref 0.0–0.2)

## 2024-04-16 LAB — URINALYSIS, ROUTINE W REFLEX MICROSCOPIC
Bilirubin Urine: NEGATIVE
Glucose, UA: NEGATIVE mg/dL
Hgb urine dipstick: NEGATIVE
Ketones, ur: NEGATIVE mg/dL
Nitrite: POSITIVE — AB
Protein, ur: NEGATIVE mg/dL
Specific Gravity, Urine: 1.024 (ref 1.005–1.030)
pH: 5 (ref 5.0–8.0)

## 2024-04-16 LAB — COMPREHENSIVE METABOLIC PANEL WITH GFR
ALT: 24 U/L (ref 0–44)
AST: 23 U/L (ref 15–41)
Albumin: 4.5 g/dL (ref 3.5–5.0)
Alkaline Phosphatase: 88 U/L (ref 38–126)
Anion gap: 10 (ref 5–15)
BUN: 23 mg/dL (ref 8–23)
CO2: 20 mmol/L — ABNORMAL LOW (ref 22–32)
Calcium: 9.7 mg/dL (ref 8.9–10.3)
Chloride: 107 mmol/L (ref 98–111)
Creatinine, Ser: 1.06 mg/dL — ABNORMAL HIGH (ref 0.44–1.00)
GFR, Estimated: 55 mL/min — ABNORMAL LOW (ref >60)
Glucose, Bld: 104 mg/dL — ABNORMAL HIGH (ref 70–99)
Potassium: 4.6 mmol/L (ref 3.5–5.1)
Sodium: 137 mmol/L (ref 135–145)
Total Bilirubin: 0.7 mg/dL (ref 0.0–1.2)
Total Protein: 8 g/dL (ref 6.5–8.1)

## 2024-04-16 LAB — LIPASE, BLOOD: Lipase: 35 U/L (ref 11–51)

## 2024-04-16 LAB — TROPONIN I (HIGH SENSITIVITY)
Troponin I (High Sensitivity): 4 ng/L (ref <18)
Troponin I (High Sensitivity): 6 ng/L (ref <18)

## 2024-04-16 LAB — RESP PANEL BY RT-PCR (RSV, FLU A&B, COVID)  RVPGX2
Influenza A by PCR: NEGATIVE
Influenza B by PCR: NEGATIVE
Resp Syncytial Virus by PCR: NEGATIVE
SARS Coronavirus 2 by RT PCR: NEGATIVE

## 2024-04-16 LAB — TYPE AND SCREEN
ABO/RH(D): A POS
Antibody Screen: NEGATIVE

## 2024-04-16 MED ORDER — LIDOCAINE VISCOUS HCL 2 % MT SOLN
15.0000 mL | Freq: Once | OROMUCOSAL | Status: AC
  Administered 2024-04-16: 15 mL via ORAL
  Filled 2024-04-16: qty 15

## 2024-04-16 MED ORDER — HYDROXYZINE PAMOATE 25 MG PO CAPS: 25.0000 mg | ORAL_CAPSULE | Freq: Three times a day (TID) | ORAL | Status: DC | PRN

## 2024-04-16 MED ORDER — ONDANSETRON HCL 4 MG/2ML IJ SOLN
4.0000 mg | Freq: Once | INTRAMUSCULAR | Status: AC
  Administered 2024-04-16: 4 mg via INTRAVENOUS
  Filled 2024-04-16: qty 2

## 2024-04-16 MED ORDER — SODIUM CHLORIDE 0.9 % IV BOLUS
500.0000 mL | Freq: Once | INTRAVENOUS | Status: AC
  Administered 2024-04-16: 500 mL via INTRAVENOUS

## 2024-04-16 MED ORDER — AMLODIPINE BESYLATE 10 MG PO TABS
10.0000 mg | ORAL_TABLET | Freq: Every day | ORAL | Status: DC
  Administered 2024-04-16: 10 mg via ORAL
  Filled 2024-04-16 (×2): qty 1

## 2024-04-16 MED ORDER — ONDANSETRON HCL 4 MG/2ML IJ SOLN: 4.0000 mg | Freq: Four times a day (QID) | INTRAMUSCULAR | Status: DC | PRN

## 2024-04-16 MED ORDER — ALUM & MAG HYDROXIDE-SIMETH 200-200-20 MG/5ML PO SUSP
30.0000 mL | Freq: Once | ORAL | Status: AC
  Administered 2024-04-16: 30 mL via ORAL
  Filled 2024-04-16: qty 30

## 2024-04-16 MED ORDER — HYDROXYZINE HCL 25 MG PO TABS
25.0000 mg | ORAL_TABLET | Freq: Three times a day (TID) | ORAL | Status: DC | PRN
  Filled 2024-04-16: qty 1

## 2024-04-16 MED ORDER — ALBUTEROL SULFATE (2.5 MG/3ML) 0.083% IN NEBU: 3.0000 mL | INHALATION_SOLUTION | RESPIRATORY_TRACT | Status: DC | PRN

## 2024-04-16 MED ORDER — PANTOPRAZOLE SODIUM 40 MG IV SOLR
40.0000 mg | Freq: Two times a day (BID) | INTRAVENOUS | Status: DC
  Administered 2024-04-16 – 2024-04-17 (×2): 40 mg via INTRAVENOUS
  Filled 2024-04-16 (×2): qty 10

## 2024-04-16 MED ORDER — BUDESON-GLYCOPYRROL-FORMOTEROL 160-9-4.8 MCG/ACT IN AERO
2.0000 | INHALATION_SPRAY | Freq: Two times a day (BID) | RESPIRATORY_TRACT | Status: DC
  Filled 2024-04-16: qty 5.9

## 2024-04-16 MED ORDER — SODIUM CHLORIDE 0.9 % IV SOLN
2.0000 g | Freq: Once | INTRAVENOUS | Status: AC
  Administered 2024-04-16: 2 g via INTRAVENOUS
  Filled 2024-04-16: qty 20

## 2024-04-16 MED ORDER — ACETAMINOPHEN 650 MG RE SUPP: 650.0000 mg | Freq: Four times a day (QID) | RECTAL | Status: DC | PRN

## 2024-04-16 MED ORDER — HYDRALAZINE HCL 20 MG/ML IJ SOLN: 5.0000 mg | INTRAMUSCULAR | Status: DC | PRN

## 2024-04-16 MED ORDER — ISOSORBIDE MONONITRATE ER 30 MG PO TB24
60.0000 mg | ORAL_TABLET | Freq: Every day | ORAL | Status: DC
  Filled 2024-04-16: qty 2

## 2024-04-16 MED ORDER — ONDANSETRON HCL 4 MG PO TABS
4.0000 mg | ORAL_TABLET | Freq: Four times a day (QID) | ORAL | Status: DC | PRN
Start: 2024-04-16 — End: 2024-04-17

## 2024-04-16 MED ORDER — BUSPIRONE HCL 10 MG PO TABS
5.0000 mg | ORAL_TABLET | Freq: Two times a day (BID) | ORAL | Status: DC
  Administered 2024-04-16 – 2024-04-17 (×2): 5 mg via ORAL
  Filled 2024-04-16 (×2): qty 1

## 2024-04-16 MED ORDER — FAMOTIDINE 20 MG PO TABS
20.0000 mg | ORAL_TABLET | Freq: Once | ORAL | Status: AC
  Administered 2024-04-16: 20 mg via ORAL
  Filled 2024-04-16: qty 1

## 2024-04-16 MED ORDER — MORPHINE SULFATE (PF) 2 MG/ML IV SOLN: 2.0000 mg | INTRAVENOUS | Status: DC | PRN

## 2024-04-16 MED ORDER — LOSARTAN POTASSIUM 50 MG PO TABS
100.0000 mg | ORAL_TABLET | Freq: Every day | ORAL | Status: DC
  Filled 2024-04-16: qty 2

## 2024-04-16 MED ORDER — FLUTICASONE PROPIONATE 50 MCG/ACT NA SUSP
2.0000 | Freq: Every day | NASAL | Status: DC
  Administered 2024-04-16 – 2024-04-17 (×2): 2 via NASAL
  Filled 2024-04-16: qty 16

## 2024-04-16 MED ORDER — PANTOPRAZOLE SODIUM 40 MG IV SOLR
40.0000 mg | Freq: Once | INTRAVENOUS | Status: AC
  Administered 2024-04-16: 40 mg via INTRAVENOUS
  Filled 2024-04-16: qty 10

## 2024-04-16 MED ORDER — LACTATED RINGERS IV SOLN: INTRAVENOUS | Status: DC

## 2024-04-16 MED ORDER — ACETAMINOPHEN 325 MG PO TABS
650.0000 mg | ORAL_TABLET | Freq: Four times a day (QID) | ORAL | Status: DC | PRN
  Administered 2024-04-16: 650 mg via ORAL
  Filled 2024-04-16: qty 2

## 2024-04-16 MED ORDER — ZOLPIDEM TARTRATE 5 MG PO TABS: 5.0000 mg | ORAL_TABLET | Freq: Every evening | ORAL | Status: DC | PRN

## 2024-04-16 MED ORDER — SERTRALINE HCL 50 MG PO TABS
100.0000 mg | ORAL_TABLET | Freq: Every day | ORAL | Status: DC
  Administered 2024-04-16 – 2024-04-17 (×2): 100 mg via ORAL
  Filled 2024-04-16 (×2): qty 2

## 2024-04-16 NOTE — ED Notes (Signed)
 ED Provider at bedside for rectal

## 2024-04-16 NOTE — ED Triage Notes (Signed)
 Pt states diarrhea since last night, pt endorses black emesis. Pt denies fevers. Pt unsure of sick contacts. NAD noted.

## 2024-04-16 NOTE — ED Provider Notes (Signed)
 [facility] Provider Note    Event Date/Time   First MD Initiated Contact with Patient 04/16/24 1307     (approximate)   History   Diarrhea  Pt states diarrhea since last night, pt endorses black emesis. Pt denies fevers. Pt unsure of sick contacts. NAD noted.    HPI Kristin Parker is a 73 y.o. female PMH CAD, anxiety, hyperlipidemia, hypertension presents for evaluation of nausea/vomiting/diarrhea - Patient said diarrhea for about 2 days has been worsening.  Initially light brown, then notes this morning she had an episode of jet black stool so she did take some Pepto-Bismol last night.  Now is more watery.  No known sick contacts, no recent antibiotics, no recent travel.  Endorses some chronic epigastric discomfort that is unchanged from prior, is status post cholecystectomy.  No chest pain or shortness of breath. - Currently this morning patient feels very lightheaded when she was on the toilet, eased herself down into the ground and lost consciousness for likely a few minutes.  Was already lying down when this occurred, no head strike. - Now is complaining of cramping sensation throughout her body  Per chart review, patient had a colonoscopy in 06/2022, polyps but otherwise unremarkable.  I do not see any prior endoscopies on my chart review.       Physical Exam   Triage Vital Signs: ED Triage Vitals  Encounter Vitals Group     BP 04/16/24 1119 118/61     Girls Systolic BP Percentile --      Girls Diastolic BP Percentile --      Boys Systolic BP Percentile --      Boys Diastolic BP Percentile --      Pulse Rate 04/16/24 1116 66     Resp 04/16/24 1116 17     Temp 04/16/24 1116 98.4 F (36.9 C)     Temp Source 04/16/24 1116 Oral     SpO2 04/16/24 1116 95 %     Weight 04/16/24 1116 160 lb (72.6 kg)     Height 04/16/24 1116 5' 7 (1.702 m)     Head Circumference --      Peak Flow --      Pain Score 04/16/24 1116 0     Pain Loc --      Pain  Education --      Exclude from Growth Chart --     Most recent vital signs: Vitals:   04/16/24 1500 04/16/24 1614  BP: (!) 148/64   Pulse: (!) 59   Resp: 11   Temp:  98.7 F (37.1 C)  SpO2: 95%      General: Awake, no distress.  CV:  Good peripheral perfusion. RRR, RP 2+ Resp:  Normal effort. CTAB Abd:  No distention.  Mild tenderness palpation in epigastrium/right upper quadrant only. Rectal:  Dark brown liquid stool, immediately Hemoccult positive, no frank blood visualized.    ED Results / Procedures / Treatments   Labs (all labs ordered are listed, but only abnormal results are displayed) Labs Reviewed  COMPREHENSIVE METABOLIC PANEL WITH GFR - Abnormal; Notable for the following components:      Result Value   CO2 20 (*)    Glucose, Bld 104 (*)    Creatinine, Ser 1.06 (*)    GFR, Estimated 55 (*)    All other components within normal limits  CBC - Abnormal; Notable for the following components:   WBC 11.9 (*)    Hemoglobin 15.8 (*)  All other components within normal limits  URINALYSIS, ROUTINE W REFLEX MICROSCOPIC - Abnormal; Notable for the following components:   Color, Urine YELLOW (*)    APPearance CLOUDY (*)    Nitrite POSITIVE (*)    Leukocytes,Ua SMALL (*)    Bacteria, UA FEW (*)    All other components within normal limits  RESP PANEL BY RT-PCR (RSV, FLU A&B, COVID)  RVPGX2  LIPASE, BLOOD  TYPE AND SCREEN  TROPONIN I (HIGH SENSITIVITY)  TROPONIN I (HIGH SENSITIVITY)     EKG  Ecg = sinus bradycardia, rate 59, no gross ST elevation or depression, no significant repolarization abnormality, normal axis, normal intervals.  Apparent sinus arrhythmia.  No clear evidence of ischemia on my read.   RADIOLOGY N/a    PROCEDURES:  Critical Care performed: No  Procedures   MEDICATIONS ORDERED IN ED: Medications  sodium chloride  0.9 % bolus 500 mL (0 mLs Intravenous Stopped 04/16/24 1509)  ondansetron  (ZOFRAN ) injection 4 mg (4 mg Intravenous  Given 04/16/24 1433)  alum & mag hydroxide-simeth (MAALOX/MYLANTA) 200-200-20 MG/5ML suspension 30 mL (30 mLs Oral Given 04/16/24 1433)    And  lidocaine  (XYLOCAINE ) 2 % viscous mouth solution 15 mL (15 mLs Oral Given 04/16/24 1433)  famotidine  (PEPCID ) tablet 20 mg (20 mg Oral Given 04/16/24 1444)  cefTRIAXone (ROCEPHIN) 2 g in sodium chloride  0.9 % 100 mL IVPB (0 g Intravenous Stopped 04/16/24 1509)  pantoprazole  (PROTONIX ) injection 40 mg (40 mg Intravenous Given 04/16/24 1615)     IMPRESSION / MDM / ASSESSMENT AND PLAN / ED COURSE  I reviewed the triage vital signs and the nursing notes.                              DDX/MDM/AP: Differential diagnosis includes, but is not limited to, gastroenteritis, food poisoning, viral syndrome including COVID-19 or influenza, consider possibility of GI bleeding.  Regard to syncopal episode, suspect vasovagal in the setting of diarrhea though consider cardiac arrhythmia, ACS, secondary to GI bleed.  Fortunately no history or physical findings to suggest traumatic injury from syncopal event.  Plan: - Labs - EKG - Cardiac monitor - IV fluid, Zofran  - Reassess  Patient's presentation is most consistent with acute presentation with potential threat to life or bodily function.  The patient is on the cardiac monitor to evaluate for evidence of arrhythmia and/or significant heart rate changes.  ED course below.  Workup with mild leukocytosis, evidence of possible urinary tract infection, mildly low bicarb.  Digital rectal exam with dark liquid stool though not frankly black, immediately Hemoccult positive.  Discussed with GI who felt inpatient or outpatient evaluation would be reasonable given stable H&H.  Discussed overall clinical picture with patient including reported melena, syncopal episode, evidence of underlying UTI, and likely mild dehydration--felt most comfortable with admission.  No other clear source of nausea vomiting, treated with ceftriaxone.   Status post pantoprazole  in emergency department.  Admitted to hospitalist service, can consult GI in a.m. for evaluation as needed.  Clinical Course as of 04/16/24 1640  Thu Apr 16, 2024  1344 Urinalysis concerning for infection though some evidence of contamination  CBC with mild leukocytosis, evaded hemoglobin (suspect likely hemoconcentration in the setting of dehydration)  [MM]  1345 CMP reviewed, at baseline [MM]  1520 Trop wnl [MM]  1521 Gi paged to discuss [MM]  1531 D/w Dr. Ole Berkeley of GI Would be happy to see patient as an inpatient goal  could otherwise be evaluated in the outpatient setting given no active melena now hemoglobin stable.  I will reevaluate patient shortly [MM]  1553 Viral swab neg [MM]  1600 Patient reevaluated, discussed overall clinical picture, offered admission which she ultimately would like to proceed with in the setting of syncope, melena, ongoing diarrhea with some likely mild dehydration, UTI.  Hospitalist consult order placed [MM]    Clinical Course User Index [MM] Collis Deaner, MD     FINAL CLINICAL IMPRESSION(S) / ED DIAGNOSES   Final diagnoses:  Diarrhea, unspecified type  Dark stools  Syncope, unspecified syncope type  Vomiting, unspecified vomiting type, unspecified whether nausea present     Rx / DC Orders   ED Discharge Orders     None        Note:  This document was prepared using Dragon voice recognition software and may include unintentional dictation errors.   Collis Deaner, MD 04/16/24 1640

## 2024-04-16 NOTE — H&P (Signed)
 History and Physical    Patient: Kristin Parker ZDG:[id number] DOB: 11-20-1950 DOA: 04/16/2024 DOS: the patient was seen and examined on 04/16/2024 PCP: Raina Bunting, DO  Patient coming from: Home - lives alone, has family nearby; [place]: daughter, Carmelina Chinchilla, [phone]   Chief Complaint: GI bleeding  HPI: Kristin Parker is a 73 y.o. female with medical history significant of carotid stenosis, CAD, COPD, HTN, and HLD who presented on 6/19 with GI bleeding.  On Tuesday night, she ate pizza and salad from the bar.  30 minutes later, she was belching boiled egg (which was on the salad).  On Wednesday AM, she didn't feel well.  That afternoon, she developed diarrhea.  She went to bed, awoke this AM at 0111 and was again sick with diarrhea.  As the day progressed, she was very nauseated and started vomiting with pain.  This continued all night.  The last time, she knew she was going to pass out, sweating, slid herself onto the floor.  She did lose consciousness.  She crawled back to bed, muscles were locked.  Severe epigastric pain.  At the very end, she was vomiting black, slimy material this AM x 3 and she had black diarrhea too.  Last emesis was about 0800.   She had one stool here, no longer black.  No urinary symptoms.  Last colonoscopy was 3 years ago with a few ago, due again soon.  She takes Mobic  for R shoulder pain, Tylenol .  She has never had an EGD but does not chronic abdominal distention..    ER Course:  Melena, syncopal episode.  2 days of diarrhea, frank melena this AM.  Syncope on the commode this AM with prodrome.  No head injury.  Feels weak.  +UTI, no symptoms.  WBC 12.  Rectal with dark, liquid stool, heme positive.  Dr. Ole Berkeley is happy to consult in AM,.will need to be consulted.  Given GI cocktail, famotidine , PPI.  Given Rocephin for UTI.     Review of Systems: As mentioned in the history of present illness. All other systems reviewed and are negative. Past Medical History:   Diagnosis Date   Anxiety    Aortic valve sclerosis    Bilateral carotid bruits    Carotid artery stenosis    COPD (chronic obstructive pulmonary disease) (HCC)    Hypertension    Mitral regurgitation    Mixed hyperlipidemia    Shoulder pain    Sleeping difficulties    Past Surgical History:  Procedure Laterality Date   BREAST BIOPSY Left 08/13/2018   affirm bx, x clip, path pending   CARDIAC CATHETERIZATION     CATARACT EXTRACTION Left    2017   CHOLECYSTECTOMY     COLONOSCOPY  2012   Dr Janine Melbourne   COLONOSCOPY WITH PROPOFOL  N/A 05/15/2021   Procedure: COLONOSCOPY WITH PROPOFOL ;  Surgeon: Luke Salaam, MD;  Location: [facility] ENDOSCOPY;  Service: Gastroenterology;  Laterality: N/A;   COLONOSCOPY WITH PROPOFOL  N/A 07/03/2022   Procedure: COLONOSCOPY WITH PROPOFOL ;  Surgeon: Luke Salaam, MD;  Location: [facility] ENDOSCOPY;  Service: Gastroenterology;  Laterality: N/A;   EYE SURGERY     LEFT HEART CATH AND CORONARY ANGIOGRAPHY N/A 06/25/2017   Procedure: LEFT HEART CATH AND CORONARY ANGIOGRAPHY;  Surgeon: Sammy Crisp, MD;  Location: [facility] INVASIVE CV LAB;  Service: Cardiovascular;  Laterality: N/A;   Social History:  reports that she quit smoking about 3 years ago. Her smoking use included cigarettes. She started smoking about 33 years ago.  She has a 15 pack-year smoking history. She has never used smokeless tobacco. She reports that she does not drink alcohol and does not use drugs.  Allergies  Allergen Reactions   Bactrim  [Sulfamethoxazole -Trimethoprim ] Nausea And Vomiting   Aleve [Naproxen Sodium] Swelling    Lips. But patient can take Ibuprofen  without problems   Atorvastatin     myalgias   Crestor  [Rosuvastatin ]     5mg , 20mg  - myalgias   Simvastatin      myalgias   Zetia  [Ezetimibe ]     myalgias    Family History  Problem Relation Age of Onset   Cancer Mother        bladder cancer   Heart disease Mother 24       Pacemaker   Cancer Father        lung   Multiple sclerosis  Sister    Valvular heart disease Brother 62       s/p bioprosthetic valve replacement at [facility]   Breast cancer Neg Hx     Prior to Admission medications   Medication Sig Start Date End Date Taking? Authorizing Provider  acetaminophen  (TYLENOL ) 500 MG tablet Take 2 tablets (1,000 mg total) by mouth every 6 (six) hours as needed for mild pain. 02/19/21   Emmalene Hare, MD  acetic acid -hydrocortisone  (VOSOL -HC) OTIC solution Place 3 drops into both ears 3 (three) times daily. May use wick for first 24 hours Patient not taking: Reported on 04/01/2024 12/11/22   Raina Bunting, DO  albuterol  (VENTOLIN  HFA) 108 (90 Base) MCG/ACT inhaler INHALE 2 PUFFS EVERY 4 HOURS FOR WHEEZING OR SHORTNESS OF BREATH AS NEEDED 10/23/22   Romeo Co, Kayleen Party, DO  amLODipine  (NORVASC ) 10 MG tablet TAKE 1 TABLET BY MOUTH ONCE DAILY 11/29/23   Romeo Co, Kayleen Party, DO  aspirin  EC 81 MG tablet Take 1 tablet (81 mg total) by mouth daily. 03/12/17   End, Veryl Gottron, MD  busPIRone  (BUSPAR ) 5 MG tablet Take 1 tablet (5 mg total) by mouth 2 (two) times daily. 04/09/23   Karamalegos, Kayleen Party, DO  estradiol  (ESTRACE ) 0.1 MG/GM vaginal cream Apply 1 gram per vagina every night for 2 weeks, then apply three times a week 12/17/23   Raina Bunting, DO  famotidine  (PEPCID ) 20 MG tablet TAKE 1 TABLET BY MOUTH TWICE DAILY 01/22/24   End, Veryl Gottron, MD  fluticasone  (FLONASE ) 50 MCG/ACT nasal spray USE 2 SPRAYS INTO EACH NOSTRIL ONCE DAILY 04/29/23   Romeo Co, Kayleen Party, DO  Fluticasone -Umeclidin-Vilant (TRELEGY ELLIPTA ) 200-62.5-25 MCG/ACT AEPB Inhale 1 Inhalation into the lungs daily. 04/03/23   Vergia Glasgow, MD  hydrOXYzine  (VISTARIL ) 25 MG capsule TAKE 1 CAPSULE BY MOUTH EVERY 8 HOURS ASNEEDED 01/23/24   Karamalegos, Kayleen Party, DO  icosapent  Ethyl (VASCEPA ) 1 g capsule Take 2 capsules (2 g total) by mouth 2 (two) times daily. Patient not taking: Reported on 04/01/2024 11/01/22   End, Veryl Gottron, MD   isosorbide  mononitrate (IMDUR ) 60 MG 24 hr tablet TAKE 1 TABLET BY MOUTH ONCE DAILY 12/10/23   End, Veryl Gottron, MD  losartan  (COZAAR ) 100 MG tablet Take 1 tablet (100 mg total) by mouth daily. 12/17/23   Karamalegos, Kayleen Party, DO  meloxicam  (MOBIC ) 15 MG tablet TAKE 1 TABLET BY MOUTH ONCE DAILY AS NEEDED FOR PAIN WITH FOOD 01/23/24   Romeo Co, Kayleen Party, DO  REPATHA  SURECLICK 140 MG/ML SOAJ INJECT 1 DOSE INTO THE SKIN EVERY 14 DAYS 12/26/23   End, Veryl Gottron, MD  rizatriptan  (MAXALT -MLT) 10 MG disintegrating tablet DISSOLVE 1 TABLET ON  THE TONGUE AS NEEDED FOR MIGRAINE. MAY REPEAT IN 2 HOURS IF NEEDED 02/14/24   Romeo Co, Kayleen Party, DO  sertraline  (ZOLOFT ) 100 MG tablet TAKE 1 TABLET BY MOUTH ONCE DAILY 11/29/23   Romeo Co, Kayleen Party, DO  WEGOVY  1 MG/0.5ML SOAJ Inject 1 mg into the skin once a week. 04/01/24   Karamalegos, Kayleen Party, DO  zolpidem  (AMBIEN  CR) 6.25 MG CR tablet Take 1 tablet (6.25 mg total) by mouth at bedtime as needed. for sleep 09/11/23   Raina Bunting, DO    Physical Exam: Vitals:   04/16/24 1500 04/16/24 1600 04/16/24 1614 04/16/24 1756  BP: (!) 148/64 (!) 162/71  (!) 165/65  Pulse: (!) 59 (!) 58  (!) 56  Resp: 11 15  17   Temp:   98.7 F (37.1 C) 97.7 F (36.5 C)  TempSrc:   Oral Oral  SpO2: 95% 94%  95%  Weight:      Height:       General:  Appears calm and comfortable and is in NAD Eyes:  PERRL, EOMI, normal lids, iris ENT:  grossly normal hearing, lips & tongue, mmm Neck:  no LAD, masses or thyromegaly Cardiovascular:  RRR. No LE edema.  Respiratory:   CTA bilaterally with no wheezes/rales/rhonchi.  Normal respiratory effort. Abdomen:  soft, diffusely TTP, markedly distended Skin:  no rash or induration seen on limited exam Musculoskeletal:  grossly normal tone BUE/BLE, good ROM, no bony abnormality Psychiatric:  grossly normal mood and affect, speech fluent and appropriate, AOx3 Neurologic:  CN 2-12 grossly intact, moves all  extremities in coordinated fashion   Radiological Exams on Admission: Independently reviewed - see discussion in A/P where applicable  No results found.  EKG: Independently reviewed.  Sinus bradycardia with rate 59; nonspecific ST changes with no evidence of acute ischemia   Labs on Admission: I have personally reviewed the available labs and imaging studies at the time of the admission.  Pertinent labs:    CO2 20 BUN 23/Creatinine 1.06/GFR 55, stable HS troponin 4 WBC 11.9 Hgb 15.8 UA: small LE, + nitrite, few bacteria   Assessment and Plan: Principal Problem:   Acute upper GI bleed Active Problems:   Essential hypertension   Hyperlipidemia LDL goal <70   Chronic anxiety   Centrilobular emphysema (HCC)   Coronary artery disease of native artery of native heart with stable angina pectoris (HCC)   Chronic kidney disease, stage 3a (HCC)   Asymptomatic bacteriuria    Upper GI Bleeding Patient presented with melena and hematemesis, suggestive of upper GI bleeding. Patient's syncopal episode on the commode was most likely caused by anemia secondary to upper GI bleeding.  She takes Mobic  routinely for shoulder pain, ASA - holding both Her symptoms are somewhat suggestive of a gastroenteritis but given her significant epigastric pain, distention, and COX-2 use, an EGD is appropriate Will observe on med surg GI consulted by ED, will request evaluation and possible EGD in AM CLD tonight, NPO after MN for possible EGD NS at 100 mL/hr Start IV pantoprazole  40 BID  Zofran  IV for nausea Avoid NSAIDs and SQ heparin  Maintain IV access (2 large bore IVs if possible). Type and screen were done in ED Monitor closely and follow cbc q12h, transfuse as necessary for Hbg <7  HTN Continue amlodipine  Resume losartan  in the AM  CAD/Carotid stenosis Hold ASA Continue Imdur   HLD On Repatha  as an outpatient Statin intolerance  Stage 3a CKD Appears to be stable at this  time Attempt to  avoid nephrotoxic medications Recheck BMP in AM   Anxiety Continue buspirone , hydroxyzine , sertraline , Ambien   COPD Stopped smoking 3-4 years ago Continue Trelegy (Breztri  per formulary), Flonase , Albuterol   Asymptomatic bacteriuria Patient with abnormal UA in the ER (small LE, +nitrite, few bacteria) No symptoms whatsoever Given one dose of Ceftriaxone She does not require additional doses of antibiotics at this time  Overweight On Wegovy  as an outpatient     Advance Care Planning:   Code Status: Full Code - Code status was discussed with the patient and/or family at the time of admission.  The patient would want to receive full resuscitative measures at this time.   Consults: GI  DVT Prophylaxis: SCDs  Family Communication: Daughter was present throughout  Severity of Illness: The appropriate patient status for this patient is OBSERVATION. Observation status is judged to be reasonable and necessary in order to provide the required intensity of service to ensure the patient's safety. The patient's presenting symptoms, physical exam findings, and initial radiographic and laboratory data in the context of their medical condition is felt to place them at decreased risk for further clinical deterioration. Furthermore, it is anticipated that the patient will be medically stable for discharge from the hospital within 2 midnights of admission.   Author: Lorita Rosa, MD 04/16/2024 6:10 PM  For on call review [email or web address].

## 2024-04-17 ENCOUNTER — Observation Stay: Admitting: General Practice

## 2024-04-17 ENCOUNTER — Encounter: Admission: EM | Disposition: A | Discharge status: Home / Self Care | Payer: Self-pay | Source: Home / Self Care

## 2024-04-17 ENCOUNTER — Encounter: Payer: Self-pay | Admitting: Gastroenterology

## 2024-04-17 DIAGNOSIS — K922 Gastrointestinal hemorrhage, unspecified: Secondary | ICD-10-CM | POA: Diagnosis not present

## 2024-04-17 DIAGNOSIS — K920 Hematemesis: Secondary | ICD-10-CM | POA: Diagnosis not present

## 2024-04-17 HISTORY — PX: ESOPHAGOGASTRODUODENOSCOPY: SHX5428

## 2024-04-17 LAB — BASIC METABOLIC PANEL WITH GFR
Anion gap: 6 (ref 5–15)
BUN: 19 mg/dL (ref 8–23)
CO2: 24 mmol/L (ref 22–32)
Calcium: 8.8 mg/dL — ABNORMAL LOW (ref 8.9–10.3)
Chloride: 111 mmol/L (ref 98–111)
Creatinine, Ser: 0.98 mg/dL (ref 0.44–1.00)
GFR, Estimated: >60 mL/min (ref >60)
Glucose, Bld: 94 mg/dL (ref 70–99)
Potassium: 4.1 mmol/L (ref 3.5–5.1)
Sodium: 141 mmol/L (ref 135–145)

## 2024-04-17 LAB — CBC
HCT: 41.1 % (ref 36.0–46.0)
Hemoglobin: 13.8 g/dL (ref 12.0–15.0)
MCH: 30.7 pg (ref 26.0–34.0)
MCHC: 33.6 g/dL (ref 30.0–36.0)
MCV: 91.3 fL (ref 80.0–100.0)
Platelets: 225 10*3/uL (ref 150–400)
RBC: 4.5 MIL/uL (ref 3.87–5.11)
RDW: 13.1 % (ref 11.5–15.5)
WBC: 8.1 10*3/uL (ref 4.0–10.5)
nRBC: 0 % (ref 0.0–0.2)

## 2024-04-17 SURGERY — EGD (ESOPHAGOGASTRODUODENOSCOPY)
Anesthesia: General

## 2024-04-17 MED ORDER — LIDOCAINE HCL (CARDIAC) PF 100 MG/5ML IV SOSY
PREFILLED_SYRINGE | INTRAVENOUS | Status: DC | PRN
  Administered 2024-04-17: 50 mg via INTRAVENOUS

## 2024-04-17 MED ORDER — PROPOFOL 10 MG/ML IV BOLUS
INTRAVENOUS | Status: DC | PRN
  Administered 2024-04-17: 50 mg via INTRAVENOUS

## 2024-04-17 MED ORDER — FENTANYL CITRATE (PF) 100 MCG/2ML IJ SOLN
INTRAMUSCULAR | Status: AC
Start: 2024-04-17 — End: 2024-04-17
  Filled 2024-04-17: qty 2

## 2024-04-17 MED ORDER — OMEPRAZOLE MAGNESIUM 20 MG PO TBEC: 20.0000 mg | DELAYED_RELEASE_TABLET | Freq: Every day | ORAL | 1 refills | Status: DC

## 2024-04-17 MED ORDER — PROPOFOL 10 MG/ML IV BOLUS
INTRAVENOUS | Status: AC
  Filled 2024-04-17: qty 20

## 2024-04-17 MED ORDER — SODIUM CHLORIDE 0.9 % IV SOLN
INTRAVENOUS | Status: DC
  Administered 2024-04-17: 20 mL/h via INTRAVENOUS

## 2024-04-17 MED ORDER — LIDOCAINE HCL (PF) 2 % IJ SOLN
INTRAMUSCULAR | Status: AC
  Filled 2024-04-17: qty 10

## 2024-04-17 MED ORDER — FENTANYL CITRATE (PF) 100 MCG/2ML IJ SOLN
INTRAMUSCULAR | Status: DC | PRN
  Administered 2024-04-17: 50 ug via INTRAVENOUS

## 2024-04-17 NOTE — Discharge Summary (Signed)
 Physician Discharge Summary   Patient: Kristin Parker MRN: [id number] DOB: October 14, 1951  Admit date:     04/16/2024  Discharge date: 04/17/24  Discharge Physician: Ezzard Holms   PCP: Raina Bunting, DO   Recommendations at discharge:  Follow-up with GI as well as PCP  Discharge Diagnoses:  Upper GI Bleeding HTN CAD/Carotid stenosis HLD Stage 3a CKD Anxiety COPD Asymptomatic bacteriuria Overweight  Hospital Course: Kristin Parker is a 73 y.o. female with medical history significant of carotid stenosis, CAD, COPD, HTN, and HLD who presented on 6/19 with GI bleeding.  On Tuesday night, she ate pizza and salad from the bar.  30 minutes later, she was belching boiled egg (which was on the salad).  On Wednesday AM, she didn't feel well.  That afternoon, she developed diarrhea.  She went to bed, awoke this AM at 0111 and was again sick with diarrhea.  As the day progressed, she was very nauseated and started vomiting with pain.  This continued all night.  Last colonoscopy was 3 years ago with a few ago, due again soon.  Patient was taking for EGD by Dr. Ole Berkeley today that did not show any findings of ulceration or active GI bleeding.  There was some findings concerning for possible Barrett's esophagus and patient has been recommended for outpatient follow-up with repeat EGD  Consultants: GI Procedures performed: EGD Disposition: Home Diet recommendation:  Cardiac diet DISCHARGE MEDICATION: Allergies as of 04/17/2024       Reactions   Bactrim  [sulfamethoxazole -trimethoprim ] Nausea And Vomiting   Aleve [naproxen Sodium] Swelling   Lips. But patient can take Ibuprofen  without problems   Atorvastatin    myalgias   Crestor  [rosuvastatin ]    5mg , 20mg  - myalgias   Simvastatin     myalgias   Zetia  [ezetimibe ]    myalgias        Medication List     STOP taking these medications    meloxicam  15 MG tablet Commonly known as: MOBIC        TAKE these medications     acetaminophen  500 MG tablet Commonly known as: TYLENOL  Take 2 tablets (1,000 mg total) by mouth every 6 (six) hours as needed for mild pain.   albuterol  108 (90 Base) MCG/ACT inhaler Commonly known as: VENTOLIN  HFA INHALE 2 PUFFS EVERY 4 HOURS FOR WHEEZING OR SHORTNESS OF BREATH AS NEEDED   amLODipine  10 MG tablet Commonly known as: NORVASC  TAKE 1 TABLET BY MOUTH ONCE DAILY   aspirin  EC 81 MG tablet Take 1 tablet (81 mg total) by mouth daily.   busPIRone  5 MG tablet Commonly known as: BUSPAR  Take 1 tablet (5 mg total) by mouth 2 (two) times daily.   estradiol  0.1 MG/GM vaginal cream Commonly known as: ESTRACE  Apply 1 gram per vagina every night for 2 weeks, then apply three times a week   famotidine  20 MG tablet Commonly known as: PEPCID  TAKE 1 TABLET BY MOUTH TWICE DAILY   fluticasone  50 MCG/ACT nasal spray Commonly known as: FLONASE  USE 2 SPRAYS INTO EACH NOSTRIL ONCE DAILY   hydrOXYzine  25 MG capsule Commonly known as: VISTARIL  TAKE 1 CAPSULE BY MOUTH EVERY 8 HOURS ASNEEDED   isosorbide  mononitrate 60 MG 24 hr tablet Commonly known as: IMDUR  TAKE 1 TABLET BY MOUTH ONCE DAILY   losartan  100 MG tablet Commonly known as: COZAAR  Take 1 tablet (100 mg total) by mouth daily.   omeprazole 20 MG tablet Commonly known as: PriLOSEC OTC Take 1 tablet (20 mg total)  by mouth daily.   Repatha  SureClick 140 MG/ML Soaj Generic drug: Evolocumab  INJECT 1 DOSE INTO THE SKIN EVERY 14 DAYS   rizatriptan  10 MG disintegrating tablet Commonly known as: MAXALT -MLT DISSOLVE 1 TABLET ON THE TONGUE AS NEEDED FOR MIGRAINE. MAY REPEAT IN 2 HOURS IF NEEDED   sertraline  100 MG tablet Commonly known as: ZOLOFT  TAKE 1 TABLET BY MOUTH ONCE DAILY   Trelegy Ellipta  200-62.5-25 MCG/ACT Aepb Generic drug: Fluticasone -Umeclidin-Vilant Inhale 1 Inhalation into the lungs daily.   Wegovy  1 MG/0.5ML Soaj Generic drug: Semaglutide -Weight Management Inject 1 mg into the skin once a week.    zolpidem  6.25 MG CR tablet Commonly known as: AMBIEN  CR Take 1 tablet (6.25 mg total) by mouth at bedtime as needed. for sleep        Discharge Exam: Filed Weights   04/16/24 1116  Weight: 72.6 kg   General:  Appears calm and comfortable and is in NAD Eyes:  PERRL, EOMI, normal lids, iris ENT:  grossly normal hearing, lips & tongue, mmm Neck:  no LAD, masses or thyromegaly Cardiovascular:  RRR. No LE edema.  Respiratory:   CTA bilaterally with no wheezes/rales/rhonchi.  Normal respiratory effort. Abdomen: Soft nontender Skin:  no rash or induration seen on limited exam Musculoskeletal:  grossly normal tone BUE/BLE, good ROM, no bony abnormality Psychiatric:  grossly normal mood and affect, speech fluent and appropriate, AOx3 Neurologic:  CN 2-12 grossly intact, moves all extremities in coordinated fashion  Condition at discharge: good  The results of significant diagnostics from this hospitalization (including imaging, microbiology, ancillary and laboratory) are listed below for reference.   Imaging Studies: No results found.  Microbiology: Results for orders placed or performed during the hospital encounter of 04/16/24  Resp panel by RT-PCR (RSV, Flu A&B, Covid) Anterior Nasal Swab     Status: None   Collection Time: 04/16/24  2:23 PM   Specimen: Anterior Nasal Swab  Result Value Ref Range Status   SARS Coronavirus 2 by RT PCR NEGATIVE NEGATIVE Final    Comment: (NOTE) SARS-CoV-2 target nucleic acids are NOT DETECTED.  The SARS-CoV-2 RNA is generally detectable in upper respiratory specimens during the acute phase of infection. The lowest concentration of SARS-CoV-2 viral copies this assay can detect is 138 copies/mL. A negative result does not preclude SARS-Cov-2 infection and should not be used as the sole basis for treatment or other patient management decisions. A negative result may occur with  improper specimen collection/handling, submission of specimen  other than nasopharyngeal swab, presence of viral mutation(s) within the areas targeted by this assay, and inadequate number of viral copies(<138 copies/mL). A negative result must be combined with clinical observations, patient history, and epidemiological information. The expected result is Negative.  Fact Sheet for Patients:  BloggerCourse.com  Fact Sheet for Healthcare Providers:  SeriousBroker.it  This test is no t yet approved or cleared by the [place]  FDA and  has been authorized for detection and/or diagnosis of SARS-CoV-2 by FDA under an Emergency Use Authorization (EUA). This EUA will remain  in effect (meaning this test can be used) for the duration of the COVID-19 declaration under Section 564(b)(1) of the Act, 21 U.S.C.section 360bbb-3(b)(1), unless the authorization is terminated  or revoked sooner.       Influenza A by PCR NEGATIVE NEGATIVE Final   Influenza B by PCR NEGATIVE NEGATIVE Final    Comment: (NOTE) The Xpert Xpress SARS-CoV-2/FLU/RSV plus assay is intended as an aid in the diagnosis of influenza from Nasopharyngeal  swab specimens and should not be used as a sole basis for treatment. Nasal washings and aspirates are unacceptable for Xpert Xpress SARS-CoV-2/FLU/RSV testing.  Fact Sheet for Patients: BloggerCourse.com  Fact Sheet for Healthcare Providers: SeriousBroker.it  This test is not yet approved or cleared by the [place]  FDA and has been authorized for detection and/or diagnosis of SARS-CoV-2 by FDA under an Emergency Use Authorization (EUA). This EUA will remain in effect (meaning this test can be used) for the duration of the COVID-19 declaration under Section 564(b)(1) of the Act, 21 U.S.C. section 360bbb-3(b)(1), unless the authorization is terminated or revoked.     Resp Syncytial Virus by PCR NEGATIVE NEGATIVE Final     Comment: (NOTE) Fact Sheet for Patients: BloggerCourse.com  Fact Sheet for Healthcare Providers: SeriousBroker.it  This test is not yet approved or cleared by the [place]  FDA and has been authorized for detection and/or diagnosis of SARS-CoV-2 by FDA under an Emergency Use Authorization (EUA). This EUA will remain in effect (meaning this test can be used) for the duration of the COVID-19 declaration under Section 564(b)(1) of the Act, 21 U.S.C. section 360bbb-3(b)(1), unless the authorization is terminated or revoked.  Performed at [facility], [address] [place], [place] [address]     Labs: CBC: Recent Labs  Lab 04/16/24 1119 04/17/24 0243  WBC 11.9* 8.1  HGB 15.8* 13.8  HCT 45.9 41.1  MCV 90.9 91.3  PLT 293 225   Basic Metabolic Panel: Recent Labs  Lab 04/16/24 1119 04/17/24 0243  NA 137 141  K 4.6 4.1  CL 107 111  CO2 20* 24  GLUCOSE 104* 94  BUN 23 19  CREATININE 1.06* 0.98  CALCIUM  9.7 8.8*   Liver Function Tests: Recent Labs  Lab 04/16/24 1119  AST 23  ALT 24  ALKPHOS 88  BILITOT 0.7  PROT 8.0  ALBUMIN 4.5   CBG: No results for input(s): GLUCAP in the last 168 hours.  Discharge time spent:  .  Signed: Ezzard Holms, MD [facility] 04/17/2024

## 2024-04-17 NOTE — Care Plan (Signed)
 Procedure completed Endo Patient alert and oriented - denies pain at this time AVSS at this time Report placed in chart - report called to RN

## 2024-04-17 NOTE — Progress Notes (Signed)
 The patient EGD showed a large amount of food in the stomach and going into the small bowel.  There is no sign of any ulceration although the majority of the stomach was obscured by the retained food.  Of note there was no black or bloody contents in the stomach indicating no sign of active or recent GI bleeding.  Patient should have a repeat EGD as an outpatient due to the irregular Z-line to rule out Barrett's.  The patient is presently on Wegovy  which is likely the cause of the retained food.  The repeat should be done when the patient has been off of the Wegovy  for 1 week.  I will sign off.  Please call if any further GI concerns or questions.  We would like to thank you for the opportunity to participate in the care of Kristin Parker.

## 2024-04-17 NOTE — Op Note (Signed)
 [facility] Gastroenterology Patient Name: Kristin Parker Procedure Date: 04/17/2024 7:13 AM MRN: [id number] Account #: [id number] Date of Birth: May 25, 1951 Admit Type: Inpatient Age: 73 Room: [facility] ENDO ROOM 3 Gender: Female Note Status: Finalized Instrument Name: Upper Endoscope [id number] Procedure:             Upper GI endoscopy Indications:           Hematemesis Providers:             Marnee Sink MD, MD Referring MD:          Raina Bunting (Referring MD) Medicines:             Propofol  per Anesthesia Complications:         No immediate complications. Procedure:             Pre-Anesthesia Assessment:                        - Prior to the procedure, a History and Physical was                         performed, and patient medications and allergies were                         reviewed. The patient's tolerance of previous                         anesthesia was also reviewed. The risks and benefits                         of the procedure and the sedation options and risks                         were discussed with the patient. All questions were                         answered, and informed consent was obtained. Prior                         Anticoagulants: The patient has taken no anticoagulant                         or antiplatelet agents. ASA Grade Assessment: II - A                         patient with mild systemic disease. After reviewing                         the risks and benefits, the patient was deemed in                         satisfactory condition to undergo the procedure.                        After obtaining informed consent, the endoscope was                         passed under direct vision. Throughout the procedure,  the patient's blood pressure, pulse, and oxygen                         saturations were monitored continuously. The Endoscope                         was introduced through the mouth, and  advanced to the                         duodenal bulb. The upper GI endoscopy was accomplished                         without difficulty. The patient tolerated the                         procedure well. Findings:      The Z-line was irregular.      A large amount of food (residue) was found in the entire examined       stomach.      Food (residue) was found in the entire duodenum. Impression:            - Z-line irregular.                        - A large amount of food (residue) in the stomach.                        - Retained food in the duodenum.                        - No specimens collected.                        - Despite the large amount of food in the stomach                         there was no sign of any old blood or fresh blood                         indicating any GI bleeding. Therefore my suspicion for                         any ongoing GI bleeding is low. Recommendation:        - Return patient to hospital ward for ongoing care.                        - Resume regular diet.                        - Continue present medications.                        - Repeat upper endoscopy as an outpatient because the                         preparation was poor. Procedure Code(s):     --- Professional ---  16109, Esophagogastroduodenoscopy, flexible,                         transoral; diagnostic, including collection of                         specimen(s) by brushing or washing, when performed                         (separate procedure) Diagnosis Code(s):     --- Professional ---                        K92.0, Hematemesis CPT copyright 2022 American Medical Association. All rights reserved. The codes documented in this report are preliminary and upon coder review may  be revised to meet current compliance requirements. Marnee Sink MD, MD 04/17/2024 7:41:34 AM This report has been signed electronically. Number of Addenda: 0 Note Initiated On: 04/17/2024  7:13 AM Estimated Blood Loss:  Estimated blood loss: none.      [facility]

## 2024-04-17 NOTE — Consult Note (Signed)
 Consulted last night for this patient with what appears to be a viral illness but had black emesis and the hospitalist was requesting an EGD this morning.  The patient was kept n.p.o. after midnight and set up for an EGD for this morning.  The patient will have her EGD this morning.  Please see EGD note for further recommendations.

## 2024-04-17 NOTE — Transfer of Care (Signed)
 Immediate Anesthesia Transfer of Care Note  Patient: Kristin Parker  Procedure(s) Performed: EGD (ESOPHAGOGASTRODUODENOSCOPY)  Patient Location: PACU  Anesthesia Type:MAC  Level of Consciousness: awake, alert , and oriented  Airway & Oxygen Therapy: Patient Spontanous Breathing  Post-op Assessment: Report given to RN and Post -op Vital signs reviewed and stable  Post vital signs: stable  Last Vitals:  Vitals Value Taken Time  BP 145/58 04/17/24 07:39  Temp    Pulse 55 04/17/24 07:39  Resp 18 04/17/24 07:39  SpO2 96 % 04/17/24 07:39  Vitals shown include unfiled device data.  Last Pain:  Vitals:   04/17/24 0635  TempSrc: Temporal  PainSc: 0-No pain      Patients Stated Pain Goal: 0 (04/17/24 0331)  Complications: No notable events documented.

## 2024-04-17 NOTE — Plan of Care (Signed)
  Problem: Education: Goal: Knowledge of General Education information will improve Description: Including pain rating scale, medication(s)/side effects and non-pharmacologic comfort measures Outcome: Adequate for Discharge   Problem: Clinical Measurements: Goal: Ability to maintain clinical measurements within normal limits will improve Outcome: Adequate for Discharge Goal: Diagnostic test results will improve Outcome: Adequate for Discharge   Problem: Activity: Goal: Risk for activity intolerance will decrease Outcome: Adequate for Discharge

## 2024-04-17 NOTE — Anesthesia Postprocedure Evaluation (Signed)
 Anesthesia Post Note  Patient: Kristin Parker  Procedure(s) Performed: EGD (ESOPHAGOGASTRODUODENOSCOPY)  Patient location during evaluation: PACU Anesthesia Type: General Level of consciousness: awake Pain management: satisfactory to patient Vital Signs Assessment: post-procedure vital signs reviewed and stable Respiratory status: spontaneous breathing Cardiovascular status: stable Anesthetic complications: no   No notable events documented.   Last Vitals:  Vitals:   04/17/24 0805 04/17/24 0818  BP:  (!) 154/65  Pulse: (!) 55 (!) 55  Resp: 11 18  Temp: (!) 35.6 C   SpO2: 93% 93%    Last Pain:  Vitals:   04/17/24 0805  TempSrc: Temporal  PainSc:                  VAN STAVEREN,Kristin Parker

## 2024-04-17 NOTE — Anesthesia Preprocedure Evaluation (Signed)
 Anesthesia Evaluation  Patient identified by MRN, date of birth, ID band Patient awake    Reviewed: Allergy  & Precautions, NPO status , Patient's Chart, lab work & pertinent test results  Airway Mallampati: II  TM Distance: >3 FB Neck ROM: full    Dental  (+) Upper Dentures, Lower Dentures   Pulmonary neg pulmonary ROS, shortness of breath, COPD, former smoker   Pulmonary exam normal  + decreased breath sounds      Cardiovascular hypertension, Pt. on medications negative cardio ROS Normal cardiovascular exam Rhythm:Regular Rate:Normal     Neuro/Psych   Anxiety     negative neurological ROS  negative psych ROS   GI/Hepatic negative GI ROS, Neg liver ROS,,,  Endo/Other  negative endocrine ROS    Renal/GU negative Renal ROS  negative genitourinary   Musculoskeletal   Abdominal Normal abdominal exam  (+)   Peds negative pediatric ROS (+)  Hematology negative hematology ROS (+)   Anesthesia Other Findings Past Medical History: No date: Anxiety No date: Aortic valve sclerosis No date: Bilateral carotid bruits No date: Carotid artery stenosis No date: COPD (chronic obstructive pulmonary disease) (HCC) No date: Hypertension No date: Mitral regurgitation No date: Mixed hyperlipidemia No date: Shoulder pain No date: Sleeping difficulties  Past Surgical History: 08/13/2018: BREAST BIOPSY; Left     Comment:  affirm bx, x clip, path pending No date: CARDIAC CATHETERIZATION No date: CATARACT EXTRACTION; Left     Comment:  2017 No date: CHOLECYSTECTOMY 2012: COLONOSCOPY     Comment:  Dr Janine Melbourne 05/15/2021: COLONOSCOPY WITH PROPOFOL ; N/A     Comment:  Procedure: COLONOSCOPY WITH PROPOFOL ;  Surgeon: Luke Salaam, MD;  Location: [facility] ENDOSCOPY;  Service:               Gastroenterology;  Laterality: N/A; 07/03/2022: COLONOSCOPY WITH PROPOFOL ; N/A     Comment:  Procedure: COLONOSCOPY WITH PROPOFOL ;  Surgeon:  Luke Salaam, MD;  Location: [facility] ENDOSCOPY;  Service:               Gastroenterology;  Laterality: N/A; No date: EYE SURGERY 06/25/2017: LEFT HEART CATH AND CORONARY ANGIOGRAPHY; N/A     Comment:  Procedure: LEFT HEART CATH AND CORONARY ANGIOGRAPHY;                Surgeon: Sammy Crisp, MD;  Location: [facility] INVASIVE               CV LAB;  Service: Cardiovascular;  Laterality: N/A;  BMI    Body Mass Index: 25.06 kg/m      Reproductive/Obstetrics negative OB ROS                             Anesthesia Physical Anesthesia Plan  ASA: 3  Anesthesia Plan: General   Post-op Pain Management:    Induction: Intravenous  PONV Risk Score and Plan: Propofol  infusion and TIVA  Airway Management Planned: Natural Airway and Nasal Cannula  Additional Equipment:   Intra-op Plan:   Post-operative Plan:   Informed Consent: I have reviewed the patients History and Physical, chart, labs and discussed the procedure including the risks, benefits and alternatives for the proposed anesthesia with the patient or authorized representative who has indicated his/her understanding and acceptance.     Dental Advisory Given  Plan Discussed  with: CRNA  Anesthesia Plan Comments:        Anesthesia Quick Evaluation

## 2024-04-20 ENCOUNTER — Telehealth: Payer: Self-pay

## 2024-04-20 NOTE — Transitions of Care (Post Inpatient/ED Visit) (Signed)
   04/20/2024  Name: Kristin Parker MRN: [id number] DOB: 1951-04-17  Today's TOC FU Call Status: Today's TOC FU Call Status:: Successful TOC FU Call Completed TOC FU Call Complete Date: 04/20/24 Patient's Name and Date of Birth confirmed.  Transition Care Management Follow-up Telephone Call Date of Discharge: 04/17/24 Discharge Facility: [facility]) Type of Discharge: Inpatient Admission Primary Inpatient Discharge Diagnosis:: diarrhea How have you been since you were released from the hospital?: Same Any questions or concerns?: No  Items Reviewed: Did you receive and understand the discharge instructions provided?: Yes Medications obtained,verified, and reconciled?: Yes (Medications Reviewed) Any new allergies since your discharge?: No Dietary orders reviewed?: Yes Do you have support at home?: No  Medications Reviewed Today: Medications Reviewed Today   Medications were not reviewed in this encounter     Home Care and Equipment/Supplies: Were Home Health Services Ordered?: NA Any new equipment or medical supplies ordered?: NA  Functional Questionnaire: Do you need assistance with bathing/showering or dressing?: No Do you need assistance with meal preparation?: No Do you need assistance with eating?: No Do you have difficulty maintaining continence: No Do you need assistance with getting out of bed/getting out of a chair/moving?: No Do you have difficulty managing or taking your medications?: No  Follow up appointments reviewed: PCP Follow-up appointment confirmed?: Yes Date of PCP follow-up appointment?: 04/28/24 Follow-up Provider: [facility] Follow-up appointment confirmed?: No Reason Specialist Follow-Up Not Confirmed: [facility] Calling Clinician Notified Provider Practice of Needed Appointment Do you need transportation to your follow-up appointment?: No Do you understand care options if your condition(s) worsen?:  Yes-patient verbalized understanding    SIGNATURE Julian Lemmings, LPN [facility] Nurse Health Advisor Direct Dial [phone]

## 2024-04-21 ENCOUNTER — Encounter

## 2024-04-23 ENCOUNTER — Encounter

## 2024-04-23 ENCOUNTER — Telehealth: Payer: Self-pay | Admitting: Family Medicine

## 2024-04-23 NOTE — Telephone Encounter (Signed)
 LVM 04/23/2024 to schedule AWV. Please schedule office or virtual visits.  Kristin Parker; Care Guide Ambulatory Clinical Support [place] [facility] Direct Dial: [phone]

## 2024-04-28 ENCOUNTER — Encounter: Payer: Self-pay | Admitting: Family Medicine

## 2024-04-28 ENCOUNTER — Encounter

## 2024-04-28 ENCOUNTER — Ambulatory Visit (INDEPENDENT_AMBULATORY_CARE_PROVIDER_SITE_OTHER): Admitting: Family Medicine

## 2024-04-28 VITALS — BP 110/58 | HR 67 | Ht 67.0 in | Wt 158.2 lb

## 2024-04-28 DIAGNOSIS — G8929 Other chronic pain: Secondary | ICD-10-CM

## 2024-04-28 DIAGNOSIS — K219 Gastro-esophageal reflux disease without esophagitis: Secondary | ICD-10-CM | POA: Diagnosis not present

## 2024-04-28 DIAGNOSIS — M1712 Unilateral primary osteoarthritis, left knee: Secondary | ICD-10-CM | POA: Diagnosis not present

## 2024-04-28 DIAGNOSIS — M25562 Pain in left knee: Secondary | ICD-10-CM

## 2024-04-28 DIAGNOSIS — I10 Essential (primary) hypertension: Secondary | ICD-10-CM | POA: Diagnosis not present

## 2024-04-28 DIAGNOSIS — K2270 Barrett's esophagus without dysplasia: Secondary | ICD-10-CM

## 2024-04-28 MED ORDER — SUCRALFATE 1 G PO TABS: 1.0000 g | ORAL_TABLET | Freq: Three times a day (TID) | ORAL | 2 refills | Status: DC

## 2024-04-28 MED ORDER — OMEPRAZOLE 40 MG PO CPDR: 40.0000 mg | DELAYED_RELEASE_CAPSULE | Freq: Every day | ORAL | 2 refills | Status: DC

## 2024-04-28 NOTE — Progress Notes (Unsigned)
 Subjective:    Patient ID: Kristin Parker, female    DOB: 1950-12-04, 73 y.o.   MRN: [id number]  Kristin Parker is a 73 y.o. female presenting on 04/28/2024 for No chief complaint on file.   HPI  HOSPITAL FOLLOW-UP VISIT  Hospital/Location: [facility] Date of Admission: 04/16/24 Date of Discharge: 04/17/24 Transitions of care telephone call: Julian Lemmings LPN called on 3/76/74 successful TOC  Reason for Admission: Dehydration, nausea vomiting, diarrhea  - Hospital H&P and Discharge Summary have been reviewed - Patient presents today 11 days after recent hospitalization. Brief summary of recent course, patient had symptoms of *** for ***, hospitalized, *** treated with ***.  Hospital discontinued Meloxicam , and now has worsening arthritis L knee pain and Thumb joint.  - Today reports overall has ***done well after discharge. Symptoms of *** ***have resolved ***are much improved ***seem to be worsening again. - New medications on discharge: *** - Changes to current meds on discharge: ***  ***Left Knee X-ray  I have reviewed the discharge medication list, and have reconciled the current and discharge medications today.   Current Outpatient Medications:    acetaminophen  (TYLENOL ) 500 MG tablet, Take 2 tablets (1,000 mg total) by mouth every 6 (six) hours as needed for mild pain., Disp: , Rfl:    albuterol  (VENTOLIN  HFA) 108 (90 Base) MCG/ACT inhaler, INHALE 2 PUFFS EVERY 4 HOURS FOR WHEEZING OR SHORTNESS OF BREATH AS NEEDED, Disp: 18 g, Rfl: 3   amLODipine  (NORVASC ) 10 MG tablet, TAKE 1 TABLET BY MOUTH ONCE DAILY, Disp: 90 tablet, Rfl: 1   aspirin  EC 81 MG tablet, Take 1 tablet (81 mg total) by mouth daily., Disp: , Rfl:    busPIRone  (BUSPAR ) 5 MG tablet, Take 1 tablet (5 mg total) by mouth 2 (two) times daily., Disp: 180 tablet, Rfl: 3   estradiol  (ESTRACE ) 0.1 MG/GM vaginal cream, Apply 1 gram per vagina every night for 2 weeks, then apply three times a week, Disp: 42.5 g, Rfl: 5    famotidine  (PEPCID ) 20 MG tablet, TAKE 1 TABLET BY MOUTH TWICE DAILY, Disp: 180 tablet, Rfl: 3   fluticasone  (FLONASE ) 50 MCG/ACT nasal spray, USE 2 SPRAYS INTO EACH NOSTRIL ONCE DAILY, Disp: 48 g, Rfl: 0   Fluticasone -Umeclidin-Vilant (TRELEGY ELLIPTA ) 200-62.5-25 MCG/ACT AEPB, Inhale 1 Inhalation into the lungs daily., Disp: 30 each, Rfl: 12   hydrOXYzine  (VISTARIL ) 25 MG capsule, TAKE 1 CAPSULE BY MOUTH EVERY 8 HOURS ASNEEDED, Disp: 270 capsule, Rfl: 0   isosorbide  mononitrate (IMDUR ) 60 MG 24 hr tablet, TAKE 1 TABLET BY MOUTH ONCE DAILY, Disp: 30 tablet, Rfl: 11   losartan  (COZAAR ) 100 MG tablet, Take 1 tablet (100 mg total) by mouth daily., Disp: 90 tablet, Rfl: 1   omeprazole  (PRILOSEC ) 40 MG capsule, Take 1 capsule (40 mg total) by mouth daily before breakfast., Disp: 30 capsule, Rfl: 2   REPATHA  SURECLICK 140 MG/ML SOAJ, INJECT 1 DOSE INTO THE SKIN EVERY 14 DAYS, Disp: 6 mL, Rfl: 0   rizatriptan  (MAXALT -MLT) 10 MG disintegrating tablet, DISSOLVE 1 TABLET ON THE TONGUE AS NEEDED FOR MIGRAINE. MAY REPEAT IN 2 HOURS IF NEEDED, Disp: 10 tablet, Rfl: 2   sertraline  (ZOLOFT ) 100 MG tablet, TAKE 1 TABLET BY MOUTH ONCE DAILY, Disp: 90 tablet, Rfl: 1   sucralfate (CARAFATE) 1 g tablet, Take 1 tablet (1 g total) by mouth 4 (four) times daily -  with meals and at bedtime. As needed for indigestion, Disp: 40 tablet, Rfl: 2   WEGOVY  1  MG/0.5ML SOAJ, Inject 1 mg into the skin once a week., Disp: 2 mL, Rfl: 4   zolpidem  (AMBIEN  CR) 6.25 MG CR tablet, Take 1 tablet (6.25 mg total) by mouth at bedtime as needed. for sleep, Disp: 30 tablet, Rfl: 5  ------------------------------------------------------------------------- Social History   Tobacco Use   Smoking status: Former    Current packs/day: 0.00    Average packs/day: 0.5 packs/day for 30.0 years (15.0 ttl pk-yrs)    Types: Cigarettes    Start date: 10/30/1990    Quit date: 10/30/2020    Years since quitting: 3.4   Smokeless tobacco: Never   Tobacco  comments:    Quit Jan 2022  Vaping Use   Vaping status: Former  Substance Use Topics   Alcohol use: No    Alcohol/week: 0.0 standard drinks of alcohol   Drug use: No    Review of Systems Per HPI unless specifically indicated above     Objective:    BP (!) 110/58 (BP Location: Left Arm, Patient Position: Sitting, Cuff Size: Normal)   Pulse 67   Ht 5' 7 (1.702 m)   Wt 158 lb 4 oz (71.8 kg)   SpO2 96%   BMI 24.79 kg/m   Wt Readings from Last 3 Encounters:  04/28/24 158 lb 4 oz (71.8 kg)  04/16/24 160 lb (72.6 kg)  04/01/24 165 lb 4 oz (75 kg)    Physical Exam    Results for orders placed or performed during the hospital encounter of 04/16/24  Lipase, blood   Collection Time: 04/16/24 11:19 AM  Result Value Ref Range   Lipase 35 11 - 51 U/L  Comprehensive metabolic panel   Collection Time: 04/16/24 11:19 AM  Result Value Ref Range   Sodium 137 135 - 145 mmol/L   Potassium 4.6 3.5 - 5.1 mmol/L   Chloride 107 98 - 111 mmol/L   CO2 20 (L) 22 - 32 mmol/L   Glucose, Bld 104 (H) 70 - 99 mg/dL   BUN 23 8 - 23 mg/dL   Creatinine, Ser 8.93 (H) 0.44 - 1.00 mg/dL   Calcium  9.7 8.9 - 10.3 mg/dL   Total Protein 8.0 6.5 - 8.1 g/dL   Albumin 4.5 3.5 - 5.0 g/dL   AST 23 15 - 41 U/L   ALT 24 0 - 44 U/L   Alkaline Phosphatase 88 38 - 126 U/L   Total Bilirubin 0.7 0.0 - 1.2 mg/dL   GFR, Estimated 55 (L) >60 mL/min   Anion gap 10 5 - 15  CBC   Collection Time: 04/16/24 11:19 AM  Result Value Ref Range   WBC 11.9 (H) 4.0 - 10.5 K/uL   RBC 5.05 3.87 - 5.11 MIL/uL   Hemoglobin 15.8 (H) 12.0 - 15.0 g/dL   HCT 54.0 63.9 - 53.9 %   MCV 90.9 80.0 - 100.0 fL   MCH 31.3 26.0 - 34.0 pg   MCHC 34.4 30.0 - 36.0 g/dL   RDW 86.8 88.4 - 84.4 %   Platelets 293 150 - 400 K/uL   nRBC 0.0 0.0 - 0.2 %  Urinalysis, Routine w reflex microscopic -Urine, Clean Catch   Collection Time: 04/16/24  1:13 PM  Result Value Ref Range   Color, Urine YELLOW (A) YELLOW   APPearance CLOUDY (A) CLEAR    Specific Gravity, Urine 1.024 1.005 - 1.030   pH 5.0 5.0 - 8.0   Glucose, UA NEGATIVE NEGATIVE mg/dL   Hgb urine dipstick NEGATIVE NEGATIVE   Bilirubin Urine  NEGATIVE NEGATIVE   Ketones, ur NEGATIVE NEGATIVE mg/dL   Protein, ur NEGATIVE NEGATIVE mg/dL   Nitrite POSITIVE (A) NEGATIVE   Leukocytes,Ua SMALL (A) NEGATIVE   RBC / HPF 0-5 0 - 5 RBC/hpf   WBC, UA 6-10 0 - 5 WBC/hpf   Bacteria, UA FEW (A) NONE SEEN   Squamous Epithelial / HPF 6-10 0 - 5 /HPF   Mucus PRESENT    Hyaline Casts, UA PRESENT   Resp panel by RT-PCR (RSV, Flu A&B, Covid) Anterior Nasal Swab   Collection Time: 04/16/24  2:23 PM   Specimen: Anterior Nasal Swab  Result Value Ref Range   SARS Coronavirus 2 by RT PCR NEGATIVE NEGATIVE   Influenza A by PCR NEGATIVE NEGATIVE   Influenza B by PCR NEGATIVE NEGATIVE   Resp Syncytial Virus by PCR NEGATIVE NEGATIVE  Type and screen [facility]   Collection Time: 04/16/24  2:23 PM  Result Value Ref Range   ABO/RH(D) A POS    Antibody Screen NEG    Sample Expiration      04/19/2024,2359 Performed at [facility], [address] [place], [place] [address]   Troponin I (High Sensitivity)   Collection Time: 04/16/24  2:23 PM  Result Value Ref Range   Troponin I (High Sensitivity) 4 <18 ng/L  Troponin I (High Sensitivity)   Collection Time: 04/16/24  4:11 PM  Result Value Ref Range   Troponin I (High Sensitivity) 6 <18 ng/L  Basic metabolic panel   Collection Time: 04/17/24  2:43 AM  Result Value Ref Range   Sodium 141 135 - 145 mmol/L   Potassium 4.1 3.5 - 5.1 mmol/L   Chloride 111 98 - 111 mmol/L   CO2 24 22 - 32 mmol/L   Glucose, Bld 94 70 - 99 mg/dL   BUN 19 8 - 23 mg/dL   Creatinine, Ser 9.01 0.44 - 1.00 mg/dL   Calcium  8.8 (L) 8.9 - 10.3 mg/dL   GFR, Estimated >39 >39 mL/min   Anion gap 6 5 - 15  CBC   Collection Time: 04/17/24  2:43 AM  Result Value Ref Range   WBC 8.1 4.0 - 10.5 K/uL   RBC 4.50 3.87 - 5.11 MIL/uL    Hemoglobin 13.8 12.0 - 15.0 g/dL   HCT 58.8 63.9 - 53.9 %   MCV 91.3 80.0 - 100.0 fL   MCH 30.7 26.0 - 34.0 pg   MCHC 33.6 30.0 - 36.0 g/dL   RDW 86.8 88.4 - 84.4 %   Platelets 225 150 - 400 K/uL   nRBC 0.0 0.0 - 0.2 %      Assessment & Plan:   Problem List Items Addressed This Visit     Essential hypertension   Left knee pain   Other Visit Diagnoses       Gastroesophageal reflux disease without esophagitis    -  Primary   Relevant Medications   omeprazole  (PRILOSEC ) 40 MG capsule   sucralfate (CARAFATE) 1 g tablet     Barrett's esophagus without dysplasia       Relevant Medications   omeprazole  (PRILOSEC ) 40 MG capsule   sucralfate (CARAFATE) 1 g tablet     Primary osteoarthritis of left knee            Meds ordered this encounter  Medications   omeprazole  (PRILOSEC ) 40 MG capsule    Sig: Take 1 capsule (40 mg total) by mouth daily before breakfast.    Dispense:  30  capsule    Refill:  2   sucralfate (CARAFATE) 1 g tablet    Sig: Take 1 tablet (1 g total) by mouth 4 (four) times daily -  with meals and at bedtime. As needed for indigestion    Dispense:  40 tablet    Refill:  2    Follow up plan: No follow-ups on file.  Marsa Officer, DO [facility] 04/28/2024, 2:28 PM

## 2024-04-28 NOTE — Patient Instructions (Addendum)
 Thank you for coming to the office today.  Remain off Meloxicam   START anti inflammatory topical - OTC Voltaren (generic Diclofenac) topical 2-4 times a day as needed for pain swelling of affected joint for 1-2 weeks or longer.  Leg cramps - OTC natural option is Hyland's Leg Cramps (Dissolving tablet) take as needed for muscle cramps  BP is okay  Dose increase OMeprazole  from 20mg  up to 40mg . New rx  Carafate for as needed heartburn indigestion symptoms 3 to 4 times per day. With meals / bedtime.  We can refer to GI if not improving or if need further assistance.  If not improving on KNee, we can do an injection, please call or schedule  Please schedule a Follow-up Appointment to: Return if symptoms worsen or fail to improve.  If you have any other questions or concerns, please feel free to call the office or send a message through MyChart. You may also schedule an earlier appointment if necessary.  Additionally, you may be receiving a survey about your experience at our office within a few days to 1 week by e-mail or mail. We value your feedback.  Marsa Officer, DO [facility], [place]

## 2024-04-30 ENCOUNTER — Encounter

## 2024-05-05 ENCOUNTER — Encounter

## 2024-05-07 ENCOUNTER — Encounter

## 2024-05-12 ENCOUNTER — Encounter

## 2024-05-12 ENCOUNTER — Other Ambulatory Visit: Payer: Self-pay | Admitting: Family Medicine

## 2024-05-12 ENCOUNTER — Other Ambulatory Visit: Payer: Self-pay | Admitting: Student in an Organized Health Care Education/Training Program

## 2024-05-12 DIAGNOSIS — F419 Anxiety disorder, unspecified: Secondary | ICD-10-CM

## 2024-05-12 DIAGNOSIS — I10 Essential (primary) hypertension: Secondary | ICD-10-CM

## 2024-05-12 DIAGNOSIS — J439 Emphysema, unspecified: Secondary | ICD-10-CM

## 2024-05-12 DIAGNOSIS — L299 Pruritus, unspecified: Secondary | ICD-10-CM

## 2024-05-13 ENCOUNTER — Other Ambulatory Visit: Payer: Self-pay | Admitting: Family Medicine

## 2024-05-13 DIAGNOSIS — J439 Emphysema, unspecified: Secondary | ICD-10-CM

## 2024-05-13 NOTE — Telephone Encounter (Signed)
 Copied from CRM [id number]. Topic: Clinical - Medication Refill >> May 13, 2024  9:26 AM Rosaria E wrote: Medication: Fluticasone -Umeclidin-Vilant (TRELEGY ELLIPTA ) 200-62.5-25 MCG/ACT AEPB   Has the patient contacted their pharmacy? Yes (Agent: If no, request that the patient contact the pharmacy for the refill. If patient does not wish to contact the pharmacy document the reason why and proceed with request.) (Agent: If yes, when and what did the pharmacy advise?)  This is the patient's preferred pharmacy:  [facility] - [place], [place] - [address] [place] [address] Phone: [phone] Fax: [phone]  Is this the correct pharmacy for this prescription? Yes If no, delete pharmacy and type the correct one.   Has the prescription been filled recently? Yes  Is the patient out of the medication? Two days left   Has the patient been seen for an appointment in the last year OR does the patient have an upcoming appointment? Yes  Can we respond through MyChart? Yes  Agent: Please be advised that Rx refills may take up to 3 business days. We ask that you follow-up with your pharmacy.

## 2024-05-14 ENCOUNTER — Encounter

## 2024-05-14 NOTE — Telephone Encounter (Signed)
 Requested medication (s) are due for refill today: yes  Requested medication (s) are on the active medication list: yes  Last refill:  04/03/23  Future visit scheduled: no  Notes to clinic:  Unable to refill per protocol, last refill by another provider.      Requested Prescriptions  Pending Prescriptions Disp Refills   Fluticasone -Umeclidin-Vilant (TRELEGY ELLIPTA ) 200-62.5-25 MCG/ACT AEPB 30 each 12    Sig: Inhale 1 Inhalation into the lungs daily.     Off-Protocol Failed - 05/14/2024  3:16 PM      Failed - Medication not assigned to a protocol, review manually.      Passed - Valid encounter within last 12 months    Recent Outpatient Visits           2 weeks ago Gastroesophageal reflux disease without esophagitis   [place] [facility] [place], Marsa PARAS, DO   1 month ago Coronary artery disease of native artery of native heart with stable angina pectoris [facility])   [place] [facility] [place], Marsa PARAS, DO   4 months ago Atrophic vaginitis   [place] [facility] [place], Marsa PARAS, [place]

## 2024-05-14 NOTE — Telephone Encounter (Signed)
 Requested Prescriptions  Pending Prescriptions Disp Refills   amLODipine  (NORVASC ) 10 MG tablet [Pharmacy Med Name: AMLODIPINE  BESYLATE 10 MG TAB] 90 tablet 1    Sig: TAKE 1 TABLET BY MOUTH ONCE DAILY     Cardiovascular: Calcium  Channel Blockers 2 Passed - 05/14/2024 10:47 AM      Passed - Last BP in normal range    BP Readings from Last 1 Encounters:  04/28/24 (!) 110/58         Passed - Last Heart Rate in normal range    Pulse Readings from Last 1 Encounters:  04/28/24 67         Passed - Valid encounter within last 6 months    Recent Outpatient Visits           2 weeks ago Gastroesophageal reflux disease without esophagitis   [place] [facility] [place], Marsa PARAS, DO   1 month ago Coronary artery disease of native artery of native heart with stable angina pectoris [facility])   [facility] [place], Marsa PARAS, DO   4 months ago Atrophic vaginitis   [facility] [place], Marsa PARAS, DO               sertraline  (ZOLOFT ) 100 MG tablet [Pharmacy Med Name: SERTRALINE  HCL 100 MG TAB] 90 tablet 1    Sig: TAKE 1 TABLET BY MOUTH ONCE DAILY     Psychiatry:  Antidepressants - SSRI - sertraline  Passed - 05/14/2024 10:47 AM      Passed - AST in normal range and within 360 days    AST  Date Value Ref Range Status  04/16/2024 23 15 - 41 U/L Final         Passed - ALT in normal range and within 360 days    ALT  Date Value Ref Range Status  04/16/2024 24 0 - 44 U/L Final         Passed - Completed PHQ-2 or PHQ-9 in the last 360 days      Passed - Valid encounter within last 6 months    Recent Outpatient Visits           2 weeks ago Gastroesophageal reflux disease without esophagitis   [place] [facility] [place], Marsa PARAS, DO   1 month ago Coronary artery disease of native artery of native heart with stable angina pectoris [facility])   [place] [facility] [place], Marsa PARAS, DO   4 months ago Atrophic vaginitis   [facility] [place], Marsa PARAS, DO               busPIRone  (BUSPAR ) 5 MG tablet [Pharmacy Med Name: BUSPIRONE  HCL 5 MG TAB] 180 tablet 1    Sig: TAKE 1 TABLET BY MOUTH TWICE DAILY     Psychiatry: Anxiolytics/Hypnotics - Non-controlled Passed - 05/14/2024 10:47 AM      Passed - Valid encounter within last 12 months    Recent Outpatient Visits           2 weeks ago Gastroesophageal reflux disease without esophagitis   [place] [facility] [place], Marsa PARAS, DO   1 month ago Coronary artery disease of native artery of native heart with stable angina pectoris [facility])   [facility] [place], Marsa PARAS, DO   4 months ago Atrophic vaginitis    [facility] [place],  Marsa PARAS, DO               hydrOXYzine  (VISTARIL ) 25 MG capsule [Pharmacy Med Name: HYDROXYZINE  PAMOATE 25 MG CAP] 270 capsule 0    Sig: TAKE 1 CAPSULE BY MOUTH EVERY 8 HOURS ASNEEDED     Ear, Nose, and Throat:  Antihistamines 2 Passed - 05/14/2024 10:47 AM      Passed - Cr in normal range and within 360 days    Creat  Date Value Ref Range Status  09/11/2023 1.03 (H) 0.60 - 1.00 mg/dL Final   Creatinine, Ser  Date Value Ref Range Status  04/17/2024 0.98 0.44 - 1.00 mg/dL Final         Passed - Valid encounter within last 12 months    Recent Outpatient Visits           2 weeks ago Gastroesophageal reflux disease without esophagitis   [place] [facility] [place], Marsa PARAS, DO   1 month ago Coronary artery disease of native artery of native heart with stable angina pectoris [facility])   [place] [facility] [place], Marsa PARAS, DO   4 months ago Atrophic vaginitis   [facility] [place], Marsa PARAS, [place]

## 2024-05-18 ENCOUNTER — Other Ambulatory Visit: Payer: Self-pay | Admitting: Student in an Organized Health Care Education/Training Program

## 2024-05-18 DIAGNOSIS — J439 Emphysema, unspecified: Secondary | ICD-10-CM

## 2024-05-26 ENCOUNTER — Other Ambulatory Visit: Payer: Self-pay | Admitting: Internal Medicine

## 2024-05-26 DIAGNOSIS — I25118 Atherosclerotic heart disease of native coronary artery with other forms of angina pectoris: Secondary | ICD-10-CM

## 2024-05-26 DIAGNOSIS — E785 Hyperlipidemia, unspecified: Secondary | ICD-10-CM

## 2024-07-03 ENCOUNTER — Ambulatory Visit: Admitting: Family Medicine

## 2024-07-06 ENCOUNTER — Ambulatory Visit (INDEPENDENT_AMBULATORY_CARE_PROVIDER_SITE_OTHER): Admitting: Family Medicine

## 2024-07-06 ENCOUNTER — Encounter: Payer: Self-pay | Admitting: Family Medicine

## 2024-07-06 VITALS — BP 118/68 | HR 70 | Ht 67.0 in | Wt 151.1 lb

## 2024-07-06 DIAGNOSIS — G8929 Other chronic pain: Secondary | ICD-10-CM

## 2024-07-06 DIAGNOSIS — M1712 Unilateral primary osteoarthritis, left knee: Secondary | ICD-10-CM

## 2024-07-06 DIAGNOSIS — M25562 Pain in left knee: Secondary | ICD-10-CM

## 2024-07-06 DIAGNOSIS — M545 Low back pain, unspecified: Secondary | ICD-10-CM | POA: Diagnosis not present

## 2024-07-06 MED ORDER — GABAPENTIN 100 MG PO CAPS: ORAL_CAPSULE | ORAL | 1 refills | Status: DC

## 2024-07-06 MED ORDER — METHYLPREDNISOLONE ACETATE 40 MG/ML IJ SUSP
40.0000 mg | Freq: Once | INTRAMUSCULAR | Status: AC
  Administered 2024-07-06: 40 mg via INTRA_ARTICULAR

## 2024-07-06 MED ORDER — LIDOCAINE HCL (PF) 1 % IJ SOLN
4.0000 mL | Freq: Once | INTRAMUSCULAR | Status: AC
  Administered 2024-07-06: 4 mL

## 2024-07-06 NOTE — Patient Instructions (Addendum)
 Thank you for coming to the office today.  You received a Left Knee Joint steroid injection today. - Lidocaine  numbing medicine may ease the pain initially for a few hours until it wears off - As discussed, you may experience a steroid flare this evening or within 24-48 hours, anytime medicine is injected into an inflamed joint it can cause the pain to get worse temporarily - Everyone responds differently to these injections, it depends on the patient and the severity of the joint problem, it may provide anywhere from days to weeks, to months of relief. Ideal response is >6 months relief - Try to take it easy for next 1-2 days, avoid over activity and strain on joint (limit walking for knee or lifting for shoulder) - Recommend the following:   - For swelling - rest, compression sleeve / ACE wrap, elevation, and ice packs as needed for first few days   - For pain in future may use heating pad or moist heat as needed  X-rays available for Left Knee and Hip/Back if not improved, walk in whenever.  May need to refer to Orthopedic or Sports in future.  Start Gabapentin  100mg  capsules, take at night for 2-3 nights only, and then increase to 2 times a day for a few days, and then may increase to 3 times a day, it may make you drowsy, if helps significantly at night only, then you can increase instead to 3 capsules at night, instead of 3 times a day - In the future if needed, we can significantly increase the dose if tolerated well, some common doses are 300mg  three times a day up to 600mg  three times a day, usually it takes several weeks or months to get to higher doses    Please schedule a Follow-up Appointment to: Return if symptoms worsen or fail to improve.  If you have any other questions or concerns, please feel free to call the office or send a message through MyChart. You may also schedule an earlier appointment if necessary.  Additionally, you may be receiving a survey about your  experience at our office within a few days to 1 week by e-mail or mail. We value your feedback.  Marsa Officer, DO [facility], [place]

## 2024-07-06 NOTE — Progress Notes (Signed)
 Subjective:    Patient ID: Kristin Parker, female    DOB: Feb 11, 1951, 73 y.o.   MRN: [id number]  Kristin Parker is a 73 y.o. female presenting on 07/06/2024 for Knee Pain (Left )   HPI  Discussed the use of AI scribe software for clinical note transcription with the patient, who gave verbal consent to proceed.  History of Present Illness   Kristin Parker is a 73 year old female with left knee arthritis who presents with left leg pain and stiffness.  Left lower extremity pain and stiffness - Persistent pain and stiffness in the left leg, extending from the hip and buttock area down to the two middle toes - Pain primarily located on the lateral aspect of the knee, radiating down to the ankle - Describes a 'snap, crackle, pop' sound with ambulation - Pain severity can be intense enough to 'take my breath and about make me fall to the floor at times' - Tightness in the muscles and tendons around the knee, with difficulty straightening the leg due to pulling and pain - Morning stiffness improves slightly with movement but worsens by lunchtime - Pain and stiffness impair ambulation, requiring her to get in and out of vehicles sideways - Unable to walk outside as she enjoys due to pain - Pain impacts daily activities and sleep  History of left knee osteoarthritis - History of left knee arthritis - Received a knee injection in 2017 with temporary relief - No knee x-ray since 2017  Recent trauma - Fell into a hole approximately three weeks ago, possibly exacerbating symptoms - Uncertain which leg was affected by the fall  Response to treatments - Alternates Tylenol  and ibuprofen  without effective relief - Previously used meloxicam , no longer taking it - Has tried topical treatments including Voltaren and Biofreeze, with only temporary or minimal relief - Has not used muscle relaxants recently; prior use did not provide benefit          04/01/2024   10:48 AM 12/26/2023    4:18 PM  02/07/2023    3:04 PM  Depression screen PHQ 2/9  Decreased Interest 0 1 0  Down, Depressed, Hopeless 0 1 1  PHQ - 2 Score 0 2 1  Altered sleeping  3 1  Tired, decreased energy  1 1  Change in appetite  0 0  Feeling bad or failure about yourself   2 0  Trouble concentrating  0 1  Moving slowly or fidgety/restless  1 0  Suicidal thoughts  0 0  PHQ-9 Score  9 4  Difficult doing work/chores  Somewhat difficult Not difficult at all       04/01/2024   10:48 AM 09/11/2021    2:19 PM 03/22/2021    9:34 AM 03/08/2021    1:38 PM  GAD 7 : Generalized Anxiety Score  Nervous, Anxious, on Edge 0 1 3 1   Control/stop worrying 0 1 3 0  Worry too much - different things 0 1 3 1   Trouble relaxing 0 1 3 0  Restless 0 1 0 0  Easily annoyed or irritable 0 1 3 1   Afraid - awful might happen 0 1 3 1   Total GAD 7 Score 0 7 18 4   Anxiety Difficulty  Not difficult at all Not difficult at all Not difficult at all    Social History   Tobacco Use   Smoking status: Former    Current packs/day: 0.00    Average packs/day: 0.5 packs/day for  30.0 years (15.0 ttl pk-yrs)    Types: Cigarettes    Start date: 10/30/1990    Quit date: 10/30/2020    Years since quitting: 3.6   Smokeless tobacco: Never   Tobacco comments:    Quit Jan 2022  Vaping Use   Vaping status: Former  Substance Use Topics   Alcohol use: No    Alcohol/week: 0.0 standard drinks of alcohol   Drug use: No    Review of Systems Per HPI unless specifically indicated above     Objective:    BP 118/68 (BP Location: Right Arm, Patient Position: Sitting, Cuff Size: Normal)   Pulse 70   Ht 5' 7 (1.702 m)   Wt 151 lb 2 oz (68.5 kg)   SpO2 94%   BMI 23.67 kg/m   Wt Readings from Last 3 Encounters:  07/06/24 151 lb 2 oz (68.5 kg)  04/28/24 158 lb 4 oz (71.8 kg)  04/16/24 160 lb (72.6 kg)    Physical Exam Vitals and nursing note reviewed.  Constitutional:      General: She is not in acute distress.    Appearance: Normal  appearance. She is well-developed. She is not diaphoretic.     Comments: Well-appearing, comfortable, cooperative  HENT:     Head: Normocephalic and atraumatic.  Eyes:     General:        Right eye: No discharge.        Left eye: No discharge.     Conjunctiva/sclera: Conjunctivae normal.  Cardiovascular:     Rate and Rhythm: Normal rate.  Pulmonary:     Effort: Pulmonary effort is normal.  Musculoskeletal:     Comments: Left Knee Inspection: Bulky appearance left knee Palpation: Non-tender. Mild +TTP Left knee only medial joint line. Crepitus on movements. - Very tight muscle tendons and quad muscles lower extremity on left side ROM: Full active ROM bilaterally Special Testing: Lachman / Valgus/Varus tests negative with intact ligaments (ACL, MCL, LCL).  Standing Thessaly negative for meniscus - Supine range motion for knee and hip shows intact range for knee flex / extend and intact internal and external rotation of Left hip Strength: 5/5 intact knee flex/ext, ankle dorsi/plantarflex Neurovascular: distally intact sensation light touch and pulses   Skin:    General: Skin is warm and dry.     Findings: No erythema or rash.  Neurological:     Mental Status: She is alert and oriented to person, place, and time.  Psychiatric:        Mood and Affect: Mood normal.        Behavior: Behavior normal.        Thought Content: Thought content normal.     Comments: Well groomed, good eye contact, normal speech and thoughts      ________________________________________________________ PROCEDURE NOTE Date: 07/06/24 Left Knee Joint injection Discussed benefits and risks (including pain, bleeding, infection, steroid flare). Verbal consent given by patient. Medication:  1 cc Depo-medrol  40mg  and 4 cc Lidocaine  1% without epi Time Out taken  Landmarks identified. Area cleansed with alcohol wipes. Using 21 gauge and 1, 1/2 inch needle, Left knee lateral joint space was injected (with above  listed medication) via lateral approach cold spray used for superficial anesthetic. Sterile bandage placed. Patient tolerated procedure well without bleeding or paresthesias. No complications.   I have personally reviewed the radiology report from 06/12/16 on L Knee X-ray.  CLINICAL DATA:  Left knee pain and swelling intermittently over the last month, no acute injury  EXAM: LEFT KNEE - COMPLETE 4+ VIEW   COMPARISON:  None.   FINDINGS: There is mild unicompartmental degenerative joint disease of the left knee involving the medial compartment. There is loss of joint space with spurring present medially. The lateral and patellofemoral compartments are relatively well preserved. No fracture is seen and no joint effusion is noted. The patella is normally position.   IMPRESSION: Mild unicompartmental degenerative joint disease the left knee involving the medial compartment     Electronically Signed   By: Deward Dames M.D.   On: 06/12/2016 12:17   Results for orders placed or performed during the hospital encounter of 04/16/24  Lipase, blood   Collection Time: 04/16/24 11:19 AM  Result Value Ref Range   Lipase 35 11 - 51 U/L  Comprehensive metabolic panel   Collection Time: 04/16/24 11:19 AM  Result Value Ref Range   Sodium 137 135 - 145 mmol/L   Potassium 4.6 3.5 - 5.1 mmol/L   Chloride 107 98 - 111 mmol/L   CO2 20 (L) 22 - 32 mmol/L   Glucose, Bld 104 (H) 70 - 99 mg/dL   BUN 23 8 - 23 mg/dL   Creatinine, Ser 8.93 (H) 0.44 - 1.00 mg/dL   Calcium  9.7 8.9 - 10.3 mg/dL   Total Protein 8.0 6.5 - 8.1 g/dL   Albumin 4.5 3.5 - 5.0 g/dL   AST 23 15 - 41 U/L   ALT 24 0 - 44 U/L   Alkaline Phosphatase 88 38 - 126 U/L   Total Bilirubin 0.7 0.0 - 1.2 mg/dL   GFR, Estimated 55 (L) >60 mL/min   Anion gap 10 5 - 15  CBC   Collection Time: 04/16/24 11:19 AM  Result Value Ref Range   WBC 11.9 (H) 4.0 - 10.5 K/uL   RBC 5.05 3.87 - 5.11 MIL/uL   Hemoglobin 15.8 (H) 12.0 - 15.0 g/dL    HCT 54.0 63.9 - 53.9 %   MCV 90.9 80.0 - 100.0 fL   MCH 31.3 26.0 - 34.0 pg   MCHC 34.4 30.0 - 36.0 g/dL   RDW 86.8 88.4 - 84.4 %   Platelets 293 150 - 400 K/uL   nRBC 0.0 0.0 - 0.2 %  Urinalysis, Routine w reflex microscopic -Urine, Clean Catch   Collection Time: 04/16/24  1:13 PM  Result Value Ref Range   Color, Urine YELLOW (A) YELLOW   APPearance CLOUDY (A) CLEAR   Specific Gravity, Urine 1.024 1.005 - 1.030   pH 5.0 5.0 - 8.0   Glucose, UA NEGATIVE NEGATIVE mg/dL   Hgb urine dipstick NEGATIVE NEGATIVE   Bilirubin Urine NEGATIVE NEGATIVE   Ketones, ur NEGATIVE NEGATIVE mg/dL   Protein, ur NEGATIVE NEGATIVE mg/dL   Nitrite POSITIVE (A) NEGATIVE   Leukocytes,Ua SMALL (A) NEGATIVE   RBC / HPF 0-5 0 - 5 RBC/hpf   WBC, UA 6-10 0 - 5 WBC/hpf   Bacteria, UA FEW (A) NONE SEEN   Squamous Epithelial / HPF 6-10 0 - 5 /HPF   Mucus PRESENT    Hyaline Casts, UA PRESENT   Resp panel by RT-PCR (RSV, Flu A&B, Covid) Anterior Nasal Swab   Collection Time: 04/16/24  2:23 PM   Specimen: Anterior Nasal Swab  Result Value Ref Range   SARS Coronavirus 2 by RT PCR NEGATIVE NEGATIVE   Influenza A by PCR NEGATIVE NEGATIVE   Influenza B by PCR NEGATIVE NEGATIVE   Resp Syncytial Virus by PCR NEGATIVE NEGATIVE  Type and screen  [facility]   Collection Time: 04/16/24  2:23 PM  Result Value Ref Range   ABO/RH(D) A POS    Antibody Screen NEG    Sample Expiration      04/19/2024,2359 Performed at [facility], [address] [place], [place] [address]   Troponin I (High Sensitivity)   Collection Time: 04/16/24  2:23 PM  Result Value Ref Range   Troponin I (High Sensitivity) 4 <18 ng/L  Troponin I (High Sensitivity)   Collection Time: 04/16/24  4:11 PM  Result Value Ref Range   Troponin I (High Sensitivity) 6 <18 ng/L  Basic metabolic panel   Collection Time: 04/17/24  2:43 AM  Result Value Ref Range   Sodium 141 135 - 145 mmol/L   Potassium 4.1 3.5 - 5.1  mmol/L   Chloride 111 98 - 111 mmol/L   CO2 24 22 - 32 mmol/L   Glucose, Bld 94 70 - 99 mg/dL   BUN 19 8 - 23 mg/dL   Creatinine, Ser 9.01 0.44 - 1.00 mg/dL   Calcium  8.8 (L) 8.9 - 10.3 mg/dL   GFR, Estimated >39 >39 mL/min   Anion gap 6 5 - 15  CBC   Collection Time: 04/17/24  2:43 AM  Result Value Ref Range   WBC 8.1 4.0 - 10.5 K/uL   RBC 4.50 3.87 - 5.11 MIL/uL   Hemoglobin 13.8 12.0 - 15.0 g/dL   HCT 58.8 63.9 - 53.9 %   MCV 91.3 80.0 - 100.0 fL   MCH 30.7 26.0 - 34.0 pg   MCHC 33.6 30.0 - 36.0 g/dL   RDW 86.8 88.4 - 84.4 %   Platelets 225 150 - 400 K/uL   nRBC 0.0 0.0 - 0.2 %      Assessment & Plan:   Problem List Items Addressed This Visit     Left knee pain   Relevant Medications   gabapentin  (NEURONTIN ) 100 MG capsule   Other Relevant Orders   DG Knee Complete 4 Views Left   Other Visit Diagnoses       Primary osteoarthritis of left knee    -  Primary   Relevant Medications   lidocaine  (PF) (XYLOCAINE ) 1 % injection 4 mL (Completed)   methylPREDNISolone  acetate (DEPO-MEDROL ) injection 40 mg (Completed)   gabapentin  (NEURONTIN ) 100 MG capsule   Other Relevant Orders   DG Knee Complete 4 Views Left     Chronic bilateral low back pain without sciatica       Relevant Medications   lidocaine  (PF) (XYLOCAINE ) 1 % injection 4 mL (Completed)   methylPREDNISolone  acetate (DEPO-MEDROL ) injection 40 mg (Completed)   gabapentin  (NEURONTIN ) 100 MG capsule   Other Relevant Orders   DG Lumbar Spine Complete       Left knee osteoarthritis with associated pain and left lower extremity muscle/tendon spasm Chronic osteoarthritis with pain and muscle spasm. Symptoms suggest possible progression and muscle overuse. Differential includes sciatic nerve involvement.  Prior history of joint injection >10 years ago uncertain exact timing  - Administer left knee joint injection, noting risks of temporary pain flare-up and potential relief. - Discuss physical therapy and  muscle relaxants for spasm management. - Prescribe gabapentin  for nighttime pain. - Consider further imaging, including x-rays for knee , and back if no improvement. - Discuss potential need for MRI if symptoms persist. - Consider referral to orthopedic or sports medicine specialist if no improvement. - Discussed chiropractic care may assist with back alignment but not muscle  issues.      Future X-rays ordered, walk in when ready Lumbar Spine + Left Knee   Orders Placed This Encounter  Procedures   DG Knee Complete 4 Views Left    Standing Status:   Future    Expiration Date:   07/06/2025    Reason for Exam (SYMPTOM  OR DIAGNOSIS REQUIRED):   chronic left knee arthritis pain    Preferred imaging location?:   [facility] BARE Lumbar Spine Complete    Standing Status:   Future    Expiration Date:   07/06/2025    Reason for Exam (SYMPTOM  OR DIAGNOSIS REQUIRED):   chronic low back pain with sciatica    Preferred imaging location?:   [facility]    Meds ordered this encounter  Medications   lidocaine  (PF) (XYLOCAINE ) 1 % injection 4 mL   methylPREDNISolone  acetate (DEPO-MEDROL ) injection 40 mg   gabapentin  (NEURONTIN ) 100 MG capsule    Sig: Start 1 capsule daily, increase by 1 cap every 2-3 days as tolerated up to 3 times a day, or may take 3 at once in evening.    Dispense:  90 capsule    Refill:  1    Follow up plan: Return if symptoms worsen or fail to improve.   Marsa Officer, DO [facility] 07/06/2024, 10:07 AM

## 2024-07-11 ENCOUNTER — Other Ambulatory Visit: Payer: Self-pay | Admitting: Family Medicine

## 2024-07-11 DIAGNOSIS — I10 Essential (primary) hypertension: Secondary | ICD-10-CM

## 2024-07-13 NOTE — Telephone Encounter (Signed)
 Requested Prescriptions  Pending Prescriptions Disp Refills   losartan  (COZAAR ) 100 MG tablet [Pharmacy Med Name: LOSARTAN  POTASSIUM 100 MG TAB] 90 tablet 0    Sig: TAKE 1 TABLET BY MOUTH ONCE DAILY     Cardiovascular:  Angiotensin Receptor Blockers Passed - 07/13/2024  4:23 PM      Passed - Cr in normal range and within 180 days    Creat  Date Value Ref Range Status  09/11/2023 1.03 (H) 0.60 - 1.00 mg/dL Final   Creatinine, Ser  Date Value Ref Range Status  04/17/2024 0.98 0.44 - 1.00 mg/dL Final         Passed - K in normal range and within 180 days    Potassium  Date Value Ref Range Status  04/17/2024 4.1 3.5 - 5.1 mmol/L Final         Passed - Patient is not pregnant      Passed - Last BP in normal range    BP Readings from Last 1 Encounters:  07/06/24 118/68         Passed - Valid encounter within last 6 months    Recent Outpatient Visits           1 week ago Primary osteoarthritis of left knee   [place] [facility] [place] Marsa PARAS, DO   2 months ago Gastroesophageal reflux disease without esophagitis   [place] [facility] [place], Marsa PARAS, DO   3 months ago Coronary artery disease of native artery of native heart with stable angina pectoris [facility])   [place] [facility] [place], Marsa PARAS, DO   6 months ago Atrophic vaginitis   [place] [facility] [place], Marsa PARAS, [place]

## 2024-07-27 ENCOUNTER — Encounter: Payer: Self-pay | Admitting: Pharmacist

## 2024-07-27 NOTE — Progress Notes (Signed)
   07/27/2024  Patient ID: Kristin Parker, female   DOB: [birth year], 73 y.o.   MRN: [id number]  This patient is appearing on report for being at risk of failing the measure for Statin Therapy for Patients with Cardiovascular Disease [facility]) this calendar year.   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    Note patient currently taking Repatha  140 mg injection every 14 days  Patient scheduled for upcoming appointment with PCP on 09/09/2024  Plan: Will ask PCP to consider, if appropriate, to include code for past statin intolerance (required annually) with upcoming Office Visit   Sharyle Sia, PharmD, [facility] [phone]

## 2024-08-18 ENCOUNTER — Other Ambulatory Visit: Payer: Self-pay | Admitting: Family Medicine

## 2024-08-18 DIAGNOSIS — I25118 Atherosclerotic heart disease of native coronary artery with other forms of angina pectoris: Secondary | ICD-10-CM

## 2024-08-20 NOTE — Telephone Encounter (Signed)
 Requested medications are due for refill today.  yes  Requested medications are on the active medications list.  yes  Last refill. 04/02/2023 2mL 4 rf  Future visit scheduled.   yes  Notes to clinic.  Labs are expired    Requested Prescriptions  Pending Prescriptions Disp Refills   WEGOVY  1 MG/0.5ML SOAJ SQ injection [Pharmacy Med Name: WEGOVY  1 MG/0.5ML SUBQ SOLN ML] 2 mL 4    Sig: INJECT 1MG  SUBCUTANEOUSLY ONCE A WEEK     Endocrinology:  Diabetes - GLP-1 Receptor Agonists - semaglutide  Failed - 08/20/2024 10:16 AM      Failed - HBA1C in normal range and within 180 days    Hgb A1c MFr Bld  Date Value Ref Range Status  09/11/2023 5.8 (H) <5.7 % of total Hgb Final    Comment:    For someone without known diabetes, a hemoglobin  A1c value between 5.7% and 6.4% is consistent with prediabetes and should be confirmed with a  follow-up test. . For someone with known diabetes, a value <7% indicates that their diabetes is well controlled. A1c targets should be individualized based on duration of diabetes, age, comorbid conditions, and other considerations. . This assay result is consistent with an increased risk of diabetes. . Currently, no consensus exists regarding use of hemoglobin A1c for diagnosis of diabetes for children. .          Passed - Cr in normal range and within 360 days    Creat  Date Value Ref Range Status  09/11/2023 1.03 (H) 0.60 - 1.00 mg/dL Final   Creatinine, Ser  Date Value Ref Range Status  04/17/2024 0.98 0.44 - 1.00 mg/dL Final         Passed - Valid encounter within last 6 months    Recent Outpatient Visits           1 month ago Primary osteoarthritis of left knee   [place] [facility] [place], Marsa PARAS, DO   3 months ago Gastroesophageal reflux disease without esophagitis   [facility] [place], Marsa PARAS, DO   4 months ago Coronary artery disease of native artery of native  heart with stable angina pectoris   [facility] [place] Marsa PARAS, DO   8 months ago Atrophic vaginitis   [facility] [place], Marsa PARAS, [place]

## 2024-08-28 ENCOUNTER — Other Ambulatory Visit: Payer: Self-pay | Admitting: Family Medicine

## 2024-08-28 DIAGNOSIS — F419 Anxiety disorder, unspecified: Secondary | ICD-10-CM

## 2024-08-28 DIAGNOSIS — L299 Pruritus, unspecified: Secondary | ICD-10-CM

## 2024-08-31 NOTE — Telephone Encounter (Signed)
 Requested Prescriptions  Pending Prescriptions Disp Refills   busPIRone  (BUSPAR ) 5 MG tablet [Pharmacy Med Name: BUSPIRONE  HCL 5 MG TAB] 180 tablet 0    Sig: TAKE 1 TABLET BY MOUTH TWICE DAILY     Psychiatry: Anxiolytics/Hypnotics - Non-controlled Passed - 08/31/2024 12:11 PM      Passed - Valid encounter within last 12 months    Recent Outpatient Visits           1 month ago Primary osteoarthritis of left knee   [place] [facility] [place], Marsa PARAS, DO   4 months ago Gastroesophageal reflux disease without esophagitis   [place] [place], Marsa PARAS, DO   5 months ago Coronary artery disease of native artery of native heart with stable angina pectoris   [place] [facility] [place] Marsa PARAS, DO   8 months ago Atrophic vaginitis   [place] [facility] [place], Marsa PARAS, DO               hydrOXYzine  (VISTARIL ) 25 MG capsule [Pharmacy Med Name: HYDROXYZINE  PAMOATE 25 MG CAP] 270 capsule 0    Sig: TAKE 1 CAPSULE BY MOUTH EVERY 8 HOURS ASNEEDED     Ear, Nose, and Throat:  Antihistamines 2 Passed - 08/31/2024 12:11 PM      Passed - Cr in normal range and within 360 days    Creat  Date Value Ref Range Status  09/11/2023 1.03 (H) 0.60 - 1.00 mg/dL Final   Creatinine, Ser  Date Value Ref Range Status  04/17/2024 0.98 0.44 - 1.00 mg/dL Final         Passed - Valid encounter within last 12 months    Recent Outpatient Visits           1 month ago Primary osteoarthritis of left knee   [facility] [place], Marsa PARAS, DO   4 months ago Gastroesophageal reflux disease without esophagitis   [place] [facility] [place], Marsa PARAS, DO   5 months ago Coronary artery disease of native artery of native heart with stable angina pectoris   [facility] [place] Marsa PARAS, DO   8 months  ago Atrophic vaginitis   [place] [facility] [place], Marsa PARAS, [place]

## 2024-09-02 ENCOUNTER — Other Ambulatory Visit

## 2024-09-02 DIAGNOSIS — R7309 Other abnormal glucose: Secondary | ICD-10-CM | POA: Diagnosis not present

## 2024-09-02 DIAGNOSIS — E782 Mixed hyperlipidemia: Secondary | ICD-10-CM

## 2024-09-02 DIAGNOSIS — E559 Vitamin D deficiency, unspecified: Secondary | ICD-10-CM | POA: Diagnosis not present

## 2024-09-02 DIAGNOSIS — I25118 Atherosclerotic heart disease of native coronary artery with other forms of angina pectoris: Secondary | ICD-10-CM

## 2024-09-02 DIAGNOSIS — Z0000 Encounter for general adult medical examination without abnormal findings: Secondary | ICD-10-CM

## 2024-09-02 DIAGNOSIS — I10 Essential (primary) hypertension: Secondary | ICD-10-CM

## 2024-09-03 LAB — COMPREHENSIVE METABOLIC PANEL WITH GFR
AG Ratio: 1.6 (calc) (ref 1.0–2.5)
ALT: 10 U/L (ref 6–29)
AST: 13 U/L (ref 10–35)
Albumin: 4.1 g/dL (ref 3.6–5.1)
Alkaline phosphatase (APISO): 108 U/L (ref 37–153)
BUN: 14 mg/dL (ref 7–25)
CO2: 27 mmol/L (ref 20–32)
Calcium: 9 mg/dL (ref 8.6–10.4)
Chloride: 104 mmol/L (ref 98–110)
Creat: 0.81 mg/dL (ref 0.60–1.00)
Globulin: 2.6 g/dL (ref 1.9–3.7)
Glucose, Bld: 97 mg/dL (ref 65–99)
Potassium: 3.3 mmol/L — ABNORMAL LOW (ref 3.5–5.3)
Sodium: 141 mmol/L (ref 135–146)
Total Bilirubin: 0.3 mg/dL (ref 0.2–1.2)
Total Protein: 6.7 g/dL (ref 6.1–8.1)
eGFR: 76 mL/min/1.73m2 (ref >=60)

## 2024-09-03 LAB — CBC WITH DIFFERENTIAL/PLATELET
Absolute Lymphocytes: 2726 {cells}/uL (ref 850–3900)
Absolute Monocytes: 542 {cells}/uL (ref 200–950)
Basophils Absolute: 60 {cells}/uL (ref 0–200)
Basophils Relative: 0.7 %
Eosinophils Absolute: 120 {cells}/uL (ref 15–500)
Eosinophils Relative: 1.4 %
HCT: 41.7 % (ref 35.0–45.0)
Hemoglobin: 14.1 g/dL (ref 11.7–15.5)
MCH: 30.9 pg (ref 27.0–33.0)
MCHC: 33.8 g/dL (ref 32.0–36.0)
MCV: 91.2 fL (ref 80.0–100.0)
MPV: 9.5 fL (ref 7.5–12.5)
Monocytes Relative: 6.3 %
Neutro Abs: 5151 {cells}/uL (ref 1500–7800)
Neutrophils Relative %: 59.9 %
Platelets: 299 Thousand/uL (ref 140–400)
RBC: 4.57 Million/uL (ref 3.80–5.10)
RDW: 13.1 % (ref 11.0–15.0)
Total Lymphocyte: 31.7 %
WBC: 8.6 Thousand/uL (ref 3.8–10.8)

## 2024-09-03 LAB — TSH: TSH: 1.86 m[IU]/L (ref 0.40–4.50)

## 2024-09-03 LAB — HEMOGLOBIN A1C
Hgb A1c MFr Bld: 5.4 % (ref <5.7)
Mean Plasma Glucose: 108 mg/dL
eAG (mmol/L): 6 mmol/L

## 2024-09-03 LAB — LIPID PANEL
Cholesterol: 234 mg/dL — ABNORMAL HIGH (ref <200)
HDL: 44 mg/dL — ABNORMAL LOW (ref >=50)
LDL Cholesterol (Calc): 153 mg/dL — ABNORMAL HIGH
Non-HDL Cholesterol (Calc): 190 mg/dL — ABNORMAL HIGH (ref <130)
Total CHOL/HDL Ratio: 5.3 (calc) — ABNORMAL HIGH (ref <5.0)
Triglycerides: 206 mg/dL — ABNORMAL HIGH (ref <150)

## 2024-09-03 LAB — VITAMIN D 25 HYDROXY (VIT D DEFICIENCY, FRACTURES): Vit D, 25-Hydroxy: 38 ng/mL (ref 30–100)

## 2024-09-09 ENCOUNTER — Ambulatory Visit (INDEPENDENT_AMBULATORY_CARE_PROVIDER_SITE_OTHER): Admitting: Family Medicine

## 2024-09-09 ENCOUNTER — Encounter: Payer: Self-pay | Admitting: Family Medicine

## 2024-09-09 VITALS — BP 122/58 | HR 74 | Ht 67.0 in | Wt 147.2 lb

## 2024-09-09 DIAGNOSIS — E782 Mixed hyperlipidemia: Secondary | ICD-10-CM | POA: Diagnosis not present

## 2024-09-09 DIAGNOSIS — Z0000 Encounter for general adult medical examination without abnormal findings: Secondary | ICD-10-CM

## 2024-09-09 DIAGNOSIS — J432 Centrilobular emphysema: Secondary | ICD-10-CM

## 2024-09-09 DIAGNOSIS — E559 Vitamin D deficiency, unspecified: Secondary | ICD-10-CM

## 2024-09-09 DIAGNOSIS — I25118 Atherosclerotic heart disease of native coronary artery with other forms of angina pectoris: Secondary | ICD-10-CM | POA: Diagnosis not present

## 2024-09-09 DIAGNOSIS — M1712 Unilateral primary osteoarthritis, left knee: Secondary | ICD-10-CM | POA: Diagnosis not present

## 2024-09-09 DIAGNOSIS — M25511 Pain in right shoulder: Secondary | ICD-10-CM

## 2024-09-09 DIAGNOSIS — G720 Drug-induced myopathy: Secondary | ICD-10-CM

## 2024-09-09 DIAGNOSIS — G8929 Other chronic pain: Secondary | ICD-10-CM

## 2024-09-09 DIAGNOSIS — I10 Essential (primary) hypertension: Secondary | ICD-10-CM | POA: Diagnosis not present

## 2024-09-09 DIAGNOSIS — R7309 Other abnormal glucose: Secondary | ICD-10-CM | POA: Diagnosis not present

## 2024-09-09 NOTE — Patient Instructions (Addendum)
 Thank you for coming to the office today.  Referral will go in to [facility]  We will try to figure out why the Repatha  is not as effective  Contact for refills  Recent Labs    09/11/23 0844 09/02/24 0825  HGBA1C 5.8* 5.4    Please schedule a Follow-up Appointment to: Return in about 6 months (around 03/09/2025) for 6 month follow-up BP, Lipid updates.  If you have any other questions or concerns, please feel free to call the office or send a message through MyChart. You may also schedule an earlier appointment if necessary.  Additionally, you may be receiving a survey about your experience at our office within a few days to 1 week by e-mail or mail. We value your feedback.  Marsa Officer, DO [facility], [place]

## 2024-09-09 NOTE — Progress Notes (Signed)
 Subjective:    Patient ID: Kristin Parker, female    DOB: Apr 12, 1951, 73 y.o.   MRN: [id number]  Kristin Parker is a 73 y.o. female presenting on 09/09/2024 for Annual Exam   HPI  Discussed the use of AI scribe software for clinical note transcription with the patient, who gave verbal consent to proceed.  History of Present Illness   Kristin Parker is a 73 year old female who presents for an annual physical exam.  Dyslipidemia - LDL cholesterol remains elevated at 153 mg/dL, similar to last year's 154 mg/dL - Total cholesterol in the 230s - Triglycerides improved from 270 to 206 mg/dL - On Repatha  for two years, administered every two weeks but not consistently on the same day - Missed one dose due to administration error - History of statin intolerance due to muscle aches  Osteoarthritis and joint pain - Significant knee pain, particularly in the posterior aspect, limiting ambulation - Received knee injection in September with relief lasting approximately two months - Considering surgical intervention for knee and possibly shoulder due to pain and limited mobility in both joints    Mild low K 3.3, some cramps  Vitamin D  improved Took several months of Vitamin D  K2 supplement, no longer on Atrophic Vaginitis Her urinary and vaginal symptoms have improved with the use of topical estradiol  gel, which she applied nightly for two weeks and then reduced to three times a week. She has not picked up refills yet. - No further dry / itching irritation and urinary symptoms.   Episodic Migraine / Post Concussion Her headaches, previously a concern and thought to be related to a past concussion, have resolved. She has been using a dissolvable headache medication but no longer feels the need for it, although she keeps some on hand in case of future headaches. - Takes Rizatriptan  AS NEEDED but no longer needs  Elevated A1c A1c 5.4, down from prior 5.8 range Meds: Never on med Currently  on ARB Lifestyle: - Diet (improved diet. She does admit inc thirst and dry mouth from meds, drinking sports drink propel and vitamin water) - Exercise (limited) Denies hypoglycemia   Coronary Artery Disease Followed by Cardiology History of Heart Disease on prior Coronary Scan 08/2023 per Cardiology showing Mild non obstructive CAD. On Aspirin , Repatha , Wegovy  Continued weight loss on Wegovy  1mg  weekly The weight loss has made breathing easier and her abdominal area less tight. On Repatha  every 2 weeks, missed 1 dose. Some inconsistency   COPD / Tobacco Abuse Former smoker now Doing well smoke free   Centrilobular Emphysema Followed by [facility] Tried various inhalers, failed Breztri .  On Trelegy Prior Pulmonary Rehab program for breathing. Note breathing improved w weight loss   Anxiety Since last visit, started on Buspar  5mg  THREE TIMES A DAY AS NEEDED. She is taking it regularly twice a day morning and evening and doing well. Continues on Sertraline  100mg  daily with good results. Continue on Hydroxyzine  25mg  capsule nightly for itch and anxiety and sleep   Insomnia Difficulty with falling asleep Failed Trazodone, Melatonin, Sertraline , Buspar  On Ambien  CR 6.25mg  nightly most nights.  CHRONIC HTN: Current Meds - Amlodipine  10mg  daily, Losartan  100mg  daily   Reports good compliance, took meds today. Tolerating well, w/o complaints. Denies CP, dyspnea, HA, edema, dizziness / lightheadedness  Health Maintenance:   Declines Mammo and DEXA   Colonoscopy completed 05/15/21 by Dr Ruel Kung, had 11-12 polyps removed, Last colonoscopy 06/2022, repeat 3 years. 2026  09/09/2024    9:59 AM 04/01/2024   10:48 AM 12/26/2023    4:18 PM  Depression screen PHQ 2/9  Decreased Interest 0 0 1  Down, Depressed, Hopeless 0 0 1  PHQ - 2 Score 0 0 2  Altered sleeping   3  Tired, decreased energy   1  Change in appetite   0  Feeling bad or failure about yourself    2   Trouble concentrating   0  Moving slowly or fidgety/restless   1  Suicidal thoughts   0  PHQ-9 Score   9   Difficult doing work/chores   Somewhat difficult     Data saved with a previous flowsheet row definition       09/09/2024    9:59 AM 04/01/2024   10:48 AM 09/11/2021    2:19 PM 03/22/2021    9:34 AM  GAD 7 : Generalized Anxiety Score  Nervous, Anxious, on Edge 1 0 1 3  Control/stop worrying 1 0 1 3  Worry too much - different things 1 0 1 3  Trouble relaxing 0 0 1 3  Restless 0 0 1 0  Easily annoyed or irritable 2 0 1 3  Afraid - awful might happen 1 0 1 3  Total GAD 7 Score 6 0 7 18  Anxiety Difficulty Not difficult at all  Not difficult at all Not difficult at all     Past Medical History:  Diagnosis Date   Anxiety    Aortic valve sclerosis    Bilateral carotid bruits    Carotid artery stenosis    COPD (chronic obstructive pulmonary disease) (HCC)    Hypertension    Mitral regurgitation    Mixed hyperlipidemia    Shoulder pain    Sleeping difficulties    Past Surgical History:  Procedure Laterality Date   BREAST BIOPSY Left 08/13/2018   affirm bx, x clip, path pending   CARDIAC CATHETERIZATION     CATARACT EXTRACTION Left    2017   CHOLECYSTECTOMY     COLONOSCOPY  2012   Dr Ora   COLONOSCOPY WITH PROPOFOL  N/A 05/15/2021   Procedure: COLONOSCOPY WITH PROPOFOL ;  Surgeon: Therisa Bi, MD;  Location: [facility] END[facility]COPY;  Service: Gastroenterology;  Laterality: N/A;   COLONOSCOPY WITH PROPOFOL  N/A 07/03/2022   Procedure: COLONOSCOPY WITH PROPOFOL ;  Surgeon: Therisa Bi, MD;  Location: [facility] ENDOSCOPY;  Service: Gastroenterology;  Laterality: N/A;   ESOPHAGOGASTRODUODENOSCOPY N/A 04/17/2024   Procedure: EGD (ESOPHAGOGASTRODUODENOSCOPY);  Surgeon: Jinny Carmine, MD;  Location: [facility] ENDOSCOPY;  Service: Endoscopy;  Laterality: N/A;   EYE SURGERY     LEFT HEART CATH AND CORONARY ANGIOGRAPHY N/A 06/25/2017   Procedure: LEFT HEART CATH AND CORONARY ANGIOGRAPHY;  Surgeon:  Mady Bruckner, MD;  Location: [facility] INVASIVE CV LAB;  Service: Cardiovascular;  Laterality: N/A;   Social History   Socioeconomic History   Marital status: Divorced    Spouse name: Not on file   Number of children: Not on file   Years of education: Not on file   Highest education level: GED or equivalent  Occupational History   Occupation: retired   Tobacco Use   Smoking status: Former    Current packs/day: 0.00    Average packs/day: 0.5 packs/day for 30.0 years (15.0 ttl pk-yrs)    Types: Cigarettes    Start date: 10/30/1990    Quit date: 10/30/2020    Years since quitting: 3.8   Smokeless tobacco: Never   Tobacco comments:    Quit  Jan 2022  Vaping Use   Vaping status: Former  Substance and Sexual Activity   Alcohol use: No    Alcohol/week: 0.0 standard drinks of alcohol   Drug use: No   Sexual activity: Not on file  Other Topics Concern   Not on file  Social History Narrative   Not on file   Social Drivers of Health   Financial Resource Strain: Low Risk  (02/07/2023)   Overall Financial Resource Strain (CARDIA)    Difficulty of Paying Living Expenses: Not hard at all  Food Insecurity: No Food Insecurity (04/17/2024)   Hunger Vital Sign    Worried About Running Out of Food in the Last Year: Never true    Ran Out of Food in the Last Year: Never true  Transportation Needs: No Transportation Needs (04/17/2024)   PRAPARE - Administrator, Civil Service (Medical): No    Lack of Transportation (Non-Medical): No  Physical Activity: Insufficiently Active (02/07/2023)   Exercise Vital Sign    Days of Exercise per Week: 3 days    Minutes of Exercise per Session: 30 min  Stress: No Stress Concern Present (02/07/2023)   [employer] of Occupational Health - Occupational Stress Questionnaire    Feeling of Stress : Not at all  Social Connections: Socially Isolated (04/17/2024)   Social Connection and Isolation Panel    Frequency of Communication with Friends and  Family: More than three times a week    Frequency of Social Gatherings with Friends and Family: More than three times a week    Attends Religious Services: Never    Database Administrator or Organizations: No    Attends Banker Meetings: Never    Marital Status: Divorced  Catering Manager Violence: Unknown (04/17/2024)   Humiliation, Afraid, Rape, and Kick questionnaire    Fear of Current or Ex-Partner: No    Emotionally Abused: No    Physically Abused: No    Sexually Abused: Not on file   Family History  Problem Relation Age of Onset   Cancer Mother        bladder cancer   Heart disease Mother 88       Pacemaker   Cancer Father        lung   Multiple sclerosis Sister    Valvular heart disease Brother 43       s/p bioprosthetic valve replacement at [facility]   Breast cancer Neg Hx    Current Outpatient Medications on File Prior to Visit  Medication Sig   acetaminophen  (TYLENOL ) 500 MG tablet Take 2 tablets (1,000 mg total) by mouth every 6 (six) hours as needed for mild pain.   albuterol  (VENTOLIN  HFA) 108 (90 Base) MCG/ACT inhaler INHALE 2 PUFFS EVERY 4 HOURS FOR WHEEZING OR SHORTNESS OF BREATH AS NEEDED   amLODipine  (NORVASC ) 10 MG tablet TAKE 1 TABLET BY MOUTH ONCE DAILY   aspirin  EC 81 MG tablet Take 1 tablet (81 mg total) by mouth daily.   busPIRone  (BUSPAR ) 5 MG tablet TAKE 1 TABLET BY MOUTH TWICE DAILY   estradiol  (ESTRACE ) 0.1 MG/GM vaginal cream Apply 1 gram per vagina every night for 2 weeks, then apply three times a week   Evolocumab  (REPATHA  SURECLICK) 140 MG/ML SOAJ INJECT 1 DOSE INTO THE SKIN EVERY 14 DAYS   fluticasone  (FLONASE ) 50 MCG/ACT nasal spray USE 2 SPRAYS INTO EACH NOSTRIL ONCE DAILY   Fluticasone -Umeclidin-Vilant (TRELEGY ELLIPTA ) 200-62.5-25 MCG/ACT AEPB USE ONE INHALATION INTO THE LUNGS ONCE A  DAY RINSE MOUTH AFTER EACH USE   gabapentin  (NEURONTIN ) 100 MG capsule Start 1 capsule daily, increase by 1 cap every 2-3 days as tolerated up to 3 times a  day, or may take 3 at once in evening.   hydrOXYzine  (VISTARIL ) 25 MG capsule TAKE 1 CAPSULE BY MOUTH EVERY 8 HOURS ASNEEDED   isosorbide  mononitrate (IMDUR ) 60 MG 24 hr tablet TAKE 1 TABLET BY MOUTH ONCE DAILY   losartan  (COZAAR ) 100 MG tablet TAKE 1 TABLET BY MOUTH ONCE DAILY   rizatriptan  (MAXALT -MLT) 10 MG disintegrating tablet DISSOLVE 1 TABLET ON THE TONGUE AS NEEDED FOR MIGRAINE. MAY REPEAT IN 2 HOURS IF NEEDED   sertraline  (ZOLOFT ) 100 MG tablet TAKE 1 TABLET BY MOUTH ONCE DAILY   WEGOVY  1 MG/0.5ML SOAJ SQ injection INJECT 1MG  SUBCUTANEOUSLY ONCE A WEEK   zolpidem  (AMBIEN  CR) 6.25 MG CR tablet Take 1 tablet (6.25 mg total) by mouth at bedtime as needed. for sleep   No current facility-administered medications on file prior to visit.    Review of Systems Per HPI unless specifically indicated above     Objective:    BP (!) 122/58 (BP Location: Left Arm, Cuff Size: Normal)   Pulse 74   Ht 5' 7 (1.702 m)   Wt 147 lb 4 oz (66.8 kg)   SpO2 94%   BMI 23.06 kg/m   Wt Readings from Last 3 Encounters:  09/09/24 147 lb 4 oz (66.8 kg)  07/06/24 151 lb 2 oz (68.5 kg)  04/28/24 158 lb 4 oz (71.8 kg)    Physical Exam Vitals and nursing note reviewed.  Constitutional:      General: She is not in acute distress.    Appearance: She is well-developed. She is not diaphoretic.     Comments: Well-appearing, comfortable, cooperative  HENT:     Head: Normocephalic and atraumatic.  Eyes:     General:        Right eye: No discharge.        Left eye: No discharge.     Conjunctiva/sclera: Conjunctivae normal.     Pupils: Pupils are equal, round, and reactive to light.  Neck:     Thyroid : No thyromegaly.  Cardiovascular:     Rate and Rhythm: Normal rate and regular rhythm.     Pulses: Normal pulses.     Heart sounds: Normal heart sounds. No murmur heard. Pulmonary:     Effort: Pulmonary effort is normal. No respiratory distress.     Breath sounds: Normal breath sounds. No wheezing or  rales.  Abdominal:     General: Bowel sounds are normal. There is no distension.     Palpations: Abdomen is soft. There is no mass.     Tenderness: There is no abdominal tenderness.  Musculoskeletal:        General: No tenderness.     Cervical back: Normal range of motion and neck supple.     Comments: Left Knee with crepitus and reduced ROM mild effusion.  R Shoulder reduced range above shoulder level with impingement testing positive.  Lymphadenopathy:     Cervical: No cervical adenopathy.  Skin:    General: Skin is warm and dry.     Findings: No erythema or rash.  Neurological:     Mental Status: She is alert and oriented to person, place, and time.     Comments: Distal sensation intact to light touch all extremities  Psychiatric:        Mood and Affect: Mood normal.  Behavior: Behavior normal.        Thought Content: Thought content normal.     Comments: Well groomed, good eye contact, normal speech and thoughts     Results for orders placed or performed in visit on 09/02/24  VITAMIN D  25 Hydroxy (Vit-D Deficiency, Fractures)   Collection Time: 09/02/24  8:25 AM  Result Value Ref Range   Vit D, 25-Hydroxy 38 30 - 100 ng/mL  Comprehensive metabolic panel with GFR   Collection Time: 09/02/24  8:25 AM  Result Value Ref Range   Glucose, Bld 97 65 - 99 mg/dL   BUN 14 7 - 25 mg/dL   Creat 9.18 9.39 - 8.99 mg/dL   eGFR 76 > OR = 60 fO/fpw/8.26f7   BUN/Creatinine Ratio SEE NOTE: 6 - 22 (calc)   Sodium 141 135 - 146 mmol/L   Potassium 3.3 (L) 3.5 - 5.3 mmol/L   Chloride 104 98 - 110 mmol/L   CO2 27 20 - 32 mmol/L   Calcium  9.0 8.6 - 10.4 mg/dL   Total Protein 6.7 6.1 - 8.1 g/dL   Albumin 4.1 3.6 - 5.1 g/dL   Globulin 2.6 1.9 - 3.7 g/dL (calc)   AG Ratio 1.6 1.0 - 2.5 (calc)   Total Bilirubin 0.3 0.2 - 1.2 mg/dL   Alkaline phosphatase (APISO) 108 37 - 153 U/L   AST 13 10 - 35 U/L   ALT 10 6 - 29 U/L  CBC with Differential/Platelet   Collection Time: 09/02/24   8:25 AM  Result Value Ref Range   WBC 8.6 3.8 - 10.8 Thousand/uL   RBC 4.57 3.80 - 5.10 Million/uL   Hemoglobin 14.1 11.7 - 15.5 g/dL   HCT 58.2 64.9 - 54.9 %   MCV 91.2 80.0 - 100.0 fL   MCH 30.9 27.0 - 33.0 pg   MCHC 33.8 32.0 - 36.0 g/dL   RDW 86.8 88.9 - 84.9 %   Platelets 299 140 - 400 Thousand/uL   MPV 9.5 7.5 - 12.5 fL   Neutro Abs 5,151 1,500 - 7,800 cells/uL   Absolute Lymphocytes 2,726 850 - 3,900 cells/uL   Absolute Monocytes 542 200 - 950 cells/uL   Eosinophils Absolute 120 15 - 500 cells/uL   Basophils Absolute 60 0 - 200 cells/uL   Neutrophils Relative % 59.9 %   Total Lymphocyte 31.7 %   Monocytes Relative 6.3 %   Eosinophils Relative 1.4 %   Basophils Relative 0.7 %  Hemoglobin A1c   Collection Time: 09/02/24  8:25 AM  Result Value Ref Range   Hgb A1c MFr Bld 5.4 <5.7 %   Mean Plasma Glucose 108 mg/dL   eAG (mmol/L) 6.0 mmol/L  Lipid panel   Collection Time: 09/02/24  8:25 AM  Result Value Ref Range   Cholesterol 234 (H) <200 mg/dL   HDL 44 (L) > OR = 50 mg/dL   Triglycerides 793 (H) <150 mg/dL   LDL Cholesterol (Calc) 153 (H) mg/dL (calc)   Total CHOL/HDL Ratio 5.3 (H) <5.0 (calc)   Non-HDL Cholesterol (Calc) 190 (H) <130 mg/dL (calc)  TSH   Collection Time: 09/02/24  8:25 AM  Result Value Ref Range   TSH 1.86 0.40 - 4.50 mIU/L      Assessment & Plan:   Problem List Items Addressed This Visit     Centrilobular emphysema (HCC)   Chronic right shoulder pain   Coronary artery disease of native artery of native heart with stable angina pectoris   Drug-induced myopathy  Elevated hemoglobin A1c   Essential hypertension   Other Visit Diagnoses       Annual physical exam    -  Primary     Primary osteoarthritis of left knee         Vitamin D  deficiency         Mixed hyperlipidemia            Updated Health Maintenance information Reviewed recent lab results with patient Encouraged improvement to lifestyle with diet and exercise Goal of  weight loss   Adult Wellness Visit Annual wellness visit completed. Blood work: TSH normal, A1c improved to 5.4, potassium low at 3.3, vitamin D  at 38. Blood pressure improved to 122/58 post-medication. Discussed colon cancer screening and mammogram pause. - Continue current medications and lifestyle modifications. - Schedule colon cancer screening for next year. - Monitor blood pressure and potassium levels. - Encouraged dietary potassium intake and hydration.  Mixed hyperlipidemia with atherosclerotic heart disease of native coronary artery Drug induced Myopathy - failed simvastatin , rosuvastatin , atorvastatin previously  Cholesterol levels elevated: total cholesterol 230s, LDL 153, triglycerides 206. Inconsistent Repatha  injections may affect efficacy. Statins discontinued due to muscle aches. Cardiologist consultation pending. - Consult cardiologist for cholesterol management. - Discuss Repatha  efficacy and alternatives with pharmacist and cardiologist. Question adherence or if medicine is working properly if she has LDL >150 still on Repatha  - Continue Repatha  injections as prescribed.  Essential hypertension Blood pressure improved to 122/58 after medication. - Continue current antihypertensive regimen.  Unilateral primary osteoarthritis, left knee Chronic L Knee Pain Chronic R Shoulder pain with rotator cuff tendinopathy and arthritis Left knee osteoarthritis with significant pain and limitation. Previous injection provided temporary relief. Referral to orthopedics planned. - Referred to [facility] [place], for evaluation and management.  Centrilobular emphysema, stable Centrilobular emphysema stable with no recent exacerbations. - Continue current management and monitoring.       No orders of the defined types were placed in this encounter.   No orders of the defined types were placed in this encounter.    Follow up plan: Return in about 6 months  (around 03/09/2025) for 6 month follow-up BP, Lipid updates.  Route chart to [facility] for review on Repatha  questions   Marsa Officer, DO [facility] 09/09/2024, 10:07 AM

## 2024-09-10 ENCOUNTER — Other Ambulatory Visit: Payer: Self-pay | Admitting: Family Medicine

## 2024-09-10 DIAGNOSIS — M1712 Unilateral primary osteoarthritis, left knee: Secondary | ICD-10-CM

## 2024-09-10 DIAGNOSIS — G8929 Other chronic pain: Secondary | ICD-10-CM

## 2024-09-10 DIAGNOSIS — M19011 Primary osteoarthritis, right shoulder: Secondary | ICD-10-CM

## 2024-09-14 ENCOUNTER — Telehealth: Payer: Self-pay | Admitting: Pharmacist

## 2024-09-14 DIAGNOSIS — M1712 Unilateral primary osteoarthritis, left knee: Secondary | ICD-10-CM | POA: Diagnosis not present

## 2024-09-14 DIAGNOSIS — M7122 Synovial cyst of popliteal space [Baker], left knee: Secondary | ICD-10-CM | POA: Diagnosis not present

## 2024-09-14 DIAGNOSIS — M19011 Primary osteoarthritis, right shoulder: Secondary | ICD-10-CM | POA: Diagnosis not present

## 2024-09-14 NOTE — Progress Notes (Unsigned)
   Outreach Note  09/14/2024 Name: Kristin Parker MRN: [id number] DOB: 03-13-1951  Referred by: [place] Marsa PARAS, DO  Receive a message from PCP requesting outreach to patient to discuss medication management/medication adherence. Provider advises that patient has patient has been on Repatha  for about 3 years, previously with improvement LDL to 90 range. Previously 160-200 LDL. It seems now LDL calc is 153. She has missed 1 dose in past few weeks only. She is indicated for repatha  with CAD and drug myopathy failing statins and sub optimal LDL. She confirms adherence to med.  Was unable to reach patient via telephone today and have left HIPAA compliant voicemail asking patient to return my call.    Follow Up Plan: Will collaborate with Care Guide to outreach to schedule follow up with me  Sharyle Sia, PharmD, [facility] Clinical Pharmacist [facility] [phone]

## 2024-09-15 ENCOUNTER — Telehealth: Payer: Self-pay

## 2024-09-15 NOTE — Telephone Encounter (Signed)
 Copied from CRM #[id number]. Topic: General - Other >> Sep 15, 2024  8:32 AM Jeoffrey H wrote: Reason for CRM: Patient was returning Elizabeth's call from yesterday. I did reach out to her at [phone] however, her phone went straight to vm. Patient asking for call back.   [phone]

## 2024-09-18 ENCOUNTER — Telehealth: Payer: Self-pay | Admitting: Internal Medicine

## 2024-09-18 ENCOUNTER — Other Ambulatory Visit: Payer: Self-pay | Admitting: Orthopaedic Surgery

## 2024-09-18 DIAGNOSIS — M19011 Primary osteoarthritis, right shoulder: Secondary | ICD-10-CM

## 2024-09-18 DIAGNOSIS — I10 Essential (primary) hypertension: Secondary | ICD-10-CM | POA: Diagnosis not present

## 2024-09-18 NOTE — Telephone Encounter (Signed)
   Pre-operative Risk Assessment    Patient Name: Kristin Parker  DOB: [birth year] MRN: [id number]   Date of last office visit: unknown Date of next office visit: unknwon   Request for Surgical Clearance    Procedure:  right reverse total shoulder arthroplasty  Date of Surgery:  Clearance TBD                                Surgeon:  Dr. Bonner Hair Surgeon's Group or Practice Name:  [facility] Phone number:  [phone] Fax number:  [phone]   Type of Clearance Requested:   - Medical    Type of Anesthesia:  General interscalene block   Additional requests/questions:    SignedBerwyn LELON Sprung   09/18/2024, 10:42 AM

## 2024-09-18 NOTE — Telephone Encounter (Signed)
   Name: Kristin Parker  DOB: 10-17-1951  MRN: [id number]  Primary Cardiologist: Lonni Hanson, MD   Preoperative team, please contact this patient and set up a phone call appointment for further preoperative risk assessment. Please obtain consent and complete medication review. Thank you for your help.  I confirm that guidance regarding antiplatelet and oral anticoagulation therapy has been completed and, if necessary, noted below.  On Aspirin  but not requested to hold. If necessary, can hold for 7 days prior to procedure and start again ASAP thereafter.   I also confirmed the patient resides in the state of [place] . As per [facility] Medical Board telemedicine laws, the patient must reside in the state in which the provider is licensed.   Lamarr Satterfield, NP 09/18/2024, 10:49 AM [facility]

## 2024-09-18 NOTE — Telephone Encounter (Signed)
 Patient returned Pre-op call.

## 2024-09-18 NOTE — Telephone Encounter (Signed)
 Left message on machine for pt to contact the office to schedule pre op tele appt.

## 2024-09-21 ENCOUNTER — Ambulatory Visit

## 2024-09-21 ENCOUNTER — Telehealth (HOSPITAL_BASED_OUTPATIENT_CLINIC_OR_DEPARTMENT_OTHER): Payer: Self-pay

## 2024-09-21 NOTE — Telephone Encounter (Signed)
 Copied from CRM [id number]. Topic: General - Other >> Sep 21, 2024  9:39 AM Macario HERO wrote: Reason for CRM: Patient called said that [facility] advised she will need the provider to send over a surgical clearance.

## 2024-09-21 NOTE — Telephone Encounter (Signed)
 Tried calling patient no answer or VM. Please advise if she calls back

## 2024-09-21 NOTE — Telephone Encounter (Signed)
 I believe she called the wrong office back. It looks like the Cardiology office is attempting to call her to do a Cardiology Pre-Op.  I just saw her in November and I have all of the information I need to fill out her pre-op paperwork. I just received fax today with pre-op, so I will work on it now. She does not need new apt with me as PCP.  She should call Cardiology back to finish the pre-op discussion with them.  Marsa Officer, DO [facility] 09/21/2024, 10:01 AM

## 2024-09-21 NOTE — Telephone Encounter (Signed)
 Pt returning call to reschedule appt. Please advise.

## 2024-09-21 NOTE — Telephone Encounter (Signed)
 Spoke to patient and informed her we needed to reschedule her for pre-op clearance, she is not happy we needed to reschedule. But, she is now rescheduled to 10/02/24 with Rosaline Bane, NP.

## 2024-09-21 NOTE — Telephone Encounter (Signed)
 I have completed pre-op form that I received, she is medically cleared from diabetes stand point, will be faxed on 11/25.  Marsa Officer, DO [facility] 09/21/2024, 6:03 PM

## 2024-09-21 NOTE — Telephone Encounter (Signed)
 Patient scheduled for pre-op clearance on 09/21/24 (today) with Katlyn West, NP.      Patient Consent for Virtual Visit        Kristin Parker has provided verbal consent on 09/21/2024 for a virtual visit (video or telephone).   CONSENT FOR VIRTUAL VISIT FOR:  Kristin Parker  By participating in this virtual visit I agree to the following:  I hereby voluntarily request, consent and authorize [facility] and its employed or contracted physicians, physician assistants, nurse practitioners or other licensed health care professionals (the Practitioner), to provide me with telemedicine health care services (the "Services) as deemed necessary by the treating Practitioner. I acknowledge and consent to receive the Services by the Practitioner via telemedicine. I understand that the telemedicine visit will involve communicating with the Practitioner through live audiovisual communication technology and the disclosure of certain medical information by electronic transmission. I acknowledge that I have been given the opportunity to request an in-person assessment or other available alternative prior to the telemedicine visit and am voluntarily participating in the telemedicine visit.  I understand that I have the right to withhold or withdraw my consent to the use of telemedicine in the course of my care at any time, without affecting my right to future care or treatment, and that the Practitioner or I may terminate the telemedicine visit at any time. I understand that I have the right to inspect all information obtained and/or recorded in the course of the telemedicine visit and may receive copies of available information for a reasonable fee.  I understand that some of the potential risks of receiving the Services via telemedicine include:  Delay or interruption in medical evaluation due to technological equipment failure or disruption; Information transmitted may not be sufficient (e.g. poor  resolution of images) to allow for appropriate medical decision making by the Practitioner; and/or  In rare instances, security protocols could fail, causing a breach of personal health information.  Furthermore, I acknowledge that it is my responsibility to provide information about my medical history, conditions and care that is complete and accurate to the best of my ability. I acknowledge that Practitioner's advice, recommendations, and/or decision may be based on factors not within their control, such as incomplete or inaccurate data provided by me or distortions of diagnostic images or specimens that may result from electronic transmissions. I understand that the practice of medicine is not an exact science and that Practitioner makes no warranties or guarantees regarding treatment outcomes. I acknowledge that a copy of this consent can be made available to me via my patient portal [facility] MyChart), or I can request a printed copy by calling the office of [facility].    I understand that my insurance will be billed for this visit.   I have read or had this consent read to me. I understand the contents of this consent, which adequately explains the benefits and risks of the Services being provided via telemedicine.  I have been provided ample opportunity to ask questions regarding this consent and the Services and have had my questions answered to my satisfaction. I give my informed consent for the services to be provided through the use of telemedicine in my medical care

## 2024-09-23 ENCOUNTER — Other Ambulatory Visit: Payer: Self-pay | Admitting: Family Medicine

## 2024-09-23 DIAGNOSIS — F0781 Postconcussional syndrome: Secondary | ICD-10-CM

## 2024-09-23 DIAGNOSIS — G43909 Migraine, unspecified, not intractable, without status migrainosus: Secondary | ICD-10-CM

## 2024-09-28 ENCOUNTER — Encounter: Payer: Self-pay | Admitting: Pharmacist

## 2024-09-28 ENCOUNTER — Other Ambulatory Visit: Admitting: Pharmacist

## 2024-09-28 DIAGNOSIS — I25118 Atherosclerotic heart disease of native coronary artery with other forms of angina pectoris: Secondary | ICD-10-CM

## 2024-09-28 NOTE — Patient Instructions (Signed)
 Thank you for taking the time to review the Repatha  injection technique with me today.    The following are the web addresses for the how to use video and reference guide that we discussed.   Video:   https://www.repatha .com/how-to-start-repatha -injection   Reference Guide:   https://www.pi.amgen.com/-/media/Project/Amgen/Repository/pi-amgen-com/Repatha /repatha_rg_english.pdf   Please review these and let me know if you have any questions!   I look forward to speaking with you again by telephone on 10/12/2024 at 9:00 AM    Sharyle Sia, PharmD, [facility] Clinical Pharmacist [facility] [phone]

## 2024-09-28 NOTE — Progress Notes (Signed)
   09/28/2024  Patient ID: Kristin Parker, female   DOB: 1951/08/26, 73 y.o.   MRN: [id number]  Referred by: Edman Marsa PARAS, DO   Receive a message from PCP requesting outreach to patient to discuss medication management/medication adherence. Provider advises that patient has been on Repatha  for about 3 years, previously with improvement LDL to 90 range. Previously 160-200 LDL. It seems now LDL calc is 153. She has missed 1 dose in past few weeks only. She is indicated for repatha  with CAD and drug myopathy failing statins and sub optimal LDL. She confirms adherence to med.   Outreach to patient today.   Patient admits to missed doses of Repatha . Reports that she has sometimes gotten confused with which day to administer (trying to count out the days) and also had an injection error with latest dose. Last dose successfully given: 09/01/2024  Recommend patient to restart Repatha  today (Monday, 09/28/2024) and to consistently use calendar as adherence tool to help with reminders to take Repatha  injection every OTHER Monday.   Review Repatha  administration technique. Reports has had injections in the past where she noticed loss of medication/leakage. Discuss recommended injection sites. Counsel on importance of pushing firmly against skin, press grey button and continue pushing for ~15 seconds and until hears second click. Confirms using injection immediately after (within 5 minutes) of removing orange cap. Find that patient is not letting autoinjector come to room temperature before giving injection.  Counsel to wait 30 minutes after taking autoinjector from refrigerator before giving injection   Also send patient links to Repatha  how to use video and reference guide via MyChart for her to ask daughter to open and review with her. - Offer to also outreach to daughter today to discuss injection technique/connect on sending video, but patient declines. States will speak with daughter about  reviewing this video  Follow Up Plan: Clinical Pharmacist will follow up with patient by telephone on 10/12/2024 at 9:00 AM   Sharyle Sia, PharmD, [facility] Clinical Pharmacist [facility] [phone]

## 2024-09-29 NOTE — Telephone Encounter (Signed)
 Requested Prescriptions  Pending Prescriptions Disp Refills   rizatriptan  (MAXALT -MLT) 10 MG disintegrating tablet [Pharmacy Med Name: RIZATRIPTAN  BENZOATE 10 MG ODT] 10 tablet 0    Sig: DISSOLVE 1 TABLET ON THE TONGUE AS NEEDED FOR MIGRAINE. MAY REPEAT IN 2 HOURS IF NEEDED     Neurology:  Migraine Therapy - Triptan Passed - 09/29/2024  9:24 AM      Passed - Last BP in normal range    BP Readings from Last 1 Encounters:  09/09/24 (!) 122/58         Passed - Valid encounter within last 12 months    Recent Outpatient Visits           2 weeks ago Annual physical exam   [place] [facility] [place], Marsa PARAS, DO   2 months ago Primary osteoarthritis of left knee   [place] [facility] [place], Marsa PARAS, DO   5 months ago Gastroesophageal reflux disease without esophagitis   [place] [facility] [place], Marsa PARAS, DO   6 months ago Coronary artery disease of native artery of native heart with stable angina pectoris   [facility] [place] Marsa PARAS, DO   9 months ago Atrophic vaginitis   [place] [facility] [place], Marsa PARAS, [place]

## 2024-10-01 ENCOUNTER — Encounter: Payer: Self-pay | Admitting: Orthopaedic Surgery

## 2024-10-02 ENCOUNTER — Ambulatory Visit: Attending: Cardiovascular Disease | Admitting: Nurse Practitioner

## 2024-10-02 DIAGNOSIS — Z01810 Encounter for preprocedural cardiovascular examination: Secondary | ICD-10-CM | POA: Diagnosis not present

## 2024-10-02 NOTE — Progress Notes (Signed)
 Virtual Visit via Telephone Note   Because of [name] co-morbid illnesses, she is at least at moderate risk for complications without adequate follow up.  This format is felt to be most appropriate for this patient at this time.  Due to technical limitations with video connection (technology), today's appointment will be conducted as an audio only telehealth visit, and [name] verbally agreed to proceed in this manner.   All issues noted in this document were discussed and addressed.  No physical exam could be performed with this format.  Evaluation Performed:  Preoperative cardiovascular risk assessment _____________   Date:  10/02/2024   Patient ID:  Kristin Parker, DOB [birth year], MRN [id number] Patient Location:  Home Provider location:   Office  Primary Care Provider:  [place] Marsa PARAS, DO Primary Cardiologist:  Lonni Hanson, MD  Chief Complaint / Patient Profile   73 y.o. y/o female with a h/o palpitations, mild carotid stenosis, CAD with LHC 05/2017 that showed mild to moderate CAD recommended for medical management, CCTA with CAC score of 215, mild nonobstructive CAD, mild aortic stenosis on echo 10/2023, emphysema who is pending right reverse total shoulder arthroplasty with Dr. Cristy on date TBD and presents today for telephonic preoperative cardiovascular risk assessment.  History of Present Illness    Kristin Parker is a 73 y.o. female who presents via audio/video conferencing for a telehealth visit today.  Pt was last seen in cardiology clinic on 11/26/23 by Barnie Hila, NP.  At that time YASAMIN KAREL was doing well.  The patient is now pending procedure as outlined above. Since her last visit, she denies chest pain, shortness of breath, lower extremity edema, fatigue, palpitations, melena, hematuria, presyncope, syncope, orthopnea, and PND.  Reports shortness of breath has improved with weight loss. She is somewhat limited by shoulder pain but is  active at home and in her yard and is able to achieve > 4 METS activity without concerning cardiac symptoms.  Past Medical History    Past Medical History:  Diagnosis Date   Anxiety    Aortic valve sclerosis    Bilateral carotid bruits    Carotid artery stenosis    COPD (chronic obstructive pulmonary disease) (HCC)    Hypertension    Mitral regurgitation    Mixed hyperlipidemia    Shoulder pain    Sleeping difficulties    Past Surgical History:  Procedure Laterality Date   BREAST BIOPSY Left 08/13/2018   affirm bx, x clip, path pending   CARDIAC CATHETERIZATION     CATARACT EXTRACTION Left    2017   CHOLECYSTECTOMY     COLONOSCOPY  2012   Dr Ora   COLONOSCOPY WITH PROPOFOL  N/A 05/15/2021   Procedure: COLONOSCOPY WITH PROPOFOL ;  Surgeon: Therisa Bi, MD;  Location: [facility];  Service: Gastroenterology;  Laterality: N/A;   COLONOSCOPY WITH PROPOFOL  N/A 07/03/2022   Procedure: COLONOSCOPY WITH PROPOFOL ;  Surgeon: Therisa Bi, MD;  Location: [facility] ENDOSCOPY;  Service: Gastroenterology;  Laterality: N/A;   ESOPHAGOGASTRODUODENOSCOPY N/A 04/17/2024   Procedure: EGD (ESOPHAGOGASTRODUODENOSCOPY);  Surgeon: Jinny Carmine, MD;  Location: [facility] ENDOSCOPY;  Service: Endoscopy;  Laterality: N/A;   EYE SURGERY     LEFT HEART CATH AND CORONARY ANGIOGRAPHY N/A 06/25/2017   Procedure: LEFT HEART CATH AND CORONARY ANGIOGRAPHY;  Surgeon: Hanson Lonni, MD;  Location: [facility] INVASIVE CV LAB;  Service: Cardiovascular;  Laterality: N/A;    Allergies  Allergies  Allergen Reactions   Bactrim  [Sulfamethoxazole -Trimethoprim ] Nausea And Vomiting  Aleve [Naproxen Sodium] Swelling    Lips. But patient can take Ibuprofen  without problems   Atorvastatin     myalgias   Crestor  [Rosuvastatin ]     5mg , 20mg  - myalgias   Simvastatin      myalgias   Zetia  [Ezetimibe ]     myalgias    Home Medications    Prior to Admission medications   Medication Sig Start Date End Date Taking? Authorizing  Provider  acetaminophen  (TYLENOL ) 500 MG tablet Take 2 tablets (1,000 mg total) by mouth every 6 (six) hours as needed for mild pain. 02/19/21   Desiderio Schanz, MD  albuterol  (VENTOLIN  HFA) 108 (90 Base) MCG/ACT inhaler INHALE 2 PUFFS EVERY 4 HOURS FOR WHEEZING OR SHORTNESS OF BREATH AS NEEDED 10/23/22   [place], Marsa PARAS, DO  amLODipine  (NORVASC ) 10 MG tablet TAKE 1 TABLET BY MOUTH ONCE DAILY 05/14/24   [place], Marsa PARAS, DO  aspirin  EC 81 MG tablet Take 1 tablet (81 mg total) by mouth daily. 03/12/17   End, Lonni, MD  busPIRone  (BUSPAR ) 5 MG tablet TAKE 1 TABLET BY MOUTH TWICE DAILY 08/31/24   [place], Marsa PARAS, DO  estradiol  (ESTRACE ) 0.1 MG/GM vaginal cream Apply 1 gram per vagina every night for 2 weeks, then apply three times a week 12/17/23   [place] Marsa PARAS, DO  Evolocumab  (REPATHA  SURECLICK) 140 MG/ML SOAJ INJECT 1 DOSE INTO THE SKIN EVERY 14 DAYS 05/27/24   End, Lonni, MD  fluticasone  (FLONASE ) 50 MCG/ACT nasal spray USE 2 SPRAYS INTO EACH NOSTRIL ONCE DAILY 04/29/23   [place], Marsa PARAS, DO  Fluticasone -Umeclidin-Vilant (TRELEGY ELLIPTA ) 200-62.5-25 MCG/ACT AEPB USE ONE INHALATION INTO THE LUNGS ONCE A DAY RINSE MOUTH AFTER EACH USE 05/18/24   Isadora Hose, MD  gabapentin  (NEURONTIN ) 100 MG capsule Start 1 capsule daily, increase by 1 cap every 2-3 days as tolerated up to 3 times a day, or may take 3 at once in evening. 07/06/24   Karamalegos, Marsa PARAS, DO  hydrOXYzine  (VISTARIL ) 25 MG capsule TAKE 1 CAPSULE BY MOUTH EVERY 8 HOURS ASNEEDED 08/31/24   [place], Marsa PARAS, DO  isosorbide  mononitrate (IMDUR ) 60 MG 24 hr tablet TAKE 1 TABLET BY MOUTH ONCE DAILY 12/10/23   End, Lonni, MD  losartan  (COZAAR ) 100 MG tablet TAKE 1 TABLET BY MOUTH ONCE DAILY 07/13/24   Karamalegos, Marsa PARAS, DO  rizatriptan  (MAXALT -MLT) 10 MG disintegrating tablet DISSOLVE 1 TABLET ON THE TONGUE AS NEEDED FOR MIGRAINE. MAY REPEAT IN 2 HOURS IF NEEDED 09/29/24    [place], Marsa PARAS, DO  sertraline  (ZOLOFT ) 100 MG tablet TAKE 1 TABLET BY MOUTH ONCE DAILY 05/14/24   [place], Marsa PARAS, DO  WEGOVY  1 MG/0.5ML SOAJ SQ injection INJECT 1MG  SUBCUTANEOUSLY ONCE A WEEK 08/20/24   Karamalegos, Marsa PARAS, DO  zolpidem  (AMBIEN  CR) 6.25 MG CR tablet Take 1 tablet (6.25 mg total) by mouth at bedtime as needed. for sleep 09/11/23   [place] Marsa PARAS, DO    Physical Exam    Vital Signs:  HAZLE OGBURN does not have vital signs available for review today.  Given telephonic nature of communication, physical exam is limited. AAOx3. NAD. Normal affect.  Speech and respirations are unlabored.  Accessory Clinical Findings    None  Assessment & Plan    1.  Preoperative Cardiovascular Risk Assessment: According to the Revised Cardiac Risk Index (RCRI), her Perioperative Risk of Major Cardiac Event is (%): 0.4. Her Functional Capacity in METs is: 6.61 according to the Duke Activity Status Index (DASI). The  patient is doing well from a cardiac perspective. Therefore, based on ACC/AHA guidelines, the patient would be at acceptable risk for the planned procedure without further cardiovascular testing.   The patient was advised that if she develops new symptoms prior to surgery to contact our office to arrange for a follow-up visit, and she verbalized understanding.  Per office protocol, she may hold aspirin  for 5-7 days prior to procedure and should resume as soon as hemodynamically stable postoperatively.  A copy of this note will be routed to requesting surgeon.  Time:   Today, I have spent 10 minutes with the patient with telehealth technology discussing medical history, symptoms, and management plan.     Rosaline EMERSON Bane, NP-C  10/02/2024, 8:44 AM [address] [place], [place] [address] Office [phone] Fax [phone]

## 2024-10-06 ENCOUNTER — Inpatient Hospital Stay: Admission: RE | Admit: 2024-10-06 | Discharge: 2024-10-06 | Attending: Orthopaedic Surgery

## 2024-10-06 DIAGNOSIS — M19011 Primary osteoarthritis, right shoulder: Secondary | ICD-10-CM

## 2024-10-12 ENCOUNTER — Other Ambulatory Visit

## 2024-10-12 ENCOUNTER — Other Ambulatory Visit: Payer: Self-pay

## 2024-10-12 ENCOUNTER — Other Ambulatory Visit: Payer: Self-pay | Admitting: Family Medicine

## 2024-10-12 DIAGNOSIS — I25118 Atherosclerotic heart disease of native coronary artery with other forms of angina pectoris: Secondary | ICD-10-CM

## 2024-10-12 DIAGNOSIS — K219 Gastro-esophageal reflux disease without esophagitis: Secondary | ICD-10-CM

## 2024-10-12 MED ORDER — OMEPRAZOLE 20 MG PO CPDR: 20.0000 mg | DELAYED_RELEASE_CAPSULE | Freq: Every day | ORAL | 3 refills | Status: AC

## 2024-10-12 NOTE — Progress Notes (Signed)
° °  10/12/2024 Name: ZANIYA MCAULAY MRN: [id number] DOB: 1950/11/09  Chief Complaint  Patient presents with   Medication Management   Medication Adherence    SYBLE PICCO is a 73 y.o. year old female who presented for a telephone visit.   They were referred to the pharmacist by their PCP for assistance in managing medication adherence to Repatha .   Today patient reports improved adherence to Repatha , but now injecting Repatha  140 mg SureClick every other Thursday (last dose on 12/4; planning for next dose on 12/18).  Again review to consistently use calendar as adherence tool to help with reminders to take Repatha  injection every OTHER Thursday. Patient verbalizes understanding via teachback, but unable to write out on calendar for 2026 today as needing new calendar. States will obtain new calendar today and write reminders for every OTHER Thursday. - Declines need for call back for support with this  Review Repatha  administration technique. Reports improvement in technique, including waiting 30 minutes after taking autoinjector from refrigerator before giving injection. Counsel on importance of pushing firmly against skin, press grey button and continue pushing for ~15 seconds and until hears second click. Confirms using injection immediately after (within 5 minutes) of removing orange cap.   Again today declines offer to outreach to daughter or other family/friend for support with adherence reminders/access to review administration video.  Agree to schedule appointment for follow up fasting lipid panel lab. Scheduled for 12/07/2024 at 8:45 AM - Will collaborate with PCP   Follow Up Plan:  Clinical Pharmacist will follow up with patient by telephone on 11/11/2024 at 9:00 AM    Sharyle Sia, PharmD, [facility] Clinical Pharmacist [facility] [phone]

## 2024-10-12 NOTE — Patient Instructions (Signed)
 Thank you for taking the time to review the Repatha  injection technique with me today.    The following are the web addresses for the how to use video and reference guide that we discussed.   Video:   https://www.repatha .com/how-to-start-repatha -injection   Reference Guide:   https://www.pi.amgen.com/-/media/Project/Amgen/Repository/pi-amgen-com/Repatha /repatha_rg_english.pdf   Please review these and let me know if you have any questions!     Sharyle Sia, PharmD, [facility] Clinical Pharmacist [facility] [phone]

## 2024-10-19 ENCOUNTER — Other Ambulatory Visit (HOSPITAL_COMMUNITY): Payer: Self-pay

## 2024-10-19 ENCOUNTER — Telehealth: Payer: Self-pay

## 2024-10-19 NOTE — Telephone Encounter (Signed)
 Pharmacy Patient Advocate Encounter   Received notification from Onbase that prior authorization for Wegovy  1 is required/requested.   Insurance verification completed.   The patient is insured through [employer].   Per test claim: PA required; PA submitted to above mentioned insurance via Latent Key/confirmation #/EOC [id number] Status is pending

## 2024-10-26 ENCOUNTER — Other Ambulatory Visit (HOSPITAL_COMMUNITY): Payer: Self-pay

## 2024-10-26 NOTE — Telephone Encounter (Signed)
 Pharmacy Patient Advocate Encounter  Received notification from [employer] that Prior Authorization for Wegovy  1 has been DENIED.  Full denial letter will be uploaded to the media tab. See denial reason below.    PA #/Case ID/Reference #: # [id number]

## 2024-10-26 NOTE — Telephone Encounter (Signed)
 Routing call to Sharyle Sia [facility] CPP for review and advice.  Sharyle, it looks like her Wegovy  was denied due to formulary coverage only? However the rest of the denial reason is a bit unclear. She was on Wegovy  for cardiovascular disease  Associated Diagnoses: Coronary artery disease of native artery of native heart with stable angina pectoris [I25.118]   Not for Diabetes or Obesity. It seems like the rest of the info listed here does not apply to her case in particular.  I know you are working with her on Repatha , but can you look into this PA denial? I imagine if Wegovy  is not on formulary - there is not an alternative for this indication.  Thanks for your help!  Marsa Officer, DO [facility] 10/26/2024, 10:30 AM

## 2024-10-27 ENCOUNTER — Telehealth: Payer: Self-pay | Admitting: Pharmacist

## 2024-10-27 ENCOUNTER — Other Ambulatory Visit (HOSPITAL_COMMUNITY): Payer: Self-pay

## 2024-10-27 NOTE — Telephone Encounter (Signed)
 E-Appeal has been submitted. Will advise when response is received or follow up in 1 week. Please be advised that most companies may take 30 days to make a decision. Appeal letter and supporting clinical documentation have been uploaded and submitted via CMM.     Thank you, Devere Pandy, PharmD Clinical Pharmacist  [facility]  Direct Dial: [phone]

## 2024-10-28 ENCOUNTER — Telehealth: Payer: Self-pay

## 2024-10-28 ENCOUNTER — Other Ambulatory Visit (HOSPITAL_COMMUNITY): Payer: Self-pay

## 2024-10-28 NOTE — Telephone Encounter (Signed)
 I checked the chart, it shows that we completed the pre-op form in November. It was faxed 11/26, it is scanned under media.  I don't have any other pre-op form.  If that is not the right one, then they probably need to send a new form or give us  new information I am not aware of it.  Marsa Officer, DO [facility] 10/28/2024, 12:36 PM

## 2024-10-28 NOTE — Telephone Encounter (Signed)
 Attempted to call Joen, please notify her if she calls back that this form was faxed on 11/26.  If she needs further assistance please get a phone number where she can be reached

## 2024-10-28 NOTE — Telephone Encounter (Signed)
 Copied from CRM [id number]. Topic: General - Other >> Oct 28, 2024 11:48 AM Amy B wrote: Reason for CRM: Joen with [facility] states they have not received the preoperative clearance form.  Please fax to [phone]

## 2024-11-02 ENCOUNTER — Other Ambulatory Visit: Payer: Self-pay | Admitting: Family Medicine

## 2024-11-02 NOTE — Telephone Encounter (Signed)
 Unfortunately the appeal for Wegovy  has been denied by the insurance. The appeal letter did request a continuation of therapy and included patient's starting weight and the fact the patient is not diabetic.  The full denial letter can be found under the media tab.  Denial reason:  Were denying coverage because: This drug is not on your Drug Guide (formulary). Your prescriber has chosen this drug to treat your: risk reduction of major adverse  cardiovascular events (cardiovascular death, non-fatal myocardial infarction (heart attack), or  non-fatal stroke), currently healthy BMI (23.06), coronary artery disease of native artery of  native heart with stable angina pectoris, atherosclerosis of aorta, bilateral carotid artery  stenosis, hypertension, hyperlipidemia, chronic obstructive pulmonary disease (COPD), and  former tobacco use. This is a Part D drug review. Part D drugs must meet medical standards for use. These are called  reasonable and necessary standards. They are based on who is likely to have more benefit than  risk by using a drug. Right now, you don't meet the standard(s) for use of this drug. The standard(s) to be met for this use is/are:  You are overweight OR obese.

## 2024-11-04 ENCOUNTER — Telehealth: Payer: Self-pay | Admitting: Pharmacist

## 2024-11-04 NOTE — Telephone Encounter (Signed)
 A request for an external review for the coverage of Wegovy  has been faxed to [phone] on 11/04/2024 @8 :37 am.  Appeal letter and all clinical documentation were included.  Thank you, Devere Pandy, PharmD Clinical Pharmacist  [place]  Direct Dial: [phone]

## 2024-11-09 ENCOUNTER — Other Ambulatory Visit (HOSPITAL_COMMUNITY): Payer: Self-pay

## 2024-11-11 ENCOUNTER — Other Ambulatory Visit: Payer: Self-pay | Admitting: Internal Medicine

## 2024-11-11 ENCOUNTER — Other Ambulatory Visit

## 2024-11-11 ENCOUNTER — Other Ambulatory Visit: Payer: Self-pay | Admitting: Family Medicine

## 2024-11-11 DIAGNOSIS — G43909 Migraine, unspecified, not intractable, without status migrainosus: Secondary | ICD-10-CM

## 2024-11-11 DIAGNOSIS — F419 Anxiety disorder, unspecified: Secondary | ICD-10-CM

## 2024-11-11 DIAGNOSIS — M1712 Unilateral primary osteoarthritis, left knee: Secondary | ICD-10-CM

## 2024-11-11 DIAGNOSIS — I25118 Atherosclerotic heart disease of native coronary artery with other forms of angina pectoris: Secondary | ICD-10-CM

## 2024-11-11 DIAGNOSIS — F0781 Postconcussional syndrome: Secondary | ICD-10-CM

## 2024-11-11 DIAGNOSIS — G8929 Other chronic pain: Secondary | ICD-10-CM

## 2024-11-11 DIAGNOSIS — L299 Pruritus, unspecified: Secondary | ICD-10-CM

## 2024-11-11 DIAGNOSIS — E785 Hyperlipidemia, unspecified: Secondary | ICD-10-CM

## 2024-11-11 DIAGNOSIS — I10 Essential (primary) hypertension: Secondary | ICD-10-CM

## 2024-11-12 NOTE — Telephone Encounter (Signed)
 Requested Prescriptions  Pending Prescriptions Disp Refills   losartan  (COZAAR ) 100 MG tablet [Pharmacy Med Name: LOSARTAN  POTASSIUM 100 MG TAB] 90 tablet 1    Sig: TAKE 1 TABLET BY MOUTH ONCE DAILY     Cardiovascular:  Angiotensin Receptor Blockers Failed - 11/12/2024 12:28 PM      Failed - K in normal range and within 180 days    Potassium  Date Value Ref Range Status  09/02/2024 3.3 (L) 3.5 - 5.3 mmol/L Final         Passed - Cr in normal range and within 180 days    Creat  Date Value Ref Range Status  09/02/2024 0.81 0.60 - 1.00 mg/dL Final         Passed - Patient is not pregnant      Passed - Last BP in normal range    BP Readings from Last 1 Encounters:  09/09/24 (!) 122/58         Passed - Valid encounter within last 6 months    Recent Outpatient Visits           2 months ago Annual physical exam   [place] [facility] [place], Marsa PARAS, DO   4 months ago Primary osteoarthritis of left knee   [place] [facility] [place], Marsa PARAS, DO   6 months ago Gastroesophageal reflux disease without esophagitis   [place] [facility] [place], Marsa PARAS, DO   7 months ago Coronary artery disease of native artery of native heart with stable angina pectoris   [place] [facility] [place] [facility]a PARAS, DO   11 months ago Atrophic vaginitis   [facility] [place], Marsa PARAS, DO       Future Appointments             In 1 month Wittenborn, Barnie, NP [place] [facility]             gabapentin  (NEURONTIN ) 100 MG capsule [Pharmacy Med Name: GABAPENTIN  100 MG CAP] 90 capsule 1    Sig: TAKE 1 CAPSULE BY MOUTH ONCE DAILY INCREASE 1 CAPSULE EVERY 2-3 DAYS IF TOLERATED UP TO 3 TIMES DAILY OR 3 CAPS @ BEDTIME     Neurology: Anticonvulsants - gabapentin  Passed - 11/12/2024 12:28 PM      Passed - Cr in normal range and within 360  days    Creat  Date Value Ref Range Status  09/02/2024 0.81 0.60 - 1.00 mg/dL Final         Passed - Completed PHQ-2 or PHQ-9 in the last 360 days      Passed - Valid encounter within last 12 months    Recent Outpatient Visits           2 months ago Annual physical exam   [place] [facility] [place], Marsa PARAS, DO   4 months ago Primary osteoarthritis of left knee   [place] [facility] [place], Marsa PARAS, DO   6 months ago Gastroesophageal reflux disease without esophagitis   [place] [facility] [place], Marsa PARAS, DO   7 months ago Coronary artery disease of native artery of native heart with stable angina pectoris   [facility] [place]AS, [place]   11 months ago Atrophic vaginitis   [place] [facility] [place], Marsa PARAS, [place]  Future Appointments             In 1 month Wittenborn, Information Systems Manager, NP [place] [facility]             rizatriptan  (MAXALT -MLT) 10 MG disintegrating tablet [Pharmacy Med Name: RIZATRIPTAN  BENZOATE 10 MG ODT] 10 tablet 0    Sig: DISSOLVE 1 TABLET ON THE TONGUE AS NEEDED FOR MIGRAINE. MAY REPEAT IN 2 HOURS IF NEEDED     Neurology:  Migraine Therapy - Triptan Passed - 11/12/2024 12:28 PM      Passed - Last BP in normal range    BP Readings from Last 1 Encounters:  09/09/24 (!) 122/58         Passed - Valid encounter within last 12 months    Recent Outpatient Visits           2 months ago Annual physical exam   [place] [facility] [place], Marsa PARAS, DO   4 months ago Primary osteoarthritis of left knee   [place] [facility] [place], Marsa PARAS, DO   6 months ago Gastroesophageal reflux disease without esophagitis   [place] [facility] [place], Marsa PARAS, DO   7 months ago Coronary artery disease of native  artery of native heart with stable angina pectoris   [place] [facility] [place] Marsa PARAS, DO   11 months ago Atrophic vaginitis   [place] [facility] [place], Marsa PARAS, DO       Future Appointments             In 1 month Wittenborn, Barnie, NP [place] [facility]             hydrOXYzine  (VISTARIL ) 25 MG capsule [Pharmacy Med Name: HYDROXYZINE  PAMOATE 25 MG CAP] 270 capsule 0    Sig: TAKE 1 CAPSULE BY MOUTH EVERY 8 HOURS ASNEEDED     Ear, Nose, and Throat:  Antihistamines 2 Passed - 11/12/2024 12:28 PM      Passed - Cr in normal range and within 360 days    Creat  Date Value Ref Range Status  09/02/2024 0.81 0.60 - 1.00 mg/dL Final         Passed - Valid encounter within last 12 months    Recent Outpatient Visits           2 months ago Annual physical exam   [place], Marsa PARAS, DO   4 months ago Primary osteoarthritis of left knee   [place] [facility] [place], Marsa PARAS, DO   6 months ago Gastroesophageal reflux disease without esophagitis   [place] [facility] [place], Marsa PARAS, DO   7 months ago Coronary artery disease of native artery of native heart with stable angina pectoris   [place] [facility] [place] Marsa PARAS, DO   11 months ago Atrophic vaginitis    [facility] [place], Marsa PARAS, DO       Future Appointments             In 1 month Wittenborn, Barnie, NP  [facility]             busPIRone  (BUSPAR ) 5 MG tablet [Pharmacy Med Name: BUSPIRONE  HCL 5 MG TAB] 180 tablet 1    Sig: TAKE 1 TABLET BY MOUTH TWICE DAILY     Psychiatry: Anxiolytics/Hypnotics -  Non-controlled Passed - 11/12/2024 12:28 PM      Passed - Valid encounter within last 12 months    Recent Outpatient Visits           2 months ago  Annual physical exam   [facility] [place], Marsa PARAS, DO   4 months ago Primary osteoarthritis of left knee   [place] [facility] [place], Marsa PARAS, DO   6 months ago Gastroesophageal reflux disease without esophagitis   [place] [facility] [place], Marsa PARAS, DO   7 months ago Coronary artery disease of native artery of native heart with stable angina pectoris   [place] [facility] [place] Marsa PARAS, DO   11 months ago Atrophic vaginitis   [place] [facility] [place], Marsa PARAS, DO       Future Appointments             In 1 month Wittenborn, Barnie, NP [place] [facility]             sertraline  (ZOLOFT ) 100 MG tablet [Pharmacy Med Name: SERTRALINE  HCL 100 MG TAB] 90 tablet 1    Sig: TAKE 1 TABLET BY MOUTH ONCE DAILY     Psychiatry:  Antidepressants - SSRI - sertraline  Passed - 11/12/2024 12:28 PM      Passed - AST in normal range and within 360 days    AST  Date Value Ref Range Status  09/02/2024 13 10 - 35 U/L Final         Passed - ALT in normal range and within 360 days    ALT  Date Value Ref Range Status  09/02/2024 10 6 - 29 U/L Final         Passed - Completed PHQ-2 or PHQ-9 in the last 360 days      Passed - Valid encounter within last 6 months    Recent Outpatient Visits           2 months ago Annual physical exam   [place] [facility] [place], Marsa PARAS, DO   4 months ago Primary osteoarthritis of left knee   [place] [facility] [place], Marsa PARAS, DO   6 months ago Gastroesophageal reflux disease without esophagitis   [place] [facility] [place], Marsa PARAS, DO   7 months ago Coronary artery disease of native artery of native heart with stable angina pectoris   [place] [facility] [place] Marsa PARAS, DO   11 months ago Atrophic vaginitis   [place] [facility] [place], Marsa PARAS, DO       Future Appointments             In 1 month Wittenborn, Barnie, NP [place] [facility]             amLODipine  (NORVASC ) 10 MG tablet [Pharmacy Med Name: AMLODIPINE  BESYLATE 10 MG TAB] 90 tablet 1    Sig: TAKE 1 TABLET BY MOUTH ONCE DAILY     Cardiovascular: Calcium  Channel Blockers 2 Passed - 11/12/2024 12:28 PM      Passed - Last BP in normal range    BP Readings from Last 1 Encounters:  09/09/24 (!) 122/58         Passed - Last Heart Rate in normal range    Pulse Readings from Last 1 Encounters:  09/09/24 74  Passed - Valid encounter within last 6 months    Recent Outpatient Visits           2 months ago Annual physical exam   [place] [facility] [place], Marsa PARAS, DO   4 months ago Primary osteoarthritis of left knee   [place] [facility] [place], Marsa PARAS, DO   6 months ago Gastroesophageal reflux disease without esophagitis   [place] [facility] [place], Marsa PARAS, DO   7 months ago Coronary artery disease of native artery of native heart with stable angina pectoris   [facility] [place] Marsa PARAS, DO   11 months ago Atrophic vaginitis   [place] [facility] [place] Marsa PARAS, DO       Future Appointments             In 1 month Wittenborn, Barnie, NP [facility] [place]

## 2024-11-16 ENCOUNTER — Other Ambulatory Visit

## 2024-11-17 ENCOUNTER — Other Ambulatory Visit (HOSPITAL_COMMUNITY): Payer: Self-pay

## 2024-11-20 NOTE — Telephone Encounter (Signed)
 Received fax today with Medicare Part D Denial on the Wegovy  appeal.  The reasoning stated essentially states that Medicare Part D does not cover Wegovy  for Heart Disease with her current BMI being low at 23.06.   I checked into Part D requirement and it does state that CAD + Overweight or Obese BMI is required.  Marsa Officer, DO [facility] 11/20/2024, 5:19 PM

## 2024-11-27 ENCOUNTER — Other Ambulatory Visit: Admitting: Pharmacist

## 2024-11-27 DIAGNOSIS — I25118 Atherosclerotic heart disease of native coronary artery with other forms of angina pectoris: Secondary | ICD-10-CM

## 2024-11-27 NOTE — Patient Instructions (Signed)
 Thank you for taking the time to review the Repatha  injection technique with me today.    The following are the web addresses for the how to use video and reference guide that we discussed.   Video:   https://www.repatha .com/how-to-start-repatha -injection   Reference Guide:   https://www.pi.amgen.com/-/media/Project/Amgen/Repository/pi-amgen-com/Repatha /repatha_rg_english.pdf   Please review these and let me know if you have any questions!     Sharyle Sia, PharmD, [facility] Clinical Pharmacist [facility] [phone]

## 2024-11-27 NOTE — Progress Notes (Signed)
" ° ° ° °  11/27/2024 Name: Kristin Parker MRN: [id number] DOB: 03-Jan-1951   Kristin Parker is a 74 y.o. year old female who presented for a telephone visit.   They were referred to the pharmacist by their PCP for assistance in managing medication adherence to Repatha .    Today patient reports maintaining improved adherence to Repatha , now injecting Repatha  140 mg SureClick every other Friday (plans to give next dose today)   Reports using calendar as adherence tool to help with reminders to take Repatha  injection every OTHER Friday.    Review Repatha  administration technique. Reports improvement in technique, including waiting 30 minutes after taking autoinjector from refrigerator before giving injection and pushing firmly against skin, press grey button and continue pushing for ~15 seconds and until hears second click.   Today patient also has questions regarding recent prior authorization denial for Wegovy  from her Medicare prescription plan - Patient previously taking Wegovy  for risk reduction of major adverse cardiovascular events/weight management. However, latest prior authorization for continuation of Wegovy  was denied by her insurance as lastest BMI was 23.06 kg/m per weight taken at Office Visit on 09/09/2024 (no longer overweight).  - Today patient states that she has noticed an increase in her weight since stopped Wegovy . Recalls that when last checked at home weight was in 150s, but does not recall specific number. - Patient interested in restarting Wegovy  in the future if needed/if eligible again in future through her prescription coverage  Provide dietary counseling today including education on focus on lean proteins, fruits and vegetables, whole grains and increased fiber consumption, adequate hydration  Patient notes that her physical activity is currently limited by recent shoulder surgery (on 11/11/2024); currently attending physical therapy once weekly     Follow Up Plan:     Fasting lipid panel lab scheduled for 12/07/2024 at 8:45 AM  Clinical Pharmacist will follow up with patient by telephone on 12/28/2024 at 10:00 AM       Sharyle Sia, PharmD, [facility] Clinical Pharmacist [facility] [phone] "

## 2024-12-07 ENCOUNTER — Other Ambulatory Visit

## 2024-12-07 DIAGNOSIS — I25118 Atherosclerotic heart disease of native coronary artery with other forms of angina pectoris: Secondary | ICD-10-CM

## 2024-12-25 ENCOUNTER — Ambulatory Visit: Admitting: Student

## 2024-12-28 ENCOUNTER — Other Ambulatory Visit

## 2025-03-09 ENCOUNTER — Ambulatory Visit: Admitting: Family Medicine
